# Patient Record
Sex: Female | Born: 1970 | Race: White | Hispanic: No | Marital: Married | Smoking: Never smoker
Health system: Southern US, Community
[De-identification: ages and names within clinical notes are randomized; demographics above are authoritative.]

## PROBLEM LIST (undated history)

## (undated) DIAGNOSIS — G629 Polyneuropathy, unspecified: Secondary | ICD-10-CM

## (undated) DIAGNOSIS — E669 Obesity, unspecified: Secondary | ICD-10-CM

## (undated) DIAGNOSIS — E785 Hyperlipidemia, unspecified: Secondary | ICD-10-CM

## (undated) DIAGNOSIS — G709 Myoneural disorder, unspecified: Secondary | ICD-10-CM

## (undated) DIAGNOSIS — B019 Varicella without complication: Secondary | ICD-10-CM

## (undated) DIAGNOSIS — Z0000 Encounter for general adult medical examination without abnormal findings: Secondary | ICD-10-CM

## (undated) DIAGNOSIS — J329 Chronic sinusitis, unspecified: Secondary | ICD-10-CM

## (undated) DIAGNOSIS — E782 Mixed hyperlipidemia: Secondary | ICD-10-CM

## (undated) DIAGNOSIS — E11319 Type 2 diabetes mellitus with unspecified diabetic retinopathy without macular edema: Secondary | ICD-10-CM

## (undated) DIAGNOSIS — J019 Acute sinusitis, unspecified: Secondary | ICD-10-CM

## (undated) DIAGNOSIS — T7840XA Allergy, unspecified, initial encounter: Secondary | ICD-10-CM

## (undated) DIAGNOSIS — N946 Dysmenorrhea, unspecified: Secondary | ICD-10-CM

## (undated) DIAGNOSIS — J45909 Unspecified asthma, uncomplicated: Secondary | ICD-10-CM

## (undated) DIAGNOSIS — I10 Essential (primary) hypertension: Secondary | ICD-10-CM

## (undated) HISTORY — DX: Myoneural disorder, unspecified: G70.9

## (undated) HISTORY — DX: Dysmenorrhea, unspecified: N94.6

## (undated) HISTORY — DX: Encounter for general adult medical examination without abnormal findings: Z00.00

## (undated) HISTORY — DX: Mixed hyperlipidemia: E78.2

## (undated) HISTORY — PX: EYE SURGERY: SHX253

## (undated) HISTORY — DX: Hyperlipidemia, unspecified: E78.5

## (undated) HISTORY — DX: Unspecified asthma, uncomplicated: J45.909

## (undated) HISTORY — DX: Obesity, unspecified: E66.9

## (undated) HISTORY — DX: Chronic sinusitis, unspecified: J32.9

## (undated) HISTORY — DX: Essential (primary) hypertension: I10

## (undated) HISTORY — DX: Acute sinusitis, unspecified: J01.90

## (undated) HISTORY — DX: Varicella without complication: B01.9

## (undated) HISTORY — DX: Allergy, unspecified, initial encounter: T78.40XA

## (undated) HISTORY — DX: Polyneuropathy, unspecified: G62.9

## (undated) HISTORY — DX: Type 2 diabetes mellitus with unspecified diabetic retinopathy without macular edema: E11.319

---

## 2011-03-04 ENCOUNTER — Emergency Department (HOSPITAL_BASED_OUTPATIENT_CLINIC_OR_DEPARTMENT_OTHER)
Admission: EM | Admit: 2011-03-04 | Discharge: 2011-03-05 | Disposition: A | Discharge status: Home / Self Care | Payer: BC Managed Care – PPO | Attending: Emergency Medicine | Admitting: Emergency Medicine

## 2011-03-04 DIAGNOSIS — R21 Rash and other nonspecific skin eruption: Secondary | ICD-10-CM | POA: Insufficient documentation

## 2011-03-04 LAB — DIFFERENTIAL
Eosinophils Absolute: 0.2 10*3/uL (ref 0.0–0.7)
Eosinophils Relative: 2 % (ref 0–5)
Lymphs Abs: 3.1 10*3/uL (ref 0.7–4.0)
Monocytes Absolute: 0.6 10*3/uL (ref 0.1–1.0)
Monocytes Relative: 7 % (ref 3–12)

## 2011-03-04 LAB — URINALYSIS, ROUTINE W REFLEX MICROSCOPIC
Glucose, UA: >1000 mg/dL — AB
Ketones, ur: NEGATIVE mg/dL
Leukocytes, UA: NEGATIVE
Protein, ur: NEGATIVE mg/dL
pH: 5.5 (ref 5.0–8.0)

## 2011-03-04 LAB — BASIC METABOLIC PANEL
BUN: 14 mg/dL (ref 6–23)
CO2: 28 mEq/L (ref 19–32)
Chloride: 101 mEq/L (ref 96–112)
Creatinine, Ser: 0.5 mg/dL (ref 0.4–1.2)
Glucose, Bld: 295 mg/dL — ABNORMAL HIGH (ref 70–99)
Potassium: 4.3 mEq/L (ref 3.5–5.1)

## 2011-03-04 LAB — CBC
HCT: 45.1 % (ref 36.0–46.0)
MCH: 30.8 pg (ref 26.0–34.0)
MCHC: 35.3 g/dL (ref 30.0–36.0)
MCV: 87.4 fL (ref 78.0–100.0)
Platelets: 253 10*3/uL (ref 150–400)
RDW: 11.9 % (ref 11.5–15.5)
WBC: 9.9 10*3/uL (ref 4.0–10.5)

## 2011-03-04 LAB — URINE MICROSCOPIC-ADD ON

## 2012-08-12 ENCOUNTER — Encounter (INDEPENDENT_AMBULATORY_CARE_PROVIDER_SITE_OTHER): Payer: BC Managed Care – PPO | Admitting: Ophthalmology

## 2012-08-12 DIAGNOSIS — E11319 Type 2 diabetes mellitus with unspecified diabetic retinopathy without macular edema: Secondary | ICD-10-CM

## 2012-08-12 DIAGNOSIS — H3581 Retinal edema: Secondary | ICD-10-CM

## 2012-08-12 DIAGNOSIS — H2510 Age-related nuclear cataract, unspecified eye: Secondary | ICD-10-CM

## 2012-08-12 DIAGNOSIS — E1139 Type 2 diabetes mellitus with other diabetic ophthalmic complication: Secondary | ICD-10-CM

## 2012-08-12 DIAGNOSIS — E1169 Type 2 diabetes mellitus with other specified complication: Secondary | ICD-10-CM | POA: Insufficient documentation

## 2012-08-12 DIAGNOSIS — E669 Obesity, unspecified: Secondary | ICD-10-CM | POA: Insufficient documentation

## 2012-08-12 DIAGNOSIS — H43819 Vitreous degeneration, unspecified eye: Secondary | ICD-10-CM

## 2012-08-12 HISTORY — PX: REFRACTIVE SURGERY: SHX103

## 2012-08-20 ENCOUNTER — Ambulatory Visit (INDEPENDENT_AMBULATORY_CARE_PROVIDER_SITE_OTHER): Payer: BC Managed Care – PPO | Admitting: Family Medicine

## 2012-08-20 ENCOUNTER — Encounter: Payer: Self-pay | Admitting: Family Medicine

## 2012-08-20 VITALS — BP 182/107 | HR 87 | Temp 97.8°F | Ht 67.5 in | Wt 261.0 lb

## 2012-08-20 DIAGNOSIS — N946 Dysmenorrhea, unspecified: Secondary | ICD-10-CM

## 2012-08-20 DIAGNOSIS — E1139 Type 2 diabetes mellitus with other diabetic ophthalmic complication: Secondary | ICD-10-CM

## 2012-08-20 DIAGNOSIS — H579 Unspecified disorder of eye and adnexa: Secondary | ICD-10-CM

## 2012-08-20 DIAGNOSIS — Z0000 Encounter for general adult medical examination without abnormal findings: Secondary | ICD-10-CM

## 2012-08-20 DIAGNOSIS — Z23 Encounter for immunization: Secondary | ICD-10-CM

## 2012-08-20 DIAGNOSIS — E119 Type 2 diabetes mellitus without complications: Secondary | ICD-10-CM

## 2012-08-20 DIAGNOSIS — E669 Obesity, unspecified: Secondary | ICD-10-CM | POA: Insufficient documentation

## 2012-08-20 DIAGNOSIS — I10 Essential (primary) hypertension: Secondary | ICD-10-CM | POA: Insufficient documentation

## 2012-08-20 DIAGNOSIS — T7840XA Allergy, unspecified, initial encounter: Secondary | ICD-10-CM | POA: Insufficient documentation

## 2012-08-20 DIAGNOSIS — E11319 Type 2 diabetes mellitus with unspecified diabetic retinopathy without macular edema: Secondary | ICD-10-CM

## 2012-08-20 HISTORY — DX: Dysmenorrhea, unspecified: N94.6

## 2012-08-20 HISTORY — DX: Essential (primary) hypertension: I10

## 2012-08-20 HISTORY — DX: Encounter for general adult medical examination without abnormal findings: Z00.00

## 2012-08-20 LAB — CBC
HCT: 44.9 % (ref 36.0–46.0)
Hemoglobin: 15 g/dL (ref 12.0–15.0)
RBC: 4.94 Mil/uL (ref 3.87–5.11)
RDW: 12.2 % (ref 11.5–14.6)
WBC: 7.5 10*3/uL (ref 4.5–10.5)

## 2012-08-20 LAB — HEMOGLOBIN A1C: Hgb A1c MFr Bld: 9.9 % — ABNORMAL HIGH (ref 4.6–6.5)

## 2012-08-20 LAB — HEPATIC FUNCTION PANEL
ALT: 26 U/L (ref 0–35)
AST: 27 U/L (ref 0–37)
Alkaline Phosphatase: 77 U/L (ref 39–117)
Total Bilirubin: 0.9 mg/dL (ref 0.3–1.2)

## 2012-08-20 LAB — RENAL FUNCTION PANEL
BUN: 7 mg/dL (ref 6–23)
CO2: 30 mEq/L (ref 19–32)
Calcium: 9.3 mg/dL (ref 8.4–10.5)
Chloride: 101 mEq/L (ref 96–112)
Creatinine, Ser: 0.6 mg/dL (ref 0.4–1.2)
GFR: 118.76 mL/min (ref >60.00)
Sodium: 138 mEq/L (ref 135–145)

## 2012-08-20 LAB — LIPID PANEL
HDL: 27.7 mg/dL — ABNORMAL LOW (ref >39.00)
LDL Cholesterol: 109 mg/dL — ABNORMAL HIGH (ref 0–99)
Total CHOL/HDL Ratio: 6
VLDL: 27.4 mg/dL (ref 0.0–40.0)

## 2012-08-20 LAB — MICROALBUMIN / CREATININE URINE RATIO: Microalb, Ur: 13 mg/dL — ABNORMAL HIGH (ref 0.0–1.9)

## 2012-08-20 MED ORDER — ONETOUCH LANCETS MISC: Status: DC

## 2012-08-20 MED ORDER — TETANUS-DIPHTH-ACELL PERTUSSIS 5-2.5-18.5 LF-MCG/0.5 IM SUSP: 0.5000 mL | Freq: Once | INTRAMUSCULAR | Status: DC

## 2012-08-20 MED ORDER — FLUTICASONE PROPIONATE 50 MCG/ACT NA SUSP: 2.0000 | Freq: Every day | NASAL | Status: DC

## 2012-08-20 MED ORDER — GLUCOSE BLOOD VI STRP: 1.0000 | ORAL_STRIP | Freq: Two times a day (BID) | Status: DC | PRN

## 2012-08-20 MED ORDER — ONETOUCH BASIC SYSTEM W/DEVICE KIT: PACK | Status: DC

## 2012-08-20 MED ORDER — CETIRIZINE HCL 10 MG PO TABS: 10.0000 mg | ORAL_TABLET | Freq: Every day | ORAL | Status: DC | PRN

## 2012-08-20 MED ORDER — METFORMIN HCL 500 MG PO TABS: 500.0000 mg | ORAL_TABLET | Freq: Two times a day (BID) | ORAL | Status: DC

## 2012-08-20 MED ORDER — LISINOPRIL 10 MG PO TABS: 10.0000 mg | ORAL_TABLET | Freq: Every day | ORAL | Status: DC

## 2012-08-20 MED ORDER — LANCETS 28G MISC: Status: DC

## 2012-08-20 NOTE — Assessment & Plan Note (Signed)
Start Lisinopril daily and reassess at next visit

## 2012-08-20 NOTE — Assessment & Plan Note (Signed)
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 She will continue her care there at [facility]

## 2012-08-20 NOTE — Addendum Note (Signed)
Addended by: Court Joy on: 08/20/2012 02:06 PM   Modules accepted: Orders

## 2012-08-20 NOTE — Assessment & Plan Note (Signed)
Patient has never been on meds or checked her sugars, she is given a meter and log today as well as Transport planner on diet and lifestyle she will monitor sugars, bis prior to breakfast and after dinner and prn for any concerning s/s. Check [id number] today and start Metformin 500 mg bid, minimize simple carbs.

## 2012-08-20 NOTE — Assessment & Plan Note (Signed)
Ibuprofen 400 mg prn menses

## 2012-08-20 NOTE — Assessment & Plan Note (Signed)
Started on Flonase and Zyrtec daily and encouraged nasal saline prn.

## 2012-08-20 NOTE — Assessment & Plan Note (Signed)
Given Tdap today, fasting labs ordered, MGM ordered. Pap scheduled for next visit. Has not had a pap since 41yo and no MGM

## 2012-08-20 NOTE — Assessment & Plan Note (Signed)
Consider DASH diet, increase exercise and avoid simple carbs.

## 2012-08-20 NOTE — Patient Instructions (Addendum)
Diabetes and Exercise Regular exercise is important and can help:   Control blood glucose (sugar).   Decrease blood pressure.    Control blood lipids (cholesterol, triglycerides).   Improve overall health.  BENEFITS FROM EXERCISE  Improved fitness.   Improved flexibility.   Improved endurance.   Increased bone density.   Weight control.   Increased muscle strength.   Decreased body fat.   Improvement of the body's use of insulin, a hormone.   Increased insulin sensitivity.   Reduction of insulin needs.   Reduced stress and tension.   Helps you feel better.  People with diabetes who add exercise to their lifestyle gain additional benefits, including:  Weight loss.   Reduced appetite.   Improvement of the body's use of blood glucose.   Decreased risk factors for heart disease:   Lowering of cholesterol and triglycerides.   Raising the level of good cholesterol (high-density lipoproteins, HDL).   Lowering blood sugar.   Decreased blood pressure.  TYPE 1 DIABETES AND EXERCISE  Exercise will usually lower your blood glucose.   If blood glucose is greater than 240 mg/dl, check urine ketones. If ketones are present, do not exercise.   Location of the insulin injection sites may need to be adjusted with exercise. Avoid injecting insulin into areas of the body that will be exercised. For example, avoid injecting insulin into:   The arms when playing tennis.   The legs when jogging. For more information, discuss this with your caregiver.   Keep a record of:   Food intake.   Type and amount of exercise.   Expected peak times of insulin action.   Blood glucose levels.  Do this before, during, and after exercise. Review your records with your caregiver. This will help you to develop guidelines for adjusting food intake and insulin amounts.  TYPE 2 DIABETES AND EXERCISE  Regular physical activity can help control blood glucose.   Exercise is important  because it may:   Increase the body's sensitivity to insulin.   Improve blood glucose control.   Exercise reduces the risk of heart disease. It decreases serum cholesterol and triglycerides. It also lowers blood pressure.   Those who take insulin or oral hypoglycemic agents should watch for signs of hypoglycemia. These signs include dizziness, shaking, sweating, chills, and confusion.   Body water is lost during exercise. It must be replaced. This will help to avoid loss of body fluids (dehydration) or heat stroke.  Be sure to talk to your caregiver before starting an exercise program to make sure it is safe for you. Remember, any activity is better than none.  Document Released: 03/06/2004 Document Revised: 12/04/2011 Document Reviewed: 06/21/2009 [facility] Patient Information 2012 [place], [place].  Preventive Care for Adults, Female A healthy lifestyle and preventive care can promote health and wellness. Preventive health guidelines for women include the following key practices.  A routine yearly physical is a good way to check with your caregiver about your health and preventive screening. It is a chance to share any concerns and updates on your health, and to receive a thorough exam.   Visit your dentist for a routine exam and preventive care every 6 months. Brush your teeth twice a day and floss once a day. Good oral hygiene prevents tooth decay and gum disease.   The frequency of eye exams is based on your age, health, family medical history, use of contact lenses, and other factors. Follow your caregiver's recommendations for frequency of eye exams.  Eat a healthy diet. Foods like vegetables, fruits, whole grains, low-fat dairy products, and lean protein foods contain the nutrients you need without too many calories. Decrease your intake of foods high in solid fats, added sugars, and salt. Eat the right amount of calories for you.Get information about a proper diet from your  caregiver, if necessary.   Regular physical exercise is one of the most important things you can do for your health. Most adults should get at least 150 minutes of moderate-intensity exercise (any activity that increases your heart rate and causes you to sweat) each week. In addition, most adults need muscle-strengthening exercises on 2 or more days a week.   Maintain a healthy weight. The body mass index (BMI) is a screening tool to identify possible weight problems. It provides an estimate of body fat based on height and weight. Your caregiver can help determine your BMI, and can help you achieve or maintain a healthy weight.For adults 20 years and older:   A BMI below 18.5 is considered underweight.   A BMI of 18.5 to 24.9 is normal.   A BMI of 25 to 29.9 is considered overweight.   A BMI of 30 and above is considered obese.   Maintain normal blood lipids and cholesterol levels by exercising and minimizing your intake of saturated fat. Eat a balanced diet with plenty of fruit and vegetables. Blood tests for lipids and cholesterol should begin at age 93 and be repeated every 5 years. If your lipid or cholesterol levels are high, you are over 50, or you are at high risk for heart disease, you may need your cholesterol levels checked more frequently.Ongoing high lipid and cholesterol levels should be treated with medicines if diet and exercise are not effective.   If you smoke, find out from your caregiver how to quit. If you do not use tobacco, do not start.   If you are pregnant, do not drink alcohol. If you are breastfeeding, be very cautious about drinking alcohol. If you are not pregnant and choose to drink alcohol, do not exceed 1 drink per day. One drink is considered to be 12 ounces (355 mL) of beer, 5 ounces (148 mL) of wine, or 1.5 ounces (44 mL) of liquor.   Avoid use of street drugs. Do not share needles with anyone. Ask for help if you need support or instructions about stopping  the use of drugs.   High blood pressure causes heart disease and increases the risk of stroke. Your blood pressure should be checked at least every 1 to 2 years. Ongoing high blood pressure should be treated with medicines if weight loss and exercise are not effective.   If you are 69 to 42 years old, ask your caregiver if you should take aspirin to prevent strokes.   Diabetes screening involves taking a blood sample to check your fasting blood sugar level. This should be done once every 3 years, after age 63, if you are within normal weight and without risk factors for diabetes. Testing should be considered at a younger age or be carried out more frequently if you are overweight and have at least 1 risk factor for diabetes.   Breast cancer screening is essential preventive care for women. You should practice "breast self-awareness." This means understanding the normal appearance and feel of your breasts and may include breast self-examination. Any changes detected, no matter how small, should be reported to a caregiver. Women in their 3s and 30s should have a  clinical breast exam (CBE) by a caregiver as part of a regular health exam every 1 to 3 years. After age 47, women should have a CBE every year. Starting at age 57, women should consider having a mammography (breast X-ray test) every year. Women who have a family history of breast cancer should talk to their caregiver about genetic screening. Women at a high risk of breast cancer should talk to their caregivers about having magnetic resonance imaging (MRI) and a mammography every year.   The Pap test is a screening test for cervical cancer. A Pap test can show cell changes on the cervix that might become cervical cancer if left untreated. A Pap test is a procedure in which cells are obtained and examined from the lower end of the uterus (cervix).   Women should have a Pap test starting at age 72.   Between ages 78 and 35, Pap tests should be  repeated every 2 years.   Beginning at age 85, you should have a Pap test every 3 years as long as the past 3 Pap tests have been normal.   Some women have medical problems that increase the chance of getting cervical cancer. Talk to your caregiver about these problems. It is especially important to talk to your caregiver if a new problem develops soon after your last Pap test. In these cases, your caregiver may recommend more frequent screening and Pap tests.   The above recommendations are the same for women who have or have not gotten the vaccine for human papillomavirus (HPV).   If you had a hysterectomy for a problem that was not cancer or a condition that could lead to cancer, then you no longer need Pap tests. Even if you no longer need a Pap test, a regular exam is a good idea to make sure no other problems are starting.   If you are between ages 48 and 30, and you have had normal Pap tests going back 10 years, you no longer need Pap tests. Even if you no longer need a Pap test, a regular exam is a good idea to make sure no other problems are starting.   If you have had past treatment for cervical cancer or a condition that could lead to cancer, you need Pap tests and screening for cancer for at least 20 years after your treatment.   If Pap tests have been discontinued, risk factors (such as a new sexual partner) need to be reassessed to determine if screening should be resumed.   The HPV test is an additional test that may be used for cervical cancer screening. The HPV test looks for the virus that can cause the cell changes on the cervix. The cells collected during the Pap test can be tested for HPV. The HPV test could be used to screen women aged 58 years and older, and should be used in women of any age who have unclear Pap test results. After the age of 1, women should have HPV testing at the same frequency as a Pap test.   Colorectal cancer can be detected and often prevented. Most  routine colorectal cancer screening begins at the age of 68 and continues through age 16. However, your caregiver may recommend screening at an earlier age if you have risk factors for colon cancer. On a yearly basis, your caregiver may provide home test kits to check for hidden blood in the stool. Use of a small camera at the end of a tube,  to directly examine the colon (sigmoidoscopy or colonoscopy), can detect the earliest forms of colorectal cancer. Talk to your caregiver about this at age 27, when routine screening begins. Direct examination of the colon should be repeated every 5 to 10 years through age 45, unless early forms of pre-cancerous polyps or small growths are found.   Hepatitis C blood testing is recommended for all people born from [birth year] through 1965 and any individual with known risks for hepatitis C.   Practice safe sex. Use condoms and avoid high-risk sexual practices to reduce the spread of sexually transmitted infections (STIs). STIs include gonorrhea, chlamydia, syphilis, trichomonas, herpes, HPV, and human immunodeficiency virus (HIV). Herpes, HIV, and HPV are viral illnesses that have no cure. They can result in disability, cancer, and death. Sexually active women aged 78 and younger should be checked for chlamydia. Older women with new or multiple partners should also be tested for chlamydia. Testing for other STIs is recommended if you are sexually active and at increased risk.   Osteoporosis is a disease in which the bones lose minerals and strength with aging. This can result in serious bone fractures. The risk of osteoporosis can be identified using a bone density scan. Women ages 2 and over and women at risk for fractures or osteoporosis should discuss screening with their caregivers. Ask your caregiver whether you should take a calcium supplement or vitamin D to reduce the rate of osteoporosis.   Menopause can be associated with physical symptoms and risks. Hormone  replacement therapy is available to decrease symptoms and risks. You should talk to your caregiver about whether hormone replacement therapy is right for you.   Use sunscreen with sun protection factor (SPF) of 30 or more. Apply sunscreen liberally and repeatedly throughout the day. You should seek shade when your shadow is shorter than you. Protect yourself by wearing long sleeves, pants, a wide-brimmed hat, and sunglasses year round, whenever you are outdoors.   Once a month, do a whole body skin exam, using a mirror to look at the skin on your back. Notify your caregiver of new moles, moles that have irregular borders, moles that are larger than a pencil eraser, or moles that have changed in shape or color.   Stay current with required immunizations.   Influenza. You need a dose every fall (or winter). The composition of the flu vaccine changes each year, so being vaccinated once is not enough.   Pneumococcal polysaccharide. You need 1 to 2 doses if you smoke cigarettes or if you have certain chronic medical conditions. You need 1 dose at age 36 (or older) if you have never been vaccinated.   Tetanus, diphtheria, pertussis (Tdap, Td). Get 1 dose of Tdap vaccine if you are younger than age 44, are over 53 and have contact with an infant, are a Research scientist (physical sciences), are pregnant, or simply want to be protected from whooping cough. After that, you need a Td booster dose every 10 years. Consult your caregiver if you have not had at least 3 tetanus and diphtheria-containing shots sometime in your life or have a deep or dirty wound.   HPV. You need this vaccine if you are a woman age 23 or younger. The vaccine is given in 3 doses over 6 months.   Measles, mumps, rubella (MMR). You need at least 1 dose of MMR if you were born in 1957 or later. You may also need a second dose.   Meningococcal. If you are age 36 to  21 and a Orthoptist living in a residence hall, or have one of several  medical conditions, you need to get vaccinated against meningococcal disease. You may also need additional booster doses.   Zoster (shingles). If you are age 29 or older, you should get this vaccine.   Varicella (chickenpox). If you have never had chickenpox or you were vaccinated but received only 1 dose, talk to your caregiver to find out if you need this vaccine.   Hepatitis A. You need this vaccine if you have a specific risk factor for hepatitis A virus infection or you simply wish to be protected from this disease. The vaccine is usually given as 2 doses, 6 to 18 months apart.   Hepatitis B. You need this vaccine if you have a specific risk factor for hepatitis B virus infection or you simply wish to be protected from this disease. The vaccine is given in 3 doses, usually over 6 months.  Preventive Services / Frequency Ages 73 to 52  Blood pressure check.** / Every 1 to 2 years.   Lipid and cholesterol check.** / Every 5 years beginning at age 37.   Clinical breast exam.** / Every 3 years for women in their 63s and 30s.   Pap test.** / Every 2 years from ages 71 through 78. Every 3 years starting at age 86 through age 57 or 18 with a history of 3 consecutive normal Pap tests.   HPV screening.** / Every 3 years from ages 1 through ages 49 to 80 with a history of 3 consecutive normal Pap tests.   Hepatitis C blood test.** / For any individual with known risks for hepatitis C.   Skin self-exam. / Monthly.   Influenza immunization.** / Every year.   Pneumococcal polysaccharide immunization.** / 1 to 2 doses if you smoke cigarettes or if you have certain chronic medical conditions.   Tetanus, diphtheria, pertussis (Tdap, Td) immunization. / A one-time dose of Tdap vaccine. After that, you need a Td booster dose every 10 years.   HPV immunization. / 3 doses over 6 months, if you are 40 and younger.   Measles, mumps, rubella (MMR) immunization. / You need at least 1 dose of MMR if  you were born in 1957 or later. You may also need a second dose.   Meningococcal immunization. / 1 dose if you are age 65 to 40 and a first-year college student living in a residence hall, or have one of several medical conditions, you need to get vaccinated against meningococcal disease. You may also need additional booster doses.   Varicella immunization.** / Consult your caregiver.   Hepatitis A immunization.** / Consult your caregiver. 2 doses, 6 to 18 months apart.   Hepatitis B immunization.** / Consult your caregiver. 3 doses usually over 6 months.  Ages 64 to 39  Blood pressure check.** / Every 1 to 2 years.   Lipid and cholesterol check.** / Every 5 years beginning at age 3.   Clinical breast exam.** / Every year after age 77.   Mammogram.** / Every year beginning at age 11 and continuing for as long as you are in good health. Consult with your caregiver.   Pap test.** / Every 3 years starting at age 8 through age 78 or 88 with a history of 3 consecutive normal Pap tests.   HPV screening.** / Every 3 years from ages 26 through ages 86 to 20 with a history of 3 consecutive normal Pap tests.  Fecal occult blood test (FOBT) of stool. / Every year beginning at age 21 and continuing until age 67. You may not need to do this test if you get a colonoscopy every 10 years.   Flexible sigmoidoscopy or colonoscopy.** / Every 5 years for a flexible sigmoidoscopy or every 10 years for a colonoscopy beginning at age 46 and continuing until age 48.   Hepatitis C blood test.** / For all people born from [birth year] through 1966 and any individual with known risks for hepatitis C.   Skin self-exam. / Monthly.   Influenza immunization.** / Every year.   Pneumococcal polysaccharide immunization.** / 1 to 2 doses if you smoke cigarettes or if you have certain chronic medical conditions.   Tetanus, diphtheria, pertussis (Tdap, Td) immunization.** / A one-time dose of Tdap vaccine. After that, you  need a Td booster dose every 10 years.   Measles, mumps, rubella (MMR) immunization. / You need at least 1 dose of MMR if you were born in 1957 or later. You may also need a second dose.   Varicella immunization.** / Consult your caregiver.   Meningococcal immunization.** / Consult your caregiver.   Hepatitis A immunization.** / Consult your caregiver. 2 doses, 6 to 18 months apart.   Hepatitis B immunization.** / Consult your caregiver. 3 doses, usually over 6 months.  Ages 55 and over  Blood pressure check.** / Every 1 to 2 years.   Lipid and cholesterol check.** / Every 5 years beginning at age 95.   Clinical breast exam.** / Every year after age 61.   Mammogram.** / Every year beginning at age 75 and continuing for as long as you are in good health. Consult with your caregiver.   Pap test.** / Every 3 years starting at age 71 through age 29 or 15 with a 3 consecutive normal Pap tests. Testing can be stopped between 65 and 70 with 3 consecutive normal Pap tests and no abnormal Pap or HPV tests in the past 10 years.   HPV screening.** / Every 3 years from ages 18 through ages 34 or 76 with a history of 3 consecutive normal Pap tests. Testing can be stopped between 65 and 70 with 3 consecutive normal Pap tests and no abnormal Pap or HPV tests in the past 10 years.   Fecal occult blood test (FOBT) of stool. / Every year beginning at age 55 and continuing until age 74. You may not need to do this test if you get a colonoscopy every 10 years.   Flexible sigmoidoscopy or colonoscopy.** / Every 5 years for a flexible sigmoidoscopy or every 10 years for a colonoscopy beginning at age 67 and continuing until age 83.   Hepatitis C blood test.** / For all people born from [birth year] through 1966 and any individual with known risks for hepatitis C.   Osteoporosis screening.** / A one-time screening for women ages 28 and over and women at risk for fractures or osteoporosis.   Skin self-exam. /  Monthly.   Influenza immunization.** / Every year.   Pneumococcal polysaccharide immunization.** / 1 dose at age 80 (or older) if you have never been vaccinated.   Tetanus, diphtheria, pertussis (Tdap, Td) immunization. / A one-time dose of Tdap vaccine if you are over 65 and have contact with an infant, are a Research scientist (physical sciences), or simply want to be protected from whooping cough. After that, you need a Td booster dose every 10 years.   Varicella immunization.** / Consult your caregiver.  Meningococcal immunization.** / Consult your caregiver.   Hepatitis A immunization.** / Consult your caregiver. 2 doses, 6 to 18 months apart.   Hepatitis B immunization.** / Check with your caregiver. 3 doses, usually over 6 months.  ** Family history and personal history of risk and conditions may change your caregiver's recommendations. Document Released: 02/10/2002 Document Revised: 12/04/2011 Document Reviewed: 05/12/2011 [facility] Patient Information 2012 [place], [place].

## 2012-08-20 NOTE — Progress Notes (Signed)
Patient ID: April Warren, female   DOB: 1971-05-11, 41 y.o.   MRN: [id number] April Warren [id number] 1971-04-07 08/20/2012      Progress Note New Patient  Subjective  Chief Complaint  Chief Complaint  Patient presents with  . Establish Care    new patient- recently diagnosed w/diabetes    HPI  Patient is a 41 year old Caucasian female who is in today to establish care. She works at [employer] in [place] and they were doing her practice exam on her when they discovered she had diabetic retinopathy. She is now working with Dr. Alan Mulder to have her eyes appeared and is here today to establish care began to monitor her sugar. She has not seen a doctor in many years and has never had a mammogram. She's never had a Pap smear since age 25. She just finished her menstrual cycle 8 days but had not closed with your since her last one before this most recent episode. She does have body habitus suggestive of the PCOS. She denies any recent illness, fevers, chills, chest pain or palpitation, shortness of breath, GI or GU complaints at this time. Has been told her blood pressures been up at times. Review of her chart reveals her sugar was 295 in the ER in March of 2012 but she does report she was on prednisone at that time.  Past Medical History  Diagnosis Date  . Diabetes mellitus 08-12-12    type 2- dr Ashley Royalty diagnosed  . Chicken pox 41 yrs old  . Diabetic retinopathy   . Obesity   . HTN (hypertension) 08/20/2012  . Allergy   . Preventative health care 08/20/2012    Past Surgical History  Procedure Date  . Refractive surgery 08-12-12    right, left on 09/16/12    Family History  Problem Relation Age of Onset  . Cancer Mother 50    breast  . Diabetes Mother     type 2  . Hypertension Mother   . Hypertension Father   . Hyperlipidemia Father   . COPD Father   . Hypertension Maternal Grandmother   . Hyperlipidemia Maternal Grandmother   . COPD Maternal  Grandmother   . Asthma Maternal Grandmother   . Hypertension Maternal Grandfather   . Heart attack Maternal Grandfather   . Gout Brother   . Cancer Brother     lung  . Heart disease Paternal Grandfather     History   Social History  . Marital Status: Married    Spouse Name: N/A    Number of Children: N/A  . Years of Education: N/A   Occupational History  . Not on file.   Social History Main Topics  . Smoking status: Never Smoker   . Smokeless tobacco: Never Used  . Alcohol Use: Yes     socially  . Drug Use: No  . Sexually Active: Yes -- Female partner(s)   Other Topics Concern  . Not on file   Social History Narrative  . No narrative on file    Current Outpatient Prescriptions on File Prior to Visit  Medication Sig Dispense Refill  . cetirizine (ZYRTEC ALLERGY) 10 MG tablet Take 1 tablet (10 mg total) by mouth daily as needed for allergies.      . fluticasone (FLONASE) 50 MCG/ACT nasal spray Place 2 sprays into the nose daily.  16 g  6  . lisinopril (PRINIVIL,ZESTRIL) 10 MG tablet Take 1 tablet (10 mg total) by mouth daily.  90  tablet  3  . metFORMIN (GLUCOPHAGE) 500 MG tablet Take 1 tablet (500 mg total) by mouth 2 (two) times daily with a meal.  180 tablet  3   No current facility-administered medications on file prior to visit.    No Known Allergies  Review of Systems  Review of Systems  Constitutional: Negative for fever, chills and malaise/fatigue.  HENT: Negative for hearing loss, nosebleeds and congestion.   Eyes: Negative for discharge.  Respiratory: Negative for cough, sputum production, shortness of breath and wheezing.   Cardiovascular: Negative for chest pain, palpitations and leg swelling.  Gastrointestinal: Negative for heartburn, nausea, vomiting, abdominal pain, diarrhea, constipation and blood in stool.  Genitourinary: Negative for dysuria, urgency, frequency and hematuria.  Musculoskeletal: Negative for myalgias, back pain and falls.  Skin:  Negative for rash.  Neurological: Negative for dizziness, tremors, sensory change, focal weakness, loss of consciousness, weakness and headaches.  Endo/Heme/Allergies: Negative for polydipsia. Does not bruise/bleed easily.  Psychiatric/Behavioral: Negative for depression and suicidal ideas. The patient is nervous/anxious. The patient does not have insomnia.        Anxious about diagnosis of DM    Objective  BP 182/107  Pulse 87  Temp 97.8 F (36.6 C) (Temporal)  Ht 5' 7.5" (1.715 m)  Wt 261 lb (118.389 kg)  BMI 40.28 kg/m2  SpO2 98%  LMP 08/11/2012  Physical Exam  Physical Exam  Constitutional: She is oriented to person, place, and time and well-developed, well-nourished, and in no distress. No distress.  HENT:  Head: Normocephalic and atraumatic.  Right Ear: External ear normal.  Left Ear: External ear normal.  Nose: Nose normal.  Mouth/Throat: Oropharynx is clear and moist. No oropharyngeal exudate.  Eyes: Conjunctivae are normal. Pupils are equal, round, and reactive to light. Right eye exhibits no discharge. Left eye exhibits no discharge. No scleral icterus.  Neck: Normal range of motion. Neck supple. No thyromegaly present.  Cardiovascular: Normal rate, regular rhythm, normal heart sounds and intact distal pulses.   No murmur heard. Pulmonary/Chest: Effort normal and breath sounds normal. No respiratory distress. She has no wheezes. She has no rales.  Abdominal: Soft. Bowel sounds are normal. She exhibits no distension and no mass. There is no tenderness.  Musculoskeletal: Normal range of motion. She exhibits no edema and no tenderness.  Lymphadenopathy:    She has no cervical adenopathy.  Neurological: She is alert and oriented to person, place, and time. She has normal reflexes. No cranial nerve deficit. Coordination normal.  Skin: Skin is warm and dry. No rash noted. She is not diaphoretic.  Psychiatric: Mood, memory and affect normal.       Assessment &  Plan  Allergic state Started on Flonase and Zyrtec daily and encouraged nasal saline prn.  Diabetes mellitus Patient has never been on meds or checked her sugars, she is given a meter and log today as well as Transport planner on diet and lifestyle she will monitor sugars, bis prior to breakfast and after dinner and prn for any concerning s/s. Check hgba1c today and start Metformin 500 mg bid, minimize simple carbs.   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 She will continue her care there at [facility]  HTN (hypertension) Start Lisinopril daily and reassess at next visit  Obesity Consider DASH diet, increase exercise and avoid simple carbs.  Preventative health care Given Tdap  today, fasting labs ordered, MGM ordered. Pap scheduled for next visit. Has not had a pap since 41yo and no MGM  Dysmenorrhea Ibuprofen 400 mg prn menses

## 2012-09-13 ENCOUNTER — Encounter (INDEPENDENT_AMBULATORY_CARE_PROVIDER_SITE_OTHER): Payer: BC Managed Care – PPO | Admitting: Ophthalmology

## 2012-09-13 DIAGNOSIS — H3581 Retinal edema: Secondary | ICD-10-CM

## 2012-09-13 DIAGNOSIS — E1165 Type 2 diabetes mellitus with hyperglycemia: Secondary | ICD-10-CM

## 2012-09-22 ENCOUNTER — Ambulatory Visit: Payer: BC Managed Care – PPO | Admitting: Family Medicine

## 2013-01-13 ENCOUNTER — Ambulatory Visit (INDEPENDENT_AMBULATORY_CARE_PROVIDER_SITE_OTHER): Admitting: Ophthalmology

## 2013-01-13 DIAGNOSIS — H43819 Vitreous degeneration, unspecified eye: Secondary | ICD-10-CM

## 2013-01-13 DIAGNOSIS — H2510 Age-related nuclear cataract, unspecified eye: Secondary | ICD-10-CM

## 2013-01-13 DIAGNOSIS — E1139 Type 2 diabetes mellitus with other diabetic ophthalmic complication: Secondary | ICD-10-CM

## 2013-01-13 DIAGNOSIS — E11319 Type 2 diabetes mellitus with unspecified diabetic retinopathy without macular edema: Secondary | ICD-10-CM

## 2013-02-12 ENCOUNTER — Other Ambulatory Visit: Payer: Self-pay

## 2013-07-07 ENCOUNTER — Ambulatory Visit (INDEPENDENT_AMBULATORY_CARE_PROVIDER_SITE_OTHER): Payer: Self-pay | Admitting: Ophthalmology

## 2013-07-13 ENCOUNTER — Ambulatory Visit (INDEPENDENT_AMBULATORY_CARE_PROVIDER_SITE_OTHER): Admitting: Ophthalmology

## 2013-08-05 ENCOUNTER — Ambulatory Visit (INDEPENDENT_AMBULATORY_CARE_PROVIDER_SITE_OTHER): Payer: TRICARE For Life (TFL) | Admitting: Ophthalmology

## 2013-08-05 DIAGNOSIS — I10 Essential (primary) hypertension: Secondary | ICD-10-CM

## 2013-08-05 DIAGNOSIS — H2510 Age-related nuclear cataract, unspecified eye: Secondary | ICD-10-CM

## 2013-08-05 DIAGNOSIS — E1165 Type 2 diabetes mellitus with hyperglycemia: Secondary | ICD-10-CM

## 2013-08-05 DIAGNOSIS — E1139 Type 2 diabetes mellitus with other diabetic ophthalmic complication: Secondary | ICD-10-CM

## 2013-08-05 DIAGNOSIS — H43819 Vitreous degeneration, unspecified eye: Secondary | ICD-10-CM

## 2013-08-05 DIAGNOSIS — E11319 Type 2 diabetes mellitus with unspecified diabetic retinopathy without macular edema: Secondary | ICD-10-CM

## 2013-08-05 DIAGNOSIS — H35039 Hypertensive retinopathy, unspecified eye: Secondary | ICD-10-CM

## 2013-08-09 ENCOUNTER — Telehealth: Payer: Self-pay | Admitting: Family Medicine

## 2013-08-09 DIAGNOSIS — I10 Essential (primary) hypertension: Secondary | ICD-10-CM

## 2013-08-09 DIAGNOSIS — E119 Type 2 diabetes mellitus without complications: Secondary | ICD-10-CM

## 2013-08-09 MED ORDER — LISINOPRIL 10 MG PO TABS: 10.0000 mg | ORAL_TABLET | Freq: Every day | ORAL | Status: DC

## 2013-08-09 MED ORDER — METFORMIN HCL 500 MG PO TABS: 500.0000 mg | ORAL_TABLET | Freq: Two times a day (BID) | ORAL | Status: DC

## 2013-08-09 NOTE — Telephone Encounter (Signed)
Refill- lisinopril 10mg  tablet. Take one tablet by mouth daily. Qty 90 last fill 7.15.14  Refill- metformin hcl 500mg  tablet. Take one tablet by mouth two times daily with a meal. Qty 180 last fill 7.15.14

## 2013-08-25 ENCOUNTER — Encounter: Payer: Self-pay | Admitting: Family Medicine

## 2013-08-25 ENCOUNTER — Telehealth: Payer: Self-pay | Admitting: Family Medicine

## 2013-08-25 ENCOUNTER — Ambulatory Visit (INDEPENDENT_AMBULATORY_CARE_PROVIDER_SITE_OTHER): Admitting: Family Medicine

## 2013-08-25 VITALS — BP 162/94 | HR 96 | Temp 98.0°F | Ht 67.5 in | Wt 276.0 lb

## 2013-08-25 DIAGNOSIS — N760 Acute vaginitis: Secondary | ICD-10-CM

## 2013-08-25 DIAGNOSIS — I10 Essential (primary) hypertension: Secondary | ICD-10-CM

## 2013-08-25 DIAGNOSIS — E119 Type 2 diabetes mellitus without complications: Secondary | ICD-10-CM

## 2013-08-25 DIAGNOSIS — Z0000 Encounter for general adult medical examination without abnormal findings: Secondary | ICD-10-CM

## 2013-08-25 DIAGNOSIS — E669 Obesity, unspecified: Secondary | ICD-10-CM

## 2013-08-25 DIAGNOSIS — H6691 Otitis media, unspecified, right ear: Secondary | ICD-10-CM

## 2013-08-25 DIAGNOSIS — H6690 Otitis media, unspecified, unspecified ear: Secondary | ICD-10-CM

## 2013-08-25 DIAGNOSIS — E785 Hyperlipidemia, unspecified: Secondary | ICD-10-CM

## 2013-08-25 MED ORDER — LISINOPRIL 10 MG PO TABS: 10.0000 mg | ORAL_TABLET | Freq: Two times a day (BID) | ORAL | Status: DC

## 2013-08-25 MED ORDER — FLUCONAZOLE 150 MG PO TABS: 150.0000 mg | ORAL_TABLET | ORAL | Status: DC

## 2013-08-25 MED ORDER — AMOXICILLIN-POT CLAVULANATE 875-125 MG PO TABS: 1.0000 | ORAL_TABLET | Freq: Two times a day (BID) | ORAL | Status: DC

## 2013-08-25 NOTE — Patient Instructions (Addendum)
  Start a probiotic such as Digestive Advantage Plain Mucinex 600 mg twice a day for 14 days   DASH Diet The DASH diet stands for "Dietary Approaches to Stop Hypertension." It is a healthy eating plan that has been shown to reduce high blood pressure (hypertension) in as little as 14 days, while also possibly providing other significant health benefits. These other health benefits include reducing the risk of breast cancer after menopause and reducing the risk of type 2 diabetes, heart disease, colon cancer, and stroke. Health benefits also include weight loss and slowing kidney failure in patients with chronic kidney disease.  DIET GUIDELINES  Limit salt (sodium). Your diet should contain less than 1500 mg of sodium daily.  Limit refined or processed carbohydrates. Your diet should include mostly whole grains. Desserts and added sugars should be used sparingly.  Include small amounts of heart-healthy fats. These types of fats include nuts, oils, and tub margarine. Limit saturated and trans fats. These fats have been shown to be harmful in the body. CHOOSING FOODS  The following food groups are based on a 2000 calorie diet. See your Registered Dietitian for individual calorie needs. Grains and Grain Products (6 to 8 servings daily)  Eat More Often: Whole-wheat bread, brown rice, whole-grain or wheat pasta, quinoa, popcorn without added fat or salt (air popped).  Eat Less Often: White bread, white pasta, white rice, cornbread. Vegetables (4 to 5 servings daily)  Eat More Often: Fresh, frozen, and canned vegetables. Vegetables may be raw, steamed, roasted, or grilled with a minimal amount of fat.  Eat Less Often/Avoid: Creamed or fried vegetables. Vegetables in a cheese sauce. Fruit (4 to 5 servings daily)  Eat More Often: All fresh, canned (in natural juice), or frozen fruits. Dried fruits without added sugar. One hundred percent fruit juice ( cup [237 mL] daily).  Eat Less Often: Dried  fruits with added sugar. Canned fruit in light or heavy syrup. [employer], Fish, and Poultry (2 servings or less daily. One serving is 3 to 4 oz [85-114 g]).  Eat More Often: Ninety percent or leaner ground beef, tenderloin, sirloin. Round cuts of beef, chicken breast, [place] breast. All fish. Grill, bake, or broil your meat. Nothing should be fried.  Eat Less Often/Avoid: Fatty cuts of meat, [place], or chicken leg, thigh, or wing. Fried cuts of meat or fish. Dairy (2 to 3 servings)  Eat More Often: Low-fat or fat-free milk, low-fat plain or light yogurt, reduced-fat or part-skim cheese.  Eat Less Often/Avoid: Milk (whole, 2%).Whole milk yogurt. Full-fat cheeses. Nuts, Seeds, and Legumes (4 to 5 servings per week)  Eat More Often: All without added salt.  Eat Less Often/Avoid: Salted nuts and seeds, canned beans with added salt. Fats and Sweets (limited)  Eat More Often: Vegetable oils, tub margarines without trans fats, sugar-free gelatin. Mayonnaise and salad dressings.  Eat Less Often/Avoid: Coconut oils, palm oils, butter, stick margarine, cream, half and half, cookies, candy, pie. FOR MORE INFORMATION The Dash Diet Eating Plan: www.dashdiet.org Document Released: 12/04/2011 Document Revised: 03/08/2012 Document Reviewed: 12/04/2011 [facility] Patient Information 2014 [place], [place].

## 2013-08-25 NOTE — Telephone Encounter (Signed)
LAB ORDER WEEK OF 11-07-2013 Check out comments: Next visit annual gyn with labs prior lipid, renal, cbc, tsh, hepatic, hgba1c

## 2013-08-26 ENCOUNTER — Telehealth: Payer: Self-pay | Admitting: *Deleted

## 2013-08-26 LAB — LIPID PANEL
HDL: 31 mg/dL — ABNORMAL LOW (ref >39)
LDL Cholesterol: 96 mg/dL (ref 0–99)
Total CHOL/HDL Ratio: 5.5 Ratio
Triglycerides: 227 mg/dL — ABNORMAL HIGH (ref <150)
VLDL: 45 mg/dL — ABNORMAL HIGH (ref 0–40)

## 2013-08-26 LAB — HEPATIC FUNCTION PANEL
Albumin: 4 g/dL (ref 3.5–5.2)
Alkaline Phosphatase: 74 U/L (ref 39–117)
Total Bilirubin: 0.7 mg/dL (ref 0.3–1.2)
Total Protein: 7.4 g/dL (ref 6.0–8.3)

## 2013-08-26 LAB — RENAL FUNCTION PANEL
Chloride: 99 mEq/L (ref 96–112)
Creat: 0.56 mg/dL (ref 0.50–1.10)
Phosphorus: 2.6 mg/dL (ref 2.3–4.6)
Potassium: 4.2 mEq/L (ref 3.5–5.3)
Sodium: 134 mEq/L — ABNORMAL LOW (ref 135–145)

## 2013-08-26 LAB — TSH: TSH: 0.758 u[IU]/mL (ref 0.350–4.500)

## 2013-08-26 LAB — CBC
Hemoglobin: 14.7 g/dL (ref 12.0–15.0)
MCHC: 34.1 g/dL (ref 30.0–36.0)
WBC: 8.8 10*3/uL (ref 4.0–10.5)

## 2013-08-26 MED ORDER — ATORVASTATIN CALCIUM 10 MG PO TABS: 10.0000 mg | ORAL_TABLET | Freq: Every day | ORAL | Status: DC

## 2013-08-26 NOTE — Telephone Encounter (Signed)
Message copied by Marlene Lard on Fri Aug 26, 2013  3:49 PM ------      Message from: Danise Edge A      Created: Fri Aug 26, 2013 12:41 PM       So sugar much better would still like it below 7 but will not change Metformin for now, just watch the carbs and increase activity more important is the cholesterol is up some, we should start Atorvastatin 10 mg daily, disp #30 with 3 rf if she agrees ------

## 2013-08-28 ENCOUNTER — Encounter: Payer: Self-pay | Admitting: Family Medicine

## 2013-08-28 DIAGNOSIS — E782 Mixed hyperlipidemia: Secondary | ICD-10-CM

## 2013-08-28 DIAGNOSIS — E785 Hyperlipidemia, unspecified: Secondary | ICD-10-CM

## 2013-08-28 DIAGNOSIS — E669 Obesity, unspecified: Secondary | ICD-10-CM

## 2013-08-28 HISTORY — DX: Mixed hyperlipidemia: E78.2

## 2013-08-28 HISTORY — DX: Obesity, unspecified: E66.9

## 2013-08-28 HISTORY — DX: Hyperlipidemia, unspecified: E78.5

## 2013-08-28 NOTE — Assessment & Plan Note (Signed)
Avoid trans fats, start Atorvastatin, start Krill oil caps.

## 2013-08-28 NOTE — Assessment & Plan Note (Signed)
hgba1c greatly improved from lastt year and not taking Metformin. Will restart Metformin and avoid simple carbs.

## 2013-08-28 NOTE — Assessment & Plan Note (Signed)
Encouraged DASH diet, increase exercise 

## 2013-08-28 NOTE — Assessment & Plan Note (Signed)
Patient not in for over a year without bp meds. Will have her restart and return for recheck, encouraged DASH diet

## 2013-08-28 NOTE — Progress Notes (Signed)
Patient ID: April Warren, female   DOB: 1971-02-08, 42 y.o.   MRN: [id number] April Warren [id number] 1971/05/09 08/28/2013      Progress Note-Follow Up  Subjective  Chief Complaint  Chief Complaint  Patient presents with  . Follow-up    med refill    HPI  Patient is a 42 year old Caucasian female who is not in over a year. I percent last year she's had a great deal of stress. Her elderly father his inhale her husbands she has lost her job. She has not been taking her medications and notes her blood sugars been running above 150 but she has been trying to eat better. She denies recent illness except for some mild congestion. She's had a sore throat but no fevers or chills. No chest pain no palpitations, shortness of breath, reflux, GI or GU concerns otherwise noted. Overall she thinks she has been handling her stress well  Past Medical History  Diagnosis Date  . Diabetes mellitus 08-12-12    type 2- dr Ashley Royalty diagnosed  . Chicken pox 42 yrs old  . Diabetic retinopathy   . Obesity   . HTN (hypertension) 08/20/2012  . Allergy   . Preventative health care 08/20/2012  . Dysmenorrhea 08/20/2012  . Other and unspecified hyperlipidemia 08/28/2013    Past Surgical History  Procedure Laterality Date  . Refractive surgery  08-12-12    right, left on 09/16/12    Family History  Problem Relation Age of Onset  . Cancer Mother 25    breast  . Diabetes Mother     type 2  . Hypertension Mother   . Hypertension Father   . Hyperlipidemia Father   . COPD Father   . Hypertension Maternal Grandmother   . Hyperlipidemia Maternal Grandmother   . COPD Maternal Grandmother   . Asthma Maternal Grandmother   . Hypertension Maternal Grandfather   . Heart attack Maternal Grandfather   . Gout Brother   . Cancer Brother     lung  . Heart disease Paternal Grandfather     History   Social History  . Marital Status: Married    Spouse Name: N/A    Number of Children: N/A  . Years of Education:  N/A   Occupational History  . Not on file.   Social History Main Topics  . Smoking status: Never Smoker   . Smokeless tobacco: Never Used  . Alcohol Use: Yes     Comment: socially  . Drug Use: No  . Sexual Activity: Yes    Partners: Male   Other Topics Concern  . Not on file   Social History Narrative  . No narrative on file    Current Outpatient Prescriptions on File Prior to Visit  Medication Sig Dispense Refill  . Blood Glucose Monitoring Suppl (ONE TOUCH BASIC SYSTEM) W/DEVICE KIT Test bid prn  1 each  0  . glucose blood (ONE TOUCH TEST STRIPS) test strip 1 each by Other route 2 (two) times daily as needed for other.  100 each  12  . ibuprofen (ADVIL,MOTRIN) 200 MG tablet Take 400 mg by mouth every 8 (eight) hours as needed.      . metFORMIN (GLUCOPHAGE) 500 MG tablet Take 1 tablet (500 mg total) by mouth 2 (two) times daily with a meal. Will give a 90 day supply at visit  60 tablet  0  . ONE TOUCH LANCETS MISC Check bid and as prn  200 each  12   No current facility-administered  medications on file prior to visit.    No Known Allergies  Review of Systems  Review of Systems  Constitutional: Positive for malaise/fatigue. Negative for fever.  HENT: Negative for congestion.   Eyes: Negative for discharge.  Respiratory: Negative for shortness of breath.   Cardiovascular: Negative for chest pain, palpitations and leg swelling.  Gastrointestinal: Negative for nausea, abdominal pain and diarrhea.  Genitourinary: Negative for dysuria.  Musculoskeletal: Negative for falls.  Skin: Negative for rash.  Neurological: Negative for loss of consciousness and headaches.  Endo/Heme/Allergies: Negative for polydipsia.  Psychiatric/Behavioral: Positive for depression. Negative for suicidal ideas. The patient is nervous/anxious. The patient does not have insomnia.     Objective  BP 162/94  Pulse 96  Temp(Src) 98 F (36.7 C) (Oral)  Ht 5' 7.5" (1.715 m)  Wt 276 lb 0.5 oz  (125.206 kg)  BMI 42.57 kg/m2  SpO2 96%  LMP 08/18/2013  Physical Exam  Physical Exam  Constitutional: She is oriented to person, place, and time and well-developed, well-nourished, and in no distress. No distress.  HENT:  Head: Normocephalic and atraumatic.  Eyes: Conjunctivae are normal.  Neck: Neck supple. No thyromegaly present.  Cardiovascular: Normal rate, regular rhythm and normal heart sounds.   No murmur heard. Pulmonary/Chest: Effort normal and breath sounds normal. She has no wheezes.  Abdominal: She exhibits no distension and no mass.  Musculoskeletal: She exhibits no edema.  Lymphadenopathy:    She has no cervical adenopathy.  Neurological: She is alert and oriented to person, place, and time.  Skin: Skin is warm and dry. No rash noted. She is not diaphoretic.  Psychiatric: Memory, affect and judgment normal.    Lab Results  Component Value Date   TSH 0.758 08/25/2013   Lab Results  Component Value Date   WBC 8.8 08/25/2013   HGB 14.7 08/25/2013   HCT 43.1 08/25/2013   MCV 87.4 08/25/2013   PLT 333 08/25/2013   Lab Results  Component Value Date   CREATININE 0.56 08/25/2013   BUN 11 08/25/2013   NA 134* 08/25/2013   K 4.2 08/25/2013   CL 99 08/25/2013   CO2 28 08/25/2013   Lab Results  Component Value Date   ALT 15 08/25/2013   AST 16 08/25/2013   ALKPHOS 74 08/25/2013   BILITOT 0.7 08/25/2013   Lab Results  Component Value Date   CHOL 172 08/25/2013   Lab Results  Component Value Date   HDL 31* 08/25/2013   Lab Results  Component Value Date   LDLCALC 96 08/25/2013   Lab Results  Component Value Date   TRIG 227* 08/25/2013   Lab Results  Component Value Date   CHOLHDL 5.5 08/25/2013     Assessment & Plan  HTN (hypertension) Patient not in for over a year without bp meds. Will have her restart and return for recheck, encouraged DASH diet  Diabetes mellitus hgba1c greatly improved from lastt year and not taking Metformin. Will restart Metformin  and avoid simple carbs.  Other and unspecified hyperlipidemia Avoid trans fats, start Atorvastatin, start Krill oil caps.  Obesity Encouraged DASH diet, increase exercise.

## 2013-09-06 ENCOUNTER — Other Ambulatory Visit: Payer: Self-pay | Admitting: Family Medicine

## 2013-11-03 ENCOUNTER — Other Ambulatory Visit: Payer: Self-pay

## 2013-11-10 LAB — CBC
MCH: 29.6 pg (ref 26.0–34.0)
MCHC: 34 g/dL (ref 30.0–36.0)
MCV: 87 fL (ref 78.0–100.0)
Platelets: 326 10*3/uL (ref 150–400)
RBC: 4.83 MIL/uL (ref 3.87–5.11)

## 2013-11-10 LAB — LIPID PANEL
Cholesterol: 136 mg/dL (ref 0–200)
HDL: 26 mg/dL — ABNORMAL LOW (ref >39)
Triglycerides: 172 mg/dL — ABNORMAL HIGH (ref <150)
VLDL: 34 mg/dL (ref 0–40)

## 2013-11-10 LAB — RENAL FUNCTION PANEL
BUN: 15 mg/dL (ref 6–23)
CO2: 29 mEq/L (ref 19–32)
Chloride: 99 mEq/L (ref 96–112)
Creat: 0.58 mg/dL (ref 0.50–1.10)
Phosphorus: 3 mg/dL (ref 2.3–4.6)

## 2013-11-10 LAB — TSH: TSH: 0.901 u[IU]/mL (ref 0.350–4.500)

## 2013-11-10 LAB — HEPATIC FUNCTION PANEL
ALT: 12 U/L (ref 0–35)
Albumin: 4.1 g/dL (ref 3.5–5.2)
Indirect Bilirubin: 0.6 mg/dL (ref 0.0–0.9)
Total Protein: 7.3 g/dL (ref 6.0–8.3)

## 2013-11-10 LAB — HEMOGLOBIN A1C: Mean Plasma Glucose: 180 mg/dL — ABNORMAL HIGH (ref <117)

## 2013-11-14 ENCOUNTER — Other Ambulatory Visit: Payer: Self-pay | Admitting: Family Medicine

## 2013-11-14 ENCOUNTER — Other Ambulatory Visit (HOSPITAL_COMMUNITY)
Admission: RE | Admit: 2013-11-14 | Discharge: 2013-11-14 | Disposition: A | Discharge status: Home / Self Care | Source: Ambulatory Visit | Attending: Family Medicine | Admitting: Family Medicine

## 2013-11-14 ENCOUNTER — Ambulatory Visit (INDEPENDENT_AMBULATORY_CARE_PROVIDER_SITE_OTHER): Admitting: Family Medicine

## 2013-11-14 ENCOUNTER — Encounter: Payer: Self-pay | Admitting: Family Medicine

## 2013-11-14 VITALS — BP 152/96 | HR 85 | Temp 98.9°F | Ht 67.5 in | Wt 278.0 lb

## 2013-11-14 DIAGNOSIS — F329 Major depressive disorder, single episode, unspecified: Secondary | ICD-10-CM

## 2013-11-14 DIAGNOSIS — T7840XD Allergy, unspecified, subsequent encounter: Secondary | ICD-10-CM

## 2013-11-14 DIAGNOSIS — E119 Type 2 diabetes mellitus without complications: Secondary | ICD-10-CM

## 2013-11-14 DIAGNOSIS — I10 Essential (primary) hypertension: Secondary | ICD-10-CM

## 2013-11-14 DIAGNOSIS — Z5189 Encounter for other specified aftercare: Secondary | ICD-10-CM

## 2013-11-14 DIAGNOSIS — Z124 Encounter for screening for malignant neoplasm of cervix: Secondary | ICD-10-CM

## 2013-11-14 DIAGNOSIS — F419 Anxiety disorder, unspecified: Secondary | ICD-10-CM

## 2013-11-14 DIAGNOSIS — Z01419 Encounter for gynecological examination (general) (routine) without abnormal findings: Secondary | ICD-10-CM | POA: Insufficient documentation

## 2013-11-14 DIAGNOSIS — Z0000 Encounter for general adult medical examination without abnormal findings: Secondary | ICD-10-CM

## 2013-11-14 DIAGNOSIS — F341 Dysthymic disorder: Secondary | ICD-10-CM

## 2013-11-14 DIAGNOSIS — J329 Chronic sinusitis, unspecified: Secondary | ICD-10-CM

## 2013-11-14 HISTORY — DX: Chronic sinusitis, unspecified: J32.9

## 2013-11-14 MED ORDER — LORAZEPAM 0.5 MG PO TABS: 0.5000 mg | ORAL_TABLET | Freq: Two times a day (BID) | ORAL | Status: DC | PRN

## 2013-11-14 MED ORDER — LISINOPRIL 20 MG PO TABS: 20.0000 mg | ORAL_TABLET | Freq: Every day | ORAL | Status: DC

## 2013-11-14 MED ORDER — METFORMIN HCL 500 MG PO TABS: 500.0000 mg | ORAL_TABLET | Freq: Three times a day (TID) | ORAL | Status: DC

## 2013-11-14 MED ORDER — CEFDINIR 300 MG PO CAPS: 300.0000 mg | ORAL_CAPSULE | Freq: Two times a day (BID) | ORAL | Status: AC

## 2013-11-14 MED ORDER — FLUCONAZOLE 150 MG PO TABS: 150.0000 mg | ORAL_TABLET | ORAL | Status: DC

## 2013-11-14 MED ORDER — LISINOPRIL 20 MG PO TABS: 20.0000 mg | ORAL_TABLET | Freq: Two times a day (BID) | ORAL | Status: DC

## 2013-11-14 NOTE — Assessment & Plan Note (Signed)
hgba1c up to 7.9 encouraged DASH diet, avoid simple carbs, increase Metformin to 500 mg po tid and increase exercise

## 2013-11-14 NOTE — Assessment & Plan Note (Addendum)
Menarche at 78, always irregular Last pap at 20, normal Sexually active.  G0P0 No h/o MGM Pap today

## 2013-11-14 NOTE — Assessment & Plan Note (Signed)
Encouraged Zyrtec, Mucinex, probiotics, increased rest and hydration if no improvement given an rx for cefdinir, call if no improvement

## 2013-11-14 NOTE — Progress Notes (Signed)
Pre visit review using our clinic review tool, if applicable. No additional management support is needed unless otherwise documented below in the visit note. 

## 2013-11-14 NOTE — Progress Notes (Signed)
Patient ID: Karalyne Nusser, female   DOB: 1971-10-23, 42 y.o.   MRN: [id number] Rya Rausch [id number] [birth year] 11/14/2013      Progress Note-Follow Up  Subjective  Chief Complaint  Chief Complaint  Patient presents with  . Annual Exam    physical  . Gynecologic Exam    pap    HPI  Patient is a 42 year old Caucasian the with in today for annual exam. Unfortunately she's not been feeling well this week. She has significant congestion and left ear pain. She's had green rhinorrhea as well some nosebleeds. She's been taking Mucinex allergy with minimal improvement. No obvious fevers chills. No chest pain, palpitations or shortness of breath. No GI or GU complaints at this time. Taking medications as prescribed.  Past Medical History  Diagnosis Date  . Diabetes mellitus 08-12-12    type 2- dr Ashley Royalty diagnosed  . Chicken pox 42 yrs old  . Diabetic retinopathy   . Obesity   . HTN (hypertension) 08/20/2012  . Allergy   . Preventative health care 08/20/2012  . Dysmenorrhea 08/20/2012  . Other and unspecified hyperlipidemia 08/28/2013  . Obesity, unspecified 08/28/2013    Past Surgical History  Procedure Laterality Date  . Refractive surgery  08-12-12    right, left on 09/16/12    Family History  Problem Relation Age of Onset  . Cancer Mother 75    breast  . Diabetes Mother     type 2  . Hypertension Mother   . Hypertension Father   . Hyperlipidemia Father   . COPD Father   . Hypertension Maternal Grandmother   . Hyperlipidemia Maternal Grandmother   . COPD Maternal Grandmother   . Asthma Maternal Grandmother   . Hypertension Maternal Grandfather   . Heart attack Maternal Grandfather   . Gout Brother   . Cancer Brother     lung  . Heart disease Paternal Grandfather     History   Social History  . Marital Status: Married    Spouse Name: N/A    Number of Children: N/A  . Years of Education: N/A   Occupational History  . Not on file.   Social History Main Topics   . Smoking status: Never Smoker   . Smokeless tobacco: Never Used  . Alcohol Use: Yes     Comment: socially  . Drug Use: No  . Sexual Activity: Yes    Partners: Male   Other Topics Concern  . Not on file   Social History Narrative  . No narrative on file    Current Outpatient Prescriptions on File Prior to Visit  Medication Sig Dispense Refill  . atorvastatin (LIPITOR) 10 MG tablet Take 1 tablet (10 mg total) by mouth daily.  30 tablet  3  . Blood Glucose Monitoring Suppl (ONE TOUCH BASIC SYSTEM) W/DEVICE KIT Test bid prn  1 each  0  . glucose blood (ONE TOUCH TEST STRIPS) test strip 1 each by Other route 2 (two) times daily as needed for other.  100 each  12  . ibuprofen (ADVIL,MOTRIN) 200 MG tablet Take 400 mg by mouth every 8 (eight) hours as needed.      . ONE TOUCH LANCETS MISC Check bid and as prn  200 each  12   No current facility-administered medications on file prior to visit.    No Known Allergies  Review of Systems  Review of Systems  Constitutional: Negative for fever and malaise/fatigue.  HENT: Positive for congestion and ear pain.  Eyes: Negative for discharge.  Respiratory: Positive for sputum production. Negative for shortness of breath.   Cardiovascular: Negative for chest pain, palpitations and leg swelling.  Gastrointestinal: Negative for nausea, abdominal pain and diarrhea.  Genitourinary: Negative for dysuria.  Musculoskeletal: Negative for falls.  Skin: Negative for rash.  Neurological: Positive for headaches. Negative for loss of consciousness.  Endo/Heme/Allergies: Negative for polydipsia.  Psychiatric/Behavioral: Negative for depression and suicidal ideas. The patient is not nervous/anxious and does not have insomnia.     Objective  BP 174/100  Pulse 85  Temp(Src) 98.9 F (37.2 C) (Oral)  Ht 5' 7.5" (1.715 m)  Wt 278 lb (126.1 kg)  BMI 42.87 kg/m2  SpO2 98%  Physical Exam  Physical Exam  Constitutional: She is oriented to person,  place, and time and well-developed, well-nourished, and in no distress. No distress.  HENT:  Head: Normocephalic and atraumatic.  Right Ear: External ear normal.  Left Ear: External ear normal.  Nose: Nose normal.  Mouth/Throat: Oropharynx is clear and moist. No oropharyngeal exudate.  Eyes: Conjunctivae are normal. Pupils are equal, round, and reactive to light. Right eye exhibits no discharge. Left eye exhibits no discharge. No scleral icterus.  Neck: Normal range of motion. Neck supple. No thyromegaly present.  Cardiovascular: Normal rate, regular rhythm, normal heart sounds and intact distal pulses.   No murmur heard. Pulmonary/Chest: Effort normal and breath sounds normal. No respiratory distress. She has no wheezes. She has no rales.  Abdominal: Soft. Bowel sounds are normal. She exhibits no distension and no mass. There is no tenderness.  Genitourinary: Vagina normal, uterus normal, cervix normal, right adnexa normal and left adnexa normal. No vaginal discharge found.  Breast exam unremarkable. No lesions, skin changes  Musculoskeletal: Normal range of motion. She exhibits no edema and no tenderness.  Lymphadenopathy:    She has no cervical adenopathy.  Neurological: She is alert and oriented to person, place, and time. She has normal reflexes. No cranial nerve deficit. Coordination normal.  Skin: Skin is warm and dry. No rash noted. She is not diaphoretic.  Psychiatric: Mood, memory and affect normal.    Lab Results  Component Value Date   TSH 0.901 11/10/2013   Lab Results  Component Value Date   WBC 7.4 11/10/2013   HGB 14.3 11/10/2013   HCT 42.0 11/10/2013   MCV 87.0 11/10/2013   PLT 326 11/10/2013   Lab Results  Component Value Date   CREATININE 0.58 11/10/2013   BUN 15 11/10/2013   NA 136 11/10/2013   K 4.4 11/10/2013   CL 99 11/10/2013   CO2 29 11/10/2013   Lab Results  Component Value Date   ALT 12 11/10/2013   AST 14 11/10/2013   ALKPHOS 85 11/10/2013    BILITOT 0.8 11/10/2013   Lab Results  Component Value Date   CHOL 136 11/10/2013   Lab Results  Component Value Date   HDL 26* 11/10/2013   Lab Results  Component Value Date   LDLCALC 76 11/10/2013   Lab Results  Component Value Date   TRIG 172* 11/10/2013   Lab Results  Component Value Date   CHOLHDL 5.2 11/10/2013     Assessment & Plan  Cervical cancer screening Menarche at 15, always irregular Last pap at 20, normal Sexually active.  G0P0 No h/o MGM Pap today  Diabetes mellitus hgba1c up to 7.9 encouraged DASH diet, avoid simple carbs, increase Metformin to 500 mg po tid and increase exercise  Allergic state Add zyrtec  daily  Sinusitis Encouraged Zyrtec, Mucinex, probiotics, increased rest and hydration if no improvement given an rx for cefdinir, call if no improvement  HTN (hypertension) Patient acutely ill but will increase Lisinopril to bid, encouraged DASH diet. Check renal and bp in 1 months  Preventative health care Encouraged DASH diet and regular exercise, 7-8 hours of sleep. Avoid products with Sudafed and Dextromethorphan. Declines flu shot today

## 2013-11-14 NOTE — Patient Instructions (Addendum)
Push clear fluids and increase rest Add Mucinex 600 mg po twice daily x 10 days Add Zyrtec/Cetirizine 10 mg daily Add a probiotic such as Digestive Advantage, Align or a generic daily   Sinusitis Sinusitis is redness, soreness, and swelling (inflammation) of the paranasal sinuses. Paranasal sinuses are air pockets within the bones of your face (beneath the eyes, the middle of the forehead, or above the eyes). In healthy paranasal sinuses, mucus is able to drain out, and air is able to circulate through them by way of your nose. However, when your paranasal sinuses are inflamed, mucus and air can become trapped. This can allow bacteria and other germs to grow and cause infection. Sinusitis can develop quickly and last only a short time (acute) or continue over a long period (chronic). Sinusitis that lasts for more than 12 weeks is considered chronic.  CAUSES  Causes of sinusitis include:  Allergies.  Structural abnormalities, such as displacement of the cartilage that separates your nostrils (deviated septum), which can decrease the air flow through your nose and sinuses and affect sinus drainage.  Functional abnormalities, such as when the small hairs (cilia) that line your sinuses and help remove mucus do not work properly or are not present. SYMPTOMS  Symptoms of acute and chronic sinusitis are the same. The primary symptoms are pain and pressure around the affected sinuses. Other symptoms include:  Upper toothache.  Earache.  Headache.  Bad breath.  Decreased sense of smell and taste.  A cough, which worsens when you are lying flat.  Fatigue.  Fever.  Thick drainage from your nose, which often is green and may contain pus (purulent).  Swelling and warmth over the affected sinuses. DIAGNOSIS  Your caregiver will perform a physical exam. During the exam, your caregiver may:  Look in your nose for signs of abnormal growths in your nostrils (nasal polyps).  Tap over the  affected sinus to check for signs of infection.  View the inside of your sinuses (endoscopy) with a special imaging device with a light attached (endoscope), which is inserted into your sinuses. If your caregiver suspects that you have chronic sinusitis, one or more of the following tests may be recommended:  Allergy tests.  Nasal culture A sample of mucus is taken from your nose and sent to a lab and screened for bacteria.  Nasal cytology A sample of mucus is taken from your nose and examined by your caregiver to determine if your sinusitis is related to an allergy. TREATMENT  Most cases of acute sinusitis are related to a viral infection and will resolve on their own within 10 days. Sometimes medicines are prescribed to help relieve symptoms (pain medicine, decongestants, nasal steroid sprays, or saline sprays).  However, for sinusitis related to a bacterial infection, your caregiver will prescribe antibiotic medicines. These are medicines that will help kill the bacteria causing the infection.  Rarely, sinusitis is caused by a fungal infection. In theses cases, your caregiver will prescribe antifungal medicine. For some cases of chronic sinusitis, surgery is needed. Generally, these are cases in which sinusitis recurs more than 3 times per year, despite other treatments. HOME CARE INSTRUCTIONS   Drink plenty of water. Water helps thin the mucus so your sinuses can drain more easily.  Use a humidifier.  Inhale steam 3 to 4 times a day (for example, sit in the bathroom with the shower running).  Apply a warm, moist washcloth to your face 3 to 4 times a day, or as directed  by your caregiver.  Use saline nasal sprays to help moisten and clean your sinuses.  Take over-the-counter or prescription medicines for pain, discomfort, or fever only as directed by your caregiver. SEEK IMMEDIATE MEDICAL CARE IF:  You have increasing pain or severe headaches.  You have nausea, vomiting, or  drowsiness.  You have swelling around your face.  You have vision problems.  You have a stiff neck.  You have difficulty breathing. MAKE SURE YOU:   Understand these instructions.  Will watch your condition.  Will get help right away if you are not doing well or get worse. Document Released: 12/15/2005 Document Revised: 03/08/2012 Document Reviewed: 12/30/2011 [facility] Patient Information 2014 [place], [place].

## 2013-11-14 NOTE — Assessment & Plan Note (Signed)
Add zyrtec daily .   

## 2013-11-17 ENCOUNTER — Encounter: Payer: Self-pay | Admitting: Family Medicine

## 2013-11-18 ENCOUNTER — Other Ambulatory Visit: Payer: Self-pay | Admitting: *Deleted

## 2013-11-18 DIAGNOSIS — I10 Essential (primary) hypertension: Secondary | ICD-10-CM

## 2013-11-18 MED ORDER — LISINOPRIL 20 MG PO TABS: 20.0000 mg | ORAL_TABLET | Freq: Two times a day (BID) | ORAL | Status: DC

## 2013-11-18 NOTE — Progress Notes (Signed)
Pharmacy reports receiving conflicting escribes on Lisinopril with last Rx showing Sig: take [1] tab once daily #90x3 sent on 11.17.14; informed correct directions according to EMR is: take [1] tab BID with a dispense #60x5 px on 11.17.14 by provider; resent Rx at pharmacy request/SLS

## 2013-11-19 NOTE — Assessment & Plan Note (Signed)
Patient acutely ill but will increase Lisinopril to bid, encouraged DASH diet. Check renal and bp in 1 months

## 2013-11-19 NOTE — Assessment & Plan Note (Signed)
Encouraged DASH diet and regular exercise, 7-8 hours of sleep. Avoid products with Sudafed and Dextromethorphan. Declines flu shot today

## 2013-12-27 ENCOUNTER — Other Ambulatory Visit: Payer: Self-pay | Admitting: Family Medicine

## 2014-01-17 ENCOUNTER — Telehealth: Payer: Self-pay | Admitting: *Deleted

## 2014-01-17 DIAGNOSIS — I10 Essential (primary) hypertension: Secondary | ICD-10-CM

## 2014-01-17 NOTE — Telephone Encounter (Signed)
Pt presented to the lab.  Order entered per 11/14/13 office note as below:   HTN (hypertension) - Bradd CanaryStacey A Blyth, MD at 11/19/2013 1:40 PM     Status: Written Related Problem: HTN (hypertension)    Patient acutely ill but will increase Lisinopril to bid, encouraged DASH diet. Check renal and bp in 1 months

## 2014-01-18 LAB — RENAL FUNCTION PANEL
Albumin: 3.7 g/dL (ref 3.5–5.2)
BUN: 9 mg/dL (ref 6–23)
CHLORIDE: 101 meq/L (ref 96–112)
CO2: 29 mEq/L (ref 19–32)
Calcium: 9 mg/dL (ref 8.4–10.5)
Creat: 0.54 mg/dL (ref 0.50–1.10)
Glucose, Bld: 180 mg/dL — ABNORMAL HIGH (ref 70–99)
POTASSIUM: 4.6 meq/L (ref 3.5–5.3)
Phosphorus: 3.3 mg/dL (ref 2.3–4.6)
SODIUM: 139 meq/L (ref 135–145)

## 2014-01-19 ENCOUNTER — Encounter: Payer: Self-pay | Admitting: Family Medicine

## 2014-01-19 ENCOUNTER — Ambulatory Visit (INDEPENDENT_AMBULATORY_CARE_PROVIDER_SITE_OTHER): Admitting: Family Medicine

## 2014-01-19 ENCOUNTER — Telehealth: Payer: Self-pay | Admitting: Family Medicine

## 2014-01-19 VITALS — BP 158/92 | HR 85 | Temp 98.8°F | Ht 67.5 in | Wt 283.1 lb

## 2014-01-19 DIAGNOSIS — E785 Hyperlipidemia, unspecified: Secondary | ICD-10-CM

## 2014-01-19 DIAGNOSIS — E119 Type 2 diabetes mellitus without complications: Secondary | ICD-10-CM

## 2014-01-19 DIAGNOSIS — Z0000 Encounter for general adult medical examination without abnormal findings: Secondary | ICD-10-CM

## 2014-01-19 DIAGNOSIS — I10 Essential (primary) hypertension: Secondary | ICD-10-CM

## 2014-01-19 MED ORDER — HYDROCHLOROTHIAZIDE 25 MG PO TABS: 25.0000 mg | ORAL_TABLET | Freq: Every day | ORAL | Status: DC

## 2014-01-19 MED ORDER — METFORMIN HCL 500 MG PO TABS: 1000.0000 mg | ORAL_TABLET | Freq: Two times a day (BID) | ORAL | Status: DC

## 2014-01-19 NOTE — Patient Instructions (Signed)
DASH Diet  The DASH diet stands for "Dietary Approaches to Stop Hypertension." It is a healthy eating plan that has been shown to reduce high blood pressure (hypertension) in as little as 14 days, while also possibly providing other significant health benefits. These other health benefits include reducing the risk of breast cancer after menopause and reducing the risk of type 2 diabetes, heart disease, colon cancer, and stroke. Health benefits also include weight loss and slowing kidney failure in patients with chronic kidney disease.   DIET GUIDELINES  · Limit salt (sodium). Your diet should contain less than 1500 mg of sodium daily.  · Limit refined or processed carbohydrates. Your diet should include mostly whole grains. Desserts and added sugars should be used sparingly.  · Include small amounts of heart-healthy fats. These types of fats include nuts, oils, and tub margarine. Limit saturated and trans fats. These fats have been shown to be harmful in the body.  CHOOSING FOODS   The following food groups are based on a 2000 calorie diet. See your Registered Dietitian for individual calorie needs.  Grains and Grain Products (6 to 8 servings daily)  · Eat More Often: Whole-wheat bread, brown rice, whole-grain or wheat pasta, quinoa, popcorn without added fat or salt (air popped).  · Eat Less Often: White bread, white pasta, white rice, cornbread.  Vegetables (4 to 5 servings daily)  · Eat More Often: Fresh, frozen, and canned vegetables. Vegetables may be raw, steamed, roasted, or grilled with a minimal amount of fat.  · Eat Less Often/Avoid: Creamed or fried vegetables. Vegetables in a cheese sauce.  Fruit (4 to 5 servings daily)  · Eat More Often: All fresh, canned (in natural juice), or frozen fruits. Dried fruits without added sugar. One hundred percent fruit juice (½ cup [237 mL] daily).  · Eat Less Often: Dried fruits with added sugar. Canned fruit in light or heavy syrup.  Lean Meats, Fish, and Poultry (2  servings or less daily. One serving is 3 to 4 oz [85-114 g]).  · Eat More Often: Ninety percent or leaner ground beef, tenderloin, sirloin. Round cuts of beef, chicken breast, turkey breast. All fish. Grill, bake, or broil your meat. Nothing should be fried.  · Eat Less Often/Avoid: Fatty cuts of meat, turkey, or chicken leg, thigh, or wing. Fried cuts of meat or fish.  Dairy (2 to 3 servings)  · Eat More Often: Low-fat or fat-free milk, low-fat plain or light yogurt, reduced-fat or part-skim cheese.  · Eat Less Often/Avoid: Milk (whole, 2%). Whole milk yogurt. Full-fat cheeses.  Nuts, Seeds, and Legumes (4 to 5 servings per week)  · Eat More Often: All without added salt.  · Eat Less Often/Avoid: Salted nuts and seeds, canned beans with added salt.  Fats and Sweets (limited)  · Eat More Often: Vegetable oils, tub margarines without trans fats, sugar-free gelatin. Mayonnaise and salad dressings.  · Eat Less Often/Avoid: Coconut oils, palm oils, butter, stick margarine, cream, half and half, cookies, candy, pie.  FOR MORE INFORMATION  The Dash Diet Eating Plan: www.dashdiet.org  Document Released: 12/04/2011 Document Revised: 03/08/2012 Document Reviewed: 12/04/2011  [employer]® Patient Information ©2014 [employer].

## 2014-01-19 NOTE — Progress Notes (Signed)
Pre visit review using our clinic review tool, if applicable. No additional management support is needed unless otherwise documented below in the visit note. 

## 2014-01-19 NOTE — Telephone Encounter (Signed)
Lab order placed.

## 2014-01-19 NOTE — Telephone Encounter (Signed)
Labs prior lipid, renal, cbc, tsh, hepatic, hgba1c   [facility]

## 2014-01-20 ENCOUNTER — Telehealth: Payer: Self-pay | Admitting: Family Medicine

## 2014-01-20 ENCOUNTER — Telehealth: Payer: Self-pay

## 2014-01-20 NOTE — Telephone Encounter (Signed)
Relevant patient education assigned to patient using Emmi. ° °

## 2014-01-23 NOTE — Assessment & Plan Note (Signed)
Tolerating Atorvastatin 

## 2014-01-23 NOTE — Progress Notes (Signed)
Patient ID: April Warren, female   DOB: 03/01/1971, 43 y.o.   MRN: [id number] Dorrie Cocuzza [id number] [birth year] 01/23/2014      Progress Note-Follow Up   Chief Complaint  Chief Complaint  Patient presents with  . Follow-up    2 month    HPI  Patient is a 43 yo female in today for followup. Generally doing well. Does not blood sugars have been in roughly the 150 range recently. Denies polyuria or polydipsia. Has had occasional headaches but no photophobia or phonophobia. Reports blood pressures have been 140s to 150s when she checks elsewhere. Diastolics generally in 24M. Denies chest pain or palpitation, shortness of breath, GI or GU complaints   Past Medical History  Diagnosis Date  . Diabetes mellitus 08-12-12    type 2- dr Zigmund Daniel diagnosed  . Chicken pox 43 yrs old  . Diabetic retinopathy   . Obesity   . HTN (hypertension) 08/20/2012  . Allergy   . Preventative health care 08/20/2012  . Dysmenorrhea 08/20/2012  . Other and unspecified hyperlipidemia 08/28/2013  . Obesity, unspecified 08/28/2013  . Sinusitis 11/14/2013    Past Surgical History  Procedure Laterality Date  . Refractive surgery  08-12-12    right, left on 09/16/12    Family History  Problem Relation Age of Onset  . Cancer Mother 66    breast  . Diabetes Mother     type 2  . Hypertension Mother   . Hypertension Father   . Hyperlipidemia Father   . COPD Father   . Hypertension Maternal Grandmother   . Hyperlipidemia Maternal Grandmother   . COPD Maternal Grandmother   . Asthma Maternal Grandmother   . Hypertension Maternal Grandfather   . Heart attack Maternal Grandfather   . Gout Brother   . Cancer Brother     lung  . Heart disease Paternal Grandfather     History   Social History  . Marital Status: Married    Spouse Name: N/A    Number of Children: N/A  . Years of Education: N/A   Occupational History  . Not on file.   Social History Main Topics  . Smoking status: Never Smoker   .  Smokeless tobacco: Never Used  . Alcohol Use: Yes     Comment: socially  . Drug Use: No  . Sexual Activity: Yes    Partners: Male   Other Topics Concern  . Not on file   Social History Narrative  . No narrative on file    Current Outpatient Prescriptions on File Prior to Visit  Medication Sig Dispense Refill  . atorvastatin (LIPITOR) 10 MG tablet TAKE 1 TABLET (10 MG TOTAL) BY MOUTH DAILY.  30 tablet  3  . Blood Glucose Monitoring Suppl (ONE TOUCH BASIC SYSTEM) W/DEVICE KIT Test bid prn  1 each  0  . glucose blood (ONE TOUCH TEST STRIPS) test strip 1 each by Other route 2 (two) times daily as needed for other.  100 each  12  . ibuprofen (ADVIL,MOTRIN) 200 MG tablet Take 400 mg by mouth every 8 (eight) hours as needed.      Marland Kitchen lisinopril (PRINIVIL,ZESTRIL) 20 MG tablet Take 1 tablet (20 mg total) by mouth 2 (two) times daily.  60 tablet  5  . LORazepam (ATIVAN) 0.5 MG tablet Take 1 tablet (0.5 mg total) by mouth 2 (two) times daily as needed for anxiety or sleep.  40 tablet  2  . ONE TOUCH LANCETS MISC Check bid and as  prn  200 each  12   No current facility-administered medications on file prior to visit.    No Known Allergies  Review of Systems  Review of Systems  Constitutional: Negative for fever and malaise/fatigue.  HENT: Negative for congestion.   Eyes: Negative for pain and discharge.  Respiratory: Negative for shortness of breath.   Cardiovascular: Negative for chest pain, palpitations and leg swelling.  Gastrointestinal: Negative for nausea, abdominal pain and diarrhea.  Genitourinary: Negative for dysuria.  Musculoskeletal: Negative for falls.  Skin: Negative for rash.  Neurological: Positive for headaches. Negative for loss of consciousness.  Endo/Heme/Allergies: Negative for polydipsia.  Psychiatric/Behavioral: Negative for depression and suicidal ideas. The patient is not nervous/anxious and does not have insomnia.     Objective  BP 158/92  Pulse 85   Temp(Src) 98.8 F (37.1 C) (Oral)  Ht 5' 7.5" (1.715 m)  Wt 283 lb 1.9 oz (128.422 kg)  BMI 43.66 kg/m2  SpO2 97%  LMP 01/02/2014  Physical Exam  Physical Exam  Constitutional: She is oriented to person, place, and time and well-developed, well-nourished, and in no distress. No distress.  HENT:  Head: Normocephalic and atraumatic.  Eyes: Conjunctivae are normal.  Neck: Neck supple. No thyromegaly present.  Cardiovascular: Normal rate, regular rhythm and normal heart sounds.   No murmur heard. Pulmonary/Chest: Effort normal and breath sounds normal. She has no wheezes.  Abdominal: She exhibits no distension and no mass.  Musculoskeletal: She exhibits no edema.  Lymphadenopathy:    She has no cervical adenopathy.  Neurological: She is alert and oriented to person, place, and time.  Skin: Skin is warm and dry. No rash noted. She is not diaphoretic.  Psychiatric: Memory, affect and judgment normal.    Lab Results  Component Value Date   TSH 0.901 11/10/2013   Lab Results  Component Value Date   WBC 7.4 11/10/2013   HGB 14.3 11/10/2013   HCT 42.0 11/10/2013   MCV 87.0 11/10/2013   PLT 326 11/10/2013   Lab Results  Component Value Date   CREATININE 0.54 01/17/2014   BUN 9 01/17/2014   NA 139 01/17/2014   K 4.6 01/17/2014   CL 101 01/17/2014   CO2 29 01/17/2014   Lab Results  Component Value Date   ALT 12 11/10/2013   AST 14 11/10/2013   ALKPHOS 85 11/10/2013   BILITOT 0.8 11/10/2013   Lab Results  Component Value Date   CHOL 136 11/10/2013   Lab Results  Component Value Date   HDL 26* 11/10/2013   Lab Results  Component Value Date   LDLCALC 76 11/10/2013   Lab Results  Component Value Date   TRIG 172* 11/10/2013   Lab Results  Component Value Date   CHOLHDL 5.2 11/10/2013     Assessment & Plan  HTN (hypertension) Add HCTZ 25 mg daily, minimize sodium and caffeine  Diabetes mellitus Declines flu shot. Minimize simple carbs continue current meds,  recheck hgba1c next month  Other and unspecified hyperlipidemia Tolerating Atorvastatin

## 2014-01-23 NOTE — Assessment & Plan Note (Signed)
Add HCTZ 25 mg daily, minimize sodium and caffeine

## 2014-01-23 NOTE — Assessment & Plan Note (Addendum)
Declines flu shot. Minimize simple carbs continue current meds, recheck hgba1c next month

## 2014-02-08 ENCOUNTER — Ambulatory Visit (INDEPENDENT_AMBULATORY_CARE_PROVIDER_SITE_OTHER): Payer: TRICARE For Life (TFL) | Admitting: Ophthalmology

## 2014-02-08 DIAGNOSIS — I10 Essential (primary) hypertension: Secondary | ICD-10-CM

## 2014-02-08 DIAGNOSIS — E11319 Type 2 diabetes mellitus with unspecified diabetic retinopathy without macular edema: Secondary | ICD-10-CM

## 2014-02-08 DIAGNOSIS — H2510 Age-related nuclear cataract, unspecified eye: Secondary | ICD-10-CM

## 2014-02-08 DIAGNOSIS — H43819 Vitreous degeneration, unspecified eye: Secondary | ICD-10-CM

## 2014-02-08 DIAGNOSIS — E1165 Type 2 diabetes mellitus with hyperglycemia: Secondary | ICD-10-CM

## 2014-02-08 DIAGNOSIS — H35039 Hypertensive retinopathy, unspecified eye: Secondary | ICD-10-CM

## 2014-02-08 DIAGNOSIS — E1139 Type 2 diabetes mellitus with other diabetic ophthalmic complication: Secondary | ICD-10-CM

## 2014-02-17 LAB — RENAL FUNCTION PANEL
ALBUMIN: 3.9 g/dL (ref 3.5–5.2)
BUN: 17 mg/dL (ref 6–23)
CALCIUM: 8.7 mg/dL (ref 8.4–10.5)
CO2: 28 meq/L (ref 19–32)
Chloride: 99 mEq/L (ref 96–112)
Creat: 0.61 mg/dL (ref 0.50–1.10)
GLUCOSE: 185 mg/dL — AB (ref 70–99)
Phosphorus: 3.6 mg/dL (ref 2.3–4.6)
Potassium: 4.7 mEq/L (ref 3.5–5.3)
SODIUM: 136 meq/L (ref 135–145)

## 2014-02-17 LAB — CBC
HCT: 38.7 % (ref 36.0–46.0)
HEMOGLOBIN: 13.3 g/dL (ref 12.0–15.0)
MCH: 29.5 pg (ref 26.0–34.0)
MCHC: 34.4 g/dL (ref 30.0–36.0)
MCV: 85.8 fL (ref 78.0–100.0)
Platelets: 302 10*3/uL (ref 150–400)
RBC: 4.51 MIL/uL (ref 3.87–5.11)
RDW: 14 % (ref 11.5–15.5)
WBC: 8.5 10*3/uL (ref 4.0–10.5)

## 2014-02-17 LAB — HEPATIC FUNCTION PANEL
ALK PHOS: 65 U/L (ref 39–117)
ALT: 15 U/L (ref 0–35)
AST: 13 U/L (ref 0–37)
Albumin: 3.9 g/dL (ref 3.5–5.2)
Bilirubin, Direct: 0.1 mg/dL (ref 0.0–0.3)
Indirect Bilirubin: 0.6 mg/dL (ref 0.2–1.2)
TOTAL PROTEIN: 6.5 g/dL (ref 6.0–8.3)
Total Bilirubin: 0.7 mg/dL (ref 0.2–1.2)

## 2014-02-17 LAB — LIPID PANEL
CHOL/HDL RATIO: 4.7 ratio
CHOLESTEROL: 113 mg/dL (ref 0–200)
HDL: 24 mg/dL — AB (ref >39)
LDL Cholesterol: 29 mg/dL (ref 0–99)
Triglycerides: 300 mg/dL — ABNORMAL HIGH (ref <150)
VLDL: 60 mg/dL — ABNORMAL HIGH (ref 0–40)

## 2014-02-17 LAB — TSH: TSH: 1.264 u[IU]/mL (ref 0.350–4.500)

## 2014-02-17 LAB — HEMOGLOBIN A1C
Hgb A1c MFr Bld: 8.2 % — ABNORMAL HIGH (ref <5.7)
Mean Plasma Glucose: 189 mg/dL — ABNORMAL HIGH (ref <117)

## 2014-02-22 ENCOUNTER — Telehealth: Payer: Self-pay | Admitting: Family Medicine

## 2014-02-22 ENCOUNTER — Ambulatory Visit (INDEPENDENT_AMBULATORY_CARE_PROVIDER_SITE_OTHER): Admitting: Family Medicine

## 2014-02-22 ENCOUNTER — Encounter: Payer: Self-pay | Admitting: Family Medicine

## 2014-02-22 VITALS — BP 130/88 | HR 82 | Temp 98.1°F | Ht 67.5 in | Wt 286.0 lb

## 2014-02-22 DIAGNOSIS — E119 Type 2 diabetes mellitus without complications: Secondary | ICD-10-CM

## 2014-02-22 DIAGNOSIS — I10 Essential (primary) hypertension: Secondary | ICD-10-CM

## 2014-02-22 DIAGNOSIS — E669 Obesity, unspecified: Secondary | ICD-10-CM

## 2014-02-22 DIAGNOSIS — E785 Hyperlipidemia, unspecified: Secondary | ICD-10-CM

## 2014-02-22 DIAGNOSIS — Z0000 Encounter for general adult medical examination without abnormal findings: Secondary | ICD-10-CM

## 2014-02-22 MED ORDER — METFORMIN HCL ER (MOD) 500 MG PO TB24: ORAL_TABLET | ORAL | Status: DC

## 2014-02-22 NOTE — Telephone Encounter (Signed)
Lab order placed.

## 2014-02-22 NOTE — Telephone Encounter (Signed)
Lab order week of 05-18-2014 Lipid renal, cbc, tsh, hepatic and hgba1c

## 2014-02-22 NOTE — Patient Instructions (Signed)

## 2014-02-22 NOTE — Progress Notes (Signed)
Pre visit review using our clinic review tool, if applicable. No additional management support is needed unless otherwise documented below in the visit note. 

## 2014-02-23 ENCOUNTER — Ambulatory Visit: Admitting: Family Medicine

## 2014-02-26 ENCOUNTER — Encounter: Payer: Self-pay | Admitting: Family Medicine

## 2014-02-26 NOTE — Assessment & Plan Note (Signed)
Well controlled no changes 

## 2014-02-26 NOTE — Progress Notes (Signed)
Patient ID: April Warren, female   DOB: [birth year], 43 y.o.   MRN: [id number] April Warren [id number] 1971-03-10 02/26/2014      Progress Note-Follow Up  Subjective  Chief Complaint  Chief Complaint  Patient presents with  . Follow-up    5 week    HPI  Patient is a 43 year old female who is in today for followup. She generally been doing well. She has noted a scratchy throat with some mild ear and eye irritation just over the last few days. She denies fevers or chills. She denies malaise or myalgias. She is taking medications as prescribed. Denies polyuria and polydipsia. No chest pain, palpitations or shortness of breath. No GI or GU concerns  Past Medical History  Diagnosis Date  . Diabetes mellitus 08-12-12    type 2- dr Zigmund Daniel diagnosed  . Chicken pox 43 yrs old  . Diabetic retinopathy   . Obesity   . HTN (hypertension) 08/20/2012  . Allergy   . Preventative health care 08/20/2012  . Dysmenorrhea 08/20/2012  . Other and unspecified hyperlipidemia 08/28/2013  . Obesity, unspecified 08/28/2013  . Sinusitis 11/14/2013    Past Surgical History  Procedure Laterality Date  . Refractive surgery  08-12-12    right, left on 09/16/12    Family History  Problem Relation Age of Onset  . Cancer Mother 56    breast  . Diabetes Mother     type 2  . Hypertension Mother   . Hypertension Father   . Hyperlipidemia Father   . COPD Father   . Hypertension Maternal Grandmother   . Hyperlipidemia Maternal Grandmother   . COPD Maternal Grandmother   . Asthma Maternal Grandmother   . Hypertension Maternal Grandfather   . Heart attack Maternal Grandfather   . Gout Brother   . Cancer Brother     lung  . Heart disease Paternal Grandfather     History   Social History  . Marital Status: Married    Spouse Name: N/A    Number of Children: N/A  . Years of Education: N/A   Occupational History  . Not on file.   Social History Main Topics  . Smoking status: Never Smoker   .  Smokeless tobacco: Never Used  . Alcohol Use: Yes     Comment: socially  . Drug Use: No  . Sexual Activity: Yes    Partners: Male   Other Topics Concern  . Not on file   Social History Narrative  . No narrative on file    Current Outpatient Prescriptions on File Prior to Visit  Medication Sig Dispense Refill  . atorvastatin (LIPITOR) 10 MG tablet TAKE 1 TABLET (10 MG TOTAL) BY MOUTH DAILY.  30 tablet  3  . Blood Glucose Monitoring Suppl (ONE TOUCH BASIC SYSTEM) W/DEVICE KIT Test bid prn  1 each  0  . glucose blood (ONE TOUCH TEST STRIPS) test strip 1 each by Other route 2 (two) times daily as needed for other.  100 each  12  . hydrochlorothiazide (HYDRODIURIL) 25 MG tablet Take 1 tablet (25 mg total) by mouth daily.  30 tablet  1  . ibuprofen (ADVIL,MOTRIN) 200 MG tablet Take 400 mg by mouth every 8 (eight) hours as needed.      Marland Kitchen lisinopril (PRINIVIL,ZESTRIL) 20 MG tablet Take 1 tablet (20 mg total) by mouth 2 (two) times daily.  60 tablet  5  . LORazepam (ATIVAN) 0.5 MG tablet Take 1 tablet (0.5 mg total) by mouth 2 (two)  times daily as needed for anxiety or sleep.  40 tablet  2  . ONE TOUCH LANCETS MISC Check bid and as prn  200 each  12   No current facility-administered medications on file prior to visit.    No Known Allergies  Review of Systems  Review of Systems  Constitutional: Negative for fever and malaise/fatigue.  HENT: Positive for congestion.   Eyes: Negative for discharge.  Respiratory: Negative for shortness of breath.   Cardiovascular: Negative for chest pain, palpitations and leg swelling.  Gastrointestinal: Negative for nausea, abdominal pain and diarrhea.  Genitourinary: Negative for dysuria.  Musculoskeletal: Negative for falls.  Skin: Negative for rash.  Neurological: Negative for loss of consciousness and headaches.  Endo/Heme/Allergies: Negative for polydipsia.  Psychiatric/Behavioral: Negative for depression and suicidal ideas. The patient is not  nervous/anxious and does not have insomnia.     Objective  BP 130/88  Pulse 82  Temp(Src) 98.1 F (36.7 C) (Oral)  Ht 5' 7.5" (1.715 m)  Wt 286 lb 0.6 oz (129.747 kg)  BMI 44.11 kg/m2  SpO2 97%  LMP 02/02/2014  Physical Exam  Physical Exam  Constitutional: She is oriented to person, place, and time and well-developed, well-nourished, and in no distress. No distress.  HENT:  Head: Normocephalic and atraumatic.  Eyes: Conjunctivae are normal.  Neck: Neck supple. No thyromegaly present.  Cardiovascular: Normal rate, regular rhythm and normal heart sounds.   No murmur heard. Pulmonary/Chest: Effort normal and breath sounds normal. She has no wheezes.  Abdominal: She exhibits no distension and no mass.  Musculoskeletal: She exhibits no edema.  Lymphadenopathy:    She has no cervical adenopathy.  Neurological: She is alert and oriented to person, place, and time.  Skin: Skin is warm and dry. No rash noted. She is not diaphoretic.  Psychiatric: Memory, affect and judgment normal.    Lab Results  Component Value Date   TSH 1.264 02/17/2014   Lab Results  Component Value Date   WBC 8.5 02/17/2014   HGB 13.3 02/17/2014   HCT 38.7 02/17/2014   MCV 85.8 02/17/2014   PLT 302 02/17/2014   Lab Results  Component Value Date   CREATININE 0.61 02/17/2014   BUN 17 02/17/2014   NA 136 02/17/2014   K 4.7 02/17/2014   CL 99 02/17/2014   CO2 28 02/17/2014   Lab Results  Component Value Date   ALT 15 02/17/2014   AST 13 02/17/2014   ALKPHOS 65 02/17/2014   BILITOT 0.7 02/17/2014   Lab Results  Component Value Date   CHOL 113 02/17/2014   Lab Results  Component Value Date   HDL 24* 02/17/2014   Lab Results  Component Value Date   LDLCALC 29 02/17/2014   Lab Results  Component Value Date   TRIG 300* 02/17/2014   Lab Results  Component Value Date   CHOLHDL 4.7 02/17/2014     Assessment & Plan  HTN (hypertension) Well controlled no changes.   Diabetes mellitus Minimize  simple carbs, continue Metformin, may need to add further meds if hgba1c does not improve.  Obesity Encouraged DASH diet and regular exercise  Other and unspecified hyperlipidemia Tolerating Atorvastain avoid trans fats.

## 2014-02-26 NOTE — Assessment & Plan Note (Signed)
Minimize simple carbs, continue Metformin, may need to add further meds if hgba1c does not improve.

## 2014-02-26 NOTE — Assessment & Plan Note (Signed)
Encouraged DASH diet and regular exercise 

## 2014-02-26 NOTE — Assessment & Plan Note (Signed)
Tolerating Atorvastain avoid trans fats.

## 2014-03-10 ENCOUNTER — Telehealth: Payer: Self-pay | Admitting: Family Medicine

## 2014-03-10 DIAGNOSIS — I10 Essential (primary) hypertension: Secondary | ICD-10-CM

## 2014-03-10 MED ORDER — HYDROCHLOROTHIAZIDE 25 MG PO TABS: 25.0000 mg | ORAL_TABLET | Freq: Every day | ORAL | Status: DC

## 2014-03-10 NOTE — Telephone Encounter (Signed)
Refill- hydrocholothiazide

## 2014-05-05 ENCOUNTER — Other Ambulatory Visit: Payer: Self-pay | Admitting: Family Medicine

## 2014-05-25 ENCOUNTER — Ambulatory Visit: Admitting: Family Medicine

## 2014-06-03 ENCOUNTER — Other Ambulatory Visit: Payer: Self-pay | Admitting: Family Medicine

## 2014-06-06 ENCOUNTER — Other Ambulatory Visit: Payer: Self-pay | Admitting: Family Medicine

## 2014-06-06 NOTE — Telephone Encounter (Signed)
Please call and schedule a follow up. Pt cancelled 3 month at the end of may

## 2014-06-06 NOTE — Telephone Encounter (Signed)
Patient already has appointment scheduled for 07/07/14

## 2014-07-02 ENCOUNTER — Other Ambulatory Visit: Payer: Self-pay | Admitting: Family Medicine

## 2014-07-05 LAB — TSH: TSH: 0.954 u[IU]/mL (ref 0.350–4.500)

## 2014-07-05 LAB — CBC
HCT: 39.3 % (ref 36.0–46.0)
HEMOGLOBIN: 13.5 g/dL (ref 12.0–15.0)
MCH: 30.3 pg (ref 26.0–34.0)
MCHC: 34.4 g/dL (ref 30.0–36.0)
MCV: 88.3 fL (ref 78.0–100.0)
Platelets: 309 10*3/uL (ref 150–400)
RBC: 4.45 MIL/uL (ref 3.87–5.11)
RDW: 12.8 % (ref 11.5–15.5)
WBC: 7.2 10*3/uL (ref 4.0–10.5)

## 2014-07-05 LAB — HEMOGLOBIN A1C
Hgb A1c MFr Bld: 9.6 % — ABNORMAL HIGH (ref <5.7)
Mean Plasma Glucose: 229 mg/dL — ABNORMAL HIGH (ref <117)

## 2014-07-06 LAB — HEPATIC FUNCTION PANEL
ALBUMIN: 3.6 g/dL (ref 3.5–5.2)
ALT: 22 U/L (ref 0–35)
AST: 25 U/L (ref 0–37)
Alkaline Phosphatase: 84 U/L (ref 39–117)
BILIRUBIN TOTAL: 0.5 mg/dL (ref 0.2–1.2)
Bilirubin, Direct: 0.1 mg/dL (ref 0.0–0.3)
Indirect Bilirubin: 0.4 mg/dL (ref 0.2–1.2)
Total Protein: 6.8 g/dL (ref 6.0–8.3)

## 2014-07-06 LAB — RENAL FUNCTION PANEL
Albumin: 3.6 g/dL (ref 3.5–5.2)
BUN: 20 mg/dL (ref 6–23)
CALCIUM: 9.4 mg/dL (ref 8.4–10.5)
CO2: 26 mEq/L (ref 19–32)
CREATININE: 0.8 mg/dL (ref 0.50–1.10)
Chloride: 97 mEq/L (ref 96–112)
Glucose, Bld: 304 mg/dL — ABNORMAL HIGH (ref 70–99)
PHOSPHORUS: 3.1 mg/dL (ref 2.3–4.6)
POTASSIUM: 5.1 meq/L (ref 3.5–5.3)
Sodium: 134 mEq/L — ABNORMAL LOW (ref 135–145)

## 2014-07-06 LAB — LIPID PANEL
Cholesterol: 105 mg/dL (ref 0–200)
HDL: 21 mg/dL — ABNORMAL LOW (ref >39)
Total CHOL/HDL Ratio: 5 Ratio
Triglycerides: 446 mg/dL — ABNORMAL HIGH (ref <150)

## 2014-07-07 ENCOUNTER — Encounter: Payer: Self-pay | Admitting: Family Medicine

## 2014-07-07 ENCOUNTER — Ambulatory Visit (INDEPENDENT_AMBULATORY_CARE_PROVIDER_SITE_OTHER): Admitting: Family Medicine

## 2014-07-07 VITALS — BP 140/100 | HR 90 | Temp 97.0°F | Ht 67.5 in | Wt 286.0 lb

## 2014-07-07 DIAGNOSIS — E669 Obesity, unspecified: Secondary | ICD-10-CM

## 2014-07-07 DIAGNOSIS — J0100 Acute maxillary sinusitis, unspecified: Secondary | ICD-10-CM

## 2014-07-07 DIAGNOSIS — I10 Essential (primary) hypertension: Secondary | ICD-10-CM

## 2014-07-07 DIAGNOSIS — E1159 Type 2 diabetes mellitus with other circulatory complications: Secondary | ICD-10-CM

## 2014-07-07 DIAGNOSIS — E785 Hyperlipidemia, unspecified: Secondary | ICD-10-CM

## 2014-07-07 MED ORDER — METFORMIN HCL ER (MOD) 1000 MG PO TB24: 1000.0000 mg | ORAL_TABLET | Freq: Every day | ORAL | Status: DC

## 2014-07-07 MED ORDER — CEFDINIR 300 MG PO CAPS: 300.0000 mg | ORAL_CAPSULE | Freq: Two times a day (BID) | ORAL | Status: AC

## 2014-07-07 MED ORDER — GLIPIZIDE 5 MG PO TABS: 5.0000 mg | ORAL_TABLET | Freq: Two times a day (BID) | ORAL | Status: DC

## 2014-07-07 MED ORDER — FLUCONAZOLE 150 MG PO TABS: 150.0000 mg | ORAL_TABLET | ORAL | Status: DC

## 2014-07-07 NOTE — Assessment & Plan Note (Signed)
Poorly controlled but improved on recheck. With acute illness will not change meds, encouraged DASH diet, minimize caffeine and obtain adequate sleep. Report concerning symptoms and follow up as directed and as needed.

## 2014-07-07 NOTE — Progress Notes (Signed)
Patient ID: Estreya Clay, female   DOB: 1971-08-27, 43 y.o.   MRN: [id number] Antoniette Peake [id number] [birth year] 07/07/2014      Progress Note-Follow Up  Subjective  Chief Complaint  Chief Complaint  Patient presents with  . Follow-up    HPI  Patient is a 43 year old female in today for routine medical care. In today for followup. Reports her blood pressures were well-controlled until she recently became ill she has been using small amount of Aleve for back pain with good results. She's had a cough for 3 weeks. Productive of green phlegm. Has had some low-grade fevers and chills. Experiencing malaise and myalgias. She is noted. Denies polyuria or polydipsia. Denies CP/palp/SOB/HA/GI or GU c/o. Taking meds as prescribed  Past Medical History  Diagnosis Date  . Diabetes mellitus 08-12-12    type 2- dr Zigmund Daniel diagnosed  . Chicken pox 43 yrs old  . Diabetic retinopathy   . Obesity   . HTN (hypertension) 08/20/2012  . Allergy   . Preventative health care 08/20/2012  . Dysmenorrhea 08/20/2012  . Other and unspecified hyperlipidemia 08/28/2013  . Obesity, unspecified 08/28/2013  . Sinusitis 11/14/2013    Past Surgical History  Procedure Laterality Date  . Refractive surgery  08-12-12    right, left on 09/16/12    Family History  Problem Relation Age of Onset  . Cancer Mother 9    breast  . Diabetes Mother     type 2  . Hypertension Mother   . Hypertension Father   . Hyperlipidemia Father   . COPD Father   . Hypertension Maternal Grandmother   . Hyperlipidemia Maternal Grandmother   . COPD Maternal Grandmother   . Asthma Maternal Grandmother   . Hypertension Maternal Grandfather   . Heart attack Maternal Grandfather   . Gout Brother   . Cancer Brother     lung  . Heart disease Paternal Grandfather     History   Social History  . Marital Status: Married    Spouse Name: N/A    Number of Children: N/A  . Years of Education: N/A   Occupational History  . Not on file.    Social History Main Topics  . Smoking status: Never Smoker   . Smokeless tobacco: Never Used  . Alcohol Use: Yes     Comment: socially  . Drug Use: No  . Sexual Activity: Yes    Partners: Male   Other Topics Concern  . Not on file   Social History Narrative  . No narrative on file    Current Outpatient Prescriptions on File Prior to Visit  Medication Sig Dispense Refill  . atorvastatin (LIPITOR) 10 MG tablet TAKE 1 TABLET (10 MG TOTAL) BY MOUTH DAILY.  30 tablet  3  . Blood Glucose Monitoring Suppl (ONE TOUCH BASIC SYSTEM) W/DEVICE KIT Test bid prn  1 each  0  . glucose blood (ONE TOUCH TEST STRIPS) test strip 1 each by Other route 2 (two) times daily as needed for other.  100 each  12  . hydrochlorothiazide (HYDRODIURIL) 25 MG tablet TAKE 1 TABLET (25 MG TOTAL) BY MOUTH DAILY.  30 tablet  2  . ibuprofen (ADVIL,MOTRIN) 200 MG tablet Take 400 mg by mouth every 8 (eight) hours as needed.      Marland Kitchen lisinopril (PRINIVIL,ZESTRIL) 20 MG tablet TAKE 1 TABLET (20 MG TOTAL) BY MOUTH 2 (TWO) TIMES DAILY.  60 tablet  0  . LORazepam (ATIVAN) 0.5 MG tablet Take 1 tablet (  0.5 mg total) by mouth 2 (two) times daily as needed for anxiety or sleep.  40 tablet  2  . metFORMIN (GLUMETZA) 500 MG (MOD) 24 hr tablet 2 tabs po bid and 1 tab po q noon  150 tablet  3  . ONE TOUCH LANCETS MISC Check bid and as prn  200 each  12   No current facility-administered medications on file prior to visit.    No Known Allergies  Review of Systems  Review of Systems  Constitutional: Positive for malaise/fatigue. Negative for fever.  HENT: Positive for congestion.   Eyes: Negative for discharge.  Respiratory: Positive for cough, sputum production and shortness of breath.   Cardiovascular: Negative for chest pain, palpitations and leg swelling.  Gastrointestinal: Negative for nausea, abdominal pain and diarrhea.  Genitourinary: Negative for dysuria.  Musculoskeletal: Negative for falls.  Skin: Negative for  rash.  Neurological: Negative for loss of consciousness and headaches.  Endo/Heme/Allergies: Negative for polydipsia.  Psychiatric/Behavioral: Negative for depression and suicidal ideas. The patient is not nervous/anxious and does not have insomnia.     Objective  BP 140/100  Pulse 90  Temp(Src) 97 F (36.1 C) (Oral)  Ht 5' 7.5" (1.715 m)  Wt 286 lb 0.6 oz (129.747 kg)  BMI 44.11 kg/m2  SpO2 97%  LMP 06/14/2014  Physical Exam  Physical Exam  Constitutional: She is oriented to person, place, and time and well-developed, well-nourished, and in no distress. No distress.  HENT:  Head: Normocephalic and atraumatic.  Eyes: Conjunctivae are normal.  Neck: Neck supple. No thyromegaly present.  Cardiovascular: Normal rate, regular rhythm and normal heart sounds.   No murmur heard. Pulmonary/Chest: Effort normal and breath sounds normal. She has no wheezes.  Abdominal: She exhibits no distension and no mass.  Musculoskeletal: She exhibits no edema.  Lymphadenopathy:    She has no cervical adenopathy.  Neurological: She is alert and oriented to person, place, and time.  Skin: Skin is warm and dry. No rash noted. She is not diaphoretic.  Psychiatric: Memory, affect and judgment normal.    Lab Results  Component Value Date   TSH 0.954 07/05/2014   Lab Results  Component Value Date   WBC 7.2 07/05/2014   HGB 13.5 07/05/2014   HCT 39.3 07/05/2014   MCV 88.3 07/05/2014   PLT 309 07/05/2014   Lab Results  Component Value Date   CREATININE 0.80 07/05/2014   BUN 20 07/05/2014   NA 134* 07/05/2014   K 5.1 07/05/2014   CL 97 07/05/2014   CO2 26 07/05/2014   Lab Results  Component Value Date   ALT 22 07/05/2014   AST 25 07/05/2014   ALKPHOS 84 07/05/2014   BILITOT 0.5 07/05/2014   Lab Results  Component Value Date   CHOL 105 07/05/2014   Lab Results  Component Value Date   HDL 21* 07/05/2014   Lab Results  Component Value Date   LDLCALC NOT CALC 07/05/2014   Lab Results  Component Value Date    TRIG 446* 07/05/2014   Lab Results  Component Value Date   CHOLHDL 5.0 07/05/2014     Assessment & Plan  HTN (hypertension) Poorly controlled but improved on recheck. With acute illness will not change meds, encouraged DASH diet, minimize caffeine and obtain adequate sleep. Report concerning symptoms and follow up as directed and as needed.   Diabetes mellitus hgba1c elevated, minimize simple carbs. Increase exercise as tolerated. Continue current meds. Add Glipizide  5 mg bid, continue  Glumetza at 1000 mg po bid, if no improvement in constipation  Obesity Encouraged DASH diet, decrease po intake and increase exercise as tolerated. Needs 7-8 hours of sleep nightly. Avoid trans fats, eat small, frequent meals every 4-5 hours with lean proteins, complex carbs and healthy fats. Minimize simple carbs, GMO foods.  Other and unspecified hyperlipidemia Tolerating statin, encouraged heart healthy diet, avoid trans fats, minimize simple carbs and saturated fats. Increase exercise as tolerated  Sinusitis Encouraged increased rest and hydration, add probiotics, zinc such as Coldeze or Xicam. Treat fevers as needed. Given rx on Cefdinir

## 2014-07-07 NOTE — Assessment & Plan Note (Signed)
Tolerating statin, encouraged heart healthy diet, avoid trans fats, minimize simple carbs and saturated fats. Increase exercise as tolerated 

## 2014-07-07 NOTE — Assessment & Plan Note (Signed)
Encouraged DASH diet, decrease po intake and increase exercise as tolerated. Needs 7-8 hours of sleep nightly. Avoid trans fats, eat small, frequent meals every 4-5 hours with lean proteins, complex carbs and healthy fats. Minimize simple carbs, GMO foods. 

## 2014-07-07 NOTE — Assessment & Plan Note (Signed)
Encouraged increased rest and hydration, add probiotics, zinc such as Coldeze or Xicam. Treat fevers as needed. Given rx on Cefdinir 

## 2014-07-07 NOTE — Assessment & Plan Note (Signed)
hgba1c elevated, minimize simple carbs. Increase exercise as tolerated. Continue current meds. Add Glipizide  5 mg bid, continue Glumetza at 1000 mg po bid, if no improvement in constipation

## 2014-07-07 NOTE — Progress Notes (Signed)
Pre visit review using our clinic review tool, if applicable. No additional management support is needed unless otherwise documented below in the visit note. 

## 2014-07-07 NOTE — Patient Instructions (Addendum)
Encouraged increased rest and hydration, add probiotics, zinc such as Coldeze or Xicam. Treat fevers as needed Digestive Advantage or Phillip's Colon Health daily 64 oz clear fluids  Basic Carbohydrate Counting for Diabetes Mellitus Carbohydrate counting is a method for keeping track of the amount of carbohydrates you eat. Eating carbohydrates naturally increases the level of sugar (glucose) in your blood, so it is important for you to know the amount that is okay for you to have in every meal. Carbohydrate counting helps keep the level of glucose in your blood within normal limits. The amount of carbohydrates allowed is different for every person. A dietitian can help you calculate the amount that is right for you. Once you know the amount of carbohydrates you can have, you can count the carbohydrates in the foods you want to eat. Carbohydrates are found in the following foods:  Grains, such as breads and cereals.  Dried beans and soy products.  Starchy vegetables, such as potatoes, peas, and corn.  Fruit and fruit juices.  Milk and yogurt.  Sweets and snack foods, such as cake, cookies, candy, chips, soft drinks, and fruit drinks. CARBOHYDRATE COUNTING There are two ways to count the carbohydrates in your food. You can use either of the methods or a combination of both. Reading the "Nutrition Facts" on Packaged Food The "Nutrition Facts" is an area that is included on the labels of almost all packaged food and beverages in the [place]. It includes the serving size of that food or beverage and information about the nutrients in each serving of the food, including the grams (g) of carbohydrate per serving.  Decide the number of servings of this food or beverage that you will be able to eat or drink. Multiply that number of servings by the number of grams of carbohydrate that is listed on the label for that serving. The total will be the amount of carbohydrates you will be having when you  eat or drink this food or beverage. Learning Standard Serving Sizes of Food When you eat food that is not packaged or does not include "Nutrition Facts" on the label, you need to measure the servings in order to count the amount of carbohydrates.A serving of most carbohydrate-rich foods contains about 15 g of carbohydrates. The following list includes serving sizes of carbohydrate-rich foods that provide 15 g ofcarbohydrate per serving:   1 slice of bread (1 oz) or 1 six-inch tortilla.    of a hamburger bun or English muffin.  4-6 crackers.   cup unsweetened dry cereal.    cup hot cereal.   cup rice or pasta.    cup mashed potatoes or  of a large baked potato.  1 cup fresh fruit or one small piece of fruit.    cup canned or frozen fruit or fruit juice.  1 cup milk.   cup plain fat-free yogurt or yogurt sweetened with artificial sweeteners.   cup cooked dried beans or starchy vegetable, such as peas, corn, or potatoes.  Decide the number of standard-size servings that you will eat. Multiply that number of servings by 15 (the grams of carbohydrates in that serving). For example, if you eat 2 cups of strawberries, you will have eaten 2 servings and 30 g of carbohydrates (2 servings x 15 g = 30 g). For foods such as soups and casseroles, in which more than one food is mixed in, you will need to count the carbohydrates in each food that is included. EXAMPLE OF CARBOHYDRATE  COUNTING Sample Dinner  3 oz chicken breast.   cup of brown rice.   cup of corn.  1 cup milk.   1 cup strawberries with sugar-free whipped topping.  Carbohydrate Calculation Step 1: Identify the foods that contain carbohydrates:   Rice.   Corn.   Milk.   Strawberries. Step 2:Calculate the number of servings eaten of each:   2 servings of rice.   1 serving of corn.   1 serving of milk.   1 serving of strawberries. Step 3: Multiply each of those number of servings by  15 g:   2 servings of rice x 15 g = 30 g.   1 serving of corn x 15 g = 15 g.   1 serving of milk x 15 g = 15 g.   1 serving of strawberries x 15 g = 15 g. Step 4: Add together all of the amounts to find the total grams of carbohydrates eaten: 30 g + 15 g + 15 g + 15 g = 75 g. Document Released: 12/15/2005 Document Revised: 12/20/2013 Document Reviewed: 11/11/2013 [facility] Patient Information 2015 [place], [place]. This information is not intended to replace advice given to you by your health care provider. Make sure you discuss any questions you have with your health care provider.

## 2014-08-05 ENCOUNTER — Other Ambulatory Visit: Payer: Self-pay | Admitting: Family Medicine

## 2014-08-07 NOTE — Telephone Encounter (Signed)
RX SENT TO PHARMACY/LDM 

## 2014-08-16 ENCOUNTER — Telehealth: Payer: Self-pay

## 2014-08-16 NOTE — Telephone Encounter (Signed)
Diabetic bundle  Pt has an appt on 10-13-14 and we can recheck A1C and BP then

## 2014-08-30 ENCOUNTER — Other Ambulatory Visit: Payer: Self-pay | Admitting: Family Medicine

## 2014-08-31 ENCOUNTER — Other Ambulatory Visit: Payer: Self-pay | Admitting: Family Medicine

## 2014-09-29 ENCOUNTER — Other Ambulatory Visit: Payer: Self-pay | Admitting: Family Medicine

## 2014-10-11 ENCOUNTER — Telehealth: Payer: Self-pay | Admitting: Family Medicine

## 2014-10-11 DIAGNOSIS — E785 Hyperlipidemia, unspecified: Secondary | ICD-10-CM

## 2014-10-11 DIAGNOSIS — Z0000 Encounter for general adult medical examination without abnormal findings: Secondary | ICD-10-CM

## 2014-10-11 DIAGNOSIS — I10 Essential (primary) hypertension: Secondary | ICD-10-CM

## 2014-10-11 NOTE — Telephone Encounter (Signed)
Caller name: Fayrene FearingJames  Call back number:  [phone]   Reason for call: pt was instructed to eturn in about 3 months (around 10/07/2014) for lipid, renal, cbc, tsh, hepatic, hgba1c prior; can we get orders placed.  Thanks.

## 2014-10-13 ENCOUNTER — Ambulatory Visit: Admitting: Family Medicine

## 2014-10-18 ENCOUNTER — Other Ambulatory Visit: Payer: Self-pay | Admitting: Family Medicine

## 2014-10-26 ENCOUNTER — Other Ambulatory Visit: Payer: Self-pay | Admitting: Family Medicine

## 2014-11-08 ENCOUNTER — Ambulatory Visit (INDEPENDENT_AMBULATORY_CARE_PROVIDER_SITE_OTHER): Payer: BC Managed Care – PPO | Admitting: Ophthalmology

## 2014-11-08 DIAGNOSIS — H35033 Hypertensive retinopathy, bilateral: Secondary | ICD-10-CM | POA: Diagnosis not present

## 2014-11-08 DIAGNOSIS — H2513 Age-related nuclear cataract, bilateral: Secondary | ICD-10-CM | POA: Diagnosis not present

## 2014-11-08 DIAGNOSIS — E11329 Type 2 diabetes mellitus with mild nonproliferative diabetic retinopathy without macular edema: Secondary | ICD-10-CM | POA: Diagnosis not present

## 2014-11-08 DIAGNOSIS — H43813 Vitreous degeneration, bilateral: Secondary | ICD-10-CM

## 2014-11-08 DIAGNOSIS — I10 Essential (primary) hypertension: Secondary | ICD-10-CM | POA: Diagnosis not present

## 2014-11-08 DIAGNOSIS — E11321 Type 2 diabetes mellitus with mild nonproliferative diabetic retinopathy with macular edema: Secondary | ICD-10-CM | POA: Diagnosis not present

## 2014-11-08 DIAGNOSIS — E11319 Type 2 diabetes mellitus with unspecified diabetic retinopathy without macular edema: Secondary | ICD-10-CM | POA: Diagnosis not present

## 2014-11-10 ENCOUNTER — Ambulatory Visit: Admitting: Family Medicine

## 2014-11-17 ENCOUNTER — Ambulatory Visit (INDEPENDENT_AMBULATORY_CARE_PROVIDER_SITE_OTHER): Payer: BC Managed Care – PPO | Admitting: Family Medicine

## 2014-11-17 ENCOUNTER — Ambulatory Visit: Admitting: Family Medicine

## 2014-11-17 ENCOUNTER — Encounter: Payer: Self-pay | Admitting: Family Medicine

## 2014-11-17 VITALS — BP 124/78 | HR 83 | Temp 98.2°F | Ht 67.5 in | Wt 294.0 lb

## 2014-11-17 DIAGNOSIS — E1169 Type 2 diabetes mellitus with other specified complication: Secondary | ICD-10-CM

## 2014-11-17 DIAGNOSIS — E785 Hyperlipidemia, unspecified: Secondary | ICD-10-CM

## 2014-11-17 DIAGNOSIS — E669 Obesity, unspecified: Secondary | ICD-10-CM

## 2014-11-17 DIAGNOSIS — I10 Essential (primary) hypertension: Secondary | ICD-10-CM

## 2014-11-17 DIAGNOSIS — E119 Type 2 diabetes mellitus without complications: Secondary | ICD-10-CM

## 2014-11-17 LAB — RENAL FUNCTION PANEL
Albumin: 4 g/dL (ref 3.5–5.2)
BUN: 15 mg/dL (ref 6–23)
CALCIUM: 9.1 mg/dL (ref 8.4–10.5)
CO2: 27 mEq/L (ref 19–32)
CREATININE: 0.7 mg/dL (ref 0.50–1.10)
Chloride: 101 mEq/L (ref 96–112)
GLUCOSE: 85 mg/dL (ref 70–99)
PHOSPHORUS: 3.4 mg/dL (ref 2.3–4.6)
Potassium: 5 mEq/L (ref 3.5–5.3)
Sodium: 138 mEq/L (ref 135–145)

## 2014-11-17 LAB — LIPID PANEL
Cholesterol: 113 mg/dL (ref 0–200)
HDL: 24 mg/dL — AB (ref >39)
LDL Cholesterol: 46 mg/dL (ref 0–99)
Total CHOL/HDL Ratio: 4.7 Ratio
Triglycerides: 217 mg/dL — ABNORMAL HIGH (ref <150)
VLDL: 43 mg/dL — ABNORMAL HIGH (ref 0–40)

## 2014-11-17 LAB — HEPATIC FUNCTION PANEL
ALBUMIN: 4 g/dL (ref 3.5–5.2)
ALT: 13 U/L (ref 0–35)
AST: 16 U/L (ref 0–37)
Alkaline Phosphatase: 71 U/L (ref 39–117)
BILIRUBIN INDIRECT: 0.4 mg/dL (ref 0.2–1.2)
Bilirubin, Direct: 0.1 mg/dL (ref 0.0–0.3)
Total Bilirubin: 0.5 mg/dL (ref 0.2–1.2)
Total Protein: 6.8 g/dL (ref 6.0–8.3)

## 2014-11-17 MED ORDER — METFORMIN HCL 1000 MG PO TABS: 1000.0000 mg | ORAL_TABLET | Freq: Two times a day (BID) | ORAL | Status: DC

## 2014-11-17 NOTE — Patient Instructions (Signed)
Metformin 2500 mg/24 hours  Basic Carbohydrate Counting for Diabetes Mellitus Carbohydrate counting is a method for keeping track of the amount of carbohydrates you eat. Eating carbohydrates naturally increases the level of sugar (glucose) in your blood, so it is important for you to know the amount that is okay for you to have in every meal. Carbohydrate counting helps keep the level of glucose in your blood within normal limits. The amount of carbohydrates allowed is different for every person. A dietitian can help you calculate the amount that is right for you. Once you know the amount of carbohydrates you can have, you can count the carbohydrates in the foods you want to eat. Carbohydrates are found in the following foods:  Grains, such as breads and cereals.  Dried beans and soy products.  Starchy vegetables, such as potatoes, peas, and corn.  Fruit and fruit juices.  Milk and yogurt.  Sweets and snack foods, such as cake, cookies, candy, chips, soft drinks, and fruit drinks. CARBOHYDRATE COUNTING There are two ways to count the carbohydrates in your food. You can use either of the methods or a combination of both. Reading the "Nutrition Facts" on Packaged Food The "Nutrition Facts" is an area that is included on the labels of almost all packaged food and beverages in the [place]. It includes the serving size of that food or beverage and information about the nutrients in each serving of the food, including the grams (g) of carbohydrate per serving.  Decide the number of servings of this food or beverage that you will be able to eat or drink. Multiply that number of servings by the number of grams of carbohydrate that is listed on the label for that serving. The total will be the amount of carbohydrates you will be having when you eat or drink this food or beverage. Learning Standard Serving Sizes of Food When you eat food that is not packaged or does not include "Nutrition Facts"  on the label, you need to measure the servings in order to count the amount of carbohydrates.A serving of most carbohydrate-rich foods contains about 15 g of carbohydrates. The following list includes serving sizes of carbohydrate-rich foods that provide 15 g ofcarbohydrate per serving:   1 slice of bread (1 oz) or 1 six-inch tortilla.    of a hamburger bun or English muffin.  4-6 crackers.   cup unsweetened dry cereal.    cup hot cereal.   cup rice or pasta.    cup mashed potatoes or  of a large baked potato.  1 cup fresh fruit or one small piece of fruit.    cup canned or frozen fruit or fruit juice.  1 cup milk.   cup plain fat-free yogurt or yogurt sweetened with artificial sweeteners.   cup cooked dried beans or starchy vegetable, such as peas, corn, or potatoes.  Decide the number of standard-size servings that you will eat. Multiply that number of servings by 15 (the grams of carbohydrates in that serving). For example, if you eat 2 cups of strawberries, you will have eaten 2 servings and 30 g of carbohydrates (2 servings x 15 g = 30 g). For foods such as soups and casseroles, in which more than one food is mixed in, you will need to count the carbohydrates in each food that is included. EXAMPLE OF CARBOHYDRATE COUNTING Sample Dinner  3 oz chicken breast.   cup of brown rice.   cup of corn.  1 cup milk.  1 cup strawberries with sugar-free whipped topping.  Carbohydrate Calculation Step 1: Identify the foods that contain carbohydrates:   Rice.   Corn.   Milk.   Strawberries. Step 2:Calculate the number of servings eaten of each:   2 servings of rice.   1 serving of corn.   1 serving of milk.   1 serving of strawberries. Step 3: Multiply each of those number of servings by 15 g:   2 servings of rice x 15 g = 30 g.   1 serving of corn x 15 g = 15 g.   1 serving of milk x 15 g = 15 g.   1 serving of strawberries x 15  g = 15 g. Step 4: Add together all of the amounts to find the total grams of carbohydrates eaten: 30 g + 15 g + 15 g + 15 g = 75 g. Document Released: 12/15/2005 Document Revised: 05/01/2014 Document Reviewed: 11/11/2013 [facility] Patient Information 2015 [place], [place]. This information is not intended to replace advice given to you by your health care provider. Make sure you discuss any questions you have with your health care provider.

## 2014-11-17 NOTE — Progress Notes (Signed)
Pre visit review using our clinic review tool, if applicable. No additional management support is needed unless otherwise documented below in the visit note. 

## 2014-11-18 LAB — CBC WITH DIFFERENTIAL/PLATELET
BASOS ABS: 0 10*3/uL (ref 0.0–0.1)
Basophils Relative: 0 % (ref 0–1)
Eosinophils Absolute: 0.2 10*3/uL (ref 0.0–0.7)
Eosinophils Relative: 2 % (ref 0–5)
HCT: 38.3 % (ref 36.0–46.0)
Hemoglobin: 12.9 g/dL (ref 12.0–15.0)
Lymphocytes Relative: 26 % (ref 12–46)
Lymphs Abs: 2.8 10*3/uL (ref 0.7–4.0)
MCH: 29.5 pg (ref 26.0–34.0)
MCHC: 33.7 g/dL (ref 30.0–36.0)
MCV: 87.4 fL (ref 78.0–100.0)
MONO ABS: 0.6 10*3/uL (ref 0.1–1.0)
MPV: 9.5 fL (ref 9.4–12.4)
Monocytes Relative: 6 % (ref 3–12)
NEUTROS ABS: 7 10*3/uL (ref 1.7–7.7)
NEUTROS PCT: 66 % (ref 43–77)
PLATELETS: 349 10*3/uL (ref 150–400)
RBC: 4.38 MIL/uL (ref 3.87–5.11)
RDW: 13.1 % (ref 11.5–15.5)
WBC: 10.6 10*3/uL — AB (ref 4.0–10.5)

## 2014-11-18 LAB — TSH: TSH: 0.968 u[IU]/mL (ref 0.350–4.500)

## 2014-11-18 LAB — HEMOGLOBIN A1C
Hgb A1c MFr Bld: 7.3 % — ABNORMAL HIGH (ref <5.7)
Mean Plasma Glucose: 163 mg/dL — ABNORMAL HIGH (ref <117)

## 2014-11-19 ENCOUNTER — Encounter: Payer: Self-pay | Admitting: Family Medicine

## 2014-11-19 NOTE — Assessment & Plan Note (Signed)
Encouraged DASH diet, decrease po intake and increase exercise as tolerated. Needs 7-8 hours of sleep nightly. Avoid trans fats, eat small, frequent meals every 4-5 hours with lean proteins, complex carbs and healthy fats. Minimize simple carbs 

## 2014-11-19 NOTE — Assessment & Plan Note (Signed)
Well controlled, no changes to meds. Encouraged heart healthy diet such as the DASH diet and exercise as tolerated.  °

## 2014-11-19 NOTE — Assessment & Plan Note (Signed)
hgba1c acceptable, minimize simple carbs. Increase exercise as tolerated. Continue current meds. Needs max dose of Metformin short acting worked the best, 1000 bid and 500 mg q noon

## 2014-11-19 NOTE — Assessment & Plan Note (Signed)
Tolerating statin, encouraged heart healthy diet, avoid trans fats, minimize simple carbs and saturated fats. Increase exercise as tolerated 

## 2014-11-19 NOTE — Progress Notes (Signed)
April Warren  [id number] 03/28/1971 11/19/2014      Progress Note-Follow Up  Subjective  Chief Complaint  Chief Complaint  Patient presents with  . Follow-up    3 month    HPI  Patient is a 43 y.o. female in today for routine medical care. She has been taking between 2000 and 3000 mg of metformin without difficulty, she reports a low sugar of 137 and a high of 187 in past couple of weeks. No recent illness or acute concern. Bowels are moving with a stool softener, fiber and a probiotic. Denies CP/palp/SOB/HA/congestion/fevers/GI or GU c/o. Taking meds as prescribed Past Medical History  Diagnosis Date  . Diabetes mellitus 08-12-12    type 2- dr Zigmund Daniel diagnosed  . Chicken pox 43 yrs old  . Diabetic retinopathy   . Obesity   . HTN (hypertension) 08/20/2012  . Allergy   . Preventative health care 08/20/2012  . Dysmenorrhea 08/20/2012  . Other and unspecified hyperlipidemia 08/28/2013  . Obesity, unspecified 08/28/2013  . Sinusitis 11/14/2013    Past Surgical History  Procedure Laterality Date  . Refractive surgery  08-12-12    right, left on 09/16/12    Family History  Problem Relation Age of Onset  . Cancer Mother 84    breast  . Diabetes Mother     type 2  . Hypertension Mother   . Hypertension Father   . Hyperlipidemia Father   . COPD Father   . Hypertension Maternal Grandmother   . Hyperlipidemia Maternal Grandmother   . COPD Maternal Grandmother   . Asthma Maternal Grandmother   . Hypertension Maternal Grandfather   . Heart attack Maternal Grandfather   . Gout Brother   . Cancer Brother     lung  . Heart disease Paternal Grandfather     History   Social History  . Marital Status: Married    Spouse Name: N/A    Number of Children: N/A  . Years of Education: N/A   Occupational History  . Not on file.   Social History Main Topics  . Smoking status: Never Smoker   . Smokeless tobacco: Never Used  . Alcohol Use: Yes     Comment: socially  . Drug  Use: No  . Sexual Activity:    Partners: Male   Other Topics Concern  . Not on file   Social History Narrative    Current Outpatient Prescriptions on File Prior to Visit  Medication Sig Dispense Refill  . atorvastatin (LIPITOR) 10 MG tablet TAKE 1 TABLET (10 MG TOTAL) BY MOUTH DAILY. 30 tablet 3  . Blood Glucose Monitoring Suppl (ONE TOUCH BASIC SYSTEM) W/DEVICE KIT Test bid prn 1 each 0  . glipiZIDE (GLUCOTROL) 5 MG tablet TAKE 1 TABLET (5 MG TOTAL) BY MOUTH 2 (TWO) TIMES DAILY BEFORE A MEAL. 60 tablet 2  . hydrochlorothiazide (HYDRODIURIL) 25 MG tablet TAKE 1 TABLET (25 MG TOTAL) BY MOUTH DAILY. 30 tablet 2  . ibuprofen (ADVIL,MOTRIN) 200 MG tablet Take 400 mg by mouth every 8 (eight) hours as needed.    Marland Kitchen lisinopril (PRINIVIL,ZESTRIL) 20 MG tablet TAKE 1 TABLET (20 MG TOTAL) BY MOUTH 2 (TWO) TIMES DAILY. 60 tablet 2  . LORazepam (ATIVAN) 0.5 MG tablet Take 1 tablet (0.5 mg total) by mouth 2 (two) times daily as needed for anxiety or sleep. 40 tablet 2  . ONE TOUCH LANCETS MISC Check bid and as prn 200 each 12  . ONE TOUCH ULTRA TEST test strip TEST  TWICE A DAY 100 each 1   No current facility-administered medications on file prior to visit.    No Known Allergies  Review of Systems  Review of Systems  Constitutional: Negative for fever and malaise/fatigue.  HENT: Negative for congestion.   Eyes: Negative for discharge.  Respiratory: Negative for shortness of breath.   Cardiovascular: Negative for chest pain, palpitations and leg swelling.  Gastrointestinal: Negative for nausea, abdominal pain and diarrhea.  Genitourinary: Negative for dysuria.  Musculoskeletal: Negative for falls.  Skin: Negative for rash.  Neurological: Negative for loss of consciousness and headaches.  Endo/Heme/Allergies: Negative for polydipsia.  Psychiatric/Behavioral: Negative for depression and suicidal ideas. The patient is not nervous/anxious and does not have insomnia.     Objective  BP  124/78 mmHg  Pulse 83  Temp(Src) 98.2 F (36.8 C) (Oral)  Ht 5' 7.5" (1.715 m)  Wt 294 lb (133.358 kg)  BMI 45.34 kg/m2  SpO2 100%  LMP 10/28/2014  Physical Exam  Physical Exam  Constitutional: She is oriented to person, place, and time and well-developed, well-nourished, and in no distress. No distress.  HENT:  Head: Normocephalic and atraumatic.  Eyes: Conjunctivae are normal.  Neck: Neck supple. No thyromegaly present.  Cardiovascular: Normal rate, regular rhythm and normal heart sounds.   No murmur heard. Pulmonary/Chest: Effort normal and breath sounds normal. She has no wheezes.  Abdominal: She exhibits no distension and no mass.  Musculoskeletal: She exhibits no edema.  Lymphadenopathy:    She has no cervical adenopathy.  Neurological: She is alert and oriented to person, place, and time.  Skin: Skin is warm and dry. No rash noted. She is not diaphoretic.  Psychiatric: Memory, affect and judgment normal.    Lab Results  Component Value Date   TSH 0.968 11/17/2014   Lab Results  Component Value Date   WBC 10.6* 11/17/2014   HGB 12.9 11/17/2014   HCT 38.3 11/17/2014   MCV 87.4 11/17/2014   PLT 349 11/17/2014   Lab Results  Component Value Date   CREATININE 0.70 11/17/2014   BUN 15 11/17/2014   NA 138 11/17/2014   K 5.0 11/17/2014   CL 101 11/17/2014   CO2 27 11/17/2014   Lab Results  Component Value Date   ALT 13 11/17/2014   AST 16 11/17/2014   ALKPHOS 71 11/17/2014   BILITOT 0.5 11/17/2014   Lab Results  Component Value Date   CHOL 113 11/17/2014   Lab Results  Component Value Date   HDL 24* 11/17/2014   Lab Results  Component Value Date   LDLCALC 46 11/17/2014   Lab Results  Component Value Date   TRIG 217* 11/17/2014   Lab Results  Component Value Date   CHOLHDL 4.7 11/17/2014     Assessment & Plan  HTN (hypertension) Well controlled, no changes to meds. Encouraged heart healthy diet such as the DASH diet and exercise as  tolerated.   Diabetes mellitus type 2 in obese hgba1c acceptable, minimize simple carbs. Increase exercise as tolerated. Continue current meds. Needs max dose of Metformin short acting worked the best, 1000 bid and 500 mg q noon  Hyperlipemia Tolerating statin, encouraged heart healthy diet, avoid trans fats, minimize simple carbs and saturated fats. Increase exercise as tolerated  Obesity Encouraged DASH diet, decrease po intake and increase exercise as tolerated. Needs 7-8 hours of sleep nightly. Avoid trans fats, eat small, frequent meals every 4-5 hours with lean proteins, complex carbs and healthy fats. Minimize simple carbs

## 2014-11-20 MED ORDER — ATORVASTATIN CALCIUM 20 MG PO TABS: 20.0000 mg | ORAL_TABLET | Freq: Every day | ORAL | Status: DC

## 2014-11-20 NOTE — Addendum Note (Signed)
Addended by: Court JoyFREEMAN, Advait Buice L on: 11/20/2014 07:39 AM   Modules accepted: Orders, Medications

## 2014-11-22 ENCOUNTER — Other Ambulatory Visit: Payer: Self-pay | Admitting: Family Medicine

## 2014-12-07 ENCOUNTER — Encounter: Payer: Self-pay | Admitting: Family Medicine

## 2014-12-08 ENCOUNTER — Other Ambulatory Visit: Payer: Self-pay | Admitting: *Deleted

## 2014-12-08 DIAGNOSIS — E785 Hyperlipidemia, unspecified: Secondary | ICD-10-CM

## 2014-12-08 DIAGNOSIS — E669 Obesity, unspecified: Principal | ICD-10-CM

## 2014-12-08 DIAGNOSIS — E1169 Type 2 diabetes mellitus with other specified complication: Secondary | ICD-10-CM

## 2014-12-08 DIAGNOSIS — I10 Essential (primary) hypertension: Secondary | ICD-10-CM

## 2014-12-08 MED ORDER — METFORMIN HCL 1000 MG PO TABS: 1000.0000 mg | ORAL_TABLET | Freq: Two times a day (BID) | ORAL | Status: DC

## 2014-12-11 ENCOUNTER — Other Ambulatory Visit: Payer: Self-pay | Admitting: *Deleted

## 2014-12-11 MED ORDER — METFORMIN HCL 500 MG PO TABS: 500.0000 mg | ORAL_TABLET | Freq: Every day | ORAL | Status: DC

## 2014-12-17 ENCOUNTER — Encounter: Payer: Self-pay | Admitting: Family Medicine

## 2014-12-19 ENCOUNTER — Telehealth: Payer: BC Managed Care – PPO | Admitting: Nurse Practitioner

## 2014-12-19 DIAGNOSIS — J0191 Acute recurrent sinusitis, unspecified: Secondary | ICD-10-CM

## 2014-12-19 MED ORDER — AMOXICILLIN-POT CLAVULANATE 875-125 MG PO TABS: 1.0000 | ORAL_TABLET | Freq: Two times a day (BID) | ORAL | Status: DC

## 2014-12-19 NOTE — Progress Notes (Signed)

## 2014-12-21 ENCOUNTER — Telehealth: Payer: Self-pay | Admitting: Family Medicine

## 2014-12-21 MED ORDER — FLUCONAZOLE 150 MG PO TABS: ORAL_TABLET | ORAL | Status: DC

## 2014-12-21 NOTE — Telephone Encounter (Signed)
LMOM @ 10:35am @ [phone]) asking the pt to RTC regarding her MyChart message.//AB/CMA

## 2014-12-21 NOTE — Telephone Encounter (Signed)
Rx sent to the pharmacy by e-script.  Pt aware.//AB/CMA 

## 2014-12-21 NOTE — Telephone Encounter (Signed)
Diflucan 150 mg tab po weekly x 2 weeks. Disp #2

## 2014-12-21 NOTE — Telephone Encounter (Signed)
Caller name: loleta Relation to pt: self Call back number: [phone] [facility] [place]  Reason for call:   Patient requesting diflucan to be called in since she will be starting augmentin.

## 2014-12-21 NOTE — Telephone Encounter (Signed)
Pt was given ATB (Augmentin) through E-visit on (12/19/14).  She's requesting Diflucan.//AB/CMA

## 2015-01-16 ENCOUNTER — Other Ambulatory Visit: Payer: Self-pay | Admitting: Family Medicine

## 2015-03-16 ENCOUNTER — Ambulatory Visit (INDEPENDENT_AMBULATORY_CARE_PROVIDER_SITE_OTHER): Payer: BLUE CROSS/BLUE SHIELD | Admitting: Ophthalmology

## 2015-03-16 DIAGNOSIS — H35033 Hypertensive retinopathy, bilateral: Secondary | ICD-10-CM

## 2015-03-16 DIAGNOSIS — E11329 Type 2 diabetes mellitus with mild nonproliferative diabetic retinopathy without macular edema: Secondary | ICD-10-CM

## 2015-03-16 DIAGNOSIS — H43813 Vitreous degeneration, bilateral: Secondary | ICD-10-CM

## 2015-03-16 DIAGNOSIS — I10 Essential (primary) hypertension: Secondary | ICD-10-CM | POA: Diagnosis not present

## 2015-03-16 DIAGNOSIS — E11319 Type 2 diabetes mellitus with unspecified diabetic retinopathy without macular edema: Secondary | ICD-10-CM | POA: Diagnosis not present

## 2015-04-17 ENCOUNTER — Other Ambulatory Visit: Payer: Self-pay | Admitting: Family Medicine

## 2015-04-27 ENCOUNTER — Telehealth: Payer: Self-pay | Admitting: Family Medicine

## 2015-04-27 NOTE — Telephone Encounter (Signed)
Pre visit letter for annual exam

## 2015-05-17 ENCOUNTER — Telehealth: Payer: Self-pay | Admitting: *Deleted

## 2015-05-17 NOTE — Telephone Encounter (Signed)
Unable to reach patient at time of Pre-Visit Call.  Left message for patient to return call when available.    

## 2015-05-18 ENCOUNTER — Ambulatory Visit (INDEPENDENT_AMBULATORY_CARE_PROVIDER_SITE_OTHER): Payer: BLUE CROSS/BLUE SHIELD | Admitting: Family Medicine

## 2015-05-18 ENCOUNTER — Encounter: Payer: Self-pay | Admitting: Family Medicine

## 2015-05-18 VITALS — BP 132/82 | HR 100 | Temp 99.1°F | Ht 68.0 in | Wt 296.5 lb

## 2015-05-18 DIAGNOSIS — Z0000 Encounter for general adult medical examination without abnormal findings: Secondary | ICD-10-CM

## 2015-05-18 DIAGNOSIS — E119 Type 2 diabetes mellitus without complications: Secondary | ICD-10-CM | POA: Diagnosis not present

## 2015-05-18 DIAGNOSIS — E1169 Type 2 diabetes mellitus with other specified complication: Secondary | ICD-10-CM

## 2015-05-18 DIAGNOSIS — E785 Hyperlipidemia, unspecified: Secondary | ICD-10-CM

## 2015-05-18 DIAGNOSIS — E669 Obesity, unspecified: Secondary | ICD-10-CM

## 2015-05-18 DIAGNOSIS — I10 Essential (primary) hypertension: Secondary | ICD-10-CM

## 2015-05-18 MED ORDER — METFORMIN HCL 500 MG PO TABS: 500.0000 mg | ORAL_TABLET | Freq: Every day | ORAL | Status: DC

## 2015-05-18 MED ORDER — GLUCOSE BLOOD VI STRP: ORAL_STRIP | Status: DC

## 2015-05-18 MED ORDER — ATORVASTATIN CALCIUM 20 MG PO TABS: 20.0000 mg | ORAL_TABLET | Freq: Every day | ORAL | Status: DC

## 2015-05-18 MED ORDER — METFORMIN HCL 1000 MG PO TABS: 1000.0000 mg | ORAL_TABLET | Freq: Two times a day (BID) | ORAL | Status: DC

## 2015-05-18 MED ORDER — HYDROCHLOROTHIAZIDE 25 MG PO TABS: ORAL_TABLET | ORAL | Status: DC

## 2015-05-18 MED ORDER — GLIPIZIDE 5 MG PO TABS: ORAL_TABLET | ORAL | Status: DC

## 2015-05-18 MED ORDER — ONETOUCH LANCETS MISC: Status: DC

## 2015-05-18 MED ORDER — LISINOPRIL 20 MG PO TABS: ORAL_TABLET | ORAL | Status: DC

## 2015-05-18 NOTE — Progress Notes (Signed)
Pre visit review using our clinic review tool, if applicable. No additional management support is needed unless otherwise documented below in the visit note. 

## 2015-05-18 NOTE — Progress Notes (Signed)
April Warren  [id number] 1971/01/01 05/18/2015      Progress Note-Follow Up  Subjective  Chief Complaint  Chief Complaint  Patient presents with  . Annual Exam    HPI  Patient is a 44 y.o. female in today for routine medical care.  She is in today for annual exam. Overall feeling well but she does note feeling lightheaded when her blood sugar gets closer to 100. Typically her blood sugars have been ranging from 125-145. She denies polyuria or polydipsia. The lowest blood sugar in the last 2 weeks is been 104 and the highest 156. She has been struggling with some clear rhinorrhea and facial pressure and no fevers or chills. No ear pain or sore throat. No other acute illness. Denies CP/palp/SOB/HA/congestion/fevers/GI or GU c/o. Taking meds as prescribed  Past Medical History  Diagnosis Date  . Diabetes mellitus 08-12-12    type 2- dr Zigmund Daniel diagnosed  . Chicken pox 44 yrs old  . Diabetic retinopathy   . Obesity   . HTN (hypertension) 08/20/2012  . Allergy   . Preventative health care 08/20/2012  . Dysmenorrhea 08/20/2012  . Other and unspecified hyperlipidemia 08/28/2013  . Obesity, unspecified 08/28/2013  . Sinusitis 11/14/2013    Past Surgical History  Procedure Laterality Date  . Refractive surgery  08-12-12    right, left on 09/16/12    Family History  Problem Relation Age of Onset  . Cancer Mother 61    breast  . Diabetes Mother     type 2  . Hypertension Mother   . Hypertension Father   . Hyperlipidemia Father   . COPD Father   . Hypertension Maternal Grandmother   . Hyperlipidemia Maternal Grandmother   . COPD Maternal Grandmother   . Asthma Maternal Grandmother   . Hypertension Maternal Grandfather   . Heart attack Maternal Grandfather   . Gout Brother   . Cancer Brother     lung  . Heart disease Paternal Grandfather     History   Social History  . Marital Status: Married    Spouse Name: N/A  . Number of Children: N/A  . Years of Education: N/A    Occupational History  . Not on file.   Social History Main Topics  . Smoking status: Never Smoker   . Smokeless tobacco: Never Used  . Alcohol Use: Yes     Comment: socially  . Drug Use: No  . Sexual Activity:    Partners: Male   Other Topics Concern  . Not on file   Social History Narrative    Current Outpatient Prescriptions on File Prior to Visit  Medication Sig Dispense Refill  . Blood Glucose Monitoring Suppl (ONE TOUCH BASIC SYSTEM) W/DEVICE KIT Test bid prn 1 each 0  . ibuprofen (ADVIL,MOTRIN) 200 MG tablet Take 400 mg by mouth every 8 (eight) hours as needed.    Marland Kitchen LORazepam (ATIVAN) 0.5 MG tablet Take 1 tablet (0.5 mg total) by mouth 2 (two) times daily as needed for anxiety or sleep. (Patient not taking: Reported on 05/18/2015) 40 tablet 2   No current facility-administered medications on file prior to visit.    No Known Allergies  Review of Systems  Review of Systems  Constitutional: Negative for fever, chills and malaise/fatigue.  HENT: Negative for congestion, hearing loss and nosebleeds.   Eyes: Negative for discharge.  Respiratory: Negative for cough, sputum production, shortness of breath and wheezing.   Cardiovascular: Negative for chest pain, palpitations and leg swelling.  Gastrointestinal: Negative for heartburn, nausea, vomiting, abdominal pain, diarrhea, constipation and blood in stool.  Genitourinary: Negative for dysuria, urgency, frequency and hematuria.  Musculoskeletal: Negative for myalgias, back pain and falls.  Skin: Negative for rash.  Neurological: Positive for dizziness. Negative for tremors, sensory change, focal weakness, loss of consciousness, weakness and headaches.  Endo/Heme/Allergies: Negative for polydipsia. Does not bruise/bleed easily.  Psychiatric/Behavioral: Negative for depression and suicidal ideas. The patient is not nervous/anxious and does not have insomnia.     Objective  BP 132/82 mmHg  Pulse 100  Temp(Src) 99.1  F (37.3 C) (Oral)  Ht 5' 8"  (1.727 m)  Wt 296 lb 8 oz (134.492 kg)  BMI 45.09 kg/m2  SpO2 97%  Physical Exam  Physical Exam  Constitutional: She is oriented to person, place, and time and well-developed, well-nourished, and in no distress. No distress.  HENT:  Head: Normocephalic and atraumatic.  Right Ear: External ear normal.  Left Ear: External ear normal.  Nose: Nose normal.  Mouth/Throat: Oropharynx is clear and moist. No oropharyngeal exudate.  Eyes: Conjunctivae are normal. Pupils are equal, round, and reactive to light. Right eye exhibits no discharge. Left eye exhibits no discharge. No scleral icterus.  Neck: Normal range of motion. Neck supple. No thyromegaly present.  Cardiovascular: Normal rate, regular rhythm, normal heart sounds and intact distal pulses.   No murmur heard. Pulmonary/Chest: Effort normal and breath sounds normal. No respiratory distress. She has no wheezes. She has no rales.  Abdominal: Soft. Bowel sounds are normal. She exhibits no distension and no mass. There is no tenderness.  Musculoskeletal: Normal range of motion. She exhibits no edema or tenderness.  Lymphadenopathy:    She has no cervical adenopathy.  Neurological: She is alert and oriented to person, place, and time. She has normal reflexes. No cranial nerve deficit. Coordination normal.  Skin: Skin is warm and dry. No rash noted. She is not diaphoretic.  Psychiatric: Mood, memory and affect normal.    Lab Results  Component Value Date   TSH 0.968 11/17/2014   Lab Results  Component Value Date   WBC 10.6* 11/17/2014   HGB 12.9 11/17/2014   HCT 38.3 11/17/2014   MCV 87.4 11/17/2014   PLT 349 11/17/2014   Lab Results  Component Value Date   CREATININE 0.70 11/17/2014   BUN 15 11/17/2014   NA 138 11/17/2014   K 5.0 11/17/2014   CL 101 11/17/2014   CO2 27 11/17/2014   Lab Results  Component Value Date   ALT 13 11/17/2014   AST 16 11/17/2014   ALKPHOS 71 11/17/2014   BILITOT  0.5 11/17/2014   Lab Results  Component Value Date   CHOL 113 11/17/2014   Lab Results  Component Value Date   HDL 24* 11/17/2014   Lab Results  Component Value Date   LDLCALC 46 11/17/2014   Lab Results  Component Value Date   TRIG 217* 11/17/2014   Lab Results  Component Value Date   CHOLHDL 4.7 11/17/2014     Assessment & Plan  HTN (hypertension) Well controlled, no changes to meds. Encouraged heart healthy diet such as the DASH diet and exercise as tolerated.    Diabetes mellitus type 2 in obese hgba1c acceptable, minimize simple carbs. Increase exercise as tolerated. Continue current meds    Obesity Encouraged DASH diet, decrease po intake and increase exercise as tolerated. Needs 7-8 hours of sleep nightly. Avoid trans fats, eat small, frequent meals every 4-5 hours with lean proteins,  complex carbs and healthy fats. Minimize simple carbs   Hyperlipemia Tolerating statin, encouraged heart healthy diet, avoid trans fats, minimize simple carbs and saturated fats. Increase exercise as tolerated   Preventative health care Patient encouraged to maintain heart healthy diet, regular exercise, adequate sleep. Consider daily probiotics. Take medications as prescribed. Labs ordered and reviewed.    the

## 2015-05-18 NOTE — Patient Instructions (Signed)
Consider Singulair and Nasacort if sinuses are worse 64 oz of clear fluids daily  Preventive Care for Adults A healthy lifestyle and preventive care can promote health and wellness. Preventive health guidelines for women include the following key practices.  A routine yearly physical is a good way to check with your health care provider about your health and preventive screening. It is a chance to share any concerns and updates on your health and to receive a thorough exam.  Visit your dentist for a routine exam and preventive care every 6 months. Brush your teeth twice a day and floss once a day. Good oral hygiene prevents tooth decay and gum disease.  The frequency of eye exams is based on your age, health, family medical history, use of contact lenses, and other factors. Follow your health care provider's recommendations for frequency of eye exams.  Eat a healthy diet. Foods like vegetables, fruits, whole grains, low-fat dairy products, and lean protein foods contain the nutrients you need without too many calories. Decrease your intake of foods high in solid fats, added sugars, and salt. Eat the right amount of calories for you.Get information about a proper diet from your health care provider, if necessary.  Regular physical exercise is one of the most important things you can do for your health. Most adults should get at least 150 minutes of moderate-intensity exercise (any activity that increases your heart rate and causes you to sweat) each week. In addition, most adults need muscle-strengthening exercises on 2 or more days a week.  Maintain a healthy weight. The body mass index (BMI) is a screening tool to identify possible weight problems. It provides an estimate of body fat based on height and weight. Your health care provider can find your BMI and can help you achieve or maintain a healthy weight.For adults 20 years and older:  A BMI below 18.5 is considered underweight.  A BMI of  18.5 to 24.9 is normal.  A BMI of 25 to 29.9 is considered overweight.  A BMI of 30 and above is considered obese.  Maintain normal blood lipids and cholesterol levels by exercising and minimizing your intake of saturated fat. Eat a balanced diet with plenty of fruit and vegetables. Blood tests for lipids and cholesterol should begin at age 20 and be repeated every 5 years. If your lipid or cholesterol levels are high, you are over 50, or you are at high risk for heart disease, you may need your cholesterol levels checked more frequently.Ongoing high lipid and cholesterol levels should be treated with medicines if diet and exercise are not working.  If you smoke, find out from your health care provider how to quit. If you do not use tobacco, do not start.  Lung cancer screening is recommended for adults aged 75-80 years who are at high risk for developing lung cancer because of a history of smoking. A yearly low-dose CT scan of the lungs is recommended for people who have at least a 30-pack-year history of smoking and are a current smoker or have quit within the past 15 years. A pack year of smoking is smoking an average of 1 pack of cigarettes a day for 1 year (for example: 1 pack a day for 30 years or 2 packs a day for 15 years). Yearly screening should continue until the smoker has stopped smoking for at least 15 years. Yearly screening should be stopped for people who develop a health problem that would prevent them from having lung  cancer treatment.  If you are pregnant, do not drink alcohol. If you are breastfeeding, be very cautious about drinking alcohol. If you are not pregnant and choose to drink alcohol, do not have more than 1 drink per day. One drink is considered to be 12 ounces (355 mL) of beer, 5 ounces (148 mL) of wine, or 1.5 ounces (44 mL) of liquor.  Avoid use of street drugs. Do not share needles with anyone. Ask for help if you need support or instructions about stopping the use  of drugs.  High blood pressure causes heart disease and increases the risk of stroke. Your blood pressure should be checked at least every 1 to 2 years. Ongoing high blood pressure should be treated with medicines if weight loss and exercise do not work.  If you are 67-23 years old, ask your health care provider if you should take aspirin to prevent strokes.  Diabetes screening involves taking a blood sample to check your fasting blood sugar level. This should be done once every 3 years, after age 43, if you are within normal weight and without risk factors for diabetes. Testing should be considered at a younger age or be carried out more frequently if you are overweight and have at least 1 risk factor for diabetes.  Breast cancer screening is essential preventive care for women. You should practice "breast self-awareness." This means understanding the normal appearance and feel of your breasts and may include breast self-examination. Any changes detected, no matter how small, should be reported to a health care provider. Women in their 52s and 30s should have a clinical breast exam (CBE) by a health care provider as part of a regular health exam every 1 to 3 years. After age 66, women should have a CBE every year. Starting at age 42, women should consider having a mammogram (breast X-ray test) every year. Women who have a family history of breast cancer should talk to their health care provider about genetic screening. Women at a high risk of breast cancer should talk to their health care providers about having an MRI and a mammogram every year.  Breast cancer gene (BRCA)-related cancer risk assessment is recommended for women who have family members with BRCA-related cancers. BRCA-related cancers include breast, ovarian, tubal, and peritoneal cancers. Having family members with these cancers may be associated with an increased risk for harmful changes (mutations) in the breast cancer genes BRCA1 and  BRCA2. Results of the assessment will determine the need for genetic counseling and BRCA1 and BRCA2 testing.  Routine pelvic exams to screen for cancer are no longer recommended for nonpregnant women who are considered low risk for cancer of the pelvic organs (ovaries, uterus, and vagina) and who do not have symptoms. Ask your health care provider if a screening pelvic exam is right for you.  If you have had past treatment for cervical cancer or a condition that could lead to cancer, you need Pap tests and screening for cancer for at least 20 years after your treatment. If Pap tests have been discontinued, your risk factors (such as having a new sexual partner) need to be reassessed to determine if screening should be resumed. Some women have medical problems that increase the chance of getting cervical cancer. In these cases, your health care provider may recommend more frequent screening and Pap tests.  The HPV test is an additional test that may be used for cervical cancer screening. The HPV test looks for the virus that can cause  the cell changes on the cervix. The cells collected during the Pap test can be tested for HPV. The HPV test could be used to screen women aged 36 years and older, and should be used in women of any age who have unclear Pap test results. After the age of 20, women should have HPV testing at the same frequency as a Pap test.  Colorectal cancer can be detected and often prevented. Most routine colorectal cancer screening begins at the age of 80 years and continues through age 30 years. However, your health care provider may recommend screening at an earlier age if you have risk factors for colon cancer. On a yearly basis, your health care provider may provide home test kits to check for hidden blood in the stool. Use of a small camera at the end of a tube, to directly examine the colon (sigmoidoscopy or colonoscopy), can detect the earliest forms of colorectal cancer. Talk to your  health care provider about this at age 84, when routine screening begins. Direct exam of the colon should be repeated every 5-10 years through age 51 years, unless early forms of pre-cancerous polyps or small growths are found.  People who are at an increased risk for hepatitis B should be screened for this virus. You are considered at high risk for hepatitis B if:  You were born in a country where hepatitis B occurs often. Talk with your health care provider about which countries are considered high risk.  Your parents were born in a high-risk country and you have not received a shot to protect against hepatitis B (hepatitis B vaccine).  You have HIV or AIDS.  You use needles to inject street drugs.  You live with, or have sex with, someone who has hepatitis B.  You get hemodialysis treatment.  You take certain medicines for conditions like cancer, organ transplantation, and autoimmune conditions.  Hepatitis C blood testing is recommended for all people born from [birth year] through 1965 and any individual with known risks for hepatitis C.  Practice safe sex. Use condoms and avoid high-risk sexual practices to reduce the spread of sexually transmitted infections (STIs). STIs include gonorrhea, chlamydia, syphilis, trichomonas, herpes, HPV, and human immunodeficiency virus (HIV). Herpes, HIV, and HPV are viral illnesses that have no cure. They can result in disability, cancer, and death.  You should be screened for sexually transmitted illnesses (STIs) including gonorrhea and chlamydia if:  You are sexually active and are younger than 24 years.  You are older than 24 years and your health care provider tells you that you are at risk for this type of infection.  Your sexual activity has changed since you were last screened and you are at an increased risk for chlamydia or gonorrhea. Ask your health care provider if you are at risk.  If you are at risk of being infected with HIV, it is  recommended that you take a prescription medicine daily to prevent HIV infection. This is called preexposure prophylaxis (PrEP). You are considered at risk if:  You are a heterosexual woman, are sexually active, and are at increased risk for HIV infection.  You take drugs by injection.  You are sexually active with a partner who has HIV.  Talk with your health care provider about whether you are at high risk of being infected with HIV. If you choose to begin PrEP, you should first be tested for HIV. You should then be tested every 3 months for as long as you are  taking PrEP.  Osteoporosis is a disease in which the bones lose minerals and strength with aging. This can result in serious bone fractures or breaks. The risk of osteoporosis can be identified using a bone density scan. Women ages 52 years and over and women at risk for fractures or osteoporosis should discuss screening with their health care providers. Ask your health care provider whether you should take a calcium supplement or vitamin D to reduce the rate of osteoporosis.  Menopause can be associated with physical symptoms and risks. Hormone replacement therapy is available to decrease symptoms and risks. You should talk to your health care provider about whether hormone replacement therapy is right for you.  Use sunscreen. Apply sunscreen liberally and repeatedly throughout the day. You should seek shade when your shadow is shorter than you. Protect yourself by wearing long sleeves, pants, a wide-brimmed hat, and sunglasses year round, whenever you are outdoors.  Once a month, do a whole body skin exam, using a mirror to look at the skin on your back. Tell your health care provider of new moles, moles that have irregular borders, moles that are larger than a pencil eraser, or moles that have changed in shape or color.  Stay current with required vaccines (immunizations).  Influenza vaccine. All adults should be immunized every  year.  Tetanus, diphtheria, and acellular pertussis (Td, Tdap) vaccine. Pregnant women should receive 1 dose of Tdap vaccine during each pregnancy. The dose should be obtained regardless of the length of time since the last dose. Immunization is preferred during the 27th-36th week of gestation. An adult who has not previously received Tdap or who does not know her vaccine status should receive 1 dose of Tdap. This initial dose should be followed by tetanus and diphtheria toxoids (Td) booster doses every 10 years. Adults with an unknown or incomplete history of completing a 3-dose immunization series with Td-containing vaccines should begin or complete a primary immunization series including a Tdap dose. Adults should receive a Td booster every 10 years.  Varicella vaccine. An adult without evidence of immunity to varicella should receive 2 doses or a second dose if she has previously received 1 dose. Pregnant females who do not have evidence of immunity should receive the first dose after pregnancy. This first dose should be obtained before leaving the health care facility. The second dose should be obtained 4-8 weeks after the first dose.  Human papillomavirus (HPV) vaccine. Females aged 13-26 years who have not received the vaccine previously should obtain the 3-dose series. The vaccine is not recommended for use in pregnant females. However, pregnancy testing is not needed before receiving a dose. If a female is found to be pregnant after receiving a dose, no treatment is needed. In that case, the remaining doses should be delayed until after the pregnancy. Immunization is recommended for any person with an immunocompromised condition through the age of 88 years if she did not get any or all doses earlier. During the 3-dose series, the second dose should be obtained 4-8 weeks after the first dose. The third dose should be obtained 24 weeks after the first dose and 16 weeks after the second dose.  Zoster  vaccine. One dose is recommended for adults aged 15 years or older unless certain conditions are present.  Measles, mumps, and rubella (MMR) vaccine. Adults born before [birth year] generally are considered immune to measles and mumps. Adults born in [birth year] or later should have 1 or more doses of MMR vaccine unless  there is a contraindication to the vaccine or there is laboratory evidence of immunity to each of the three diseases. A routine second dose of MMR vaccine should be obtained at least 28 days after the first dose for students attending postsecondary schools, health care workers, or international travelers. People who received inactivated measles vaccine or an unknown type of measles vaccine during 1963-1967 should receive 2 doses of MMR vaccine. People who received inactivated mumps vaccine or an unknown type of mumps vaccine before 1979 and are at high risk for mumps infection should consider immunization with 2 doses of MMR vaccine. For females of childbearing age, rubella immunity should be determined. If there is no evidence of immunity, females who are not pregnant should be vaccinated. If there is no evidence of immunity, females who are pregnant should delay immunization until after pregnancy. Unvaccinated health care workers born before [birth year] who lack laboratory evidence of measles, mumps, or rubella immunity or laboratory confirmation of disease should consider measles and mumps immunization with 2 doses of MMR vaccine or rubella immunization with 1 dose of MMR vaccine.  Pneumococcal 13-valent conjugate (PCV13) vaccine. When indicated, a person who is uncertain of her immunization history and has no record of immunization should receive the PCV13 vaccine. An adult aged 70 years or older who has certain medical conditions and has not been previously immunized should receive 1 dose of PCV13 vaccine. This PCV13 should be followed with a dose of pneumococcal polysaccharide (PPSV23) vaccine. The PPSV23  vaccine dose should be obtained at least 8 weeks after the dose of PCV13 vaccine. An adult aged 62 years or older who has certain medical conditions and previously received 1 or more doses of PPSV23 vaccine should receive 1 dose of PCV13. The PCV13 vaccine dose should be obtained 1 or more years after the last PPSV23 vaccine dose.  Pneumococcal polysaccharide (PPSV23) vaccine. When PCV13 is also indicated, PCV13 should be obtained first. All adults aged 32 years and older should be immunized. An adult younger than age 1 years who has certain medical conditions should be immunized. Any person who resides in a nursing home or long-term care facility should be immunized. An adult smoker should be immunized. People with an immunocompromised condition and certain other conditions should receive both PCV13 and PPSV23 vaccines. People with human immunodeficiency virus (HIV) infection should be immunized as soon as possible after diagnosis. Immunization during chemotherapy or radiation therapy should be avoided. Routine use of PPSV23 vaccine is not recommended for American Indians, [place] Natives, or people younger than 65 years unless there are medical conditions that require PPSV23 vaccine. When indicated, people who have unknown immunization and have no record of immunization should receive PPSV23 vaccine. One-time revaccination 5 years after the first dose of PPSV23 is recommended for people aged 19-64 years who have chronic kidney failure, nephrotic syndrome, asplenia, or immunocompromised conditions. People who received 1-2 doses of PPSV23 before age 42 years should receive another dose of PPSV23 vaccine at age 36 years or later if at least 5 years have passed since the previous dose. Doses of PPSV23 are not needed for people immunized with PPSV23 at or after age 36 years.  Meningococcal vaccine. Adults with asplenia or persistent complement component deficiencies should receive 2 doses of quadrivalent  meningococcal conjugate (MenACWY-D) vaccine. The doses should be obtained at least 2 months apart. Microbiologists working with certain meningococcal bacteria, [employer] recruits, people at risk during an outbreak, and people who travel to or live in countries with  a high rate of meningitis should be immunized. A first-year college student up through age 37 years who is living in a residence hall should receive a dose if she did not receive a dose on or after her 16th birthday. Adults who have certain high-risk conditions should receive one or more doses of vaccine.  Hepatitis A vaccine. Adults who wish to be protected from this disease, have certain high-risk conditions, work with hepatitis A-infected animals, work in hepatitis A research labs, or travel to or work in countries with a high rate of hepatitis A should be immunized. Adults who were previously unvaccinated and who anticipate close contact with an international adoptee during the first 60 days after arrival in the [place] from a country with a high rate of hepatitis A should be immunized.  Hepatitis B vaccine. Adults who wish to be protected from this disease, have certain high-risk conditions, may be exposed to blood or other infectious body fluids, are household contacts or sex partners of hepatitis B positive people, are clients or workers in certain care facilities, or travel to or work in countries with a high rate of hepatitis B should be immunized.  Haemophilus influenzae type b (Hib) vaccine. A previously unvaccinated person with asplenia or sickle cell disease or having a scheduled splenectomy should receive 1 dose of Hib vaccine. Regardless of previous immunization, a recipient of a hematopoietic stem cell transplant should receive a 3-dose series 6-12 months after her successful transplant. Hib vaccine is not recommended for adults with HIV infection. Preventive Services / Frequency Ages 79 to 78 years  Blood pressure check.**  / Every 1 to 2 years.  Lipid and cholesterol check.** / Every 5 years beginning at age 41.  Clinical breast exam.** / Every 3 years for women in their 61s and 63s.  BRCA-related cancer risk assessment.** / For women who have family members with a BRCA-related cancer (breast, ovarian, tubal, or peritoneal cancers).  Pap test.** / Every 2 years from ages 49 through 35. Every 3 years starting at age 5 through age 52 or 83 with a history of 3 consecutive normal Pap tests.  HPV screening.** / Every 3 years from ages 10 through ages 12 to 42 with a history of 3 consecutive normal Pap tests.  Hepatitis C blood test.** / For any individual with known risks for hepatitis C.  Skin self-exam. / Monthly.  Influenza vaccine. / Every year.  Tetanus, diphtheria, and acellular pertussis (Tdap, Td) vaccine.** / Consult your health care provider. Pregnant women should receive 1 dose of Tdap vaccine during each pregnancy. 1 dose of Td every 10 years.  Varicella vaccine.** / Consult your health care provider. Pregnant females who do not have evidence of immunity should receive the first dose after pregnancy.  HPV vaccine. / 3 doses over 6 months, if 67 and younger. The vaccine is not recommended for use in pregnant females. However, pregnancy testing is not needed before receiving a dose.  Measles, mumps, rubella (MMR) vaccine.** / You need at least 1 dose of MMR if you were born in 1957 or later. You may also need a 2nd dose. For females of childbearing age, rubella immunity should be determined. If there is no evidence of immunity, females who are not pregnant should be vaccinated. If there is no evidence of immunity, females who are pregnant should delay immunization until after pregnancy.  Pneumococcal 13-valent conjugate (PCV13) vaccine.** / Consult your health care provider.  Pneumococcal polysaccharide (PPSV23) vaccine.** / 1 to  2 doses if you smoke cigarettes or if you have certain  conditions.  Meningococcal vaccine.** / 1 dose if you are age 57 to 72 years and a Market researcher living in a residence hall, or have one of several medical conditions, you need to get vaccinated against meningococcal disease. You may also need additional booster doses.  Hepatitis A vaccine.** / Consult your health care provider.  Hepatitis B vaccine.** / Consult your health care provider.  Haemophilus influenzae type b (Hib) vaccine.** / Consult your health care provider. Ages 25 to 80 years  Blood pressure check.** / Every 1 to 2 years.  Lipid and cholesterol check.** / Every 5 years beginning at age 91 years.  Lung cancer screening. / Every year if you are aged 24-80 years and have a 30-pack-year history of smoking and currently smoke or have quit within the past 15 years. Yearly screening is stopped once you have quit smoking for at least 15 years or develop a health problem that would prevent you from having lung cancer treatment.  Clinical breast exam.** / Every year after age 44 years.  BRCA-related cancer risk assessment.** / For women who have family members with a BRCA-related cancer (breast, ovarian, tubal, or peritoneal cancers).  Mammogram.** / Every year beginning at age 64 years and continuing for as long as you are in good health. Consult with your health care provider.  Pap test.** / Every 3 years starting at age 71 years through age 81 or 55 years with a history of 3 consecutive normal Pap tests.  HPV screening.** / Every 3 years from ages 49 years through ages 83 to 52 years with a history of 3 consecutive normal Pap tests.  Fecal occult blood test (FOBT) of stool. / Every year beginning at age 82 years and continuing until age 88 years. You may not need to do this test if you get a colonoscopy every 10 years.  Flexible sigmoidoscopy or colonoscopy.** / Every 5 years for a flexible sigmoidoscopy or every 10 years for a colonoscopy beginning at age 85 years  and continuing until age 63 years.  Hepatitis C blood test.** / For all people born from [birth year] through 1966 and any individual with known risks for hepatitis C.  Skin self-exam. / Monthly.  Influenza vaccine. / Every year.  Tetanus, diphtheria, and acellular pertussis (Tdap/Td) vaccine.** / Consult your health care provider. Pregnant women should receive 1 dose of Tdap vaccine during each pregnancy. 1 dose of Td every 10 years.  Varicella vaccine.** / Consult your health care provider. Pregnant females who do not have evidence of immunity should receive the first dose after pregnancy.  Zoster vaccine.** / 1 dose for adults aged 59 years or older.  Measles, mumps, rubella (MMR) vaccine.** / You need at least 1 dose of MMR if you were born in 1957 or later. You may also need a 2nd dose. For females of childbearing age, rubella immunity should be determined. If there is no evidence of immunity, females who are not pregnant should be vaccinated. If there is no evidence of immunity, females who are pregnant should delay immunization until after pregnancy.  Pneumococcal 13-valent conjugate (PCV13) vaccine.** / Consult your health care provider.  Pneumococcal polysaccharide (PPSV23) vaccine.** / 1 to 2 doses if you smoke cigarettes or if you have certain conditions.  Meningococcal vaccine.** / Consult your health care provider.  Hepatitis A vaccine.** / Consult your health care provider.  Hepatitis B vaccine.** / Consult your health care  provider.  Haemophilus influenzae type b (Hib) vaccine.** / Consult your health care provider. Ages 80 years and over  Blood pressure check.** / Every 1 to 2 years.  Lipid and cholesterol check.** / Every 5 years beginning at age 26 years.  Lung cancer screening. / Every year if you are aged 68-80 years and have a 30-pack-year history of smoking and currently smoke or have quit within the past 15 years. Yearly screening is stopped once you have quit smoking  for at least 15 years or develop a health problem that would prevent you from having lung cancer treatment.  Clinical breast exam.** / Every year after age 42 years.  BRCA-related cancer risk assessment.** / For women who have family members with a BRCA-related cancer (breast, ovarian, tubal, or peritoneal cancers).  Mammogram.** / Every year beginning at age 69 years and continuing for as long as you are in good health. Consult with your health care provider.  Pap test.** / Every 3 years starting at age 21 years through age 70 or 33 years with 3 consecutive normal Pap tests. Testing can be stopped between 65 and 70 years with 3 consecutive normal Pap tests and no abnormal Pap or HPV tests in the past 10 years.  HPV screening.** / Every 3 years from ages 46 years through ages 30 or 38 years with a history of 3 consecutive normal Pap tests. Testing can be stopped between 65 and 70 years with 3 consecutive normal Pap tests and no abnormal Pap or HPV tests in the past 10 years.  Fecal occult blood test (FOBT) of stool. / Every year beginning at age 29 years and continuing until age 85 years. You may not need to do this test if you get a colonoscopy every 10 years.  Flexible sigmoidoscopy or colonoscopy.** / Every 5 years for a flexible sigmoidoscopy or every 10 years for a colonoscopy beginning at age 26 years and continuing until age 21 years.  Hepatitis C blood test.** / For all people born from [birth year] through 1966 and any individual with known risks for hepatitis C.  Osteoporosis screening.** / A one-time screening for women ages 34 years and over and women at risk for fractures or osteoporosis.  Skin self-exam. / Monthly.  Influenza vaccine. / Every year.  Tetanus, diphtheria, and acellular pertussis (Tdap/Td) vaccine.** / 1 dose of Td every 10 years.  Varicella vaccine.** / Consult your health care provider.  Zoster vaccine.** / 1 dose for adults aged 45 years or older.  Pneumococcal  13-valent conjugate (PCV13) vaccine.** / Consult your health care provider.  Pneumococcal polysaccharide (PPSV23) vaccine.** / 1 dose for all adults aged 68 years and older.  Meningococcal vaccine.** / Consult your health care provider.  Hepatitis A vaccine.** / Consult your health care provider.  Hepatitis B vaccine.** / Consult your health care provider.  Haemophilus influenzae type b (Hib) vaccine.** / Consult your health care provider. ** Family history and personal history of risk and conditions may change your health care provider's recommendations. Document Released: 02/10/2002 Document Revised: 05/01/2014 Document Reviewed: 05/12/2011 [facility] Patient Information 2015 [place], [place]. This information is not intended to replace advice given to you by your health care provider. Make sure you discuss any questions you have with your health care provider.

## 2015-05-19 ENCOUNTER — Other Ambulatory Visit: Payer: Self-pay | Admitting: Family Medicine

## 2015-05-19 LAB — CBC
HCT: 37.7 % (ref 36.0–46.0)
Hemoglobin: 12.6 g/dL (ref 12.0–15.0)
MCH: 29.8 pg (ref 26.0–34.0)
MCHC: 33.4 g/dL (ref 30.0–36.0)
MCV: 89.1 fL (ref 78.0–100.0)
MPV: 9.7 fL (ref 8.6–12.4)
Platelets: 340 10*3/uL (ref 150–400)
RBC: 4.23 MIL/uL (ref 3.87–5.11)
RDW: 13.3 % (ref 11.5–15.5)
WBC: 8.7 10*3/uL (ref 4.0–10.5)

## 2015-05-19 LAB — COMPREHENSIVE METABOLIC PANEL
ALT: 16 U/L (ref 0–35)
AST: 16 U/L (ref 0–37)
Albumin: 3.9 g/dL (ref 3.5–5.2)
Alkaline Phosphatase: 78 U/L (ref 39–117)
BUN: 18 mg/dL (ref 6–23)
CO2: 26 meq/L (ref 19–32)
CREATININE: 0.81 mg/dL (ref 0.50–1.10)
Calcium: 8.7 mg/dL (ref 8.4–10.5)
Chloride: 100 mEq/L (ref 96–112)
Glucose, Bld: 111 mg/dL — ABNORMAL HIGH (ref 70–99)
Potassium: 4.2 mEq/L (ref 3.5–5.3)
Sodium: 137 mEq/L (ref 135–145)
TOTAL PROTEIN: 6.7 g/dL (ref 6.0–8.3)
Total Bilirubin: 0.7 mg/dL (ref 0.2–1.2)

## 2015-05-19 LAB — LIPID PANEL
Cholesterol: 112 mg/dL (ref 0–200)
HDL: 21 mg/dL — AB (ref >=46)
LDL Cholesterol: 46 mg/dL (ref 0–99)
Total CHOL/HDL Ratio: 5.3 Ratio
Triglycerides: 225 mg/dL — ABNORMAL HIGH (ref <150)
VLDL: 45 mg/dL — AB (ref 0–40)

## 2015-05-19 LAB — TSH: TSH: 0.807 u[IU]/mL (ref 0.350–4.500)

## 2015-05-19 LAB — HEMOGLOBIN A1C
Hgb A1c MFr Bld: 7.4 % — ABNORMAL HIGH (ref <5.7)
MEAN PLASMA GLUCOSE: 166 mg/dL — AB (ref <117)

## 2015-05-21 ENCOUNTER — Other Ambulatory Visit: Payer: Self-pay | Admitting: Family Medicine

## 2015-05-21 ENCOUNTER — Telehealth: Payer: Self-pay | Admitting: Family Medicine

## 2015-05-21 MED ORDER — METFORMIN HCL 500 MG PO TABS: 500.0000 mg | ORAL_TABLET | Freq: Two times a day (BID) | ORAL | Status: DC

## 2015-05-21 NOTE — Telephone Encounter (Signed)
Relation to pt: SELF  Call back number: [phone]   Reason for call:  Pt returning your call regarding lab results

## 2015-05-27 NOTE — Assessment & Plan Note (Signed)
Well controlled, no changes to meds. Encouraged heart healthy diet such as the DASH diet and exercise as tolerated.  °

## 2015-05-27 NOTE — Assessment & Plan Note (Signed)
Patient encouraged to maintain heart healthy diet, regular exercise, adequate sleep. Consider daily probiotics. Take medications as prescribed. Labs ordered and reviewed 

## 2015-05-27 NOTE — Assessment & Plan Note (Signed)
hgba1c acceptable, minimize simple carbs. Increase exercise as tolerated. Continue current meds 

## 2015-05-27 NOTE — Assessment & Plan Note (Signed)
Encouraged DASH diet, decrease po intake and increase exercise as tolerated. Needs 7-8 hours of sleep nightly. Avoid trans fats, eat small, frequent meals every 4-5 hours with lean proteins, complex carbs and healthy fats. Minimize simple carbs 

## 2015-05-27 NOTE — Assessment & Plan Note (Signed)
Tolerating statin, encouraged heart healthy diet, avoid trans fats, minimize simple carbs and saturated fats. Increase exercise as tolerated 

## 2015-06-05 ENCOUNTER — Other Ambulatory Visit: Payer: Self-pay | Admitting: Family Medicine

## 2015-07-02 ENCOUNTER — Other Ambulatory Visit: Payer: Self-pay | Admitting: Family Medicine

## 2015-08-22 ENCOUNTER — Other Ambulatory Visit: Payer: Self-pay | Admitting: Family Medicine

## 2015-08-23 ENCOUNTER — Other Ambulatory Visit: Payer: Self-pay | Admitting: Family Medicine

## 2015-09-10 ENCOUNTER — Ambulatory Visit: Payer: BLUE CROSS/BLUE SHIELD | Admitting: Family Medicine

## 2015-11-19 ENCOUNTER — Encounter: Payer: Self-pay | Admitting: Family Medicine

## 2015-11-19 ENCOUNTER — Ambulatory Visit: Payer: BLUE CROSS/BLUE SHIELD | Admitting: Family Medicine

## 2015-11-21 ENCOUNTER — Other Ambulatory Visit: Payer: Self-pay | Admitting: Family Medicine

## 2015-11-21 NOTE — Telephone Encounter (Signed)
OK to refill Lorazepam with same number and sig, no extra refills

## 2015-11-26 NOTE — Telephone Encounter (Signed)
[facility]

## 2015-11-26 NOTE — Telephone Encounter (Signed)
Printed and on counter for signature. 

## 2015-12-24 ENCOUNTER — Other Ambulatory Visit: Payer: Self-pay | Admitting: Family Medicine

## 2015-12-25 NOTE — Telephone Encounter (Signed)
Requesting: Lorazepam Contract  None UDS    None Last OV   05/18/2015 Last Refill   #40 with 0 refills on 11/26/2015  Please Advise

## 2015-12-25 NOTE — Telephone Encounter (Signed)
[facility] [facility] [place]

## 2016-01-09 ENCOUNTER — Other Ambulatory Visit: Payer: Self-pay | Admitting: Family Medicine

## 2016-01-29 ENCOUNTER — Ambulatory Visit (INDEPENDENT_AMBULATORY_CARE_PROVIDER_SITE_OTHER): Payer: BLUE CROSS/BLUE SHIELD | Admitting: Family Medicine

## 2016-01-29 ENCOUNTER — Ambulatory Visit (HOSPITAL_BASED_OUTPATIENT_CLINIC_OR_DEPARTMENT_OTHER)
Admission: RE | Admit: 2016-01-29 | Discharge: 2016-01-29 | Disposition: A | Discharge status: Home / Self Care | Payer: BLUE CROSS/BLUE SHIELD | Source: Ambulatory Visit | Attending: Family Medicine | Admitting: Family Medicine

## 2016-01-29 ENCOUNTER — Encounter: Payer: Self-pay | Admitting: Family Medicine

## 2016-01-29 VITALS — BP 158/92 | HR 96 | Temp 98.6°F | Ht 68.0 in | Wt 296.1 lb

## 2016-01-29 DIAGNOSIS — E669 Obesity, unspecified: Secondary | ICD-10-CM

## 2016-01-29 DIAGNOSIS — E1169 Type 2 diabetes mellitus with other specified complication: Secondary | ICD-10-CM

## 2016-01-29 DIAGNOSIS — Z1239 Encounter for other screening for malignant neoplasm of breast: Secondary | ICD-10-CM

## 2016-01-29 DIAGNOSIS — E119 Type 2 diabetes mellitus without complications: Secondary | ICD-10-CM

## 2016-01-29 DIAGNOSIS — L03032 Cellulitis of left toe: Secondary | ICD-10-CM | POA: Diagnosis not present

## 2016-01-29 DIAGNOSIS — Z1231 Encounter for screening mammogram for malignant neoplasm of breast: Secondary | ICD-10-CM | POA: Diagnosis present

## 2016-01-29 DIAGNOSIS — E785 Hyperlipidemia, unspecified: Secondary | ICD-10-CM | POA: Diagnosis not present

## 2016-01-29 DIAGNOSIS — I10 Essential (primary) hypertension: Secondary | ICD-10-CM

## 2016-01-29 LAB — LIPID PANEL
CHOL/HDL RATIO: 5
CHOLESTEROL: 108 mg/dL (ref 0–200)
HDL: 23.6 mg/dL — ABNORMAL LOW (ref >39.00)
NONHDL: 84.03
TRIGLYCERIDES: 227 mg/dL — AB (ref 0.0–149.0)
VLDL: 45.4 mg/dL — AB (ref 0.0–40.0)

## 2016-01-29 LAB — COMPREHENSIVE METABOLIC PANEL
ALT: 18 U/L (ref 0–35)
AST: 20 U/L (ref 0–37)
Albumin: 4.1 g/dL (ref 3.5–5.2)
Alkaline Phosphatase: 74 U/L (ref 39–117)
BILIRUBIN TOTAL: 0.7 mg/dL (ref 0.2–1.2)
BUN: 13 mg/dL (ref 6–23)
CALCIUM: 9.8 mg/dL (ref 8.4–10.5)
CHLORIDE: 100 meq/L (ref 96–112)
CO2: 27 meq/L (ref 19–32)
Creatinine, Ser: 0.83 mg/dL (ref 0.40–1.20)
GFR: 78.83 mL/min (ref >60.00)
GLUCOSE: 168 mg/dL — AB (ref 70–99)
Potassium: 4.2 mEq/L (ref 3.5–5.1)
Sodium: 137 mEq/L (ref 135–145)
Total Protein: 7.6 g/dL (ref 6.0–8.3)

## 2016-01-29 LAB — CBC
HEMATOCRIT: 41.2 % (ref 36.0–46.0)
HEMOGLOBIN: 13.6 g/dL (ref 12.0–15.0)
MCHC: 33 g/dL (ref 30.0–36.0)
MCV: 90.3 fl (ref 78.0–100.0)
PLATELETS: 397 10*3/uL (ref 150.0–400.0)
RBC: 4.56 Mil/uL (ref 3.87–5.11)
RDW: 12.6 % (ref 11.5–15.5)
WBC: 9.1 10*3/uL (ref 4.0–10.5)

## 2016-01-29 LAB — MICROALBUMIN / CREATININE URINE RATIO
Creatinine,U: 213.9 mg/dL
Microalb Creat Ratio: 9.2 mg/g (ref 0.0–30.0)
Microalb, Ur: 19.6 mg/dL — ABNORMAL HIGH (ref 0.0–1.9)

## 2016-01-29 LAB — HEMOGLOBIN A1C: Hgb A1c MFr Bld: 7.3 % — ABNORMAL HIGH (ref 4.6–6.5)

## 2016-01-29 LAB — LDL CHOLESTEROL, DIRECT: Direct LDL: 54 mg/dL

## 2016-01-29 LAB — TSH: TSH: 1.68 u[IU]/mL (ref 0.35–4.50)

## 2016-01-29 MED ORDER — ATORVASTATIN CALCIUM 20 MG PO TABS: 20.0000 mg | ORAL_TABLET | Freq: Every day | ORAL | Status: DC

## 2016-01-29 MED ORDER — METFORMIN HCL 500 MG PO TABS: 500.0000 mg | ORAL_TABLET | Freq: Every day | ORAL | Status: DC

## 2016-01-29 MED ORDER — FLUCONAZOLE 150 MG PO TABS: ORAL_TABLET | ORAL | Status: DC

## 2016-01-29 MED ORDER — CEPHALEXIN 500 MG PO CAPS: 500.0000 mg | ORAL_CAPSULE | Freq: Three times a day (TID) | ORAL | Status: DC

## 2016-01-29 MED ORDER — LISINOPRIL 20 MG PO TABS: ORAL_TABLET | ORAL | Status: DC

## 2016-01-29 MED ORDER — GABAPENTIN 100 MG PO CAPS: ORAL_CAPSULE | ORAL | Status: DC

## 2016-01-29 MED ORDER — GLIPIZIDE 5 MG PO TABS: ORAL_TABLET | ORAL | Status: DC

## 2016-01-29 MED ORDER — HYDROCHLOROTHIAZIDE 25 MG PO TABS: ORAL_TABLET | ORAL | Status: DC

## 2016-01-29 MED ORDER — METFORMIN HCL 1000 MG PO TABS: ORAL_TABLET | ORAL | Status: DC

## 2016-01-29 NOTE — Progress Notes (Signed)
Pre visit review using our clinic review tool, if applicable. No additional management support is needed unless otherwise documented below in the visit note. 

## 2016-01-29 NOTE — Patient Instructions (Signed)

## 2016-02-03 ENCOUNTER — Encounter: Payer: Self-pay | Admitting: Family Medicine

## 2016-02-03 DIAGNOSIS — L03032 Cellulitis of left toe: Secondary | ICD-10-CM | POA: Insufficient documentation

## 2016-02-03 DIAGNOSIS — Z1239 Encounter for other screening for malignant neoplasm of breast: Secondary | ICD-10-CM | POA: Insufficient documentation

## 2016-02-03 NOTE — Assessment & Plan Note (Signed)
Tolerating statin, encouraged heart healthy diet, avoid trans fats, minimize simple carbs and saturated fats. Increase exercise as tolerated 

## 2016-02-03 NOTE — Assessment & Plan Note (Signed)
Mgm ordered.

## 2016-02-03 NOTE — Assessment & Plan Note (Signed)
hgba1c acceptable, minimize simple carbs. Increase exercise as tolerated. Continue current meds 

## 2016-02-03 NOTE — Assessment & Plan Note (Signed)
Encouraged DASH diet, decrease po intake and increase exercise as tolerated. Needs 7-8 hours of sleep nightly. Avoid trans fats, eat small, frequent meals every 4-5 hours with lean proteins, complex carbs and healthy fats. Minimize simple carbs 

## 2016-02-03 NOTE — Assessment & Plan Note (Addendum)
Poorly controlled, add thiazide diuretics. Encouraged heart healthy diet such as the DASH diet and exercise as tolerated.

## 2016-02-03 NOTE — Progress Notes (Signed)
Patient ID: April [place], female   DOB: [birth year], 45 y.o.   MRN: [id number]   Subjective:    Patient ID: April [place], female    DOB: [birth year], 45 y.o.   MRN: [id number]  Chief Complaint  Patient presents with  . Follow-up    HPI Patient is in today for follow up. No recent illness, she is accompanied by her husband. She is complaining of a blister on her left great toe. No warmth or redness. No symptoms of systemic illness. No fevers, chills, malaise. Denies CP/palp/SOB/HA/congestion/fevers/GI or GU c/o. Taking meds as prescribed  Past Medical History  Diagnosis Date  . Diabetes mellitus 08-12-12    type 2- dr Zigmund Daniel diagnosed  . Chicken pox 45 yrs old  . Diabetic retinopathy ([place])   . Obesity   . HTN (hypertension) 08/20/2012  . Allergy   . Preventative health care 08/20/2012  . Dysmenorrhea 08/20/2012  . Other and unspecified hyperlipidemia 08/28/2013  . Obesity, unspecified 08/28/2013  . Sinusitis 11/14/2013    Past Surgical History  Procedure Laterality Date  . Refractive surgery  08-12-12    right, left on 09/16/12    Family History  Problem Relation Age of Onset  . Cancer Mother 68    breast  . Diabetes Mother     type 2  . Hypertension Mother   . Hypertension Father   . Hyperlipidemia Father   . COPD Father   . Hypertension Maternal Grandmother   . Hyperlipidemia Maternal Grandmother   . COPD Maternal Grandmother   . Asthma Maternal Grandmother   . Hypertension Maternal Grandfather   . Heart attack Maternal Grandfather   . Gout Brother   . Cancer Brother     lung  . Heart disease Paternal Grandfather     Social History   Social History  . Marital Status: Married    Spouse Name: N/A  . Number of Children: N/A  . Years of Education: N/A   Occupational History  . Not on file.   Social History Main Topics  . Smoking status: Never Smoker   . Smokeless tobacco: Never Used  . Alcohol Use: Yes     Comment: socially  . Drug Use: No  . Sexual  Activity:    Partners: Male   Other Topics Concern  . Not on file   Social History Narrative    Outpatient Prescriptions Prior to Visit  Medication Sig Dispense Refill  . atorvastatin (LIPITOR) 20 MG tablet Take 1 tablet (20 mg total) by mouth daily. 30 tablet 6  . Blood Glucose Monitoring Suppl (ONE TOUCH BASIC SYSTEM) W/DEVICE KIT Test bid prn 1 each 0  . glucose blood (ONE TOUCH ULTRA TEST) test strip TEST TWICE A DAY DX E11.9 100 each 1  . ibuprofen (ADVIL,MOTRIN) 200 MG tablet Take 400 mg by mouth every 8 (eight) hours as needed.    Marland Kitchen LORazepam (ATIVAN) 0.5 MG tablet TAKE 1 TABLET TWICE A DAY AS NEEDED FOR ANXIETY OR SLEEP 40 tablet 0  . ONE TOUCH LANCETS MISC Check two daily DX E11.9 200 each 12  . atorvastatin (LIPITOR) 20 MG tablet TAKE 1 TABLET BY MOUTH EVERY DAY 30 tablet 5  . glipiZIDE (GLUCOTROL) 5 MG tablet TAKE 1 TABLET (5 MG TOTAL) BY MOUTH 2 (TWO) TIMES DAILY BEFORE A MEAL. 60 tablet 6  . hydrochlorothiazide (HYDRODIURIL) 25 MG tablet TAKE 1 TABLET (25 MG TOTAL) BY MOUTH DAILY. 30 tablet 6  . hydrochlorothiazide (HYDRODIURIL) 25 MG tablet TAKE 1 TABLET (  25 MG TOTAL) BY MOUTH DAILY. 30 tablet 2  . lisinopril (PRINIVIL,ZESTRIL) 20 MG tablet TAKE 1 TABLET (20 MG TOTAL) BY MOUTH 2 (TWO) TIMES DAILY. 60 tablet 2  . lisinopril (PRINIVIL,ZESTRIL) 20 MG tablet TAKE 1 TABLET (20 MG TOTAL) BY MOUTH 2 (TWO) TIMES DAILY. 60 tablet 6  . metFORMIN (GLUCOPHAGE) 1000 MG tablet TAKE 1 TABLET (1,000 MG TOTAL) BY MOUTH 2 (TWO) TIMES DAILY WITH A MEAL. 180 tablet 0  . metFORMIN (GLUCOPHAGE) 500 MG tablet Take 1 tablet (500 mg total) by mouth 2 (two) times daily with a meal. 180 tablet 3  . metFORMIN (GLUCOPHAGE) 500 MG tablet TAKE 1 TABLET (500 MG TOTAL) BY MOUTH DAILY AT 12 NOON. 90 tablet 2   No facility-administered medications prior to visit.    No Known Allergies  Review of Systems  Constitutional: Negative for fever and malaise/fatigue.  HENT: Negative for congestion.   Eyes:  Negative for discharge.  Respiratory: Negative for shortness of breath.   Cardiovascular: Negative for chest pain, palpitations and leg swelling.  Gastrointestinal: Negative for nausea and abdominal pain.  Genitourinary: Negative for dysuria.  Musculoskeletal: Negative for falls.  Skin: Negative for rash.  Neurological: Negative for loss of consciousness and headaches.  Endo/Heme/Allergies: Negative for environmental allergies.  Psychiatric/Behavioral: Negative for depression. The patient is not nervous/anxious.        Objective:    Physical Exam  Constitutional: She is oriented to person, place, and time. She appears well-developed and well-nourished. No distress.  HENT:  Head: Normocephalic and atraumatic.  Nose: Nose normal.  Eyes: Right eye exhibits no discharge. Left eye exhibits no discharge.  Neck: Normal range of motion. Neck supple.  Cardiovascular: Normal rate and regular rhythm.   No murmur heard. Pulmonary/Chest: Effort normal and breath sounds normal.  Abdominal: Soft. Bowel sounds are normal. There is no tenderness.  Musculoskeletal: She exhibits no edema.  Neurological: She is alert and oriented to person, place, and time.  Skin: Skin is warm and dry.  Left great toe 1x3 cm blister along lateral aspect, filled with clear fluid, no surrounding fluctuance  Psychiatric: She has a normal mood and affect.  Nursing note and vitals reviewed.   BP 158/92 mmHg  Pulse 96  Temp(Src) 98.6 F (37 C) (Oral)  Ht 5' 8"  (1.727 m)  Wt 296 lb 2 oz (134.321 kg)  BMI 45.04 kg/m2  SpO2 96% Wt Readings from Last 3 Encounters:  01/29/16 296 lb 2 oz (134.321 kg)  05/18/15 296 lb 8 oz (134.492 kg)  11/17/14 294 lb (133.358 kg)     Lab Results  Component Value Date   WBC 9.1 01/29/2016   HGB 13.6 01/29/2016   HCT 41.2 01/29/2016   PLT 397.0 01/29/2016   GLUCOSE 168* 01/29/2016   CHOL 108 01/29/2016   TRIG 227.0* 01/29/2016   HDL 23.60* 01/29/2016   LDLDIRECT 54.0  01/29/2016   LDLCALC 46 05/18/2015   ALT 18 01/29/2016   AST 20 01/29/2016   NA 137 01/29/2016   K 4.2 01/29/2016   CL 100 01/29/2016   CREATININE 0.83 01/29/2016   BUN 13 01/29/2016   CO2 27 01/29/2016   TSH 1.68 01/29/2016   INR 1.03 03/04/2011   HGBA1C 7.3* 01/29/2016   MICROALBUR 19.6* 01/29/2016    Lab Results  Component Value Date   TSH 1.68 01/29/2016   Lab Results  Component Value Date   WBC 9.1 01/29/2016   HGB 13.6 01/29/2016   HCT 41.2 01/29/2016  MCV 90.3 01/29/2016   PLT 397.0 01/29/2016   Lab Results  Component Value Date   NA 137 01/29/2016   K 4.2 01/29/2016   CO2 27 01/29/2016   GLUCOSE 168* 01/29/2016   BUN 13 01/29/2016   CREATININE 0.83 01/29/2016   BILITOT 0.7 01/29/2016   ALKPHOS 74 01/29/2016   AST 20 01/29/2016   ALT 18 01/29/2016   PROT 7.6 01/29/2016   ALBUMIN 4.1 01/29/2016   CALCIUM 9.8 01/29/2016   GFR 78.83 01/29/2016   Lab Results  Component Value Date   CHOL 108 01/29/2016   Lab Results  Component Value Date   HDL 23.60* 01/29/2016   Lab Results  Component Value Date   LDLCALC 46 05/18/2015   Lab Results  Component Value Date   TRIG 227.0* 01/29/2016   Lab Results  Component Value Date   CHOLHDL 5 01/29/2016   Lab Results  Component Value Date   HGBA1C 7.3* 01/29/2016       Assessment & Plan:   Problem List Items Addressed This Visit    Breast cancer screening    Mgm ordered      Relevant Orders   MM Digital Screening (Completed)   Cellulitis of toe of left foot    Blister left great toe, no sign of infection she is encouraged to keep blister intact and protected. If sympomts of infection occur has a prescription of Keflex to use.       Diabetes mellitus type 2 in obese ([place]) - Primary    hgba1c acceptable, minimize simple carbs. Increase exercise as tolerated. Continue current meds      Relevant Medications   metFORMIN (GLUCOPHAGE) 500 MG tablet   metFORMIN (GLUCOPHAGE) 1000 MG tablet    lisinopril (PRINIVIL,ZESTRIL) 20 MG tablet   glipiZIDE (GLUCOTROL) 5 MG tablet   atorvastatin (LIPITOR) 20 MG tablet   Other Relevant Orders   Hemoglobin A1c (Completed)   Microalbumin / creatinine urine ratio (Completed)   HTN (hypertension)    Poorly controlled, add thiazide diuretics. Encouraged heart healthy diet such as the DASH diet and exercise as tolerated.       Relevant Medications   lisinopril (PRINIVIL,ZESTRIL) 20 MG tablet   hydrochlorothiazide (HYDRODIURIL) 25 MG tablet   atorvastatin (LIPITOR) 20 MG tablet   Other Relevant Orders   Comprehensive metabolic panel (Completed)   TSH (Completed)   CBC (Completed)   Hyperlipemia    Tolerating statin, encouraged heart healthy diet, avoid trans fats, minimize simple carbs and saturated fats. Increase exercise as tolerated      Relevant Medications   lisinopril (PRINIVIL,ZESTRIL) 20 MG tablet   hydrochlorothiazide (HYDRODIURIL) 25 MG tablet   atorvastatin (LIPITOR) 20 MG tablet   Other Relevant Orders   Lipid panel (Completed)   Obesity    Encouraged DASH diet, decrease po intake and increase exercise as tolerated. Needs 7-8 hours of sleep nightly. Avoid trans fats, eat small, frequent meals every 4-5 hours with lean proteins, complex carbs and healthy fats. Minimize simple carbs      Relevant Medications   metFORMIN (GLUCOPHAGE) 500 MG tablet   metFORMIN (GLUCOPHAGE) 1000 MG tablet   glipiZIDE (GLUCOTROL) 5 MG tablet      I have changed Ms. Bullinger's metFORMIN and atorvastatin. I am also having her start on cephALEXin, fluconazole, and gabapentin. Additionally, I am having her maintain her ibuprofen, ONE TOUCH BASIC SYSTEM, atorvastatin, ONE TOUCH LANCETS, glucose blood, LORazepam, metFORMIN, lisinopril, hydrochlorothiazide, and glipiZIDE.  Meds ordered this encounter  Medications  .  metFORMIN (GLUCOPHAGE) 500 MG tablet    Sig: Take 1 tablet (500 mg total) by mouth daily.    Dispense:  90 tablet    Refill:  1    D/C  PREVIOUS SCRIPTS FOR THIS MEDICATION  . metFORMIN (GLUCOPHAGE) 1000 MG tablet    Sig: TAKE 1 TABLET (1,000 MG TOTAL) BY MOUTH 2 (TWO) TIMES DAILY WITH A MEAL.    Dispense:  180 tablet    Refill:  1  . lisinopril (PRINIVIL,ZESTRIL) 20 MG tablet    Sig: TAKE 1 TABLET (20 MG TOTAL) BY MOUTH 2 (TWO) TIMES DAILY.    Dispense:  180 tablet    Refill:  1  . hydrochlorothiazide (HYDRODIURIL) 25 MG tablet    Sig: TAKE 1 TABLET (25 MG TOTAL) BY MOUTH DAILY.    Dispense:  90 tablet    Refill:  1  . glipiZIDE (GLUCOTROL) 5 MG tablet    Sig: TAKE 1 TABLET (5 MG TOTAL) BY MOUTH 2 (TWO) TIMES DAILY BEFORE A MEAL.    Dispense:  180 tablet    Refill:  1  . atorvastatin (LIPITOR) 20 MG tablet    Sig: Take 1 tablet (20 mg total) by mouth daily.    Dispense:  90 tablet    Refill:  1  . cephALEXin (KEFLEX) 500 MG capsule    Sig: Take 1 capsule (500 mg total) by mouth 3 (three) times daily.    Dispense:  30 capsule    Refill:  0  . fluconazole (DIFLUCAN) 150 MG tablet    Sig: 1 tab po weekly x 2 days    Dispense:  2 tablet    Refill:  1  . gabapentin (NEURONTIN) 100 MG capsule    Sig: 1-3 caps po qhs as directed    Dispense:  90 capsule    Refill:  1     Meshell Abdulaziz, MD

## 2016-02-03 NOTE — Assessment & Plan Note (Signed)
Blister left great toe, no sign of infection she is encouraged to keep blister intact and protected. If sympomts of infection occur has a prescription of Keflex to use.

## 2016-02-26 ENCOUNTER — Telehealth: Payer: Self-pay | Admitting: *Deleted

## 2016-02-26 NOTE — Telephone Encounter (Signed)
Received fax from [facility] pharmacy requesting refill on Cephalexin 500 [1] TID, originally px on 01/29/16 at OV with Dr. Rogelia Rohrer and pt was instructed to:  "If sympomts of infection occur has a prescription of Keflex to use."       On AVS instructions. LMOM with contact name and number for return call RE: to schedule appointment, informed patient that we do not routinely grant refills on ABXs, unless provider had placed a note that we would and that it has been a month since original Rx was granted to use , if needed; she needs appointment for reassessment and evaluation/SLS 02/28

## 2016-02-26 NOTE — Telephone Encounter (Signed)
Called patient to find out what is going on, as to why she need the keflex, husband got on the phone states they were just seen in office and she has a  Blister on her toe and the blister has popped and now she feels some numbness and pain and she has diabeitic neuropathy, patient asked should they be seen or go to urgent care or hospital, spoke to [place], [place] and he agreed that she did need to go to the hospital to be seen to further asess the issue.

## 2016-02-26 NOTE — Telephone Encounter (Signed)
Please check with patient and see why she wants a refill on the Keflex, did it help temporarily and now that it is gone is it getting worse again? If she had no initial response then she should come in if she had a partial response am willing to retreat.

## 2016-03-18 ENCOUNTER — Ambulatory Visit (INDEPENDENT_AMBULATORY_CARE_PROVIDER_SITE_OTHER): Payer: BLUE CROSS/BLUE SHIELD | Admitting: Ophthalmology

## 2016-03-18 DIAGNOSIS — H43813 Vitreous degeneration, bilateral: Secondary | ICD-10-CM | POA: Diagnosis not present

## 2016-03-18 DIAGNOSIS — E113393 Type 2 diabetes mellitus with moderate nonproliferative diabetic retinopathy without macular edema, bilateral: Secondary | ICD-10-CM

## 2016-03-18 DIAGNOSIS — H2513 Age-related nuclear cataract, bilateral: Secondary | ICD-10-CM

## 2016-03-18 DIAGNOSIS — I10 Essential (primary) hypertension: Secondary | ICD-10-CM

## 2016-03-18 DIAGNOSIS — E11319 Type 2 diabetes mellitus with unspecified diabetic retinopathy without macular edema: Secondary | ICD-10-CM

## 2016-03-18 DIAGNOSIS — H35033 Hypertensive retinopathy, bilateral: Secondary | ICD-10-CM

## 2016-04-12 ENCOUNTER — Other Ambulatory Visit: Payer: Self-pay | Admitting: Family Medicine

## 2016-04-22 ENCOUNTER — Ambulatory Visit: Payer: BLUE CROSS/BLUE SHIELD | Admitting: Family Medicine

## 2016-05-02 ENCOUNTER — Encounter: Payer: Self-pay | Admitting: Family Medicine

## 2016-05-02 ENCOUNTER — Ambulatory Visit (INDEPENDENT_AMBULATORY_CARE_PROVIDER_SITE_OTHER): Payer: BLUE CROSS/BLUE SHIELD | Admitting: Family Medicine

## 2016-05-02 VITALS — BP 158/90 | HR 92 | Temp 99.4°F | Ht 68.0 in | Wt 303.0 lb

## 2016-05-02 DIAGNOSIS — T7840XA Allergy, unspecified, initial encounter: Secondary | ICD-10-CM

## 2016-05-02 DIAGNOSIS — E669 Obesity, unspecified: Secondary | ICD-10-CM | POA: Diagnosis not present

## 2016-05-02 DIAGNOSIS — E119 Type 2 diabetes mellitus without complications: Secondary | ICD-10-CM

## 2016-05-02 DIAGNOSIS — J019 Acute sinusitis, unspecified: Secondary | ICD-10-CM

## 2016-05-02 DIAGNOSIS — Z114 Encounter for screening for human immunodeficiency virus [HIV]: Secondary | ICD-10-CM

## 2016-05-02 DIAGNOSIS — Z0000 Encounter for general adult medical examination without abnormal findings: Secondary | ICD-10-CM

## 2016-05-02 DIAGNOSIS — I10 Essential (primary) hypertension: Secondary | ICD-10-CM

## 2016-05-02 DIAGNOSIS — E785 Hyperlipidemia, unspecified: Secondary | ICD-10-CM

## 2016-05-02 DIAGNOSIS — J0101 Acute recurrent maxillary sinusitis: Secondary | ICD-10-CM

## 2016-05-02 DIAGNOSIS — E1169 Type 2 diabetes mellitus with other specified complication: Secondary | ICD-10-CM

## 2016-05-02 HISTORY — DX: Acute sinusitis, unspecified: J01.90

## 2016-05-02 LAB — COMPREHENSIVE METABOLIC PANEL
ALK PHOS: 90 U/L (ref 39–117)
ALT: 14 U/L (ref 0–35)
AST: 14 U/L (ref 0–37)
Albumin: 3.9 g/dL (ref 3.5–5.2)
BILIRUBIN TOTAL: 0.4 mg/dL (ref 0.2–1.2)
BUN: 16 mg/dL (ref 6–23)
CALCIUM: 9.3 mg/dL (ref 8.4–10.5)
CO2: 29 mEq/L (ref 19–32)
CREATININE: 0.82 mg/dL (ref 0.40–1.20)
Chloride: 101 mEq/L (ref 96–112)
GFR: 79.85 mL/min (ref >60.00)
GLUCOSE: 168 mg/dL — AB (ref 70–99)
Potassium: 4.8 mEq/L (ref 3.5–5.1)
Sodium: 137 mEq/L (ref 135–145)
TOTAL PROTEIN: 7.1 g/dL (ref 6.0–8.3)

## 2016-05-02 LAB — CBC
HCT: 38.1 % (ref 36.0–46.0)
HEMOGLOBIN: 12.8 g/dL (ref 12.0–15.0)
MCHC: 33.7 g/dL (ref 30.0–36.0)
MCV: 89.5 fl (ref 78.0–100.0)
PLATELETS: 347 10*3/uL (ref 150.0–400.0)
RBC: 4.26 Mil/uL (ref 3.87–5.11)
RDW: 13.3 % (ref 11.5–15.5)
WBC: 8.4 10*3/uL (ref 4.0–10.5)

## 2016-05-02 LAB — HEMOGLOBIN A1C: HEMOGLOBIN A1C: 7.1 % — AB (ref 4.6–6.5)

## 2016-05-02 LAB — LIPID PANEL
CHOL/HDL RATIO: 4
Cholesterol: 101 mg/dL (ref 0–200)
HDL: 23.3 mg/dL — ABNORMAL LOW (ref >39.00)
LDL Cholesterol: 44 mg/dL (ref 0–99)
NonHDL: 77.56
TRIGLYCERIDES: 169 mg/dL — AB (ref 0.0–149.0)
VLDL: 33.8 mg/dL (ref 0.0–40.0)

## 2016-05-02 LAB — TSH: TSH: 2.15 u[IU]/mL (ref 0.35–4.50)

## 2016-05-02 MED ORDER — AMOXICILLIN-POT CLAVULANATE 875-125 MG PO TABS: 1.0000 | ORAL_TABLET | Freq: Two times a day (BID) | ORAL | Status: DC

## 2016-05-02 MED ORDER — METOPROLOL TARTRATE 25 MG PO TABS: 25.0000 mg | ORAL_TABLET | Freq: Two times a day (BID) | ORAL | Status: DC

## 2016-05-02 MED ORDER — GLIPIZIDE 5 MG PO TABS: ORAL_TABLET | ORAL | Status: DC

## 2016-05-02 MED ORDER — HYDROCHLOROTHIAZIDE 25 MG PO TABS: ORAL_TABLET | ORAL | Status: DC

## 2016-05-02 MED ORDER — GABAPENTIN 100 MG PO CAPS: ORAL_CAPSULE | ORAL | Status: DC

## 2016-05-02 MED ORDER — LORAZEPAM 0.5 MG PO TABS: ORAL_TABLET | ORAL | Status: DC

## 2016-05-02 MED ORDER — MONTELUKAST SODIUM 10 MG PO TABS: 10.0000 mg | ORAL_TABLET | Freq: Every day | ORAL | Status: DC

## 2016-05-02 MED ORDER — GLUCOSE BLOOD VI STRP: ORAL_STRIP | Status: DC

## 2016-05-02 MED ORDER — ONETOUCH LANCETS MISC: Status: DC

## 2016-05-02 MED ORDER — FLUCONAZOLE 150 MG PO TABS: ORAL_TABLET | ORAL | Status: DC

## 2016-05-02 MED ORDER — LISINOPRIL 20 MG PO TABS: ORAL_TABLET | ORAL | Status: DC

## 2016-05-02 MED ORDER — ATORVASTATIN CALCIUM 20 MG PO TABS: 20.0000 mg | ORAL_TABLET | Freq: Every day | ORAL | Status: DC

## 2016-05-02 MED ORDER — METFORMIN HCL 500 MG PO TABS: 500.0000 mg | ORAL_TABLET | Freq: Every day | ORAL | Status: DC

## 2016-05-02 MED ORDER — METFORMIN HCL 1000 MG PO TABS: ORAL_TABLET | ORAL | Status: DC

## 2016-05-02 NOTE — Progress Notes (Signed)
Pre visit review using our clinic review tool, if applicable. No additional management support is needed unless otherwise documented below in the visit note. 

## 2016-05-02 NOTE — Patient Instructions (Addendum)
Check with insurance to see if they will cover any of these Contrave, Belviq, and Qsymia       Mucinex 2 times daily for a week and otc Nasacort.           Sinusitis Sinusitis is redness, soreness, and inflammation of the paranasal sinuses. Paranasal sinuses are air pockets within the bones of your face. They are located beneath your eyes, in the middle of your forehead, and above your eyes. In healthy paranasal sinuses, mucus is able to drain out, and air is able to circulate through them by way of your nose. However, when your paranasal sinuses are inflamed, mucus and air can become trapped. This can allow bacteria and other germs to grow and cause infection. Sinusitis can develop quickly and last only a short time (acute) or continue over a long period (chronic). Sinusitis that lasts for more than 12 weeks is considered chronic. CAUSES Causes of sinusitis include:  Allergies.  Structural abnormalities, such as displacement of the cartilage that separates your nostrils (deviated septum), which can decrease the air flow through your nose and sinuses and affect sinus drainage.  Functional abnormalities, such as when the small hairs (cilia) that line your sinuses and help remove mucus do not work properly or are not present. SIGNS AND SYMPTOMS Symptoms of acute and chronic sinusitis are the same. The primary symptoms are pain and pressure around the affected sinuses. Other symptoms include:  Upper toothache.  Earache.  Headache.  Bad breath.  Decreased sense of smell and taste.  A cough, which worsens when you are lying flat.  Fatigue.  Fever.  Thick drainage from your nose, which often is green and may contain pus (purulent).  Swelling and warmth over the affected sinuses. DIAGNOSIS Your health care provider will perform a physical exam. During your exam, your health care provider may perform any of the following to help determine if you have acute sinusitis or chronic  sinusitis:  Look in your nose for signs of abnormal growths in your nostrils (nasal polyps).  Tap over the affected sinus to check for signs of infection.  View the inside of your sinuses using an imaging device that has a light attached (endoscope). If your health care provider suspects that you have chronic sinusitis, one or more of the following tests may be recommended:  Allergy tests.  Nasal culture. A sample of mucus is taken from your nose, sent to a lab, and screened for bacteria.  Nasal cytology. A sample of mucus is taken from your nose and examined by your health care provider to determine if your sinusitis is related to an allergy. TREATMENT Most cases of acute sinusitis are related to a viral infection and will resolve on their own within 10 days. Sometimes, medicines are prescribed to help relieve symptoms of both acute and chronic sinusitis. These may include pain medicines, decongestants, nasal steroid sprays, or saline sprays. However, for sinusitis related to a bacterial infection, your health care provider will prescribe antibiotic medicines. These are medicines that will help kill the bacteria causing the infection. Rarely, sinusitis is caused by a fungal infection. In these cases, your health care provider will prescribe antifungal medicine. For some cases of chronic sinusitis, surgery is needed. Generally, these are cases in which sinusitis recurs more than 3 times per year, despite other treatments. HOME CARE INSTRUCTIONS  Drink plenty of water. Water helps thin the mucus so your sinuses can drain more easily.  Use a humidifier.  Inhale steam 3-4  times a day (for example, sit in the bathroom with the shower running).  Apply a warm, moist washcloth to your face 3-4 times a day, or as directed by your health care provider.  Use saline nasal sprays to help moisten and clean your sinuses.  Take medicines only as directed by your health care provider.  If you were  prescribed either an antibiotic or antifungal medicine, finish it all even if you start to feel better. SEEK IMMEDIATE MEDICAL CARE IF:  You have increasing pain or severe headaches.  You have nausea, vomiting, or drowsiness.  You have swelling around your face.  You have vision problems.  You have a stiff neck.  You have difficulty breathing.   This information is not intended to replace advice given to you by your health care provider. Make sure you discuss any questions you have with your health care provider.   Document Released: 12/15/2005 Document Revised: 01/05/2015 Document Reviewed: 12/30/2011 Elsevier Interactive Patient Education [employer]

## 2016-05-02 NOTE — Assessment & Plan Note (Signed)
Encouraged heart healthy diet, increase exercise, avoid trans fats, consider a krill oil cap daily. Tolerating Atorvastatin continue the same

## 2016-05-02 NOTE — Assessment & Plan Note (Signed)
hgba1c acceptable, minimize simple carbs. Increase exercise as tolerated. Continue current meds 

## 2016-05-02 NOTE — Assessment & Plan Note (Signed)
Encouraged DASH diet, decrease po intake and increase exercise as tolerated. Needs 7-8 hours of sleep nightly. Avoid trans fats, eat small, frequent meals every 4-5 hours with lean proteins, complex carbs and healthy fats. Minimize simple carbs 

## 2016-05-02 NOTE — Assessment & Plan Note (Signed)
Augmentin, Mucinex and Diflucan

## 2016-05-02 NOTE — Progress Notes (Signed)
Subjective:    Patient ID: April Warren, female    DOB: 06-14-1971, 45 y.o.   MRN: [id number]  Chief Complaint  Patient presents with  . Follow-up    HPI Patient is in today for follow up and reports having some congestion, green sputum production, sinus pressure and headaches  which she has been treating with OTC afrin and Sudafed.  Patient also reports she plans to taking in foster children and has brought in paperwork to get this process started today.  Denies CP/palp/SOB/fevers/GI or GU c/o. Taking meds as prescribed.   Past Medical History  Diagnosis Date  . Diabetes mellitus 08-12-12    type 2- dr Zigmund Daniel diagnosed  . Chicken pox 45 yrs old  . Diabetic retinopathy ([place])   . Obesity   . HTN (hypertension) 08/20/2012  . Allergy   . Preventative health care 08/20/2012  . Dysmenorrhea 08/20/2012  . Other and unspecified hyperlipidemia 08/28/2013  . Obesity, unspecified 08/28/2013  . Sinusitis 11/14/2013  . Sinusitis, acute 05/02/2016    Past Surgical History  Procedure Laterality Date  . Refractive surgery  08-12-12    right, left on 09/16/12    Family History  Problem Relation Age of Onset  . Cancer Mother 110    breast  . Diabetes Mother     type 2  . Hypertension Mother   . Hypertension Father   . Hyperlipidemia Father   . COPD Father   . Hypertension Maternal Grandmother   . Hyperlipidemia Maternal Grandmother   . COPD Maternal Grandmother   . Asthma Maternal Grandmother   . Hypertension Maternal Grandfather   . Heart attack Maternal Grandfather   . Gout Brother   . Cancer Brother     lung  . Heart disease Paternal Grandfather     Social History   Social History  . Marital Status: Married    Spouse Name: N/A  . Number of Children: N/A  . Years of Education: N/A   Occupational History  . Not on file.   Social History Main Topics  . Smoking status: Never Smoker   . Smokeless tobacco: Never Used  . Alcohol Use: Yes     Comment: socially  . Drug Use:  No  . Sexual Activity:    Partners: Male   Other Topics Concern  . Not on file   Social History Narrative    Outpatient Prescriptions Prior to Visit  Medication Sig Dispense Refill  . Blood Glucose Monitoring Suppl (ONE TOUCH BASIC SYSTEM) W/DEVICE KIT Test bid prn 1 each 0  . ibuprofen (ADVIL,MOTRIN) 200 MG tablet Take 400 mg by mouth every 8 (eight) hours as needed.    Marland Kitchen atorvastatin (LIPITOR) 20 MG tablet Take 1 tablet (20 mg total) by mouth daily. 90 tablet 1  . cephALEXin (KEFLEX) 500 MG capsule Take 1 capsule (500 mg total) by mouth 3 (three) times daily. 30 capsule 0  . fluconazole (DIFLUCAN) 150 MG tablet 1 tab po weekly x 2 days 2 tablet 1  . gabapentin (NEURONTIN) 100 MG capsule TAKE 1 TO 3 CAPSULES BY MOUTH EVERY NIGHT AT BEDTIME AS DIRECTED 90 capsule 0  . glipiZIDE (GLUCOTROL) 5 MG tablet TAKE 1 TABLET (5 MG TOTAL) BY MOUTH 2 (TWO) TIMES DAILY BEFORE A MEAL. 180 tablet 1  . glucose blood (ONE TOUCH ULTRA TEST) test strip TEST TWICE A DAY DX E11.9 100 each 1  . hydrochlorothiazide (HYDRODIURIL) 25 MG tablet TAKE 1 TABLET (25 MG TOTAL) BY MOUTH DAILY. [place]  tablet 1  . lisinopril (PRINIVIL,ZESTRIL) 20 MG tablet TAKE 1 TABLET (20 MG TOTAL) BY MOUTH 2 (TWO) TIMES DAILY. 180 tablet 1  . LORazepam (ATIVAN) 0.5 MG tablet TAKE 1 TABLET TWICE A DAY AS NEEDED FOR ANXIETY OR SLEEP 40 tablet 0  . metFORMIN (GLUCOPHAGE) 1000 MG tablet TAKE 1 TABLET (1,000 MG TOTAL) BY MOUTH 2 (TWO) TIMES DAILY WITH A MEAL. 180 tablet 1  . metFORMIN (GLUCOPHAGE) 500 MG tablet Take 1 tablet (500 mg total) by mouth daily. 90 tablet 1  . ONE TOUCH LANCETS MISC Check two daily DX E11.9 200 each 12  . atorvastatin (LIPITOR) 20 MG tablet Take 1 tablet (20 mg total) by mouth daily. 30 tablet 6   No facility-administered medications prior to visit.    No Known Allergies  Review of Systems  Constitutional: Negative for fever and malaise/fatigue.  HENT: Positive for congestion.   Eyes: Negative for blurred  vision.  Respiratory: Positive for sputum production. Negative for shortness of breath.   Cardiovascular: Negative for chest pain, palpitations and leg swelling.  Gastrointestinal: Negative for nausea, abdominal pain and blood in stool.  Genitourinary: Negative for dysuria and frequency.  Musculoskeletal: Negative for falls.  Skin: Negative for rash.  Neurological: Positive for headaches. Negative for dizziness and loss of consciousness.  Endo/Heme/Allergies: Negative for environmental allergies.  Psychiatric/Behavioral: Negative for depression. The patient is not nervous/anxious.        Objective:    Physical Exam  Constitutional: She is oriented to person, place, and time. She appears well-developed and well-nourished. No distress.  HENT:  Head: Normocephalic and atraumatic.  Eyes: Conjunctivae are normal.  Neck: Neck supple. No thyromegaly present.  Cardiovascular: Normal rate, regular rhythm and normal heart sounds.   No murmur heard. Pulmonary/Chest: Effort normal and breath sounds normal. No respiratory distress.  Abdominal: Soft. Bowel sounds are normal. She exhibits no distension and no mass. There is no tenderness.  Musculoskeletal: She exhibits no edema.  Lymphadenopathy:    She has no cervical adenopathy.  Neurological: She is alert and oriented to person, place, and time.  Skin: Skin is warm and dry.  Psychiatric: She has a normal mood and affect. Her behavior is normal.    BP 158/90 mmHg  Pulse 92  Temp(Src) 99.4 F (37.4 C) (Oral)  Ht 5' 8"  (1.727 m)  Wt 303 lb (137.44 kg)  BMI 46.08 kg/m2  SpO2 96% Wt Readings from Last 3 Encounters:  05/02/16 303 lb (137.44 kg)  01/29/16 296 lb 2 oz (134.321 kg)  05/18/15 296 lb 8 oz (134.492 kg)     Lab Results  Component Value Date   WBC 8.4 05/02/2016   HGB 12.8 05/02/2016   HCT 38.1 05/02/2016   PLT 347.0 05/02/2016   GLUCOSE 168* 05/02/2016   CHOL 101 05/02/2016   TRIG 169.0* 05/02/2016   HDL 23.30*  05/02/2016   LDLDIRECT 54.0 01/29/2016   LDLCALC 44 05/02/2016   ALT 14 05/02/2016   AST 14 05/02/2016   NA 137 05/02/2016   K 4.8 05/02/2016   CL 101 05/02/2016   CREATININE 0.82 05/02/2016   BUN 16 05/02/2016   CO2 29 05/02/2016   TSH 2.15 05/02/2016   INR 1.03 03/04/2011   HGBA1C 7.1* 05/02/2016   MICROALBUR 19.6* 01/29/2016    Lab Results  Component Value Date   TSH 2.15 05/02/2016   Lab Results  Component Value Date   WBC 8.4 05/02/2016   HGB 12.8 05/02/2016   HCT 38.1 05/02/2016  MCV 89.5 05/02/2016   PLT 347.0 05/02/2016   Lab Results  Component Value Date   NA 137 05/02/2016   K 4.8 05/02/2016   CO2 29 05/02/2016   GLUCOSE 168* 05/02/2016   BUN 16 05/02/2016   CREATININE 0.82 05/02/2016   BILITOT 0.4 05/02/2016   ALKPHOS 90 05/02/2016   AST 14 05/02/2016   ALT 14 05/02/2016   PROT 7.1 05/02/2016   ALBUMIN 3.9 05/02/2016   CALCIUM 9.3 05/02/2016   GFR 79.85 05/02/2016   Lab Results  Component Value Date   CHOL 101 05/02/2016   Lab Results  Component Value Date   HDL 23.30* 05/02/2016   Lab Results  Component Value Date   LDLCALC 44 05/02/2016   Lab Results  Component Value Date   TRIG 169.0* 05/02/2016   Lab Results  Component Value Date   CHOLHDL 4 05/02/2016   Lab Results  Component Value Date   HGBA1C 7.1* 05/02/2016       Assessment & Plan:   Problem List Items Addressed This Visit    Sinusitis, acute    Augmentin, Mucinex and Diflucan      Relevant Medications   amoxicillin-clavulanate (AUGMENTIN) 875-125 MG tablet   fluconazole (DIFLUCAN) 150 MG tablet   Preventative health care   Relevant Medications   gabapentin (NEURONTIN) 100 MG capsule   glipiZIDE (GLUCOTROL) 5 MG tablet   glucose blood (ONE TOUCH ULTRA TEST) test strip   hydrochlorothiazide (HYDRODIURIL) 25 MG tablet   lisinopril (PRINIVIL,ZESTRIL) 20 MG tablet   LORazepam (ATIVAN) 0.5 MG tablet   metFORMIN (GLUCOPHAGE) 1000 MG tablet   metFORMIN  (GLUCOPHAGE) 500 MG tablet   ONE TOUCH LANCETS MISC   atorvastatin (LIPITOR) 20 MG tablet   Other Relevant Orders   Hemoglobin A1c (Completed)   CBC (Completed)   Comprehensive metabolic panel (Completed)   TSH (Completed)   Lipid panel (Completed)   Obesity    Encouraged DASH diet, decrease po intake and increase exercise as tolerated. Needs 7-8 hours of sleep nightly. Avoid trans fats, eat small, frequent meals every 4-5 hours with lean proteins, complex carbs and healthy fats. Minimize simple carbs      Relevant Medications   gabapentin (NEURONTIN) 100 MG capsule   glipiZIDE (GLUCOTROL) 5 MG tablet   glucose blood (ONE TOUCH ULTRA TEST) test strip   hydrochlorothiazide (HYDRODIURIL) 25 MG tablet   lisinopril (PRINIVIL,ZESTRIL) 20 MG tablet   LORazepam (ATIVAN) 0.5 MG tablet   metFORMIN (GLUCOPHAGE) 1000 MG tablet   metFORMIN (GLUCOPHAGE) 500 MG tablet   ONE TOUCH LANCETS MISC   atorvastatin (LIPITOR) 20 MG tablet   Other Relevant Orders   Hemoglobin A1c (Completed)   CBC (Completed)   Comprehensive metabolic panel (Completed)   TSH (Completed)   Lipid panel (Completed)   Hyperlipemia    Encouraged heart healthy diet, increase exercise, avoid trans fats, consider a krill oil cap daily. Tolerating Atorvastatin continue the same      Relevant Medications   gabapentin (NEURONTIN) 100 MG capsule   glipiZIDE (GLUCOTROL) 5 MG tablet   glucose blood (ONE TOUCH ULTRA TEST) test strip   hydrochlorothiazide (HYDRODIURIL) 25 MG tablet   lisinopril (PRINIVIL,ZESTRIL) 20 MG tablet   LORazepam (ATIVAN) 0.5 MG tablet   metFORMIN (GLUCOPHAGE) 1000 MG tablet   metFORMIN (GLUCOPHAGE) 500 MG tablet   ONE TOUCH LANCETS MISC   atorvastatin (LIPITOR) 20 MG tablet   metoprolol tartrate (LOPRESSOR) 25 MG tablet   Other Relevant Orders   Hemoglobin A1c (Completed)  CBC (Completed)   Comprehensive metabolic panel (Completed)   TSH (Completed)   Lipid panel (Completed)   HTN  (hypertension) - Primary    Elevated will add Metoprolol 25 mg po bid.. Encouraged heart healthy diet such as the DASH diet and exercise as tolerated.       Relevant Medications   gabapentin (NEURONTIN) 100 MG capsule   glipiZIDE (GLUCOTROL) 5 MG tablet   glucose blood (ONE TOUCH ULTRA TEST) test strip   hydrochlorothiazide (HYDRODIURIL) 25 MG tablet   lisinopril (PRINIVIL,ZESTRIL) 20 MG tablet   LORazepam (ATIVAN) 0.5 MG tablet   metFORMIN (GLUCOPHAGE) 1000 MG tablet   metFORMIN (GLUCOPHAGE) 500 MG tablet   ONE TOUCH LANCETS MISC   atorvastatin (LIPITOR) 20 MG tablet   metoprolol tartrate (LOPRESSOR) 25 MG tablet   Other Relevant Orders   Hemoglobin A1c (Completed)   CBC (Completed)   Comprehensive metabolic panel (Completed)   TSH (Completed)   Lipid panel (Completed)   Diabetes mellitus type 2 in obese (HCC)    hgba1c acceptable, minimize simple carbs. Increase exercise as tolerated. Continue current meds      Relevant Medications   gabapentin (NEURONTIN) 100 MG capsule   glipiZIDE (GLUCOTROL) 5 MG tablet   glucose blood (ONE TOUCH ULTRA TEST) test strip   hydrochlorothiazide (HYDRODIURIL) 25 MG tablet   lisinopril (PRINIVIL,ZESTRIL) 20 MG tablet   LORazepam (ATIVAN) 0.5 MG tablet   metFORMIN (GLUCOPHAGE) 1000 MG tablet   metFORMIN (GLUCOPHAGE) 500 MG tablet   ONE TOUCH LANCETS MISC   atorvastatin (LIPITOR) 20 MG tablet   Other Relevant Orders   Hemoglobin A1c (Completed)   CBC (Completed)   Comprehensive metabolic panel (Completed)   TSH (Completed)   Lipid panel (Completed)   Allergic state    Zyrtec, Nasacort and add Singulair       Other Visit Diagnoses    Screening for HIV (human immunodeficiency virus)        Relevant Orders    HIV antibody (with reflex)       I have discontinued Ms. Amiri's cephALEXin. I am also having her start on metoprolol tartrate, montelukast, and amoxicillin-clavulanate. Additionally, I am having her maintain her ibuprofen, ONE  TOUCH BASIC SYSTEM, gabapentin, glipiZIDE, glucose blood, hydrochlorothiazide, lisinopril, LORazepam, metFORMIN, metFORMIN, ONE TOUCH LANCETS, atorvastatin, and fluconazole.  Meds ordered this encounter  Medications  . gabapentin (NEURONTIN) 100 MG capsule    Sig: TAKE 1 TO 3 CAPSULES BY MOUTH EVERY NIGHT AT BEDTIME AS DIRECTED    Dispense:  90 capsule    Refill:  1  . glipiZIDE (GLUCOTROL) 5 MG tablet    Sig: TAKE 1 TABLET (5 MG TOTAL) BY MOUTH 2 (TWO) TIMES DAILY BEFORE A MEAL.    Dispense:  180 tablet    Refill:  1  . glucose blood (ONE TOUCH ULTRA TEST) test strip    Sig: TEST TWICE A DAY DX E11.9    Dispense:  100 each    Refill:  5    D/C PREVIOUS SCRIPTS FOR THIS MEDICATION  . hydrochlorothiazide (HYDRODIURIL) 25 MG tablet    Sig: TAKE 1 TABLET (25 MG TOTAL) BY MOUTH DAILY.    Dispense:  90 tablet    Refill:  1  . lisinopril (PRINIVIL,ZESTRIL) 20 MG tablet    Sig: TAKE 1 TABLET (20 MG TOTAL) BY MOUTH 2 (TWO) TIMES DAILY.    Dispense:  180 tablet    Refill:  1  . LORazepam (ATIVAN) 0.5 MG tablet    Sig:  TAKE 1 TABLET TWICE A DAY AS NEEDED FOR ANXIETY OR SLEEP    Dispense:  40 tablet    Refill:  0    This request is for a new prescription for a controlled substance as required by Federal/State law.  . metFORMIN (GLUCOPHAGE) 1000 MG tablet    Sig: TAKE 1 TABLET (1,000 MG TOTAL) BY MOUTH 2 (TWO) TIMES DAILY WITH A MEAL.    Dispense:  180 tablet    Refill:  1  . metFORMIN (GLUCOPHAGE) 500 MG tablet    Sig: Take 1 tablet (500 mg total) by mouth daily.    Dispense:  90 tablet    Refill:  1  . ONE TOUCH LANCETS MISC    Sig: Check two daily DX E11.9    Dispense:  200 each    Refill:  12  . atorvastatin (LIPITOR) 20 MG tablet    Sig: Take 1 tablet (20 mg total) by mouth daily.    Dispense:  90 tablet    Refill:  1  . metoprolol tartrate (LOPRESSOR) 25 MG tablet    Sig: Take 1 tablet (25 mg total) by mouth 2 (two) times daily.    Dispense:  180 tablet    Refill:  1  .  montelukast (SINGULAIR) 10 MG tablet    Sig: Take 1 tablet (10 mg total) by mouth at bedtime.    Dispense:  30 tablet    Refill:  3  . amoxicillin-clavulanate (AUGMENTIN) 875-125 MG tablet    Sig: Take 1 tablet by mouth 2 (two) times daily.    Dispense:  20 tablet    Refill:  0  . fluconazole (DIFLUCAN) 150 MG tablet    Sig: 1 tab po weekly x 2 days    Dispense:  2 tablet    Refill:  1     BLYTH, STACEY, MD

## 2016-05-02 NOTE — Assessment & Plan Note (Signed)
Zyrtec, Nasacort and add Singulair

## 2016-05-02 NOTE — Assessment & Plan Note (Addendum)
Elevated will add Metoprolol 25 mg po bid.. Encouraged heart healthy diet such as the DASH diet and exercise as tolerated.

## 2016-05-03 LAB — HIV ANTIBODY (ROUTINE TESTING W REFLEX): HIV 1&2 Ab, 4th Generation: NONREACTIVE

## 2016-06-05 ENCOUNTER — Other Ambulatory Visit: Payer: Self-pay | Admitting: Family Medicine

## 2016-06-06 ENCOUNTER — Ambulatory Visit (INDEPENDENT_AMBULATORY_CARE_PROVIDER_SITE_OTHER): Payer: BLUE CROSS/BLUE SHIELD | Admitting: Family Medicine

## 2016-06-06 ENCOUNTER — Encounter: Payer: Self-pay | Admitting: Family Medicine

## 2016-06-06 VITALS — BP 170/90 | HR 83 | Temp 100.0°F | Ht 69.0 in | Wt 309.2 lb

## 2016-06-06 DIAGNOSIS — J209 Acute bronchitis, unspecified: Secondary | ICD-10-CM

## 2016-06-06 DIAGNOSIS — I10 Essential (primary) hypertension: Secondary | ICD-10-CM | POA: Diagnosis not present

## 2016-06-06 DIAGNOSIS — E785 Hyperlipidemia, unspecified: Secondary | ICD-10-CM | POA: Diagnosis not present

## 2016-06-06 DIAGNOSIS — E669 Obesity, unspecified: Secondary | ICD-10-CM

## 2016-06-06 DIAGNOSIS — E1169 Type 2 diabetes mellitus with other specified complication: Secondary | ICD-10-CM

## 2016-06-06 DIAGNOSIS — E119 Type 2 diabetes mellitus without complications: Secondary | ICD-10-CM | POA: Diagnosis not present

## 2016-06-06 MED ORDER — METOPROLOL TARTRATE 50 MG PO TABS: 50.0000 mg | ORAL_TABLET | Freq: Two times a day (BID) | ORAL | Status: DC

## 2016-06-06 MED ORDER — HYDROCODONE-HOMATROPINE 5-1.5 MG/5ML PO SYRP: 5.0000 mL | ORAL_SOLUTION | Freq: Three times a day (TID) | ORAL | Status: DC | PRN

## 2016-06-06 MED ORDER — FLUCONAZOLE 150 MG PO TABS: ORAL_TABLET | ORAL | Status: DC

## 2016-06-06 MED ORDER — ALBUTEROL SULFATE HFA 108 (90 BASE) MCG/ACT IN AERS: 2.0000 | INHALATION_SPRAY | Freq: Four times a day (QID) | RESPIRATORY_TRACT | Status: DC | PRN

## 2016-06-06 MED ORDER — PENICILLIN V POTASSIUM 500 MG PO TABS: 500.0000 mg | ORAL_TABLET | Freq: Four times a day (QID) | ORAL | Status: DC

## 2016-06-06 NOTE — Patient Instructions (Signed)

## 2016-06-16 ENCOUNTER — Other Ambulatory Visit: Payer: Self-pay | Admitting: Family Medicine

## 2016-06-18 DIAGNOSIS — J209 Acute bronchitis, unspecified: Secondary | ICD-10-CM | POA: Insufficient documentation

## 2016-06-18 NOTE — Assessment & Plan Note (Signed)
Encouraged DASH diet, decrease po intake and increase exercise as tolerated. Needs 7-8 hours of sleep nightly. Avoid trans fats, eat small, frequent meals every 4-5 hours with lean proteins, complex carbs and healthy fats. Minimize simple carbs 

## 2016-06-18 NOTE — Assessment & Plan Note (Signed)
hgba1c acceptable, minimize simple carbs. Increase exercise as tolerated. Continue current meds 

## 2016-06-18 NOTE — Assessment & Plan Note (Signed)
Encouraged increased rest and hydration, add probiotics, zinc such as Coldeze or Xicam. Treat fevers as needed. Cough syrup and antibiotics

## 2016-06-18 NOTE — Assessment & Plan Note (Signed)
Tolerating statin, encouraged heart healthy diet, avoid trans fats, minimize simple carbs and saturated fats. Increase exercise as tolerated 

## 2016-06-18 NOTE — Progress Notes (Signed)
Patient ID: April Warren, female   DOB: [birth year], 45 y.o.   MRN: [id number]   Subjective:    Patient ID: April Warren, female    DOB: 07-11-1971, 45 y.o.   MRN: [id number]  Chief Complaint  Patient presents with  . Hypertension  . Nasal Congestion  . Cough    HPI Patient is in today for follow up has been struggling with elevated blood pressure and congestion. More head than chest congestion productive of green phlegm, she notes headache, fatigue and cough that is productive. Head hurts and sore throat with fever is also noted. Denies CP/palp/SOB/GI or GU c/o. Taking meds as prescribed  Past Medical History  Diagnosis Date  . Diabetes mellitus 08-12-12    type 2- dr Zigmund Daniel diagnosed  . Chicken pox 45 yrs old  . Diabetic retinopathy ([place])   . Obesity   . HTN (hypertension) 08/20/2012  . Allergy   . Preventative health care 08/20/2012  . Dysmenorrhea 08/20/2012  . Other and unspecified hyperlipidemia 08/28/2013  . Obesity, unspecified 08/28/2013  . Sinusitis 11/14/2013  . Sinusitis, acute 05/02/2016    Past Surgical History  Procedure Laterality Date  . Refractive surgery  08-12-12    right, left on 09/16/12    Family History  Problem Relation Age of Onset  . Cancer Mother 31    breast  . Diabetes Mother     type 2  . Hypertension Mother   . Hypertension Father   . Hyperlipidemia Father   . COPD Father   . Hypertension Maternal Grandmother   . Hyperlipidemia Maternal Grandmother   . COPD Maternal Grandmother   . Asthma Maternal Grandmother   . Hypertension Maternal Grandfather   . Heart attack Maternal Grandfather   . Gout Brother   . Cancer Brother     lung  . Heart disease Paternal Grandfather     Social History   Social History  . Marital Status: Married    Spouse Name: N/A  . Number of Children: N/A  . Years of Education: N/A   Occupational History  . Not on file.   Social History Main Topics  . Smoking status: Never Smoker   . Smokeless tobacco: Never  Used  . Alcohol Use: Yes     Comment: socially  . Drug Use: No  . Sexual Activity:    Partners: Male   Other Topics Concern  . Not on file   Social History Narrative    Outpatient Prescriptions Prior to Visit  Medication Sig Dispense Refill  . atorvastatin (LIPITOR) 20 MG tablet Take 1 tablet (20 mg total) by mouth daily. 90 tablet 1  . Blood Glucose Monitoring Suppl (ONE TOUCH BASIC SYSTEM) W/DEVICE KIT Test bid prn 1 each 0  . glipiZIDE (GLUCOTROL) 5 MG tablet TAKE 1 TABLET (5 MG TOTAL) BY MOUTH 2 (TWO) TIMES DAILY BEFORE A MEAL. 180 tablet 1  . glucose blood (ONE TOUCH ULTRA TEST) test strip TEST TWICE A DAY DX E11.9 100 each 5  . hydrochlorothiazide (HYDRODIURIL) 25 MG tablet TAKE 1 TABLET (25 MG TOTAL) BY MOUTH DAILY. 90 tablet 1  . ibuprofen (ADVIL,MOTRIN) 200 MG tablet Take 400 mg by mouth every 8 (eight) hours as needed.    Marland Kitchen lisinopril (PRINIVIL,ZESTRIL) 20 MG tablet TAKE 1 TABLET (20 MG TOTAL) BY MOUTH 2 (TWO) TIMES DAILY. 180 tablet 1  . LORazepam (ATIVAN) 0.5 MG tablet TAKE 1 TABLET TWICE A DAY AS NEEDED FOR ANXIETY OR SLEEP 40 tablet 0  . metFORMIN (  GLUCOPHAGE) 1000 MG tablet TAKE 1 TABLET (1,000 MG TOTAL) BY MOUTH 2 (TWO) TIMES DAILY WITH A MEAL. 180 tablet 1  . metFORMIN (GLUCOPHAGE) 500 MG tablet TAKE (1) TABLET BY MOUTH ONCE DAILY. 30 tablet 5  . montelukast (SINGULAIR) 10 MG tablet Take 1 tablet (10 mg total) by mouth at bedtime. 30 tablet 3  . ONE TOUCH LANCETS MISC Check two daily DX E11.9 200 each 12  . fluconazole (DIFLUCAN) 150 MG tablet 1 tab po weekly x 2 days 2 tablet 1  . gabapentin (NEURONTIN) 100 MG capsule TAKE 1 TO 3 CAPSULES BY MOUTH EVERY NIGHT AT BEDTIME AS DIRECTED 90 capsule 1  . metoprolol tartrate (LOPRESSOR) 25 MG tablet Take 1 tablet (25 mg total) by mouth 2 (two) times daily. 180 tablet 1  . amoxicillin-clavulanate (AUGMENTIN) 875-125 MG tablet Take 1 tablet by mouth 2 (two) times daily. 20 tablet 0   No facility-administered medications prior  to visit.    No Known Allergies  Review of Systems  Constitutional: Negative for fever and malaise/fatigue.  HENT: Negative for congestion.   Eyes: Negative for blurred vision.  Respiratory: Negative for shortness of breath.   Cardiovascular: Negative for chest pain, palpitations and leg swelling.  Gastrointestinal: Negative for nausea, abdominal pain and blood in stool.  Genitourinary: Negative for dysuria and frequency.  Musculoskeletal: Negative for falls.  Skin: Negative for rash.  Neurological: Negative for dizziness, loss of consciousness and headaches.  Endo/Heme/Allergies: Negative for environmental allergies.  Psychiatric/Behavioral: Negative for depression. The patient is not nervous/anxious.        Objective:    Physical Exam  Constitutional: She is oriented to person, place, and time. She appears well-developed and well-nourished. No distress.  HENT:  Head: Normocephalic and atraumatic.  Nose: Nose normal.  Eyes: Right eye exhibits no discharge. Left eye exhibits no discharge.  Neck: Normal range of motion. Neck supple.  Cardiovascular: Normal rate and regular rhythm.   No murmur heard. Pulmonary/Chest: Effort normal and breath sounds normal.  Abdominal: Soft. Bowel sounds are normal. There is no tenderness.  Musculoskeletal: She exhibits no edema.  Neurological: She is alert and oriented to person, place, and time.  Skin: Skin is warm and dry.  Psychiatric: She has a normal mood and affect.  Nursing note and vitals reviewed.   BP 170/90 mmHg  Pulse 83  Temp(Src) 100 F (37.8 C) (Oral)  Ht 5' 9"  (1.753 m)  Wt 309 lb 4 oz (140.275 kg)  BMI 45.65 kg/m2  SpO2 97% Wt Readings from Last 3 Encounters:  06/06/16 309 lb 4 oz (140.275 kg)  05/02/16 303 lb (137.44 kg)  01/29/16 296 lb 2 oz (134.321 kg)     Lab Results  Component Value Date   WBC 8.4 05/02/2016   HGB 12.8 05/02/2016   HCT 38.1 05/02/2016   PLT 347.0 05/02/2016   GLUCOSE 168* 05/02/2016     CHOL 101 05/02/2016   TRIG 169.0* 05/02/2016   HDL 23.30* 05/02/2016   LDLDIRECT 54.0 01/29/2016   LDLCALC 44 05/02/2016   ALT 14 05/02/2016   AST 14 05/02/2016   NA 137 05/02/2016   K 4.8 05/02/2016   CL 101 05/02/2016   CREATININE 0.82 05/02/2016   BUN 16 05/02/2016   CO2 29 05/02/2016   TSH 2.15 05/02/2016   INR 1.03 03/04/2011   HGBA1C 7.1* 05/02/2016   MICROALBUR 19.6* 01/29/2016    Lab Results  Component Value Date   TSH 2.15 05/02/2016   Lab Results  Component Value Date   WBC 8.4 05/02/2016   HGB 12.8 05/02/2016   HCT 38.1 05/02/2016   MCV 89.5 05/02/2016   PLT 347.0 05/02/2016   Lab Results  Component Value Date   NA 137 05/02/2016   K 4.8 05/02/2016   CO2 29 05/02/2016   GLUCOSE 168* 05/02/2016   BUN 16 05/02/2016   CREATININE 0.82 05/02/2016   BILITOT 0.4 05/02/2016   ALKPHOS 90 05/02/2016   AST 14 05/02/2016   ALT 14 05/02/2016   PROT 7.1 05/02/2016   ALBUMIN 3.9 05/02/2016   CALCIUM 9.3 05/02/2016   GFR 79.85 05/02/2016   Lab Results  Component Value Date   CHOL 101 05/02/2016   Lab Results  Component Value Date   HDL 23.30* 05/02/2016   Lab Results  Component Value Date   LDLCALC 44 05/02/2016   Lab Results  Component Value Date   TRIG 169.0* 05/02/2016   Lab Results  Component Value Date   CHOLHDL 4 05/02/2016   Lab Results  Component Value Date   HGBA1C 7.1* 05/02/2016       Assessment & Plan:   Problem List Items Addressed This Visit    Obesity    Encouraged DASH diet, decrease po intake and increase exercise as tolerated. Needs 7-8 hours of sleep nightly. Avoid trans fats, eat small, frequent meals every 4-5 hours with lean proteins, complex carbs and healthy fats. Minimize simple carbs      Hyperlipemia    Tolerating statin, encouraged heart healthy diet, avoid trans fats, minimize simple carbs and saturated fats. Increase exercise as tolerated      Relevant Medications   metoprolol (LOPRESSOR) 50 MG tablet    HTN (hypertension) - Primary    Poorly controlled will alter medications, encouraged DASH diet, minimize caffeine and obtain adequate sleep. Report concerning symptoms and follow up as directed and as needed      Relevant Medications   albuterol (PROVENTIL HFA;VENTOLIN HFA) 108 (90 Base) MCG/ACT inhaler   HYDROcodone-homatropine (HYCODAN) 5-1.5 MG/5ML syrup   penicillin v potassium (VEETID) 500 MG tablet   metoprolol (LOPRESSOR) 50 MG tablet   Diabetes mellitus type 2 in obese ([facility])    hgba1c acceptable, minimize simple carbs. Increase exercise as tolerated. Continue current meds      Acute bronchitis    Encouraged increased rest and hydration, add probiotics, zinc such as Coldeze or Xicam. Treat fevers as needed. Cough syrup and antibiotics      Relevant Medications   albuterol (PROVENTIL HFA;VENTOLIN HFA) 108 (90 Base) MCG/ACT inhaler   HYDROcodone-homatropine (HYCODAN) 5-1.5 MG/5ML syrup   penicillin v potassium (VEETID) 500 MG tablet   metoprolol (LOPRESSOR) 50 MG tablet      I have discontinued Ms. Drees's metoprolol tartrate and amoxicillin-clavulanate. I am also having her start on albuterol, HYDROcodone-homatropine, penicillin v potassium, and metoprolol. Additionally, I am having her maintain her ibuprofen, ONE TOUCH BASIC SYSTEM, glipiZIDE, glucose blood, hydrochlorothiazide, lisinopril, LORazepam, metFORMIN, ONE TOUCH LANCETS, atorvastatin, montelukast, metFORMIN, and fluconazole.  Meds ordered this encounter  Medications  . albuterol (PROVENTIL HFA;VENTOLIN HFA) 108 (90 Base) MCG/ACT inhaler    Sig: Inhale 2 puffs into the lungs every 6 (six) hours as needed for wheezing or shortness of breath.    Dispense:  1 Inhaler    Refill:  0  . HYDROcodone-homatropine (HYCODAN) 5-1.5 MG/5ML syrup    Sig: Take 5 mLs by mouth every 8 (eight) hours as needed for cough.    Dispense:  120 mL  Refill:  0  . penicillin v potassium (VEETID) 500 MG tablet    Sig: Take 1 tablet (500 mg  total) by mouth 4 (four) times daily.    Dispense:  40 tablet    Refill:  0  . metoprolol (LOPRESSOR) 50 MG tablet    Sig: Take 1 tablet (50 mg total) by mouth 2 (two) times daily.    Dispense:  60 tablet    Refill:  3  . fluconazole (DIFLUCAN) 150 MG tablet    Sig: 1 tab po weekly x 2 days    Dispense:  2 tablet    Refill:  1     Reeta Kuk, MD

## 2016-06-18 NOTE — Assessment & Plan Note (Signed)
Poorly controlled will alter medications, encouraged DASH diet, minimize caffeine and obtain adequate sleep. Report concerning symptoms and follow up as directed and as needed 

## 2016-08-07 ENCOUNTER — Ambulatory Visit (INDEPENDENT_AMBULATORY_CARE_PROVIDER_SITE_OTHER): Payer: BLUE CROSS/BLUE SHIELD | Admitting: Family Medicine

## 2016-08-07 ENCOUNTER — Encounter: Payer: Self-pay | Admitting: Family Medicine

## 2016-08-07 VITALS — BP 140/84 | HR 64 | Temp 99.1°F | Resp 16 | Ht 69.0 in | Wt 303.2 lb

## 2016-08-07 DIAGNOSIS — I10 Essential (primary) hypertension: Secondary | ICD-10-CM | POA: Diagnosis not present

## 2016-08-07 DIAGNOSIS — E785 Hyperlipidemia, unspecified: Secondary | ICD-10-CM | POA: Diagnosis not present

## 2016-08-07 DIAGNOSIS — E119 Type 2 diabetes mellitus without complications: Secondary | ICD-10-CM | POA: Diagnosis not present

## 2016-08-07 DIAGNOSIS — E669 Obesity, unspecified: Secondary | ICD-10-CM | POA: Diagnosis not present

## 2016-08-07 DIAGNOSIS — E1169 Type 2 diabetes mellitus with other specified complication: Secondary | ICD-10-CM

## 2016-08-07 LAB — LIPID PANEL
CHOL/HDL RATIO: 4
CHOLESTEROL: 96 mg/dL (ref 0–200)
HDL: 22.3 mg/dL — AB (ref >39.00)
NonHDL: 73.23
TRIGLYCERIDES: 241 mg/dL — AB (ref 0.0–149.0)
VLDL: 48.2 mg/dL — AB (ref 0.0–40.0)

## 2016-08-07 LAB — TSH: TSH: 1.36 u[IU]/mL (ref 0.35–4.50)

## 2016-08-07 LAB — CBC
HEMATOCRIT: 38.1 % (ref 36.0–46.0)
Hemoglobin: 12.8 g/dL (ref 12.0–15.0)
MCHC: 33.6 g/dL (ref 30.0–36.0)
MCV: 88 fl (ref 78.0–100.0)
PLATELETS: 309 10*3/uL (ref 150.0–400.0)
RBC: 4.33 Mil/uL (ref 3.87–5.11)
RDW: 12.8 % (ref 11.5–15.5)
WBC: 9.4 10*3/uL (ref 4.0–10.5)

## 2016-08-07 LAB — COMPREHENSIVE METABOLIC PANEL
ALBUMIN: 3.7 g/dL (ref 3.5–5.2)
ALK PHOS: 88 U/L (ref 39–117)
ALT: 15 U/L (ref 0–35)
AST: 15 U/L (ref 0–37)
BUN: 18 mg/dL (ref 6–23)
CHLORIDE: 99 meq/L (ref 96–112)
CO2: 30 mEq/L (ref 19–32)
Calcium: 9.2 mg/dL (ref 8.4–10.5)
Creatinine, Ser: 0.91 mg/dL (ref 0.40–1.20)
GFR: 70.72 mL/min (ref >60.00)
Glucose, Bld: 154 mg/dL — ABNORMAL HIGH (ref 70–99)
POTASSIUM: 4.6 meq/L (ref 3.5–5.1)
SODIUM: 136 meq/L (ref 135–145)
Total Bilirubin: 0.6 mg/dL (ref 0.2–1.2)
Total Protein: 6.9 g/dL (ref 6.0–8.3)

## 2016-08-07 LAB — HEMOGLOBIN A1C: Hgb A1c MFr Bld: 7.4 % — ABNORMAL HIGH (ref 4.6–6.5)

## 2016-08-07 LAB — LDL CHOLESTEROL, DIRECT: Direct LDL: 44 mg/dL

## 2016-08-07 NOTE — Assessment & Plan Note (Signed)
Encouraged heart healthy diet, increase exercise, avoid trans fats, consider a krill oil cap daily 

## 2016-08-07 NOTE — Patient Instructions (Signed)

## 2016-08-07 NOTE — Assessment & Plan Note (Signed)
Well controlled, no changes to meds. Encouraged heart healthy diet such as the DASH diet and exercise as tolerated.  °

## 2016-08-07 NOTE — Assessment & Plan Note (Signed)
minimize simple carbs. Increase exercise as tolerated. Continue current meds  

## 2016-08-07 NOTE — Progress Notes (Signed)
Patient ID: April Warren, female   DOB: 26-Apr-1971, 45 y.o.   MRN: [id number]   Subjective:    Patient ID: April Warren, female    DOB: 02-28-1971, 45 y.o.   MRN: [id number]  Chief Complaint  Patient presents with  . Hypertension    follow up. fasting. patient have queston about metoprolol, report that she feel exhausted since taking this medication  . Diabetes    follow up.fasting.     HPI Patient is in today for follow up. Blood sugars have been between 110 and 175. No polyuria or polydipsia. Is trying to maintain a heart healthy diet. Denies CP/palp/SOB/HA/congestion/fevers/GI or GU c/o. Taking meds as prescribed  Past Medical History:  Diagnosis Date  . Allergy   . Chicken pox 45 yrs old  . Diabetes mellitus 08-12-12   type 2- dr Zigmund Daniel diagnosed  . Diabetic retinopathy ([place])   . Dysmenorrhea 08/20/2012  . HTN (hypertension) 08/20/2012  . Obesity   . Obesity, unspecified 08/28/2013  . Other and unspecified hyperlipidemia 08/28/2013  . Preventative health care 08/20/2012  . Sinusitis 11/14/2013  . Sinusitis, acute 05/02/2016    Past Surgical History:  Procedure Laterality Date  . REFRACTIVE SURGERY  08-12-12   right, left on 09/16/12    Family History  Problem Relation Age of Onset  . Cancer Mother 22    breast  . Diabetes Mother     type 2  . Hypertension Mother   . Hypertension Father   . Hyperlipidemia Father   . COPD Father   . Hypertension Maternal Grandmother   . Hyperlipidemia Maternal Grandmother   . COPD Maternal Grandmother   . Asthma Maternal Grandmother   . Hypertension Maternal Grandfather   . Heart attack Maternal Grandfather   . Gout Brother   . Cancer Brother     lung  . Heart disease Paternal Grandfather     Social History   Social History  . Marital status: Married    Spouse name: N/A  . Number of children: N/A  . Years of education: N/A   Occupational History  . Not on file.   Social History Main Topics  . Smoking status: Never Smoker   . Smokeless tobacco: Never Used  . Alcohol use Yes     Comment: socially  . Drug use: No  . Sexual activity: Yes    Partners: Male   Other Topics Concern  . Not on file   Social History Narrative  . No narrative on file    Outpatient Medications Prior to Visit  Medication Sig Dispense Refill  . albuterol (PROVENTIL HFA;VENTOLIN HFA) 108 (90 Base) MCG/ACT inhaler Inhale 2 puffs into the lungs every 6 (six) hours as needed for wheezing or shortness of breath. 1 Inhaler 0  . atorvastatin (LIPITOR) 20 MG tablet Take 1 tablet (20 mg total) by mouth daily. 90 tablet 1  . Blood Glucose Monitoring Suppl (ONE TOUCH BASIC SYSTEM) W/DEVICE KIT Test bid prn 1 each 0  . gabapentin (NEURONTIN) 100 MG capsule TAKE 1-3 CAPSULES BY MOUTH AT BEDTIME AS DIRECTED. 90 capsule 0  . glipiZIDE (GLUCOTROL) 5 MG tablet TAKE 1 TABLET (5 MG TOTAL) BY MOUTH 2 (TWO) TIMES DAILY BEFORE A MEAL. 180 tablet 1  . glucose blood (ONE TOUCH ULTRA TEST) test strip TEST TWICE A DAY DX E11.9 100 each 5  . hydrochlorothiazide (HYDRODIURIL) 25 MG tablet TAKE 1 TABLET (25 MG TOTAL) BY MOUTH DAILY. 90 tablet 1  . ibuprofen (ADVIL,MOTRIN) 200 MG tablet  Take 400 mg by mouth every 8 (eight) hours as needed.    Marland Kitchen lisinopril (PRINIVIL,ZESTRIL) 20 MG tablet TAKE 1 TABLET (20 MG TOTAL) BY MOUTH 2 (TWO) TIMES DAILY. 180 tablet 1  . LORazepam (ATIVAN) 0.5 MG tablet TAKE 1 TABLET TWICE A DAY AS NEEDED FOR ANXIETY OR SLEEP 40 tablet 0  . metFORMIN (GLUCOPHAGE) 1000 MG tablet TAKE 1 TABLET (1,000 MG TOTAL) BY MOUTH 2 (TWO) TIMES DAILY WITH A MEAL. 180 tablet 1  . metFORMIN (GLUCOPHAGE) 500 MG tablet TAKE (1) TABLET BY MOUTH ONCE DAILY. 30 tablet 5  . metoprolol (LOPRESSOR) 50 MG tablet Take 1 tablet (50 mg total) by mouth 2 (two) times daily. 60 tablet 3  . montelukast (SINGULAIR) 10 MG tablet Take 1 tablet (10 mg total) by mouth at bedtime. 30 tablet 3  . ONE TOUCH LANCETS MISC Check two daily DX E11.9 200 each 12  . fluconazole  (DIFLUCAN) 150 MG tablet 1 tab po weekly x 2 days (Patient not taking: Reported on 08/07/2016) 2 tablet 1  . HYDROcodone-homatropine (HYCODAN) 5-1.5 MG/5ML syrup Take 5 mLs by mouth every 8 (eight) hours as needed for cough. (Patient not taking: Reported on 08/07/2016) 120 mL 0  . penicillin v potassium (VEETID) 500 MG tablet Take 1 tablet (500 mg total) by mouth 4 (four) times daily. (Patient not taking: Reported on 08/07/2016) 40 tablet 0   No facility-administered medications prior to visit.     No Known Allergies  Review of Systems  Constitutional: Negative for fever and malaise/fatigue.  HENT: Negative for congestion.   Eyes: Negative for blurred vision.  Respiratory: Negative for shortness of breath.   Cardiovascular: Negative for chest pain, palpitations and leg swelling.  Gastrointestinal: Negative for abdominal pain, blood in stool and nausea.  Genitourinary: Negative for dysuria and frequency.  Musculoskeletal: Negative for falls.  Skin: Negative for rash.  Neurological: Negative for dizziness, loss of consciousness and headaches.  Endo/Heme/Allergies: Negative for environmental allergies.  Psychiatric/Behavioral: Negative for depression. The patient is not nervous/anxious.        Objective:    Physical Exam  Constitutional: She is oriented to person, place, and time. She appears well-developed and well-nourished. No distress.  HENT:  Head: Normocephalic and atraumatic.  Nose: Nose normal.  Eyes: Right eye exhibits no discharge. Left eye exhibits no discharge.  Neck: Normal range of motion. Neck supple.  Cardiovascular: Normal rate and regular rhythm.   No murmur heard. Pulmonary/Chest: Effort normal and breath sounds normal.  Abdominal: Soft. Bowel sounds are normal. There is no tenderness.  Musculoskeletal: She exhibits no edema.  Neurological: She is alert and oriented to person, place, and time.  Skin: Skin is warm and dry.  Psychiatric: She has a normal mood and  affect.  Nursing note and vitals reviewed.   BP 140/84 (BP Location: Right Arm, Patient Position: Sitting, Cuff Size: Large)   Pulse 64   Temp 99.1 F (37.3 C) (Oral)   Resp 16   Ht 5' 9"  (1.753 m)   Wt (!) 303 lb 3.2 oz (137.5 kg)   SpO2 97%   BMI 44.77 kg/m  Wt Readings from Last 3 Encounters:  08/07/16 (!) 303 lb 3.2 oz (137.5 kg)  06/06/16 (!) 309 lb 4 oz (140.3 kg)  05/02/16 (!) 303 lb (137.4 kg)     Lab Results  Component Value Date   WBC 8.4 05/02/2016   HGB 12.8 05/02/2016   HCT 38.1 05/02/2016   PLT 347.0 05/02/2016  GLUCOSE 168 (H) 05/02/2016   CHOL 101 05/02/2016   TRIG 169.0 (H) 05/02/2016   HDL 23.30 (L) 05/02/2016   LDLDIRECT 54.0 01/29/2016   LDLCALC 44 05/02/2016   ALT 14 05/02/2016   AST 14 05/02/2016   NA 137 05/02/2016   K 4.8 05/02/2016   CL 101 05/02/2016   CREATININE 0.82 05/02/2016   BUN 16 05/02/2016   CO2 29 05/02/2016   TSH 2.15 05/02/2016   INR 1.03 03/04/2011   HGBA1C 7.1 (H) 05/02/2016   MICROALBUR 19.6 (H) 01/29/2016    Lab Results  Component Value Date   TSH 2.15 05/02/2016   Lab Results  Component Value Date   WBC 8.4 05/02/2016   HGB 12.8 05/02/2016   HCT 38.1 05/02/2016   MCV 89.5 05/02/2016   PLT 347.0 05/02/2016   Lab Results  Component Value Date   NA 137 05/02/2016   K 4.8 05/02/2016   CO2 29 05/02/2016   GLUCOSE 168 (H) 05/02/2016   BUN 16 05/02/2016   CREATININE 0.82 05/02/2016   BILITOT 0.4 05/02/2016   ALKPHOS 90 05/02/2016   AST 14 05/02/2016   ALT 14 05/02/2016   PROT 7.1 05/02/2016   ALBUMIN 3.9 05/02/2016   CALCIUM 9.3 05/02/2016   GFR 79.85 05/02/2016   Lab Results  Component Value Date   CHOL 101 05/02/2016   Lab Results  Component Value Date   HDL 23.30 (L) 05/02/2016   Lab Results  Component Value Date   LDLCALC 44 05/02/2016   Lab Results  Component Value Date   TRIG 169.0 (H) 05/02/2016   Lab Results  Component Value Date   CHOLHDL 4 05/02/2016   Lab Results  Component  Value Date   HGBA1C 7.1 (H) 05/02/2016       Assessment & Plan:   Problem List Items Addressed This Visit    Diabetes mellitus type 2 in obese ([place]) - Primary     minimize simple carbs. Increase exercise as tolerated. Continue current meds      Relevant Orders   Hemoglobin A1c   Obesity    Encouraged DASH diet, decrease po intake and increase exercise as tolerated. Needs 7-8 hours of sleep nightly. Avoid trans fats, eat small, frequent meals every 4-5 hours with lean proteins, complex carbs and healthy fats. Minimize simple carbs      HTN (hypertension)    Well controlled, no changes to meds. Encouraged heart healthy diet such as the DASH diet and exercise as tolerated.       Relevant Orders   TSH   CBC   Comprehensive metabolic panel   Hyperlipemia    Encouraged heart healthy diet, increase exercise, avoid trans fats, consider a krill oil cap daily      Relevant Orders   Lipid panel    Other Visit Diagnoses   None.     I have discontinued Ms. Benninger's HYDROcodone-homatropine, penicillin v potassium, and fluconazole. I am also having her maintain her ibuprofen, ONE TOUCH BASIC SYSTEM, glipiZIDE, glucose blood, hydrochlorothiazide, lisinopril, LORazepam, metFORMIN, ONE TOUCH LANCETS, atorvastatin, montelukast, metFORMIN, albuterol, metoprolol, and gabapentin.  No orders of the defined types were placed in this encounter.    Penni Homans, MD

## 2016-08-07 NOTE — Assessment & Plan Note (Signed)
Encouraged DASH diet, decrease po intake and increase exercise as tolerated. Needs 7-8 hours of sleep nightly. Avoid trans fats, eat small, frequent meals every 4-5 hours with lean proteins, complex carbs and healthy fats. Minimize simple carbs 

## 2016-08-08 ENCOUNTER — Other Ambulatory Visit: Payer: Self-pay | Admitting: Family Medicine

## 2016-08-08 DIAGNOSIS — E785 Hyperlipidemia, unspecified: Secondary | ICD-10-CM

## 2016-08-08 DIAGNOSIS — E1169 Type 2 diabetes mellitus with other specified complication: Secondary | ICD-10-CM

## 2016-08-08 DIAGNOSIS — I10 Essential (primary) hypertension: Secondary | ICD-10-CM

## 2016-08-08 DIAGNOSIS — E669 Obesity, unspecified: Secondary | ICD-10-CM

## 2016-08-08 DIAGNOSIS — Z0000 Encounter for general adult medical examination without abnormal findings: Secondary | ICD-10-CM

## 2016-08-08 MED ORDER — GLIPIZIDE 5 MG PO TABS: ORAL_TABLET | ORAL | 3 refills | Status: DC

## 2016-08-11 ENCOUNTER — Encounter: Payer: Self-pay | Admitting: Family Medicine

## 2016-08-15 ENCOUNTER — Other Ambulatory Visit: Payer: Self-pay | Admitting: Family Medicine

## 2016-08-17 MED ORDER — GABAPENTIN 100 MG PO CAPS: ORAL_CAPSULE | ORAL | 1 refills | Status: DC

## 2016-08-28 ENCOUNTER — Telehealth: Payer: Self-pay | Admitting: Family Medicine

## 2016-08-28 ENCOUNTER — Other Ambulatory Visit: Payer: Self-pay | Admitting: Family Medicine

## 2016-08-28 DIAGNOSIS — E1169 Type 2 diabetes mellitus with other specified complication: Secondary | ICD-10-CM

## 2016-08-28 DIAGNOSIS — E669 Obesity, unspecified: Secondary | ICD-10-CM

## 2016-08-28 DIAGNOSIS — J209 Acute bronchitis, unspecified: Secondary | ICD-10-CM

## 2016-08-28 DIAGNOSIS — E119 Type 2 diabetes mellitus without complications: Secondary | ICD-10-CM

## 2016-08-28 DIAGNOSIS — Z0000 Encounter for general adult medical examination without abnormal findings: Secondary | ICD-10-CM

## 2016-08-28 DIAGNOSIS — I10 Essential (primary) hypertension: Secondary | ICD-10-CM

## 2016-08-28 DIAGNOSIS — E785 Hyperlipidemia, unspecified: Secondary | ICD-10-CM

## 2016-08-28 MED ORDER — LISINOPRIL 20 MG PO TABS: ORAL_TABLET | ORAL | 1 refills | Status: DC

## 2016-08-28 MED ORDER — METOPROLOL TARTRATE 50 MG PO TABS: 50.0000 mg | ORAL_TABLET | Freq: Two times a day (BID) | ORAL | 3 refills | Status: DC

## 2016-08-28 MED ORDER — HYDROCHLOROTHIAZIDE 25 MG PO TABS: ORAL_TABLET | ORAL | 1 refills | Status: DC

## 2016-08-28 NOTE — Telephone Encounter (Signed)
Caller name: JAMES Relation to ZO:XWRUEApt:SPOUSE Call back number:[phone] Pharmacy:[facility]  Reason for call: PT IS NEEDING RX metoprolol (LOPRESSOR) 50 MG tablet  AND  hydrochlorothiazide (HYDRODIURIL) 25 MG tablet  STATES THEY CHANGED PHARMACY AND THEY STATE SHE DOES NOT HAVE REFILLS, HOWEVER THE OLD PHARMACY STATES SHE HAS REFILLS.

## 2016-08-28 NOTE — Telephone Encounter (Signed)
Sent in as requested 

## 2016-09-09 ENCOUNTER — Other Ambulatory Visit: Payer: Self-pay | Admitting: Family Medicine

## 2016-09-09 DIAGNOSIS — E785 Hyperlipidemia, unspecified: Secondary | ICD-10-CM

## 2016-09-09 DIAGNOSIS — I10 Essential (primary) hypertension: Secondary | ICD-10-CM

## 2016-09-09 DIAGNOSIS — E669 Obesity, unspecified: Secondary | ICD-10-CM

## 2016-09-09 DIAGNOSIS — E1169 Type 2 diabetes mellitus with other specified complication: Secondary | ICD-10-CM

## 2016-09-09 DIAGNOSIS — Z0000 Encounter for general adult medical examination without abnormal findings: Secondary | ICD-10-CM

## 2016-09-09 MED ORDER — ATORVASTATIN CALCIUM 20 MG PO TABS: 20.0000 mg | ORAL_TABLET | Freq: Every day | ORAL | 1 refills | Status: DC

## 2016-09-18 ENCOUNTER — Other Ambulatory Visit: Payer: Self-pay | Admitting: Family Medicine

## 2016-11-03 ENCOUNTER — Other Ambulatory Visit: Payer: Self-pay | Admitting: Family Medicine

## 2016-11-03 DIAGNOSIS — E669 Obesity, unspecified: Secondary | ICD-10-CM

## 2016-11-03 DIAGNOSIS — Z0000 Encounter for general adult medical examination without abnormal findings: Secondary | ICD-10-CM

## 2016-11-03 DIAGNOSIS — E1169 Type 2 diabetes mellitus with other specified complication: Secondary | ICD-10-CM

## 2016-11-03 DIAGNOSIS — I10 Essential (primary) hypertension: Secondary | ICD-10-CM

## 2016-11-03 MED ORDER — GLIPIZIDE 5 MG PO TABS: ORAL_TABLET | ORAL | 0 refills | Status: DC

## 2016-11-10 ENCOUNTER — Ambulatory Visit (INDEPENDENT_AMBULATORY_CARE_PROVIDER_SITE_OTHER): Payer: BLUE CROSS/BLUE SHIELD | Admitting: Family Medicine

## 2016-11-10 ENCOUNTER — Encounter: Payer: Self-pay | Admitting: Family Medicine

## 2016-11-10 VITALS — BP 122/80 | HR 68 | Temp 98.4°F | Ht 69.0 in | Wt 305.4 lb

## 2016-11-10 DIAGNOSIS — E11319 Type 2 diabetes mellitus with unspecified diabetic retinopathy without macular edema: Secondary | ICD-10-CM

## 2016-11-10 DIAGNOSIS — E1169 Type 2 diabetes mellitus with other specified complication: Secondary | ICD-10-CM

## 2016-11-10 DIAGNOSIS — E669 Obesity, unspecified: Secondary | ICD-10-CM | POA: Diagnosis not present

## 2016-11-10 DIAGNOSIS — Z0000 Encounter for general adult medical examination without abnormal findings: Secondary | ICD-10-CM

## 2016-11-10 DIAGNOSIS — E782 Mixed hyperlipidemia: Secondary | ICD-10-CM | POA: Diagnosis not present

## 2016-11-10 DIAGNOSIS — I10 Essential (primary) hypertension: Secondary | ICD-10-CM | POA: Diagnosis not present

## 2016-11-10 LAB — LIPID PANEL
Cholesterol: 111 mg/dL (ref 0–200)
HDL: 26.2 mg/dL — ABNORMAL LOW (ref >39.00)
LDL Cholesterol: 47 mg/dL (ref 0–99)
NONHDL: 84.61
Total CHOL/HDL Ratio: 4
Triglycerides: 186 mg/dL — ABNORMAL HIGH (ref 0.0–149.0)
VLDL: 37.2 mg/dL (ref 0.0–40.0)

## 2016-11-10 LAB — CBC
HCT: 37.6 % (ref 36.0–46.0)
Hemoglobin: 12.5 g/dL (ref 12.0–15.0)
MCHC: 33.2 g/dL (ref 30.0–36.0)
MCV: 88.6 fl (ref 78.0–100.0)
Platelets: 321 10*3/uL (ref 150.0–400.0)
RBC: 4.24 Mil/uL (ref 3.87–5.11)
RDW: 12.7 % (ref 11.5–15.5)
WBC: 8.1 10*3/uL (ref 4.0–10.5)

## 2016-11-10 LAB — COMPREHENSIVE METABOLIC PANEL
ALK PHOS: 97 U/L (ref 39–117)
ALT: 13 U/L (ref 0–35)
AST: 12 U/L (ref 0–37)
Albumin: 3.6 g/dL (ref 3.5–5.2)
BILIRUBIN TOTAL: 0.4 mg/dL (ref 0.2–1.2)
BUN: 15 mg/dL (ref 6–23)
CO2: 28 mEq/L (ref 19–32)
Calcium: 9.1 mg/dL (ref 8.4–10.5)
Chloride: 101 mEq/L (ref 96–112)
Creatinine, Ser: 0.78 mg/dL (ref 0.40–1.20)
GFR: 84.4 mL/min (ref >60.00)
GLUCOSE: 187 mg/dL — AB (ref 70–99)
Potassium: 4.8 mEq/L (ref 3.5–5.1)
SODIUM: 135 meq/L (ref 135–145)
TOTAL PROTEIN: 6.8 g/dL (ref 6.0–8.3)

## 2016-11-10 LAB — HEMOGLOBIN A1C: HEMOGLOBIN A1C: 7.5 % — AB (ref 4.6–6.5)

## 2016-11-10 LAB — TSH: TSH: 1.45 u[IU]/mL (ref 0.35–4.50)

## 2016-11-10 MED ORDER — LORAZEPAM 0.5 MG PO TABS: ORAL_TABLET | ORAL | 0 refills | Status: DC

## 2016-11-10 MED ORDER — GABAPENTIN 100 MG PO CAPS: ORAL_CAPSULE | ORAL | 5 refills | Status: DC

## 2016-11-10 NOTE — Progress Notes (Signed)
Pre visit review using our clinic review tool, if applicable. No additional management support is needed unless otherwise documented below in the visit note. 

## 2016-11-10 NOTE — Assessment & Plan Note (Signed)
[facility]

## 2016-11-10 NOTE — Assessment & Plan Note (Signed)
hgba1c unacceptable, minimize simple carbs. Increase exercise as tolerated. Continue current meds. Repeat hgba1c today 

## 2016-11-10 NOTE — Assessment & Plan Note (Addendum)
Well controlled, no changes to meds. Encouraged heart healthy diet such as the DASH diet and exercise as tolerated. Tolerating Metoprolol now.

## 2016-11-10 NOTE — Progress Notes (Signed)
Patient ID: April Warren, female   DOB: [birth year], 45 y.o.   MRN: [id number]   Subjective:    Patient ID: April Warren, female    DOB: 1971-10-04, 45 y.o.   MRN: [id number]  Chief Complaint  Patient presents with  . Annual Exam    HPI Patient is in today for annual comprehensive physical exam and follow up on several medical conditions. She denies any recent hospitalizations or acute febrile illness. Does endorse fatigue and malaise. Denies polyuria or polydipsia. Her lowest sugar in the past 2 weeks was 104 and highest was 111. She is trying to minimize carbohydrates. Has noted some nasal congestion. Denies CP/palp/SOB/HA/congestion/fevers/GI or GU c/o. Taking meds as prescribed  Past Medical History:  Diagnosis Date  . Allergy   . Chicken pox 45 yrs old  . Diabetes mellitus 08-12-12   type 2- dr Zigmund Daniel diagnosed  . Diabetic retinopathy ([place])   . Dysmenorrhea 08/20/2012  . HTN (hypertension) 08/20/2012  . Hyperlipidemia, mixed 08/28/2013  . Obesity   . Obesity, unspecified 08/28/2013  . Other and unspecified hyperlipidemia 08/28/2013  . Preventative health care 08/20/2012  . Sinusitis 11/14/2013  . Sinusitis, acute 05/02/2016    Past Surgical History:  Procedure Laterality Date  . REFRACTIVE SURGERY  08-12-12   right, left on 09/16/12    Family History  Problem Relation Age of Onset  . Cancer Mother 37    breast  . Diabetes Mother     type 2  . Hypertension Mother   . Hypertension Father   . Hyperlipidemia Father   . COPD Father   . Hypertension Maternal Grandmother   . Hyperlipidemia Maternal Grandmother   . COPD Maternal Grandmother   . Asthma Maternal Grandmother   . Hypertension Maternal Grandfather   . Heart attack Maternal Grandfather   . Gout Brother   . Cancer Brother     lung  . Heart disease Paternal Grandfather     Social History   Social History  . Marital status: Married    Spouse name: N/A  . Number of children: N/A  . Years of education: N/A    Occupational History  . Not on file.   Social History Main Topics  . Smoking status: Never Smoker  . Smokeless tobacco: Never Used  . Alcohol use Yes     Comment: socially  . Drug use: No  . Sexual activity: Yes    Partners: Male   Other Topics Concern  . Not on file   Social History Narrative   Lives with husband.    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    Not exercising    Outpatient Medications Prior to Visit  Medication Sig Dispense Refill  . albuterol (PROVENTIL HFA;VENTOLIN HFA) 108 (90 Base) MCG/ACT inhaler Inhale 2 puffs into the lungs every 6 (six) hours as needed for wheezing or shortness of breath. 1 Inhaler 0  . atorvastatin (LIPITOR) 20 MG tablet Take 1 tablet (20 mg total) by mouth daily. 90 tablet 1  . Blood Glucose Monitoring Suppl (ONE TOUCH BASIC SYSTEM) W/DEVICE KIT Test bid prn 1 each 0  . glipiZIDE (GLUCOTROL) 5 MG tablet Take 2 tablets in the morning and take 1 tablet in the evening. 90 tablet 0  . glucose blood (ONE TOUCH ULTRA TEST) test strip TEST TWICE A DAY DX E11.9 100 each 5  . hydrochlorothiazide (HYDRODIURIL) 25 MG tablet  TAKE 1 TABLET (25 MG TOTAL) BY MOUTH DAILY. 90 tablet 1  . ibuprofen (ADVIL,MOTRIN) 200 MG tablet Take 400 mg by mouth every 8 (eight) hours as needed.    Marland Kitchen lisinopril (PRINIVIL,ZESTRIL) 20 MG tablet TAKE 1 TABLET (20 MG TOTAL) BY MOUTH 2 (TWO) TIMES DAILY. 180 tablet 1  . metFORMIN (GLUCOPHAGE) 1000 MG tablet TAKE 1 TABLET (1,000 MG TOTAL) BY MOUTH 2 (TWO) TIMES DAILY WITH A MEAL. 180 tablet 1  . metoprolol (LOPRESSOR) 50 MG tablet Take 1 tablet (50 mg total) by mouth 2 (two) times daily. 60 tablet 3  . montelukast (SINGULAIR) 10 MG tablet Take 1 tablet (10 mg total) by mouth at bedtime. 30 tablet 3  . ONE TOUCH LANCETS MISC Check two daily DX E11.9 200 each 12  . gabapentin (NEURONTIN) 100 MG capsule TAKE ONE TO THREE CAPSULES  BY MOUTH AT bedtime as instructed 90 capsule 5  . LORazepam (ATIVAN) 0.5 MG tablet TAKE 1 TABLET TWICE A DAY AS NEEDED FOR ANXIETY OR SLEEP 40 tablet 0  . metFORMIN (GLUCOPHAGE) 500 MG tablet TAKE (1) TABLET BY MOUTH ONCE DAILY. 30 tablet 5   No facility-administered medications prior to visit.     No Known Allergies  Review of Systems  Constitutional: Positive for malaise/fatigue. Negative for fever.  HENT: Negative for congestion.   Eyes: Negative for blurred vision.  Respiratory: Negative for shortness of breath.   Cardiovascular: Negative for chest pain, palpitations and leg swelling.  Gastrointestinal: Negative for abdominal pain, blood in stool and nausea.  Genitourinary: Negative for dysuria and frequency.  Musculoskeletal: Negative for falls.  Skin: Negative for rash.  Neurological: Negative for dizziness, loss of consciousness and headaches.  Endo/Heme/Allergies: Negative for environmental allergies.  Psychiatric/Behavioral: Negative for depression. The patient is not nervous/anxious.        Objective:    Physical Exam  Constitutional: She is oriented to person, place, and time. She appears well-developed and well-nourished. No distress.  HENT:  Head: Normocephalic and atraumatic.  Eyes: Conjunctivae are normal.  Neck: Neck supple. No thyromegaly present.  Cardiovascular: Normal rate, regular rhythm and normal heart sounds.   No murmur heard. Pulmonary/Chest: Effort normal and breath sounds normal. No respiratory distress.  Abdominal: Soft. Bowel sounds are normal. She exhibits no distension and no mass. There is no tenderness.  Musculoskeletal: She exhibits no edema.  Lymphadenopathy:    She has no cervical adenopathy.  Neurological: She is alert and oriented to person, place, and time.  Skin: Skin is warm and dry.  Psychiatric: She has a normal mood and affect. Her behavior is normal.    BP 122/80 (BP Location: Right Arm, Patient Position: Sitting, Cuff Size:  Large)   Pulse 68   Temp 98.4 F (36.9 C) (Oral)   Ht 5' 9"  (1.753 m)   Wt (!) 305 lb 6 oz (138.5 kg)   SpO2 97%   BMI 45.10 kg/m  Wt Readings from Last 3 Encounters:  11/10/16 (!) 305 lb 6 oz (138.5 kg)  08/07/16 (!) 303 lb 3.2 oz (137.5 kg)  06/06/16 (!) 309 lb 4 oz (140.3 kg)     Lab Results  Component Value Date   WBC 9.4 08/07/2016   HGB 12.8 08/07/2016   HCT 38.1 08/07/2016   PLT 309.0 08/07/2016   GLUCOSE 154 (H) 08/07/2016   CHOL 96 08/07/2016   TRIG 241.0 (H) 08/07/2016   HDL 22.30 (L) 08/07/2016   LDLDIRECT 44.0 08/07/2016   LDLCALC 44 05/02/2016  ALT 15 08/07/2016   AST 15 08/07/2016   NA 136 08/07/2016   K 4.6 08/07/2016   CL 99 08/07/2016   CREATININE 0.91 08/07/2016   BUN 18 08/07/2016   CO2 30 08/07/2016   TSH 1.36 08/07/2016   INR 1.03 03/04/2011   HGBA1C 7.4 (H) 08/07/2016   MICROALBUR 19.6 (H) 01/29/2016    Lab Results  Component Value Date   TSH 1.36 08/07/2016   Lab Results  Component Value Date   WBC 9.4 08/07/2016   HGB 12.8 08/07/2016   HCT 38.1 08/07/2016   MCV 88.0 08/07/2016   PLT 309.0 08/07/2016   Lab Results  Component Value Date   NA 136 08/07/2016   K 4.6 08/07/2016   CO2 30 08/07/2016   GLUCOSE 154 (H) 08/07/2016   BUN 18 08/07/2016   CREATININE 0.91 08/07/2016   BILITOT 0.6 08/07/2016   ALKPHOS 88 08/07/2016   AST 15 08/07/2016   ALT 15 08/07/2016   PROT 6.9 08/07/2016   ALBUMIN 3.7 08/07/2016   CALCIUM 9.2 08/07/2016   GFR 70.72 08/07/2016   Lab Results  Component Value Date   CHOL 96 08/07/2016   Lab Results  Component Value Date   HDL 22.30 (L) 08/07/2016   Lab Results  Component Value Date   LDLCALC 44 05/02/2016   Lab Results  Component Value Date   TRIG 241.0 (H) 08/07/2016   Lab Results  Component Value Date   CHOLHDL 4 08/07/2016   Lab Results  Component Value Date   HGBA1C 7.4 (H) 08/07/2016       Assessment & Plan:   Problem List Items Addressed This Visit    Diabetes  mellitus type 2 in obese ([place])    hgba1c unacceptable, minimize simple carbs. Increase exercise as tolerated. Continue current meds. Repeat hgba1c today      Relevant Medications   LORazepam (ATIVAN) 0.5 MG tablet   Other Relevant Orders   Hemoglobin A1c   Diabetic retinopathy ([place])    Follows with [facility]       HTN (hypertension)    Well controlled, no changes to meds. Encouraged heart healthy diet such as the DASH diet and exercise as tolerated. Tolerating Metoprolol now.       Relevant Medications   LORazepam (ATIVAN) 0.5 MG tablet   Other Relevant Orders   Comprehensive metabolic panel   TSH   CBC   Preventative health care    Patient encouraged to maintain heart healthy diet, regular exercise, adequate sleep. Consider daily probiotics. Take medications as prescribed. Given and reviewed copy of ACP documents from [employer] and encouraged to complete and return      Relevant Medications   LORazepam (ATIVAN) 0.5 MG tablet   Hyperlipidemia, mixed    Tolerating statin, encouraged heart healthy diet, avoid trans fats, minimize simple carbs and saturated fats. Increase exercise as tolerated      Relevant Orders   Lipid panel      I am having Ms. Roxy Manns maintain her ibuprofen, ONE TOUCH BASIC SYSTEM, glucose blood, metFORMIN, ONE TOUCH LANCETS, montelukast, albuterol, hydrochlorothiazide, metoprolol, lisinopril, atorvastatin, glipiZIDE, gabapentin, and LORazepam.  Meds ordered this encounter  Medications  . gabapentin (NEURONTIN) 100 MG capsule    Sig: TAKE ONE TO THREE CAPSULES BY MOUTH AT bedtime as instructed    Dispense:  90 capsule    Refill:  5    Rx recently filled, will put refills on hold.  THANKS!!  . LORazepam (  ATIVAN) 0.5 MG tablet    Sig: TAKE 1 TABLET TWICE A DAY AS NEEDED FOR ANXIETY OR SLEEP    Dispense:  40 tablet    Refill:  0    This request is for a new prescription for a controlled substance as required by  Federal/State law.     Penni Homans, MD

## 2016-11-10 NOTE — Assessment & Plan Note (Signed)
Patient encouraged to maintain heart healthy diet, regular exercise, adequate sleep. Consider daily probiotics. Take medications as prescribed. Given and reviewed copy of ACP documents from [place] Secretary of State and encouraged to complete and return 

## 2016-11-10 NOTE — Patient Instructions (Addendum)
Encouraged increased hydration and fiber in diet. Daily probiotics. If bowels not moving can use MOM 2 tbls po in 4 oz of warm prune juice by mouth every 2-3 days. If no results then repeat in 4 hours with  Dulcolax suppository pr, may repeat again in 4 more hours as needed. Seek care if symptoms worsen. Consider daily Miralax and/or Dulcolax if symptoms persist.   Preventive Care for Adults, Female A healthy lifestyle and preventive care can promote health and wellness. Preventive health guidelines for women include the following key practices.  A routine yearly physical is a good way to check with your health care provider about your health and preventive screening. It is a chance to share any concerns and updates on your health and to receive a thorough exam.  Visit your dentist for a routine exam and preventive care every 6 months. Brush your teeth twice a day and floss once a day. Good oral hygiene prevents tooth decay and gum disease.  The frequency of eye exams is based on your age, health, family medical history, use of contact lenses, and other factors. Follow your health care provider's recommendations for frequency of eye exams.  Eat a healthy diet. Foods like vegetables, fruits, whole grains, low-fat dairy products, and lean protein foods contain the nutrients you need without too many calories. Decrease your intake of foods high in solid fats, added sugars, and salt. Eat the right amount of calories for you.Get information about a proper diet from your health care provider, if necessary.  Regular physical exercise is one of the most important things you can do for your health. Most adults should get at least 150 minutes of moderate-intensity exercise (any activity that increases your heart rate and causes you to sweat) each week. In addition, most adults need muscle-strengthening exercises on 2 or more days a week.  Maintain a healthy weight. The body mass index (BMI) is a screening tool  to identify possible weight problems. It provides an estimate of body fat based on height and weight. Your health care provider can find your BMI and can help you achieve or maintain a healthy weight.For adults 20 years and older:  A BMI below 18.5 is considered underweight.  A BMI of 18.5 to 24.9 is normal.  A BMI of 25 to 29.9 is considered overweight.  A BMI of 30 and above is considered obese.  Maintain normal blood lipids and cholesterol levels by exercising and minimizing your intake of saturated fat. Eat a balanced diet with plenty of fruit and vegetables. Blood tests for lipids and cholesterol should begin at age 63 and be repeated every 5 years. If your lipid or cholesterol levels are high, you are over 50, or you are at high risk for heart disease, you may need your cholesterol levels checked more frequently.Ongoing high lipid and cholesterol levels should be treated with medicines if diet and exercise are not working.  If you smoke, find out from your health care provider how to quit. If you do not use tobacco, do not start.  Lung cancer screening is recommended for adults aged 61-80 years who are at high risk for developing lung cancer because of a history of smoking. A yearly low-dose CT scan of the lungs is recommended for people who have at least a 30-pack-year history of smoking and are a current smoker or have quit within the past 15 years. A pack year of smoking is smoking an average of 1 pack of cigarettes a day  for 1 year (for example: 1 pack a day for 30 years or 2 packs a day for 15 years). Yearly screening should continue until the smoker has stopped smoking for at least 15 years. Yearly screening should be stopped for people who develop a health problem that would prevent them from having lung cancer treatment.  If you are pregnant, do not drink alcohol. If you are breastfeeding, be very cautious about drinking alcohol. If you are not pregnant and choose to drink alcohol, do  not have more than 1 drink per day. One drink is considered to be 12 ounces (355 mL) of beer, 5 ounces (148 mL) of wine, or 1.5 ounces (44 mL) of liquor.  Avoid use of street drugs. Do not share needles with anyone. Ask for help if you need support or instructions about stopping the use of drugs.  High blood pressure causes heart disease and increases the risk of stroke. Your blood pressure should be checked at least every 1 to 2 years. Ongoing high blood pressure should be treated with medicines if weight loss and exercise do not work.  If you are 7-62 years old, ask your health care provider if you should take aspirin to prevent strokes.  Diabetes screening is done by taking a blood sample to check your blood glucose level after you have not eaten for a certain period of time (fasting). If you are not overweight and you do not have risk factors for diabetes, you should be screened once every 3 years starting at age 23. If you are overweight or obese and you are 23-18 years of age, you should be screened for diabetes every year as part of your cardiovascular risk assessment.  Breast cancer screening is essential preventive care for women. You should practice "breast self-awareness." This means understanding the normal appearance and feel of your breasts and may include breast self-examination. Any changes detected, no matter how small, should be reported to a health care provider. Women in their 37s and 30s should have a clinical breast exam (CBE) by a health care provider as part of a regular health exam every 1 to 3 years. After age 64, women should have a CBE every year. Starting at age 9, women should consider having a mammogram (breast X-ray test) every year. Women who have a family history of breast cancer should talk to their health care provider about genetic screening. Women at a high risk of breast cancer should talk to their health care providers about having an MRI and a mammogram every  year.  Breast cancer gene (BRCA)-related cancer risk assessment is recommended for women who have family members with BRCA-related cancers. BRCA-related cancers include breast, ovarian, tubal, and peritoneal cancers. Having family members with these cancers may be associated with an increased risk for harmful changes (mutations) in the breast cancer genes BRCA1 and BRCA2. Results of the assessment will determine the need for genetic counseling and BRCA1 and BRCA2 testing.  Your health care provider may recommend that you be screened regularly for cancer of the pelvic organs (ovaries, uterus, and vagina). This screening involves a pelvic examination, including checking for microscopic changes to the surface of your cervix (Pap test). You may be encouraged to have this screening done every 3 years, beginning at age 43.  For women ages 55-65, health care providers may recommend pelvic exams and Pap testing every 3 years, or they may recommend the Pap and pelvic exam, combined with testing for human papilloma virus (HPV), every 5  years. Some types of HPV increase your risk of cervical cancer. Testing for HPV may also be done on women of any age with unclear Pap test results.  Other health care providers may not recommend any screening for nonpregnant women who are considered low risk for pelvic cancer and who do not have symptoms. Ask your health care provider if a screening pelvic exam is right for you.  If you have had past treatment for cervical cancer or a condition that could lead to cancer, you need Pap tests and screening for cancer for at least 20 years after your treatment. If Pap tests have been discontinued, your risk factors (such as having a new sexual partner) need to be reassessed to determine if screening should resume. Some women have medical problems that increase the chance of getting cervical cancer. In these cases, your health care provider may recommend more frequent screening and Pap  tests.  Colorectal cancer can be detected and often prevented. Most routine colorectal cancer screening begins at the age of 20 years and continues through age 25 years. However, your health care provider may recommend screening at an earlier age if you have risk factors for colon cancer. On a yearly basis, your health care provider may provide home test kits to check for hidden blood in the stool. Use of a small camera at the end of a tube, to directly examine the colon (sigmoidoscopy or colonoscopy), can detect the earliest forms of colorectal cancer. Talk to your health care provider about this at age 46, when routine screening begins. Direct exam of the colon should be repeated every 5-10 years through age 21 years, unless early forms of precancerous polyps or small growths are found.  People who are at an increased risk for hepatitis B should be screened for this virus. You are considered at high risk for hepatitis B if:  You were born in a country where hepatitis B occurs often. Talk with your health care provider about which countries are considered high risk.  Your parents were born in a high-risk country and you have not received a shot to protect against hepatitis B (hepatitis B vaccine).  You have HIV or AIDS.  You use needles to inject street drugs.  You live with, or have sex with, someone who has hepatitis B.  You get hemodialysis treatment.  You take certain medicines for conditions like cancer, organ transplantation, and autoimmune conditions.  Hepatitis C blood testing is recommended for all people born from [birth year] through 1965 and any individual with known risks for hepatitis C.  Practice safe sex. Use condoms and avoid high-risk sexual practices to reduce the spread of sexually transmitted infections (STIs). STIs include gonorrhea, chlamydia, syphilis, trichomonas, herpes, HPV, and human immunodeficiency virus (HIV). Herpes, HIV, and HPV are viral illnesses that have no cure.  They can result in disability, cancer, and death.  You should be screened for sexually transmitted illnesses (STIs) including gonorrhea and chlamydia if:  You are sexually active and are younger than 24 years.  You are older than 24 years and your health care provider tells you that you are at risk for this type of infection.  Your sexual activity has changed since you were last screened and you are at an increased risk for chlamydia or gonorrhea. Ask your health care provider if you are at risk.  If you are at risk of being infected with HIV, it is recommended that you take a prescription medicine daily to prevent HIV infection.  This is called preexposure prophylaxis (PrEP). You are considered at risk if:  You are sexually active and do not regularly use condoms or know the HIV status of your partner(s).  You take drugs by injection.  You are sexually active with a partner who has HIV.  Talk with your health care provider about whether you are at high risk of being infected with HIV. If you choose to begin PrEP, you should first be tested for HIV. You should then be tested every 3 months for as long as you are taking PrEP.  Osteoporosis is a disease in which the bones lose minerals and strength with aging. This can result in serious bone fractures or breaks. The risk of osteoporosis can be identified using a bone density scan. Women ages 67 years and over and women at risk for fractures or osteoporosis should discuss screening with their health care providers. Ask your health care provider whether you should take a calcium supplement or vitamin D to reduce the rate of osteoporosis.  Menopause can be associated with physical symptoms and risks. Hormone replacement therapy is available to decrease symptoms and risks. You should talk to your health care provider about whether hormone replacement therapy is right for you.  Use sunscreen. Apply sunscreen liberally and repeatedly throughout the  day. You should seek shade when your shadow is shorter than you. Protect yourself by wearing long sleeves, pants, a wide-brimmed hat, and sunglasses year round, whenever you are outdoors.  Once a month, do a whole body skin exam, using a mirror to look at the skin on your back. Tell your health care provider of new moles, moles that have irregular borders, moles that are larger than a pencil eraser, or moles that have changed in shape or color.  Stay current with required vaccines (immunizations).  Influenza vaccine. All adults should be immunized every year.  Tetanus, diphtheria, and acellular pertussis (Td, Tdap) vaccine. Pregnant women should receive 1 dose of Tdap vaccine during each pregnancy. The dose should be obtained regardless of the length of time since the last dose. Immunization is preferred during the 27th-36th week of gestation. An adult who has not previously received Tdap or who does not know her vaccine status should receive 1 dose of Tdap. This initial dose should be followed by tetanus and diphtheria toxoids (Td) booster doses every 10 years. Adults with an unknown or incomplete history of completing a 3-dose immunization series with Td-containing vaccines should begin or complete a primary immunization series including a Tdap dose. Adults should receive a Td booster every 10 years.  Varicella vaccine. An adult without evidence of immunity to varicella should receive 2 doses or a second dose if she has previously received 1 dose. Pregnant females who do not have evidence of immunity should receive the first dose after pregnancy. This first dose should be obtained before leaving the health care facility. The second dose should be obtained 4-8 weeks after the first dose.  Human papillomavirus (HPV) vaccine. Females aged 13-26 years who have not received the vaccine previously should obtain the 3-dose series. The vaccine is not recommended for use in pregnant females. However, pregnancy  testing is not needed before receiving a dose. If a female is found to be pregnant after receiving a dose, no treatment is needed. In that case, the remaining doses should be delayed until after the pregnancy. Immunization is recommended for any person with an immunocompromised condition through the age of 64 years if she did not get  any or all doses earlier. During the 3-dose series, the second dose should be obtained 4-8 weeks after the first dose. The third dose should be obtained 24 weeks after the first dose and 16 weeks after the second dose.  Zoster vaccine. One dose is recommended for adults aged 60 years or older unless certain conditions are present.  Measles, mumps, and rubella (MMR) vaccine. Adults born before [birth year] generally are considered immune to measles and mumps. Adults born in [birth year] or later should have 1 or more doses of MMR vaccine unless there is a contraindication to the vaccine or there is laboratory evidence of immunity to each of the three diseases. A routine second dose of MMR vaccine should be obtained at least 28 days after the first dose for students attending postsecondary schools, health care workers, or international travelers. People who received inactivated measles vaccine or an unknown type of measles vaccine during 1963-1967 should receive 2 doses of MMR vaccine. People who received inactivated mumps vaccine or an unknown type of mumps vaccine before 1979 and are at high risk for mumps infection should consider immunization with 2 doses of MMR vaccine. For females of childbearing age, rubella immunity should be determined. If there is no evidence of immunity, females who are not pregnant should be vaccinated. If there is no evidence of immunity, females who are pregnant should delay immunization until after pregnancy. Unvaccinated health care workers born before [birth year] who lack laboratory evidence of measles, mumps, or rubella immunity or laboratory confirmation of disease should  consider measles and mumps immunization with 2 doses of MMR vaccine or rubella immunization with 1 dose of MMR vaccine.  Pneumococcal 13-valent conjugate (PCV13) vaccine. When indicated, a person who is uncertain of his immunization history and has no record of immunization should receive the PCV13 vaccine. All adults 58 years of age and older should receive this vaccine. An adult aged 25 years or older who has certain medical conditions and has not been previously immunized should receive 1 dose of PCV13 vaccine. This PCV13 should be followed with a dose of pneumococcal polysaccharide (PPSV23) vaccine. Adults who are at high risk for pneumococcal disease should obtain the PPSV23 vaccine at least 8 weeks after the dose of PCV13 vaccine. Adults older than 46 years of age who have normal immune system function should obtain the PPSV23 vaccine dose at least 1 year after the dose of PCV13 vaccine.  Pneumococcal polysaccharide (PPSV23) vaccine. When PCV13 is also indicated, PCV13 should be obtained first. All adults aged 44 years and older should be immunized. An adult younger than age 67 years who has certain medical conditions should be immunized. Any person who resides in a nursing home or long-term care facility should be immunized. An adult smoker should be immunized. People with an immunocompromised condition and certain other conditions should receive both PCV13 and PPSV23 vaccines. People with human immunodeficiency virus (HIV) infection should be immunized as soon as possible after diagnosis. Immunization during chemotherapy or radiation therapy should be avoided. Routine use of PPSV23 vaccine is not recommended for American Indians, [place]atives, or people younger than 65 years unless there are medical conditions that require PPSV23 vaccine. When indicated, people who have unknown immunization and have no record of immunization should receive PPSV23 vaccine. One-time revaccination 5 years after the first  dose of PPSV23 is recommended for people aged 19-64 years who have chronic kidney failure, nephrotic syndrome, asplenia, or immunocompromised conditions. People who received 1-2 doses of PPSV23 before age 50  years should receive another dose of PPSV23 vaccine at age 36 years or later if at least 5 years have passed since the previous dose. Doses of PPSV23 are not needed for people immunized with PPSV23 at or after age 45 years.  Meningococcal vaccine. Adults with asplenia or persistent complement component deficiencies should receive 2 doses of quadrivalent meningococcal conjugate (MenACWY-D) vaccine. The doses should be obtained at least 2 months apart. Microbiologists working with certain meningococcal bacteria, [employer], people at risk during an outbreak, and people who travel to or live in countries with a high rate of meningitis should be immunized. A first-year college student up through age 36 years who is living in a residence hall should receive a dose if she did not receive a dose on or after her 16th birthday. Adults who have certain high-risk conditions should receive one or more doses of vaccine.  Hepatitis A vaccine. Adults who wish to be protected from this disease, have certain high-risk conditions, work with hepatitis A-infected animals, work in hepatitis A research labs, or travel to or work in countries with a high rate of hepatitis A should be immunized. Adults who were previously unvaccinated and who anticipate close contact with an international adoptee during the first 60 days after arrival in the [place] from a country with a high rate of hepatitis A should be immunized.  Hepatitis B vaccine. Adults who wish to be protected from this disease, have certain high-risk conditions, may be exposed to blood or other infectious body fluids, are household contacts or sex partners of hepatitis B positive people, are clients or workers in certain care facilities, or travel to or  work in countries with a high rate of hepatitis B should be immunized.  Haemophilus influenzae type b (Hib) vaccine. A previously unvaccinated person with asplenia or sickle cell disease or having a scheduled splenectomy should receive 1 dose of Hib vaccine. Regardless of previous immunization, a recipient of a hematopoietic stem cell transplant should receive a 3-dose series 6-12 months after her successful transplant. Hib vaccine is not recommended for adults with HIV infection. Preventive Services / Frequency Ages 40 to 79 years  Blood pressure check.** / Every 3-5 years.  Lipid and cholesterol check.** / Every 5 years beginning at age 7.  Clinical breast exam.** / Every 3 years for women in their 32s and 20s.  BRCA-related cancer risk assessment.** / For women who have family members with a BRCA-related cancer (breast, ovarian, tubal, or peritoneal cancers).  Pap test.** / Every 2 years from ages 61 through 68. Every 3 years starting at age 46 through age 36 or 57 with a history of 3 consecutive normal Pap tests.  HPV screening.** / Every 3 years from ages 31 through ages 58 to 25 with a history of 3 consecutive normal Pap tests.  Hepatitis C blood test.** / For any individual with known risks for hepatitis C.  Skin self-exam. / Monthly.  Influenza vaccine. / Every year.  Tetanus, diphtheria, and acellular pertussis (Tdap, Td) vaccine.** / Consult your health care provider. Pregnant women should receive 1 dose of Tdap vaccine during each pregnancy. 1 dose of Td every 10 years.  Varicella vaccine.** / Consult your health care provider. Pregnant females who do not have evidence of immunity should receive the first dose after pregnancy.  HPV vaccine. / 3 doses over 6 months, if 39 and younger. The vaccine is not recommended for use in pregnant females. However, pregnancy testing is not needed before  receiving a dose.  Measles, mumps, rubella (MMR) vaccine.** / You need at least 1 dose  of MMR if you were born in 1957 or later. You may also need a 2nd dose. For females of childbearing age, rubella immunity should be determined. If there is no evidence of immunity, females who are not pregnant should be vaccinated. If there is no evidence of immunity, females who are pregnant should delay immunization until after pregnancy.  Pneumococcal 13-valent conjugate (PCV13) vaccine.** / Consult your health care provider.  Pneumococcal polysaccharide (PPSV23) vaccine.** / 1 to 2 doses if you smoke cigarettes or if you have certain conditions.  Meningococcal vaccine.** / 1 dose if you are age 99 to 46 years and a Market researcher living in a residence hall, or have one of several medical conditions, you need to get vaccinated against meningococcal disease. You may also need additional booster doses.  Hepatitis A vaccine.** / Consult your health care provider.  Hepatitis B vaccine.** / Consult your health care provider.  Haemophilus influenzae type b (Hib) vaccine.** / Consult your health care provider. Ages 81 to 56 years  Blood pressure check.** / Every year.  Lipid and cholesterol check.** / Every 5 years beginning at age 43 years.  Lung cancer screening. / Every year if you are aged 55-80 years and have a 30-pack-year history of smoking and currently smoke or have quit within the past 15 years. Yearly screening is stopped once you have quit smoking for at least 15 years or develop a health problem that would prevent you from having lung cancer treatment.  Clinical breast exam.** / Every year after age 79 years.  BRCA-related cancer risk assessment.** / For women who have family members with a BRCA-related cancer (breast, ovarian, tubal, or peritoneal cancers).  Mammogram.** / Every year beginning at age 89 years and continuing for as long as you are in good health. Consult with your health care provider.  Pap test.** / Every 3 years starting at age 55 years through age  79 or 71 years with a history of 3 consecutive normal Pap tests.  HPV screening.** / Every 3 years from ages 67 years through ages 64 to 9 years with a history of 3 consecutive normal Pap tests.  Fecal occult blood test (FOBT) of stool. / Every year beginning at age 58 years and continuing until age 67 years. You may not need to do this test if you get a colonoscopy every 10 years.  Flexible sigmoidoscopy or colonoscopy.** / Every 5 years for a flexible sigmoidoscopy or every 10 years for a colonoscopy beginning at age 3 years and continuing until age 19 years.  Hepatitis C blood test.** / For all people born from [birth year] through 1965 and any individual with known risks for hepatitis C.  Skin self-exam. / Monthly.  Influenza vaccine. / Every year.  Tetanus, diphtheria, and acellular pertussis (Tdap/Td) vaccine.** / Consult your health care provider. Pregnant women should receive 1 dose of Tdap vaccine during each pregnancy. 1 dose of Td every 10 years.  Varicella vaccine.** / Consult your health care provider. Pregnant females who do not have evidence of immunity should receive the first dose after pregnancy.  Zoster vaccine.** / 1 dose for adults aged 32 years or older.  Measles, mumps, rubella (MMR) vaccine.** / You need at least 1 dose of MMR if you were born in 1957 or later. You may also need a second dose. For females of childbearing age, rubella immunity should be determined.  If there is no evidence of immunity, females who are not pregnant should be vaccinated. If there is no evidence of immunity, females who are pregnant should delay immunization until after pregnancy.  Pneumococcal 13-valent conjugate (PCV13) vaccine.** / Consult your health care provider.  Pneumococcal polysaccharide (PPSV23) vaccine.** / 1 to 2 doses if you smoke cigarettes or if you have certain conditions.  Meningococcal vaccine.** / Consult your health care provider.  Hepatitis A vaccine.** / Consult your  health care provider.  Hepatitis B vaccine.** / Consult your health care provider.  Haemophilus influenzae type b (Hib) vaccine.** / Consult your health care provider. Ages 69 years and over  Blood pressure check.** / Every year.  Lipid and cholesterol check.** / Every 5 years beginning at age 70 years.  Lung cancer screening. / Every year if you are aged 84-80 years and have a 30-pack-year history of smoking and currently smoke or have quit within the past 15 years. Yearly screening is stopped once you have quit smoking for at least 15 years or develop a health problem that would prevent you from having lung cancer treatment.  Clinical breast exam.** / Every year after age 67 years.  BRCA-related cancer risk assessment.** / For women who have family members with a BRCA-related cancer (breast, ovarian, tubal, or peritoneal cancers).  Mammogram.** / Every year beginning at age 54 years and continuing for as long as you are in good health. Consult with your health care provider.  Pap test.** / Every 3 years starting at age 26 years through age 80 or 24 years with 3 consecutive normal Pap tests. Testing can be stopped between 65 and 70 years with 3 consecutive normal Pap tests and no abnormal Pap or HPV tests in the past 10 years.  HPV screening.** / Every 3 years from ages 51 years through ages 33 or 8 years with a history of 3 consecutive normal Pap tests. Testing can be stopped between 65 and 70 years with 3 consecutive normal Pap tests and no abnormal Pap or HPV tests in the past 10 years.  Fecal occult blood test (FOBT) of stool. / Every year beginning at age 21 years and continuing until age 1 years. You may not need to do this test if you get a colonoscopy every 10 years.  Flexible sigmoidoscopy or colonoscopy.** / Every 5 years for a flexible sigmoidoscopy or every 10 years for a colonoscopy beginning at age 44 years and continuing until age 39 years.  Hepatitis C blood test.** /  For all people born from [birth year] through 1966 and any individual with known risks for hepatitis C.  Osteoporosis screening.** / A one-time screening for women ages 6 years and over and women at risk for fractures or osteoporosis.  Skin self-exam. / Monthly.  Influenza vaccine. / Every year.  Tetanus, diphtheria, and acellular pertussis (Tdap/Td) vaccine.** / 1 dose of Td every 10 years.  Varicella vaccine.** / Consult your health care provider.  Zoster vaccine.** / 1 dose for adults aged 38 years or older.  Pneumococcal 13-valent conjugate (PCV13) vaccine.** / Consult your health care provider.  Pneumococcal polysaccharide (PPSV23) vaccine.** / 1 dose for all adults aged 20 years and older.  Meningococcal vaccine.** / Consult your health care provider.  Hepatitis A vaccine.** / Consult your health care provider.  Hepatitis B vaccine.** / Consult your health care provider.  Haemophilus influenzae type b (Hib) vaccine.** / Consult your health care provider. ** Family history and personal history of risk and conditions  may change your health care provider's recommendations.   This information is not intended to replace advice given to you by your health care provider. Make sure you discuss any questions you have with your health care provider.   Document Released: 02/10/2002 Document Revised: 01/05/2015 Document Reviewed: 05/12/2011 Elsevier Interactive Patient Education [employer].

## 2016-11-10 NOTE — Assessment & Plan Note (Signed)
Tolerating statin, encouraged heart healthy diet, avoid trans fats, minimize simple carbs and saturated fats. Increase exercise as tolerated 

## 2016-11-19 ENCOUNTER — Other Ambulatory Visit: Payer: Self-pay | Admitting: Family Medicine

## 2016-11-19 DIAGNOSIS — I10 Essential (primary) hypertension: Secondary | ICD-10-CM

## 2016-11-19 DIAGNOSIS — J209 Acute bronchitis, unspecified: Secondary | ICD-10-CM

## 2016-11-19 NOTE — Telephone Encounter (Signed)
Medication filled to pharmacy as requested.   

## 2016-12-01 ENCOUNTER — Encounter: Payer: Self-pay | Admitting: Family Medicine

## 2016-12-03 MED ORDER — DOXYCYCLINE HYCLATE 100 MG PO TABS: 100.0000 mg | ORAL_TABLET | Freq: Two times a day (BID) | ORAL | 0 refills | Status: DC

## 2016-12-04 ENCOUNTER — Other Ambulatory Visit: Payer: Self-pay | Admitting: Family Medicine

## 2016-12-04 MED ORDER — DOXYCYCLINE HYCLATE 100 MG PO TABS: 100.0000 mg | ORAL_TABLET | Freq: Two times a day (BID) | ORAL | 0 refills | Status: DC

## 2016-12-04 NOTE — Telephone Encounter (Signed)
Please advise rx for diflucan.

## 2016-12-30 ENCOUNTER — Other Ambulatory Visit: Payer: Self-pay | Admitting: Family Medicine

## 2016-12-30 DIAGNOSIS — Z0000 Encounter for general adult medical examination without abnormal findings: Secondary | ICD-10-CM

## 2016-12-30 DIAGNOSIS — I10 Essential (primary) hypertension: Secondary | ICD-10-CM

## 2016-12-30 DIAGNOSIS — E1169 Type 2 diabetes mellitus with other specified complication: Secondary | ICD-10-CM

## 2016-12-30 DIAGNOSIS — E669 Obesity, unspecified: Secondary | ICD-10-CM

## 2016-12-30 DIAGNOSIS — E785 Hyperlipidemia, unspecified: Secondary | ICD-10-CM

## 2017-01-12 ENCOUNTER — Other Ambulatory Visit: Payer: Self-pay | Admitting: Family Medicine

## 2017-01-12 DIAGNOSIS — E785 Hyperlipidemia, unspecified: Secondary | ICD-10-CM

## 2017-01-12 DIAGNOSIS — I10 Essential (primary) hypertension: Secondary | ICD-10-CM

## 2017-01-12 DIAGNOSIS — E1169 Type 2 diabetes mellitus with other specified complication: Secondary | ICD-10-CM

## 2017-01-12 DIAGNOSIS — Z0000 Encounter for general adult medical examination without abnormal findings: Secondary | ICD-10-CM

## 2017-01-12 DIAGNOSIS — E669 Obesity, unspecified: Secondary | ICD-10-CM

## 2017-01-20 ENCOUNTER — Encounter: Payer: Self-pay | Admitting: Family Medicine

## 2017-01-20 ENCOUNTER — Other Ambulatory Visit: Payer: Self-pay | Admitting: Family Medicine

## 2017-01-20 DIAGNOSIS — E669 Obesity, unspecified: Secondary | ICD-10-CM

## 2017-01-20 DIAGNOSIS — I10 Essential (primary) hypertension: Secondary | ICD-10-CM

## 2017-01-20 DIAGNOSIS — E1169 Type 2 diabetes mellitus with other specified complication: Secondary | ICD-10-CM

## 2017-01-20 DIAGNOSIS — Z0000 Encounter for general adult medical examination without abnormal findings: Secondary | ICD-10-CM

## 2017-01-20 NOTE — Telephone Encounter (Signed)
Last refill #40 with 0 refills on 11/10/2016 Last office visit 11/10/2016

## 2017-01-20 NOTE — Telephone Encounter (Signed)
Ok to fill both meds but need UDS and contract?

## 2017-01-20 NOTE — Telephone Encounter (Signed)
Printed prescription/PCP signed/Printed Community education officercontract. Called the patient left message on cell/home number to call back. The patient will need to come to the office to pickup lorazepam as she needs to do UDS/contract.

## 2017-01-20 NOTE — Telephone Encounter (Signed)
Patient informed to pickup lorazepam/do contract and UDS>

## 2017-01-22 ENCOUNTER — Encounter: Payer: Self-pay | Admitting: Family Medicine

## 2017-02-11 ENCOUNTER — Other Ambulatory Visit: Payer: Self-pay | Admitting: Family Medicine

## 2017-02-11 DIAGNOSIS — J209 Acute bronchitis, unspecified: Secondary | ICD-10-CM

## 2017-02-11 DIAGNOSIS — I10 Essential (primary) hypertension: Secondary | ICD-10-CM

## 2017-02-13 ENCOUNTER — Ambulatory Visit (INDEPENDENT_AMBULATORY_CARE_PROVIDER_SITE_OTHER): Payer: BLUE CROSS/BLUE SHIELD | Admitting: Family Medicine

## 2017-02-13 VITALS — BP 142/82 | HR 63 | Temp 98.1°F | Wt 306.8 lb

## 2017-02-13 DIAGNOSIS — G629 Polyneuropathy, unspecified: Secondary | ICD-10-CM

## 2017-02-13 DIAGNOSIS — J209 Acute bronchitis, unspecified: Secondary | ICD-10-CM

## 2017-02-13 DIAGNOSIS — E11319 Type 2 diabetes mellitus with unspecified diabetic retinopathy without macular edema: Secondary | ICD-10-CM | POA: Diagnosis not present

## 2017-02-13 DIAGNOSIS — E669 Obesity, unspecified: Secondary | ICD-10-CM | POA: Diagnosis not present

## 2017-02-13 DIAGNOSIS — E785 Hyperlipidemia, unspecified: Secondary | ICD-10-CM

## 2017-02-13 DIAGNOSIS — Z0000 Encounter for general adult medical examination without abnormal findings: Secondary | ICD-10-CM | POA: Diagnosis not present

## 2017-02-13 DIAGNOSIS — E782 Mixed hyperlipidemia: Secondary | ICD-10-CM

## 2017-02-13 DIAGNOSIS — I10 Essential (primary) hypertension: Secondary | ICD-10-CM | POA: Diagnosis not present

## 2017-02-13 DIAGNOSIS — E1169 Type 2 diabetes mellitus with other specified complication: Secondary | ICD-10-CM | POA: Diagnosis not present

## 2017-02-13 LAB — COMPREHENSIVE METABOLIC PANEL
ALBUMIN: 3.8 g/dL (ref 3.5–5.2)
ALK PHOS: 90 U/L (ref 39–117)
ALT: 19 U/L (ref 0–35)
AST: 20 U/L (ref 0–37)
BILIRUBIN TOTAL: 0.5 mg/dL (ref 0.2–1.2)
BUN: 19 mg/dL (ref 6–23)
CO2: 25 mEq/L (ref 19–32)
Calcium: 9.1 mg/dL (ref 8.4–10.5)
Chloride: 101 mEq/L (ref 96–112)
Creatinine, Ser: 0.97 mg/dL (ref 0.40–1.20)
GFR: 65.55 mL/min (ref >60.00)
GLUCOSE: 119 mg/dL — AB (ref 70–99)
Potassium: 4.2 mEq/L (ref 3.5–5.1)
SODIUM: 135 meq/L (ref 135–145)
TOTAL PROTEIN: 7 g/dL (ref 6.0–8.3)

## 2017-02-13 LAB — LIPID PANEL
Cholesterol: 94 mg/dL (ref 0–200)
HDL: 20.3 mg/dL — ABNORMAL LOW (ref >39.00)
NonHDL: 73.83
Total CHOL/HDL Ratio: 5
Triglycerides: 239 mg/dL — ABNORMAL HIGH (ref 0.0–149.0)
VLDL: 47.8 mg/dL — ABNORMAL HIGH (ref 0.0–40.0)

## 2017-02-13 LAB — CBC
HCT: 38.6 % (ref 36.0–46.0)
Hemoglobin: 12.9 g/dL (ref 12.0–15.0)
MCHC: 33.3 g/dL (ref 30.0–36.0)
MCV: 88.6 fl (ref 78.0–100.0)
Platelets: 307 10*3/uL (ref 150.0–400.0)
RBC: 4.36 Mil/uL (ref 3.87–5.11)
RDW: 13.4 % (ref 11.5–15.5)
WBC: 8.6 10*3/uL (ref 4.0–10.5)

## 2017-02-13 LAB — LDL CHOLESTEROL, DIRECT: LDL DIRECT: 41 mg/dL

## 2017-02-13 LAB — TSH: TSH: 2.86 u[IU]/mL (ref 0.35–4.50)

## 2017-02-13 LAB — HEMOGLOBIN A1C: HEMOGLOBIN A1C: 8.2 % — AB (ref 4.6–6.5)

## 2017-02-13 MED ORDER — GLIPIZIDE 5 MG PO TABS: ORAL_TABLET | ORAL | 1 refills | Status: DC

## 2017-02-13 MED ORDER — MONTELUKAST SODIUM 10 MG PO TABS: 10.0000 mg | ORAL_TABLET | Freq: Every day | ORAL | 1 refills | Status: DC

## 2017-02-13 MED ORDER — METFORMIN HCL 1000 MG PO TABS: ORAL_TABLET | ORAL | 0 refills | Status: DC

## 2017-02-13 MED ORDER — ONETOUCH LANCETS MISC: 12 refills | Status: DC

## 2017-02-13 MED ORDER — GABAPENTIN 100 MG PO CAPS: ORAL_CAPSULE | ORAL | 5 refills | Status: DC

## 2017-02-13 MED ORDER — ALBUTEROL SULFATE HFA 108 (90 BASE) MCG/ACT IN AERS: 2.0000 | INHALATION_SPRAY | Freq: Four times a day (QID) | RESPIRATORY_TRACT | 1 refills | Status: DC | PRN

## 2017-02-13 MED ORDER — ATORVASTATIN CALCIUM 20 MG PO TABS: 20.0000 mg | ORAL_TABLET | Freq: Every day | ORAL | 1 refills | Status: DC

## 2017-02-13 MED ORDER — METOPROLOL TARTRATE 50 MG PO TABS: 50.0000 mg | ORAL_TABLET | Freq: Two times a day (BID) | ORAL | 1 refills | Status: DC

## 2017-02-13 MED ORDER — LISINOPRIL 20 MG PO TABS: 20.0000 mg | ORAL_TABLET | Freq: Two times a day (BID) | ORAL | 1 refills | Status: DC

## 2017-02-13 MED ORDER — HYDROCHLOROTHIAZIDE 25 MG PO TABS: 25.0000 mg | ORAL_TABLET | Freq: Every day | ORAL | 1 refills | Status: DC

## 2017-02-13 MED ORDER — LORAZEPAM 0.5 MG PO TABS: ORAL_TABLET | ORAL | 2 refills | Status: DC

## 2017-02-13 NOTE — Progress Notes (Signed)
Pre visit review using our clinic review tool, if applicable. No additional management support is needed unless otherwise documented below in the visit note. 

## 2017-02-13 NOTE — Assessment & Plan Note (Signed)
hgba1c acceptable, minimize simple carbs. Increase exercise as tolerated. Continue current meds 

## 2017-02-13 NOTE — Patient Instructions (Signed)
Carbohydrate Counting for Diabetes Mellitus, Adult Carbohydrate counting is a method for keeping track of how many carbohydrates you eat. Eating carbohydrates naturally increases the amount of sugar (glucose) in the blood. Counting how many carbohydrates you eat helps keep your blood glucose within normal limits, which helps you manage your diabetes (diabetes mellitus). It is important to know how many carbohydrates you can safely have in each meal. This is different for every person. A diet and nutrition specialist (registered dietitian) can help you make a meal plan and calculate how many carbohydrates you should have at each meal and snack. Carbohydrates are found in the following foods:  Grains, such as breads and cereals.  Dried beans and soy products.  Starchy vegetables, such as potatoes, peas, and corn.  Fruit and fruit juices.  Milk and yogurt.  Sweets and snack foods, such as cake, cookies, candy, chips, and soft drinks. How do I count carbohydrates? There are two ways to count carbohydrates in food. You can use either of the methods or a combination of both. Reading "Nutrition Facts" on packaged food  The "Nutrition Facts" list is included on the labels of almost all packaged foods and beverages in the [place] It includes:  The serving size.  Information about nutrients in each serving, including the grams (g) of carbohydrate per serving. To use the "Nutrition Facts":  Decide how many servings you will have.  Multiply the number of servings by the number of carbohydrates per serving.  The resulting number is the total amount of carbohydrates that you will be having. Learning standard serving sizes of other foods  When you eat foods containing carbohydrates that are not packaged or do not include "Nutrition Facts" on the label, you need to measure the servings in order to count the amount of carbohydrates:  Measure the foods that you will eat with a food scale or measuring  cup, if needed.  Decide how many standard-size servings you will eat.  Multiply the number of servings by 15. Most carbohydrate-rich foods have about 15 g of carbohydrates per serving.  For example, if you eat 8 oz (170 g) of strawberries, you will have eaten 2 servings and 30 g of carbohydrates (2 servings x 15 g = 30 g).  For foods that have more than one food mixed, such as soups and casseroles, you must count the carbohydrates in each food that is included. The following list contains standard serving sizes of common carbohydrate-rich foods. Each of these servings has about 15 g of carbohydrates:   hamburger bun or  English muffin.   oz (15 mL) syrup.   oz (14 g) jelly.  1 slice of bread.  1 six-inch tortilla.  3 oz (85 g) cooked rice or pasta.  4 oz (113 g) cooked dried beans.  4 oz (113 g) starchy vegetable, such as peas, corn, or potatoes.  4 oz (113 g) hot cereal.  4 oz (113 g) mashed potatoes or  of a large baked potato.  4 oz (113 g) canned or frozen fruit.  4 oz (120 mL) fruit juice.  4-6 crackers.  6 chicken nuggets.  6 oz (170 g) unsweetened dry cereal.  6 oz (170 g) plain fat-free yogurt or yogurt sweetened with artificial sweeteners.  8 oz (240 mL) milk.  8 oz (170 g) fresh fruit or one small piece of fruit.  24 oz (680 g) popped popcorn. Example of carbohydrate counting Sample meal  3 oz (85 g) chicken breast.  6 oz (  170 g) brown rice.  4 oz (113 g) corn.  8 oz (240 mL) milk.  8 oz (170 g) strawberries with sugar-free whipped topping. Carbohydrate calculation 1. Identify the foods that contain carbohydrates:  Rice.  Corn.  Milk.  Strawberries. 2. Calculate how many servings you have of each food:  2 servings rice.  1 serving corn.  1 serving milk.  1 serving strawberries. 3. Multiply each number of servings by 15 g:  2 servings rice x 15 g = 30 g.  1 serving corn x 15 g = 15 g.  1 serving milk x 15 g = 15  g.  1 serving strawberries x 15 g = 15 g. 4. Add together all of the amounts to find the total grams of carbohydrates eaten:  30 g + 15 g + 15 g + 15 g = 75 g of carbohydrates total. This information is not intended to replace advice given to you by your health care provider. Make sure you discuss any questions you have with your health care provider. Document Released: 12/15/2005 Document Revised: 07/04/2016 Document Reviewed: 05/28/2016 Elsevier Interactive Patient Education  2017 Elsevier Inc.  

## 2017-02-13 NOTE — Assessment & Plan Note (Signed)
Well controlled, no changes to meds. Encouraged heart healthy diet such as the DASH diet and exercise as tolerated.  °

## 2017-02-13 NOTE — Assessment & Plan Note (Addendum)
Encouraged heart healthy diet, increase exercise, avoid trans fats, consider a krill oil cap daily. Atorvastatin daily

## 2017-02-13 NOTE — Progress Notes (Signed)
Patient ID: April Warren, female   DOB: [birth year], 46 y.o.   MRN: [id number]   Subjective:    Patient ID: April Warren, female    DOB: 05/01/1971, 46 y.o.   MRN: [id number]  Chief Complaint  Patient presents with  . Follow-up  . Hypertension  . Diabetes  I acted as a Education administrator for Dr. Charlett Blake. [name], RMA   Hypertension  This is a chronic problem. The problem is uncontrolled. Associated symptoms include malaise/fatigue. Pertinent negatives include no blurred vision, chest pain, headaches, palpitations or shortness of breath.  Diabetes  She presents for her follow-up diabetic visit. Pertinent negatives for hypoglycemia include no headaches. Pertinent negatives for diabetes include no blurred vision and no chest pain.    Patient is in today for a follow up on numerous medical concerns includine DM, HTN, hyperlipidemia and obesity. She struggles with labile BP and notes feeling flushed at times when it rises to the 150s. Her greatest complaint is pain in her left foot. She describes it as sharp and shooting at the ball of her foot. Gabapentin has not caused any concerning side effects but she has not had much relief. The Lorazepam has helped th pain somewhat. Denies CP/palp/SOB/HA/congestion/fevers/GI or GU c/o. Taking meds as prescribed  Past Medical History:  Diagnosis Date  . Allergy   . Chicken pox 46 yrs old  . Diabetes mellitus 08-12-12   type 2- dr Zigmund Daniel diagnosed  . Diabetic retinopathy ([place])   . Dysmenorrhea 08/20/2012  . HTN (hypertension) 08/20/2012  . Hyperlipidemia, mixed 08/28/2013  . Neuropathy ([place]) 02/16/2017  . Obesity   . Obesity, unspecified 08/28/2013  . Other and unspecified hyperlipidemia 08/28/2013  . Preventative health care 08/20/2012  . Sinusitis 11/14/2013  . Sinusitis, acute 05/02/2016    Past Surgical History:  Procedure Laterality Date  . REFRACTIVE SURGERY  08-12-12   right, left on 09/16/12    Family History  Problem Relation Age of Onset  . Cancer  Mother 54    breast  . Diabetes Mother     type 2  . Hypertension Mother   . Hypertension Father   . Hyperlipidemia Father   . COPD Father   . Hypertension Maternal Grandmother   . Hyperlipidemia Maternal Grandmother   . COPD Maternal Grandmother   . Asthma Maternal Grandmother   . Hypertension Maternal Grandfather   . Heart attack Maternal Grandfather   . Gout Brother   . Cancer Brother     lung  . Heart disease Paternal Grandfather     Social History   Social History  . Marital status: Married    Spouse name: N/A  . Number of children: N/A  . Years of education: N/A   Occupational History  . Not on file.   Social History Main Topics  . Smoking status: Never Smoker  . Smokeless tobacco: Never Used  . Alcohol use Yes     Comment: socially  . Drug use: No  . Sexual activity: Yes    Partners: Male   Other Topics Concern  . Not on file   Social History Narrative   Lives with husband.    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    Not exercising    Outpatient Medications Prior to Visit  Medication Sig Dispense Refill  . Blood Glucose Monitoring Suppl (ONE TOUCH BASIC SYSTEM) W/DEVICE KIT Test bid prn 1 each 0  .  glucose blood (ONE TOUCH ULTRA TEST) test strip TEST TWICE A DAY DX E11.9 100 each 5  . ibuprofen (ADVIL,MOTRIN) 200 MG tablet Take 400 mg by mouth every 8 (eight) hours as needed.    Marland Kitchen albuterol (PROVENTIL HFA;VENTOLIN HFA) 108 (90 Base) MCG/ACT inhaler Inhale 2 puffs into the lungs every 6 (six) hours as needed for wheezing or shortness of breath. 1 Inhaler 0  . atorvastatin (LIPITOR) 20 MG tablet Take 1 tablet (20 mg total) by mouth daily. 90 tablet 1  . glipiZIDE (GLUCOTROL) 5 MG tablet TAKE TWO TABLETS BY MOUTH EVERY MORNING AND ONE TABLET BY MOUTH EVERY EVENING 90 tablet 1  . hydrochlorothiazide (HYDRODIURIL) 25 MG tablet Take 1 Tablet by mouth once  daily 90 tablet 0  . lisinopril (PRINIVIL,ZESTRIL) 20 MG tablet Take 1 Tablet by mouth 2 times a day 180 tablet 0  . LORazepam (ATIVAN) 0.5 MG tablet Take 1 tablet by mouth 2 times a day as needed for anxiety or sleep 40 tablet 0  . metFORMIN (GLUCOPHAGE) 1000 MG tablet Take 1 Tablet by mouth 2 times a day with meals 180 tablet 0  . metoprolol (LOPRESSOR) 50 MG tablet Take 1 Tablet by mouth 2 times a day 60 tablet 1  . montelukast (SINGULAIR) 10 MG tablet Take 1 tablet (10 mg total) by mouth at bedtime. 30 tablet 3  . ONE TOUCH LANCETS MISC Check two daily DX E11.9 200 each 12  . doxycycline (VIBRA-TABS) 100 MG tablet Take 1 tablet (100 mg total) by mouth 2 (two) times daily. 20 tablet 0  . gabapentin (NEURONTIN) 100 MG capsule TAKE ONE TO THREE CAPSULES BY MOUTH AT bedtime as instructed 90 capsule 5   No facility-administered medications prior to visit.     No Known Allergies  Review of Systems  Constitutional: Positive for malaise/fatigue. Negative for fever.  HENT: Negative for congestion.   Eyes: Negative for blurred vision.  Respiratory: Negative for cough and shortness of breath.   Cardiovascular: Negative for chest pain, palpitations and leg swelling.  Gastrointestinal: Negative for vomiting.  Musculoskeletal: Positive for joint pain. Negative for back pain.  Skin: Negative for rash.  Neurological: Negative for loss of consciousness and headaches.       Objective:    Physical Exam  Constitutional: She is oriented to person, place, and time. She appears well-developed and well-nourished. No distress.  HENT:  Head: Normocephalic and atraumatic.  Eyes: Conjunctivae are normal.  Neck: Normal range of motion. No thyromegaly present.  Cardiovascular: Normal rate and regular rhythm.   Pulmonary/Chest: Effort normal and breath sounds normal. She has no wheezes.  Abdominal: Soft. Bowel sounds are normal. There is no tenderness.  Musculoskeletal: Normal range of motion. She  exhibits no edema or deformity.  Neurological: She is alert and oriented to person, place, and time.  Skin: Skin is warm and dry. She is not diaphoretic.  Psychiatric: She has a normal mood and affect.    BP (!) 142/82   Pulse 63   Temp 98.1 F (36.7 C) (Oral)   Wt (!) 306 lb 12.8 oz (139.2 kg)   SpO2 98%   BMI 45.31 kg/m  Wt Readings from Last 3 Encounters:  02/13/17 (!) 306 lb 12.8 oz (139.2 kg)  11/10/16 (!) 305 lb 6 oz (138.5 kg)  08/07/16 (!) 303 lb 3.2 oz (137.5 kg)     Lab Results  Component Value Date   WBC 8.6 02/13/2017   HGB 12.9 02/13/2017   HCT 38.6  02/13/2017   PLT 307.0 02/13/2017   GLUCOSE 119 (H) 02/13/2017   CHOL 94 02/13/2017   TRIG 239.0 (H) 02/13/2017   HDL 20.30 (L) 02/13/2017   LDLDIRECT 41.0 02/13/2017   LDLCALC 47 11/10/2016   ALT 19 02/13/2017   AST 20 02/13/2017   NA 135 02/13/2017   K 4.2 02/13/2017   CL 101 02/13/2017   CREATININE 0.97 02/13/2017   BUN 19 02/13/2017   CO2 25 02/13/2017   TSH 2.86 02/13/2017   INR 1.03 03/04/2011   HGBA1C 8.2 (H) 02/13/2017   MICROALBUR 19.6 (H) 01/29/2016    Lab Results  Component Value Date   TSH 2.86 02/13/2017   Lab Results  Component Value Date   WBC 8.6 02/13/2017   HGB 12.9 02/13/2017   HCT 38.6 02/13/2017   MCV 88.6 02/13/2017   PLT 307.0 02/13/2017   Lab Results  Component Value Date   NA 135 02/13/2017   K 4.2 02/13/2017   CO2 25 02/13/2017   GLUCOSE 119 (H) 02/13/2017   BUN 19 02/13/2017   CREATININE 0.97 02/13/2017   BILITOT 0.5 02/13/2017   ALKPHOS 90 02/13/2017   AST 20 02/13/2017   ALT 19 02/13/2017   PROT 7.0 02/13/2017   ALBUMIN 3.8 02/13/2017   CALCIUM 9.1 02/13/2017   GFR 65.55 02/13/2017   Lab Results  Component Value Date   CHOL 94 02/13/2017   Lab Results  Component Value Date   HDL 20.30 (L) 02/13/2017   Lab Results  Component Value Date   LDLCALC 47 11/10/2016   Lab Results  Component Value Date   TRIG 239.0 (H) 02/13/2017   Lab Results    Component Value Date   CHOLHDL 5 02/13/2017   Lab Results  Component Value Date   HGBA1C 8.2 (H) 02/13/2017       Assessment & Plan:   Problem List Items Addressed This Visit    Diabetes mellitus type 2 in obese ([place])    hgba1c acceptable, minimize simple carbs. Increase exercise as tolerated. Continue current meds      Relevant Medications   LORazepam (ATIVAN) 0.5 MG tablet   ONE TOUCH LANCETS MISC   metFORMIN (GLUCOPHAGE) 1000 MG tablet   lisinopril (PRINIVIL,ZESTRIL) 20 MG tablet   hydrochlorothiazide (HYDRODIURIL) 25 MG tablet   Other Relevant Orders   Hemoglobin A1c (Completed)   Diabetic retinopathy (HCC)   Relevant Medications   metFORMIN (GLUCOPHAGE) 1000 MG tablet   lisinopril (PRINIVIL,ZESTRIL) 20 MG tablet   Obesity    Encouraged DASH diet, decrease po intake and increase exercise as tolerated. Needs 7-8 hours of sleep nightly. Avoid trans fats, eat small, frequent meals every 4-5 hours with lean proteins, complex carbs and healthy fats. Minimize simple carbs, referred to bariatrics for consideration      Relevant Medications   metFORMIN (GLUCOPHAGE) 1000 MG tablet   lisinopril (PRINIVIL,ZESTRIL) 20 MG tablet   hydrochlorothiazide (HYDRODIURIL) 25 MG tablet   HTN (hypertension)    Well controlled, no changes to meds. Encouraged heart healthy diet such as the DASH diet and exercise as tolerated.       Relevant Medications   LORazepam (ATIVAN) 0.5 MG tablet   metoprolol (LOPRESSOR) 50 MG tablet   ONE TOUCH LANCETS MISC   metFORMIN (GLUCOPHAGE) 1000 MG tablet   lisinopril (PRINIVIL,ZESTRIL) 20 MG tablet   hydrochlorothiazide (HYDRODIURIL) 25 MG tablet   albuterol (PROVENTIL HFA;VENTOLIN HFA) 108 (90 Base) MCG/ACT inhaler   Other Relevant Orders   CBC (Completed)  Comprehensive metabolic panel (Completed)   TSH (Completed)   Preventative health care   Relevant Medications   LORazepam (ATIVAN) 0.5 MG tablet   ONE TOUCH LANCETS MISC   metFORMIN  (GLUCOPHAGE) 1000 MG tablet   lisinopril (PRINIVIL,ZESTRIL) 20 MG tablet   hydrochlorothiazide (HYDRODIURIL) 25 MG tablet   Hyperlipidemia, mixed - Primary    Encouraged heart healthy diet, increase exercise, avoid trans fats, consider a krill oil cap daily. Atorvastatin daily      Relevant Medications   metoprolol (LOPRESSOR) 50 MG tablet   lisinopril (PRINIVIL,ZESTRIL) 20 MG tablet   hydrochlorothiazide (HYDRODIURIL) 25 MG tablet   Other Relevant Orders   Lipid panel (Completed)   Neuropathy (HCC)    L > R foot. Will increase the Gabapentin as directed and add topical lidocaine. Reassess.       Other Visit Diagnoses    Acute bronchitis, unspecified organism       Relevant Medications   metoprolol (LOPRESSOR) 50 MG tablet   albuterol (PROVENTIL HFA;VENTOLIN HFA) 108 (90 Base) MCG/ACT inhaler   Hyperlipidemia, unspecified hyperlipidemia type       Relevant Medications   metoprolol (LOPRESSOR) 50 MG tablet   metFORMIN (GLUCOPHAGE) 1000 MG tablet   lisinopril (PRINIVIL,ZESTRIL) 20 MG tablet   hydrochlorothiazide (HYDRODIURIL) 25 MG tablet      I have discontinued Ms. Scholler's atorvastatin, doxycycline, glipiZIDE, and gabapentin. I have also changed her gabapentin, metoprolol, lisinopril, and hydrochlorothiazide. Additionally, I am having her maintain her ibuprofen, ONE TOUCH BASIC SYSTEM, glucose blood, LORazepam, montelukast, ONE TOUCH LANCETS, metFORMIN, and albuterol.  Meds ordered this encounter  Medications  . DISCONTD: gabapentin (NEURONTIN) 100 MG capsule    Sig: Take 100 mg by mouth daily.    Refill:  5  . LORazepam (ATIVAN) 0.5 MG tablet    Sig: Take 1 tablet by mouth 2 times a day as needed for anxiety or sleep    Dispense:  40 tablet    Refill:  2  . gabapentin (NEURONTIN) 100 MG capsule    Sig: Take 154m twice daily and [id number] bedtime    Dispense:  180 capsule    Refill:  5  . metoprolol (LOPRESSOR) 50 MG tablet    Sig: Take 1 tablet (50 mg total) by mouth 2  (two) times daily.    Dispense:  180 tablet    Refill:  1  . montelukast (SINGULAIR) 10 MG tablet    Sig: Take 1 tablet (10 mg total) by mouth at bedtime.    Dispense:  90 tablet    Refill:  1  . ONE TOUCH LANCETS MISC    Sig: Check two daily DX E11.9    Dispense:  200 each    Refill:  12  . metFORMIN (GLUCOPHAGE) 1000 MG tablet    Sig: Take 1 Tablet by mouth 2 times a day with meals    Dispense:  180 tablet    Refill:  0  . lisinopril (PRINIVIL,ZESTRIL) 20 MG tablet    Sig: Take 1 tablet (20 mg total) by mouth 2 (two) times daily.    Dispense:  180 tablet    Refill:  1  . hydrochlorothiazide (HYDRODIURIL) 25 MG tablet    Sig: Take 1 tablet (25 mg total) by mouth daily.    Dispense:  90 tablet    Refill:  1  . DISCONTD: glipiZIDE (GLUCOTROL) 5 MG tablet    Sig: TAKE TWO TABLETS BY MOUTH EVERY MORNING AND ONE TABLET BY MOUTH EVERY EVENING  Dispense:  270 tablet    Refill:  1  . DISCONTD: atorvastatin (LIPITOR) 20 MG tablet    Sig: Take 1 tablet (20 mg total) by mouth daily.    Dispense:  90 tablet    Refill:  1  . albuterol (PROVENTIL HFA;VENTOLIN HFA) 108 (90 Base) MCG/ACT inhaler    Sig: Inhale 2 puffs into the lungs every 6 (six) hours as needed for wheezing or shortness of breath.    Dispense:  1 Inhaler    Refill:  1    CMA served as scribe during this visit. History, Physical and Plan performed by medical provider. Documentation and orders reviewed and attested to.  Penni Homans, MD

## 2017-02-16 ENCOUNTER — Other Ambulatory Visit: Payer: Self-pay | Admitting: Family Medicine

## 2017-02-16 ENCOUNTER — Encounter: Payer: Self-pay | Admitting: Family Medicine

## 2017-02-16 DIAGNOSIS — I10 Essential (primary) hypertension: Secondary | ICD-10-CM

## 2017-02-16 DIAGNOSIS — E1169 Type 2 diabetes mellitus with other specified complication: Secondary | ICD-10-CM

## 2017-02-16 DIAGNOSIS — Z0000 Encounter for general adult medical examination without abnormal findings: Secondary | ICD-10-CM

## 2017-02-16 DIAGNOSIS — G629 Polyneuropathy, unspecified: Secondary | ICD-10-CM | POA: Insufficient documentation

## 2017-02-16 DIAGNOSIS — E669 Obesity, unspecified: Secondary | ICD-10-CM

## 2017-02-16 HISTORY — DX: Polyneuropathy, unspecified: G62.9

## 2017-02-16 MED ORDER — ATORVASTATIN CALCIUM 40 MG PO TABS: 40.0000 mg | ORAL_TABLET | Freq: Every day | ORAL | 5 refills | Status: DC

## 2017-02-16 MED ORDER — GLIPIZIDE 5 MG PO TABS: 5.0000 mg | ORAL_TABLET | Freq: Two times a day (BID) | ORAL | 3 refills | Status: DC

## 2017-02-16 NOTE — Assessment & Plan Note (Signed)
Encouraged DASH diet, decrease po intake and increase exercise as tolerated. Needs 7-8 hours of sleep nightly. Avoid trans fats, eat small, frequent meals every 4-5 hours with lean proteins, complex carbs and healthy fats. Minimize simple carbs, referred to bariatrics for consideration 

## 2017-02-16 NOTE — Assessment & Plan Note (Signed)
L > R foot. Will increase the Gabapentin as directed and add topical lidocaine. Reassess.

## 2017-02-24 ENCOUNTER — Encounter: Payer: Self-pay | Admitting: Family Medicine

## 2017-02-24 ENCOUNTER — Other Ambulatory Visit: Payer: Self-pay | Admitting: Family Medicine

## 2017-02-24 DIAGNOSIS — Z0000 Encounter for general adult medical examination without abnormal findings: Secondary | ICD-10-CM

## 2017-02-24 DIAGNOSIS — E1169 Type 2 diabetes mellitus with other specified complication: Secondary | ICD-10-CM

## 2017-02-24 DIAGNOSIS — I10 Essential (primary) hypertension: Secondary | ICD-10-CM

## 2017-02-24 DIAGNOSIS — E669 Obesity, unspecified: Secondary | ICD-10-CM

## 2017-02-24 MED ORDER — GLIPIZIDE 10 MG PO TABS: 10.0000 mg | ORAL_TABLET | Freq: Two times a day (BID) | ORAL | 3 refills | Status: DC

## 2017-02-24 MED ORDER — GLIPIZIDE 5 MG PO TABS: ORAL_TABLET | ORAL | 3 refills | Status: DC

## 2017-03-18 ENCOUNTER — Ambulatory Visit (INDEPENDENT_AMBULATORY_CARE_PROVIDER_SITE_OTHER): Payer: BLUE CROSS/BLUE SHIELD | Admitting: Ophthalmology

## 2017-04-13 ENCOUNTER — Ambulatory Visit (INDEPENDENT_AMBULATORY_CARE_PROVIDER_SITE_OTHER): Admitting: Ophthalmology

## 2017-04-13 DIAGNOSIS — I10 Essential (primary) hypertension: Secondary | ICD-10-CM | POA: Diagnosis not present

## 2017-04-13 DIAGNOSIS — E113393 Type 2 diabetes mellitus with moderate nonproliferative diabetic retinopathy without macular edema, bilateral: Secondary | ICD-10-CM

## 2017-04-13 DIAGNOSIS — H43813 Vitreous degeneration, bilateral: Secondary | ICD-10-CM | POA: Diagnosis not present

## 2017-04-13 DIAGNOSIS — E11319 Type 2 diabetes mellitus with unspecified diabetic retinopathy without macular edema: Secondary | ICD-10-CM

## 2017-04-13 DIAGNOSIS — H35033 Hypertensive retinopathy, bilateral: Secondary | ICD-10-CM

## 2017-04-13 LAB — HM DIABETES EYE EXAM

## 2017-04-14 ENCOUNTER — Encounter: Payer: Self-pay | Admitting: Family Medicine

## 2017-05-11 ENCOUNTER — Telehealth: Payer: Self-pay | Admitting: Family Medicine

## 2017-05-11 ENCOUNTER — Ambulatory Visit: Admitting: Family Medicine

## 2017-05-11 NOTE — Telephone Encounter (Signed)
Pt called in today at 7:36 to cancel her appt. Pt says that she has been up all night vomiting she is unable to make her appt today.   Should pt be charged?

## 2017-05-11 NOTE — Telephone Encounter (Signed)
No chargero

## 2017-05-15 ENCOUNTER — Encounter: Payer: Self-pay | Admitting: Family Medicine

## 2017-05-19 ENCOUNTER — Other Ambulatory Visit: Payer: Self-pay | Admitting: Family Medicine

## 2017-06-15 ENCOUNTER — Other Ambulatory Visit: Payer: Self-pay | Admitting: Family Medicine

## 2017-06-15 DIAGNOSIS — E785 Hyperlipidemia, unspecified: Secondary | ICD-10-CM

## 2017-06-15 DIAGNOSIS — Z0000 Encounter for general adult medical examination without abnormal findings: Secondary | ICD-10-CM

## 2017-06-15 DIAGNOSIS — E1169 Type 2 diabetes mellitus with other specified complication: Secondary | ICD-10-CM

## 2017-06-15 DIAGNOSIS — E669 Obesity, unspecified: Secondary | ICD-10-CM

## 2017-06-15 DIAGNOSIS — I10 Essential (primary) hypertension: Secondary | ICD-10-CM

## 2017-06-18 ENCOUNTER — Other Ambulatory Visit: Payer: Self-pay | Admitting: Family Medicine

## 2017-06-18 DIAGNOSIS — Z0000 Encounter for general adult medical examination without abnormal findings: Secondary | ICD-10-CM

## 2017-06-18 DIAGNOSIS — I10 Essential (primary) hypertension: Secondary | ICD-10-CM

## 2017-06-18 DIAGNOSIS — J209 Acute bronchitis, unspecified: Secondary | ICD-10-CM

## 2017-06-18 DIAGNOSIS — E785 Hyperlipidemia, unspecified: Secondary | ICD-10-CM

## 2017-06-18 DIAGNOSIS — E669 Obesity, unspecified: Secondary | ICD-10-CM

## 2017-06-18 DIAGNOSIS — E1169 Type 2 diabetes mellitus with other specified complication: Secondary | ICD-10-CM

## 2017-07-13 ENCOUNTER — Ambulatory Visit (INDEPENDENT_AMBULATORY_CARE_PROVIDER_SITE_OTHER): Admitting: Family Medicine

## 2017-07-13 VITALS — BP 152/76 | HR 72 | Temp 97.9°F | Resp 18 | Wt 308.0 lb

## 2017-07-13 DIAGNOSIS — Z0000 Encounter for general adult medical examination without abnormal findings: Secondary | ICD-10-CM

## 2017-07-13 DIAGNOSIS — J209 Acute bronchitis, unspecified: Secondary | ICD-10-CM | POA: Diagnosis not present

## 2017-07-13 DIAGNOSIS — R252 Cramp and spasm: Secondary | ICD-10-CM | POA: Diagnosis not present

## 2017-07-13 DIAGNOSIS — I10 Essential (primary) hypertension: Secondary | ICD-10-CM | POA: Diagnosis not present

## 2017-07-13 DIAGNOSIS — E1169 Type 2 diabetes mellitus with other specified complication: Secondary | ICD-10-CM | POA: Diagnosis not present

## 2017-07-13 DIAGNOSIS — E669 Obesity, unspecified: Secondary | ICD-10-CM

## 2017-07-13 DIAGNOSIS — G629 Polyneuropathy, unspecified: Secondary | ICD-10-CM | POA: Diagnosis not present

## 2017-07-13 DIAGNOSIS — E785 Hyperlipidemia, unspecified: Secondary | ICD-10-CM

## 2017-07-13 DIAGNOSIS — E782 Mixed hyperlipidemia: Secondary | ICD-10-CM | POA: Diagnosis not present

## 2017-07-13 LAB — CBC
HCT: 38.1 % (ref 36.0–46.0)
Hemoglobin: 12.9 g/dL (ref 12.0–15.0)
MCHC: 33.8 g/dL (ref 30.0–36.0)
MCV: 90.3 fl (ref 78.0–100.0)
Platelets: 309 10*3/uL (ref 150.0–400.0)
RBC: 4.22 Mil/uL (ref 3.87–5.11)
RDW: 13 % (ref 11.5–15.5)
WBC: 8.6 10*3/uL (ref 4.0–10.5)

## 2017-07-13 LAB — COMPREHENSIVE METABOLIC PANEL
ALBUMIN: 3.5 g/dL (ref 3.5–5.2)
ALT: 12 U/L (ref 0–35)
AST: 14 U/L (ref 0–37)
Alkaline Phosphatase: 106 U/L (ref 39–117)
BILIRUBIN TOTAL: 0.4 mg/dL (ref 0.2–1.2)
BUN: 19 mg/dL (ref 6–23)
CALCIUM: 8.3 mg/dL — AB (ref 8.4–10.5)
CO2: 27 meq/L (ref 19–32)
CREATININE: 0.8 mg/dL (ref 0.40–1.20)
Chloride: 100 mEq/L (ref 96–112)
GFR: 81.73 mL/min (ref >60.00)
Glucose, Bld: 205 mg/dL — ABNORMAL HIGH (ref 70–99)
Potassium: 4.5 mEq/L (ref 3.5–5.1)
Sodium: 136 mEq/L (ref 135–145)
TOTAL PROTEIN: 6.7 g/dL (ref 6.0–8.3)

## 2017-07-13 LAB — LIPID PANEL
CHOL/HDL RATIO: 5
CHOLESTEROL: 87 mg/dL (ref 0–200)
HDL: 18.6 mg/dL — ABNORMAL LOW (ref >39.00)
Triglycerides: 506 mg/dL — ABNORMAL HIGH (ref 0.0–149.0)

## 2017-07-13 LAB — TSH: TSH: 2.6 u[IU]/mL (ref 0.35–4.50)

## 2017-07-13 LAB — MAGNESIUM: Magnesium: 1.5 mg/dL (ref 1.5–2.5)

## 2017-07-13 LAB — HEMOGLOBIN A1C: HEMOGLOBIN A1C: 8.4 % — AB (ref 4.6–6.5)

## 2017-07-13 LAB — LDL CHOLESTEROL, DIRECT: Direct LDL: 34 mg/dL

## 2017-07-13 MED ORDER — LORAZEPAM 0.5 MG PO TABS: ORAL_TABLET | ORAL | 2 refills | Status: DC

## 2017-07-13 MED ORDER — TRIAMTERENE-HCTZ 37.5-25 MG PO TABS: 1.0000 | ORAL_TABLET | Freq: Every day | ORAL | 1 refills | Status: DC

## 2017-07-13 MED ORDER — METFORMIN HCL ER 750 MG PO TB24: 750.0000 mg | ORAL_TABLET | Freq: Three times a day (TID) | ORAL | 1 refills | Status: DC

## 2017-07-13 MED ORDER — LISINOPRIL 20 MG PO TABS: 20.0000 mg | ORAL_TABLET | Freq: Two times a day (BID) | ORAL | 1 refills | Status: DC

## 2017-07-13 MED ORDER — MONTELUKAST SODIUM 10 MG PO TABS: 10.0000 mg | ORAL_TABLET | Freq: Every day | ORAL | 1 refills | Status: DC

## 2017-07-13 MED ORDER — GABAPENTIN 300 MG PO CAPS: ORAL_CAPSULE | ORAL | 1 refills | Status: DC

## 2017-07-13 MED ORDER — METOPROLOL TARTRATE 50 MG PO TABS: 50.0000 mg | ORAL_TABLET | Freq: Two times a day (BID) | ORAL | 1 refills | Status: DC

## 2017-07-13 MED ORDER — ATORVASTATIN CALCIUM 40 MG PO TABS: 40.0000 mg | ORAL_TABLET | Freq: Every day | ORAL | 5 refills | Status: DC

## 2017-07-13 MED ORDER — ALBUTEROL SULFATE HFA 108 (90 BASE) MCG/ACT IN AERS: 2.0000 | INHALATION_SPRAY | Freq: Four times a day (QID) | RESPIRATORY_TRACT | 5 refills | Status: DC | PRN

## 2017-07-13 NOTE — Assessment & Plan Note (Signed)
Increase gabapentin to 300mg bid and 600mg qhs 

## 2017-07-13 NOTE — Assessment & Plan Note (Signed)
Poorly controlled will alter medications, encouraged DASH diet, minimize caffeine and obtain adequate sleep. Report concerning symptoms and follow up as directed and as needed 

## 2017-07-13 NOTE — Assessment & Plan Note (Signed)
Encouraged DASH diet, decrease po intake and increase exercise as tolerated. Needs 7-8 hours of sleep nightly. Avoid trans fats, eat small, frequent meals every 4-5 hours with lean proteins, complex carbs and healthy fats. Minimize simple carbs, bariatric referral 

## 2017-07-13 NOTE — Patient Instructions (Signed)

## 2017-07-13 NOTE — Assessment & Plan Note (Signed)
Hydrate, check electrolytes including magnesium, she does take a supplement

## 2017-07-13 NOTE — Assessment & Plan Note (Addendum)
hgba1c mildly elevated at last check, minimize simple carbs. Increase exercise as tolerated. Continue current meds. Check hgba1c today because she notes sugars running in the 200 and 300s lately. Change Metformin XR to 750 mg tabs 1 tid, continue Glipizide and consider Victoza

## 2017-07-13 NOTE — Assessment & Plan Note (Signed)
Tolerating statin, encouraged heart healthy diet, avoid trans fats, minimize simple carbs and saturated fats. Increase exercise as tolerated 

## 2017-07-13 NOTE — Progress Notes (Signed)
Subjective:  I acted as a Education administrator for Dr. Charlett Blake. [name], [place]  Patient ID: April Warren, female    DOB: 28-May-1971, 46 y.o.   MRN: [id number]  No chief complaint on file.   HPI  Patient is in today for a follow up. Patient has no acute concerns. She is accompanied by her husband and their 84-monthold foster son. They acknowledge that he is not sleeping through the night she has had increased stress with poor sleep since he arrived but are otherwise enjoying their new addition. She denies any recent febrile illness or hospitalization. She endorses that her blood sugars have been running high the last few months. At her last visit she was staying in the low 100s but she now reports her blood sugars are generally hanging between 250 and 350. She denies any significant changes to her diet although she endorses a few more carbs with the increased stress. She is not exercising. Denies CP/palp/SOB/HA/congestion/fevers/GI or GU c/o. Taking meds as prescribed  Patient Care Team: BMosie Lukes MD as PCP - General (Family Medicine)   Past Medical History:  Diagnosis Date  . Allergy   . Chicken pox 46yrs old  . Diabetes mellitus 08-12-12   type 2- dr mZigmund Danieldiagnosed  . [place]   . Dysmenorrhea 08/20/2012  . HTN (hypertension) 08/20/2012  . Hyperlipidemia, mixed 08/28/2013  . Neuropathy ([place] 02/16/2017  . Obesity   . Obesity, unspecified 08/28/2013  . Other and unspecified hyperlipidemia 08/28/2013  . Preventative health care 08/20/2012  . Sinusitis 11/14/2013  . Sinusitis, acute 05/02/2016    Past Surgical History:  Procedure Laterality Date  . REFRACTIVE SURGERY  08-12-12   right, left on 09/16/12    Family History  Problem Relation Age of Onset  . Cancer Mother 553      breast  . Diabetes Mother        type 2  . Hypertension Mother   . Hypertension Father   . Hyperlipidemia Father   . COPD Father   . Hypertension Maternal Grandmother   . Hyperlipidemia Maternal  Grandmother   . COPD Maternal Grandmother   . Asthma Maternal Grandmother   . Hypertension Maternal Grandfather   . Heart attack Maternal Grandfather   . Gout Brother   . Cancer Brother        lung  . Heart disease Paternal Grandfather     Social History   Social History  . Marital status: Married    Spouse name: N/A  . Number of children: N/A  . Years of education: N/A   Occupational History  . Not on file.   Social History Main Topics  . Smoking status: Never Smoker  . Smokeless tobacco: Never Used  . Alcohol use Yes     Comment: socially  . Drug use: No  . Sexual activity: Yes    Partners: Male   Other Topics Concern  . Not on file   Social History Narrative   Lives with husband.    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    Not exercising    Outpatient Medications Prior to Visit  Medication Sig Dispense Refill  . Blood Glucose Monitoring Suppl (ONE TOUCH BASIC SYSTEM) W/DEVICE KIT Test bid prn 1 each 0  . glipiZIDE (GLUCOTROL) 10 MG tablet Take 1 Tablet by mouth 2 times a day 60 tablet 6  .  glucose blood (ONE TOUCH ULTRA TEST) test strip TEST TWICE A DAY DX E11.9 100 each 5  . ibuprofen (ADVIL,MOTRIN) 200 MG tablet Take 400 mg by mouth every 8 (eight) hours as needed.    . ONE TOUCH LANCETS MISC Check two daily DX E11.9 200 each 12  . albuterol (PROVENTIL HFA;VENTOLIN HFA) 108 (90 Base) MCG/ACT inhaler Inhale 2 puffs into the lungs every 6 (six) hours as needed for wheezing or shortness of breath. 1 Inhaler 1  . atorvastatin (LIPITOR) 40 MG tablet Take 1 tablet (40 mg total) by mouth daily. 30 tablet 5  . gabapentin (NEURONTIN) 100 MG capsule Take 11m twice daily and 402mat bedtime 180 capsule 5  . hydrochlorothiazide (HYDRODIURIL) 25 MG tablet Take 1 Tablet by mouth once daily 90 tablet 1  . lisinopril (PRINIVIL,ZESTRIL) 20 MG tablet Take 1 Tablet by mouth 2  times a day 180 tablet 1  . LORazepam (ATIVAN) 0.5 MG tablet Take 1 tablet by mouth 2 times a day as needed for anxiety or sleep 40 tablet 2  . metFORMIN (GLUCOPHAGE) 1000 MG tablet Take 1 Tablet by mouth 2 times a day with meals 180 tablet 0  . metoprolol tartrate (LOPRESSOR) 50 MG tablet Take 1 Tablet by mouth 2 times a day 180 tablet 1  . montelukast (SINGULAIR) 10 MG tablet Take 1 tablet (10 mg total) by mouth at bedtime. 90 tablet 1   No facility-administered medications prior to visit.     No Known Allergies  Review of Systems  Constitutional: Positive for malaise/fatigue. Negative for fever.  HENT: Negative for congestion.   Eyes: Negative for blurred vision.  Respiratory: Negative for cough and shortness of breath.   Cardiovascular: Negative for chest pain, palpitations and leg swelling.  Gastrointestinal: Negative for vomiting.  Musculoskeletal: Positive for joint pain and myalgias. Negative for back pain.  Skin: Negative for rash.  Neurological: Negative for loss of consciousness and headaches.       Objective:    Physical Exam  Constitutional: She is oriented to person, place, and time. She appears well-developed and well-nourished. No distress.  HENT:  Head: Normocephalic and atraumatic.  Eyes: Conjunctivae are normal.  Neck: Normal range of motion. No thyromegaly present.  Cardiovascular: Normal rate and regular rhythm.   Pulmonary/Chest: Effort normal and breath sounds normal. She has no wheezes.  Abdominal: Soft. Bowel sounds are normal. There is no tenderness.  Musculoskeletal: Normal range of motion. She exhibits no edema or deformity.  Neurological: She is alert and oriented to person, place, and time.  Skin: Skin is warm and dry. She is not diaphoretic.  Psychiatric: She has a normal mood and affect.    BP (!) 152/76 (BP Location: Right Arm, Patient Position: Sitting, Cuff Size: Normal)   Pulse 72   Temp 97.9 F (36.6 C) (Oral)   Resp 18   Wt (!) 308  lb (139.7 kg)   SpO2 98%   BMI 45.48 kg/m  Wt Readings from Last 3 Encounters:  07/13/17 (!) 308 lb (139.7 kg)  02/13/17 (!) 306 lb 12.8 oz (139.2 kg)  11/10/16 (!) 305 lb 6 oz (138.5 kg)   BP Readings from Last 3 Encounters:  07/13/17 (!) 152/76  02/13/17 (!) 142/82  11/10/16 122/80     Immunization History  Administered Date(s) Administered  . Td 12/29/2006  . Tdap 08/20/2012    Health Maintenance  Topic Date Due  . PAP SMEAR  11/14/2016  . FOOT EXAM  05/02/2017  .  PNEUMOCOCCAL POLYSACCHARIDE VACCINE (1) 08/07/2017 (Originally 07/22/1972)  . INFLUENZA VACCINE  03/29/2019 (Originally 07/29/2017)  . HEMOGLOBIN A1C  08/13/2017  . OPHTHALMOLOGY EXAM  04/13/2018  . TETANUS/TDAP  08/20/2022  . HIV Screening  Completed    Lab Results  Component Value Date   WBC 8.6 02/13/2017   HGB 12.9 02/13/2017   HCT 38.6 02/13/2017   PLT 307.0 02/13/2017   GLUCOSE 119 (H) 02/13/2017   CHOL 94 02/13/2017   TRIG 239.0 (H) 02/13/2017   HDL 20.30 (L) 02/13/2017   LDLDIRECT 41.0 02/13/2017   LDLCALC 47 11/10/2016   ALT 19 02/13/2017   AST 20 02/13/2017   NA 135 02/13/2017   K 4.2 02/13/2017   CL 101 02/13/2017   CREATININE 0.97 02/13/2017   BUN 19 02/13/2017   CO2 25 02/13/2017   TSH 2.86 02/13/2017   INR 1.03 03/04/2011   HGBA1C 8.2 (H) 02/13/2017   MICROALBUR 19.6 (H) 01/29/2016    Lab Results  Component Value Date   TSH 2.86 02/13/2017   Lab Results  Component Value Date   WBC 8.6 02/13/2017   HGB 12.9 02/13/2017   HCT 38.6 02/13/2017   MCV 88.6 02/13/2017   PLT 307.0 02/13/2017   Lab Results  Component Value Date   NA 135 02/13/2017   K 4.2 02/13/2017   CO2 25 02/13/2017   GLUCOSE 119 (H) 02/13/2017   BUN 19 02/13/2017   CREATININE 0.97 02/13/2017   BILITOT 0.5 02/13/2017   ALKPHOS 90 02/13/2017   AST 20 02/13/2017   ALT 19 02/13/2017   PROT 7.0 02/13/2017   ALBUMIN 3.8 02/13/2017   CALCIUM 9.1 02/13/2017   GFR 65.55 02/13/2017   Lab Results    Component Value Date   CHOL 94 02/13/2017   Lab Results  Component Value Date   HDL 20.30 (L) 02/13/2017   Lab Results  Component Value Date   LDLCALC 47 11/10/2016   Lab Results  Component Value Date   TRIG 239.0 (H) 02/13/2017   Lab Results  Component Value Date   CHOLHDL 5 02/13/2017   Lab Results  Component Value Date   HGBA1C 8.2 (H) 02/13/2017         Assessment & Plan:   Problem List Items Addressed This Visit    Diabetes mellitus type 2 in obese ([place])    hgba1c mildly elevated at last check, minimize simple carbs. Increase exercise as tolerated. Continue current meds. Check hgba1c today because she notes sugars running in the 200 and 300s lately. Change Metformin XR to 750 mg tabs 1 tid, continue Glipizide and consider Victoza      Relevant Medications   lisinopril (PRINIVIL,ZESTRIL) 20 MG tablet   LORazepam (ATIVAN) 0.5 MG tablet   atorvastatin (LIPITOR) 40 MG tablet   metFORMIN (GLUCOPHAGE XR) 750 MG 24 hr tablet   Other Relevant Orders   Hemoglobin A1c   Obesity    Encouraged DASH diet, decrease po intake and increase exercise as tolerated. Needs 7-8 hours of sleep nightly. Avoid trans fats, eat small, frequent meals every 4-5 hours with lean proteins, complex carbs and healthy fats. Minimize simple carbs, bariatric referral      Relevant Medications   lisinopril (PRINIVIL,ZESTRIL) 20 MG tablet   metFORMIN (GLUCOPHAGE XR) 750 MG 24 hr tablet   HTN (hypertension)    Poorly controlled will alter medications, encouraged DASH diet, minimize caffeine and obtain adequate sleep. Report concerning symptoms and follow up as directed and as needed  Relevant Medications   lisinopril (PRINIVIL,ZESTRIL) 20 MG tablet   LORazepam (ATIVAN) 0.5 MG tablet   metoprolol tartrate (LOPRESSOR) 50 MG tablet   atorvastatin (LIPITOR) 40 MG tablet   albuterol (PROVENTIL HFA;VENTOLIN HFA) 108 (90 Base) MCG/ACT inhaler   triamterene-hydrochlorothiazide (MAXZIDE-25)  37.5-25 MG tablet   Other Relevant Orders   CBC   TSH   Preventative health care   Relevant Medications   lisinopril (PRINIVIL,ZESTRIL) 20 MG tablet   LORazepam (ATIVAN) 0.5 MG tablet   Hyperlipidemia, mixed    Tolerating statin, encouraged heart healthy diet, avoid trans fats, minimize simple carbs and saturated fats. Increase exercise as tolerated      Relevant Medications   lisinopril (PRINIVIL,ZESTRIL) 20 MG tablet   metoprolol tartrate (LOPRESSOR) 50 MG tablet   atorvastatin (LIPITOR) 40 MG tablet   triamterene-hydrochlorothiazide (MAXZIDE-25) 37.5-25 MG tablet   Neuropathy    Increase gabapentin to 300 mg bid and 600 mg qhs      Muscle cramps - Primary    Hydrate, check electrolytes including magnesium, she does take a supplement      Relevant Orders   Magnesium   Comprehensive metabolic panel    Other Visit Diagnoses    Hyperlipidemia, unspecified hyperlipidemia type       Relevant Medications   lisinopril (PRINIVIL,ZESTRIL) 20 MG tablet   metoprolol tartrate (LOPRESSOR) 50 MG tablet   atorvastatin (LIPITOR) 40 MG tablet   triamterene-hydrochlorothiazide (MAXZIDE-25) 37.5-25 MG tablet   Other Relevant Orders   Lipid panel   Acute bronchitis, unspecified organism       Relevant Medications   metoprolol tartrate (LOPRESSOR) 50 MG tablet   albuterol (PROVENTIL HFA;VENTOLIN HFA) 108 (90 Base) MCG/ACT inhaler      I have discontinued April Warren's gabapentin, metFORMIN, and hydrochlorothiazide. I have also changed her lisinopril and metoprolol tartrate. Additionally, I am having her start on triamterene-hydrochlorothiazide, metFORMIN, and gabapentin. Lastly, I am having her maintain her ibuprofen, ONE TOUCH BASIC SYSTEM, glucose blood, ONE TOUCH LANCETS, glipiZIDE, LORazepam, atorvastatin, montelukast, and albuterol.  Meds ordered this encounter  Medications  . lisinopril (PRINIVIL,ZESTRIL) 20 MG tablet    Sig: Take 1 tablet (20 mg total) by mouth 2 (two) times daily.     Dispense:  180 tablet    Refill:  1    This prescription was filled on 06/18/2017. Any refills authorized will be placed on file.  Marland Kitchen LORazepam (ATIVAN) 0.5 MG tablet    Sig: Take 1 tablet by mouth 2 times a day as needed for anxiety or sleep    Dispense:  40 tablet    Refill:  2  . metoprolol tartrate (LOPRESSOR) 50 MG tablet    Sig: Take 1 tablet (50 mg total) by mouth 2 (two) times daily.    Dispense:  180 tablet    Refill:  1    This prescription was filled on 06/18/2017. Any refills authorized will be placed on file.  Marland Kitchen atorvastatin (LIPITOR) 40 MG tablet    Sig: Take 1 tablet (40 mg total) by mouth daily.    Dispense:  30 tablet    Refill:  5  . montelukast (SINGULAIR) 10 MG tablet    Sig: Take 1 tablet (10 mg total) by mouth at bedtime.    Dispense:  90 tablet    Refill:  1  . albuterol (PROVENTIL HFA;VENTOLIN HFA) 108 (90 Base) MCG/ACT inhaler    Sig: Inhale 2 puffs into the lungs every 6 (six) hours as needed for wheezing or shortness  of breath.    Dispense:  1 Inhaler    Refill:  5  . triamterene-hydrochlorothiazide (MAXZIDE-25) 37.5-25 MG tablet    Sig: Take 1 tablet by mouth daily.    Dispense:  90 tablet    Refill:  1  . metFORMIN (GLUCOPHAGE XR) 750 MG 24 hr tablet    Sig: Take 1 tablet (750 mg total) by mouth 3 (three) times daily.    Dispense:  180 tablet    Refill:  1  . gabapentin (NEURONTIN) 300 MG capsule    Sig: 1 tab po bid and 2 tabs po qhs    Dispense:  360 capsule    Refill:  1    CMA served as scribe during this visit. History, Physical and Plan performed by medical provider. Documentation and orders reviewed and attested to.  Penni Homans, MD

## 2017-07-14 ENCOUNTER — Other Ambulatory Visit: Payer: Self-pay | Admitting: Family Medicine

## 2017-07-14 ENCOUNTER — Telehealth: Payer: Self-pay | Admitting: Family Medicine

## 2017-07-14 MED ORDER — ATORVASTATIN CALCIUM 40 MG PO TABS: ORAL_TABLET | ORAL | 3 refills | Status: DC

## 2017-07-14 NOTE — Telephone Encounter (Signed)
Self.  Returning call for lab results  CB: [phone]

## 2017-07-14 NOTE — Telephone Encounter (Signed)
See result notes. 

## 2017-08-11 ENCOUNTER — Encounter

## 2017-08-12 ENCOUNTER — Encounter: Payer: Self-pay | Admitting: Family Medicine

## 2017-08-12 ENCOUNTER — Other Ambulatory Visit: Payer: Self-pay | Admitting: Family Medicine

## 2017-08-12 MED ORDER — FLUCONAZOLE 150 MG PO TABS: 150.0000 mg | ORAL_TABLET | Freq: Once | ORAL | 0 refills | Status: AC

## 2017-08-12 MED ORDER — AZITHROMYCIN 250 MG PO TABS: ORAL_TABLET | ORAL | 0 refills | Status: DC

## 2017-09-17 ENCOUNTER — Other Ambulatory Visit: Payer: Self-pay | Admitting: Family Medicine

## 2017-10-13 ENCOUNTER — Encounter: Payer: Self-pay | Admitting: Family Medicine

## 2017-10-13 ENCOUNTER — Ambulatory Visit (INDEPENDENT_AMBULATORY_CARE_PROVIDER_SITE_OTHER): Admitting: Family Medicine

## 2017-10-13 VITALS — BP 150/86 | HR 65 | Temp 98.0°F | Resp 18 | Wt 308.4 lb

## 2017-10-13 DIAGNOSIS — E782 Mixed hyperlipidemia: Secondary | ICD-10-CM | POA: Diagnosis not present

## 2017-10-13 DIAGNOSIS — Z0000 Encounter for general adult medical examination without abnormal findings: Secondary | ICD-10-CM

## 2017-10-13 DIAGNOSIS — I10 Essential (primary) hypertension: Secondary | ICD-10-CM | POA: Diagnosis not present

## 2017-10-13 DIAGNOSIS — E785 Hyperlipidemia, unspecified: Secondary | ICD-10-CM

## 2017-10-13 DIAGNOSIS — Z23 Encounter for immunization: Secondary | ICD-10-CM

## 2017-10-13 DIAGNOSIS — E669 Obesity, unspecified: Secondary | ICD-10-CM

## 2017-10-13 DIAGNOSIS — E1169 Type 2 diabetes mellitus with other specified complication: Secondary | ICD-10-CM | POA: Diagnosis not present

## 2017-10-13 DIAGNOSIS — J209 Acute bronchitis, unspecified: Secondary | ICD-10-CM | POA: Diagnosis not present

## 2017-10-13 LAB — COMPREHENSIVE METABOLIC PANEL
ALBUMIN: 3.7 g/dL (ref 3.5–5.2)
ALT: 12 U/L (ref 0–35)
AST: 14 U/L (ref 0–37)
Alkaline Phosphatase: 99 U/L (ref 39–117)
BUN: 14 mg/dL (ref 6–23)
CHLORIDE: 104 meq/L (ref 96–112)
CO2: 25 meq/L (ref 19–32)
Calcium: 8.9 mg/dL (ref 8.4–10.5)
Creatinine, Ser: 0.75 mg/dL (ref 0.40–1.20)
GFR: 87.95 mL/min (ref >60.00)
GLUCOSE: 124 mg/dL — AB (ref 70–99)
POTASSIUM: 4.7 meq/L (ref 3.5–5.1)
SODIUM: 136 meq/L (ref 135–145)
Total Bilirubin: 0.4 mg/dL (ref 0.2–1.2)
Total Protein: 6.8 g/dL (ref 6.0–8.3)

## 2017-10-13 LAB — CBC
HEMATOCRIT: 36.7 % (ref 36.0–46.0)
Hemoglobin: 12 g/dL (ref 12.0–15.0)
MCHC: 32.8 g/dL (ref 30.0–36.0)
MCV: 90.4 fl (ref 78.0–100.0)
Platelets: 313 10*3/uL (ref 150.0–400.0)
RBC: 4.06 Mil/uL (ref 3.87–5.11)
RDW: 13.4 % (ref 11.5–15.5)
WBC: 8.1 10*3/uL (ref 4.0–10.5)

## 2017-10-13 LAB — TSH: TSH: 3.74 u[IU]/mL (ref 0.35–4.50)

## 2017-10-13 LAB — LIPID PANEL
Cholesterol: 74 mg/dL (ref 0–200)
HDL: 21.5 mg/dL — AB (ref >39.00)
LDL Cholesterol: 13 mg/dL (ref 0–99)
NONHDL: 52.48
Total CHOL/HDL Ratio: 3
Triglycerides: 196 mg/dL — ABNORMAL HIGH (ref 0.0–149.0)
VLDL: 39.2 mg/dL (ref 0.0–40.0)

## 2017-10-13 LAB — HEMOGLOBIN A1C: HEMOGLOBIN A1C: 7.7 % — AB (ref 4.6–6.5)

## 2017-10-13 MED ORDER — MONTELUKAST SODIUM 10 MG PO TABS: 10.0000 mg | ORAL_TABLET | Freq: Every day | ORAL | 3 refills | Status: DC

## 2017-10-13 MED ORDER — GLUCOSE BLOOD VI STRP: ORAL_STRIP | 5 refills | Status: DC

## 2017-10-13 MED ORDER — LISINOPRIL 20 MG PO TABS: 20.0000 mg | ORAL_TABLET | Freq: Two times a day (BID) | ORAL | 3 refills | Status: DC

## 2017-10-13 MED ORDER — METOPROLOL TARTRATE 50 MG PO TABS: 50.0000 mg | ORAL_TABLET | Freq: Two times a day (BID) | ORAL | 3 refills | Status: DC

## 2017-10-13 MED ORDER — TRIAMTERENE-HCTZ 37.5-25 MG PO TABS: 1.0000 | ORAL_TABLET | Freq: Every day | ORAL | 3 refills | Status: DC

## 2017-10-13 MED ORDER — ATORVASTATIN CALCIUM 40 MG PO TABS: ORAL_TABLET | ORAL | 3 refills | Status: DC

## 2017-10-13 MED ORDER — GLIPIZIDE 10 MG PO TABS: 10.0000 mg | ORAL_TABLET | Freq: Two times a day (BID) | ORAL | 6 refills | Status: DC

## 2017-10-13 MED ORDER — ONETOUCH LANCETS MISC: 12 refills | Status: DC

## 2017-10-13 MED ORDER — METFORMIN HCL ER 750 MG PO TB24: 750.0000 mg | ORAL_TABLET | Freq: Three times a day (TID) | ORAL | 3 refills | Status: DC

## 2017-10-13 MED ORDER — GABAPENTIN 300 MG PO CAPS: ORAL_CAPSULE | ORAL | 3 refills | Status: DC

## 2017-10-13 NOTE — Assessment & Plan Note (Addendum)
hgba1c acceptable, minimize simple carbs. Increase exercise as tolerated. Highest in past month 158 after eating, 73 was lowest after skipping a meal. Normally stays between 80 to 150. Recheck today A1C. Has restarted metformin and her loose stool has returned but only once daily and tolerable

## 2017-10-13 NOTE — Assessment & Plan Note (Addendum)
Encouraged heart healthy diet, increase exercise, avoid trans fats, consider a krill oil cap daily. Tolerating Atorvasatin 

## 2017-10-13 NOTE — Progress Notes (Signed)
Subjective:  I acted as a Education administrator for Dr. Charlett Blake. [name], [place]  Patient ID: April Warren, female    DOB: [birth year], 46 y.o.   MRN: [id number]  No chief complaint on file.   HPI  Patient is in today for a follow up. She is following up on her HTN, DM, hyperlipidemia and other medical concerns. She presently has no acute concerns.  No recent febrile illness or acute hospitalizations. Denies CP/palp/SOB/HA/congestion/fevers/GI or GU c/o. Taking meds as prescribed. Has not contacted bariatric program but is still considering. Low sugar of 73 after skipping a meal and hi of 158, most are between 80 and 150. No polyuria or polydipsia. Denies CP/palp/SOB/HA/congestion/fevers/GI or GU c/o. Taking meds as prescribed   Patient Care Team: Mosie Lukes, MD as PCP - General (Family Medicine)   Past Medical History:  Diagnosis Date  . Allergy   . Chicken pox 46 yrs old  . Diabetes mellitus 08-12-12   type 2- dr Zigmund Daniel diagnosed  . Diabetic retinopathy ([place])   . Dysmenorrhea 08/20/2012  . HTN (hypertension) 08/20/2012  . Hyperlipidemia, mixed 08/28/2013  . Neuropathy 02/16/2017  . Obesity   . Obesity, unspecified 08/28/2013  . Other and unspecified hyperlipidemia 08/28/2013  . Preventative health care 08/20/2012  . Sinusitis 11/14/2013  . Sinusitis, acute 05/02/2016    Past Surgical History:  Procedure Laterality Date  . REFRACTIVE SURGERY  08-12-12   right, left on 09/16/12    Family History  Problem Relation Age of Onset  . Cancer Mother 50       breast  . Diabetes Mother        type 2  . Hypertension Mother   . Hypertension Father   . Hyperlipidemia Father   . COPD Father   . Hypertension Maternal Grandmother   . Hyperlipidemia Maternal Grandmother   . COPD Maternal Grandmother   . Asthma Maternal Grandmother   . Hypertension Maternal Grandfather   . Heart attack Maternal Grandfather   . Gout Brother   . Cancer Brother        lung  . Heart disease Paternal Grandfather      Social History   Social History  . Marital status: Married    Spouse name: N/A  . Number of children: N/A  . Years of education: N/A   Occupational History  . Not on file.   Social History Main Topics  . Smoking status: Never Smoker  . Smokeless tobacco: Never Used  . Alcohol use Yes     Comment: socially  . Drug use: No  . Sexual activity: Yes    Partners: Male   Other Topics Concern  . Not on file   Social History Narrative   Lives with husband.    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    Not exercising    Outpatient Medications Prior to Visit  Medication Sig Dispense Refill  . albuterol (PROVENTIL HFA;VENTOLIN HFA) 108 (90 Base) MCG/ACT inhaler Inhale 2 puffs into the lungs every 6 (six) hours as needed for wheezing or shortness of breath. 1 Inhaler 5  . azithromycin (ZITHROMAX) 250 MG tablet 2 tabs po once then 1 tab po daily x 4 days 6 tablet 0  . Blood Glucose Monitoring Suppl (ONE TOUCH BASIC SYSTEM) W/DEVICE KIT Test bid prn 1 each 0  . ibuprofen (ADVIL,MOTRIN) 200 MG tablet Take 400 mg by mouth every  8 (eight) hours as needed.    Marland Kitchen LORazepam (ATIVAN) 0.5 MG tablet Take 1 tablet by mouth 2 times a day as needed for anxiety or sleep 40 tablet 2  . atorvastatin (LIPITOR) 40 MG tablet Take 1 and 1/2 tablet by mouth daily 45 tablet 3  . gabapentin (NEURONTIN) 300 MG capsule 1 tab po bid and 2 tabs po qhs 360 capsule 1  . glipiZIDE (GLUCOTROL) 10 MG tablet Take 1 Tablet by mouth 2 times a day 60 tablet 6  . glucose blood (ONE TOUCH ULTRA TEST) test strip TEST TWICE A DAY DX E11.9 100 each 5  . lisinopril (PRINIVIL,ZESTRIL) 20 MG tablet Take 1 tablet (20 mg total) by mouth 2 (two) times daily. 180 tablet 1  . metFORMIN (GLUCOPHAGE-XR) 750 MG 24 hr tablet take 1  Tablet by mouth 3 times daily 180 tablet 0  . metoprolol tartrate (LOPRESSOR) 50 MG tablet Take 1 tablet  (50 mg total) by mouth 2 (two) times daily. 180 tablet 1  . montelukast (SINGULAIR) 10 MG tablet Take 1 tablet (10 mg total) by mouth at bedtime. 90 tablet 1  . ONE TOUCH LANCETS MISC Check two daily DX E11.9 200 each 12  . triamterene-hydrochlorothiazide (MAXZIDE-25) 37.5-25 MG tablet Take 1 tablet by mouth daily. 90 tablet 1   No facility-administered medications prior to visit.     No Known Allergies  Review of Systems  Constitutional: Negative for fever and malaise/fatigue.  HENT: Negative for congestion.   Eyes: Negative for blurred vision.  Respiratory: Negative for shortness of breath.   Cardiovascular: Negative for chest pain, palpitations and leg swelling.  Gastrointestinal: Positive for diarrhea. Negative for abdominal pain, blood in stool and nausea.  Genitourinary: Negative for dysuria and frequency.  Musculoskeletal: Negative for falls.  Skin: Negative for rash.  Neurological: Negative for dizziness, loss of consciousness and headaches.  Endo/Heme/Allergies: Negative for environmental allergies.  Psychiatric/Behavioral: Negative for depression. The patient is not nervous/anxious.        Objective:    Physical Exam  Constitutional: She is oriented to person, place, and time. She appears well-developed and well-nourished. No distress.  HENT:  Head: Normocephalic and atraumatic.  Nose: Nose normal.  Eyes: Right eye exhibits no discharge. Left eye exhibits no discharge.  Neck: Normal range of motion. Neck supple.  Cardiovascular: Normal rate and regular rhythm.   No murmur heard. Pulmonary/Chest: Effort normal and breath sounds normal.  Abdominal: Soft. Bowel sounds are normal. There is no tenderness.  Musculoskeletal: She exhibits no edema.  Neurological: She is alert and oriented to person, place, and time.  Skin: Skin is warm and dry.  Psychiatric: She has a normal mood and affect.  Nursing note and vitals reviewed.   BP (!) 150/86 (BP Location: Left Arm,  Patient Position: Sitting, Cuff Size: Normal)   Pulse 65   Temp 98 F (36.7 C) (Oral)   Resp 18   Wt (!) 308 lb 6.4 oz (139.9 kg)   SpO2 98%   BMI 45.54 kg/m  Wt Readings from Last 3 Encounters:  10/13/17 (!) 308 lb 6.4 oz (139.9 kg)  07/13/17 (!) 308 lb (139.7 kg)  02/13/17 (!) 306 lb 12.8 oz (139.2 kg)   BP Readings from Last 3 Encounters:  10/13/17 (!) 150/86  07/13/17 (!) 152/76  02/13/17 (!) 142/82     Immunization History  Administered Date(s) Administered  . Td 12/29/2006  . Tdap 08/20/2012    Health Maintenance  Topic Date Due  .  PNEUMOCOCCAL POLYSACCHARIDE VACCINE (1) 07/22/1972  . PAP SMEAR  11/14/2016  . FOOT EXAM  05/02/2017  . INFLUENZA VACCINE  03/29/2019 (Originally 07/29/2017)  . HEMOGLOBIN A1C  01/13/2018  . OPHTHALMOLOGY EXAM  04/13/2018  . TETANUS/TDAP  08/20/2022  . HIV Screening  Completed    Lab Results  Component Value Date   WBC 8.6 07/13/2017   HGB 12.9 07/13/2017   HCT 38.1 07/13/2017   PLT 309.0 07/13/2017   GLUCOSE 205 (H) 07/13/2017   CHOL 87 07/13/2017   TRIG (H) 07/13/2017    506.0 Triglyceride is over 400; calculations on Lipids are invalid.   HDL 18.60 (L) 07/13/2017   LDLDIRECT 34.0 07/13/2017   LDLCALC 47 11/10/2016   ALT 12 07/13/2017   AST 14 07/13/2017   NA 136 07/13/2017   K 4.5 07/13/2017   CL 100 07/13/2017   CREATININE 0.80 07/13/2017   BUN 19 07/13/2017   CO2 27 07/13/2017   TSH 2.60 07/13/2017   INR 1.03 03/04/2011   HGBA1C 8.4 (H) 07/13/2017   MICROALBUR 19.6 (H) 01/29/2016    Lab Results  Component Value Date   TSH 2.60 07/13/2017   Lab Results  Component Value Date   WBC 8.6 07/13/2017   HGB 12.9 07/13/2017   HCT 38.1 07/13/2017   MCV 90.3 07/13/2017   PLT 309.0 07/13/2017   Lab Results  Component Value Date   NA 136 07/13/2017   K 4.5 07/13/2017   CO2 27 07/13/2017   GLUCOSE 205 (H) 07/13/2017   BUN 19 07/13/2017   CREATININE 0.80 07/13/2017   BILITOT 0.4 07/13/2017   ALKPHOS 106  07/13/2017   AST 14 07/13/2017   ALT 12 07/13/2017   PROT 6.7 07/13/2017   ALBUMIN 3.5 07/13/2017   CALCIUM 8.3 (L) 07/13/2017   GFR 81.73 07/13/2017   Lab Results  Component Value Date   CHOL 87 07/13/2017   Lab Results  Component Value Date   HDL 18.60 (L) 07/13/2017   Lab Results  Component Value Date   LDLCALC 47 11/10/2016   Lab Results  Component Value Date   TRIG (H) 07/13/2017    506.0 Triglyceride is over 400; calculations on Lipids are invalid.   Lab Results  Component Value Date   CHOLHDL 5 07/13/2017   Lab Results  Component Value Date   HGBA1C 8.4 (H) 07/13/2017         Assessment & Plan:   Problem List Items Addressed This Visit    Diabetes mellitus type 2 in obese ([place])    hgba1c acceptable, minimize simple carbs. Increase exercise as tolerated. Highest in past month 158 after eating, 73 was lowest after skipping a meal. Normally stays between 80 to 150. Recheck today A1C. Has restarted metformin and her loose stool has returned but only once daily and tolerable      Relevant Medications   glipiZIDE (GLUCOTROL) 10 MG tablet   atorvastatin (LIPITOR) 40 MG tablet   glucose blood (ONE TOUCH ULTRA TEST) test strip   ONE TOUCH LANCETS MISC   metFORMIN (GLUCOPHAGE-XR) 750 MG 24 hr tablet   lisinopril (PRINIVIL,ZESTRIL) 20 MG tablet   Other Relevant Orders   Hemoglobin A1c   Obesity    Encouraged DASH diet, decrease po intake and increase exercise as tolerated. Needs 7-8 hours of sleep nightly. Avoid trans fats, eat small, frequent meals every 4-5 hours with lean proteins, complex carbs and healthy fats. Minimize simple carbs      Relevant Medications   glipiZIDE (  GLUCOTROL) 10 MG tablet   glucose blood (ONE TOUCH ULTRA TEST) test strip   metFORMIN (GLUCOPHAGE-XR) 750 MG 24 hr tablet   lisinopril (PRINIVIL,ZESTRIL) 20 MG tablet   HTN (hypertension)    Well controlled, no changes to meds. Encouraged heart healthy diet such as the DASH diet and  exercise as tolerated.       Relevant Medications   atorvastatin (LIPITOR) 40 MG tablet   triamterene-hydrochlorothiazide (MAXZIDE-25) 37.5-25 MG tablet   glucose blood (ONE TOUCH ULTRA TEST) test strip   ONE TOUCH LANCETS MISC   metoprolol tartrate (LOPRESSOR) 50 MG tablet   lisinopril (PRINIVIL,ZESTRIL) 20 MG tablet   Other Relevant Orders   CBC   Comprehensive metabolic panel   TSH   Preventative health care   Relevant Medications   glucose blood (ONE TOUCH ULTRA TEST) test strip   ONE TOUCH LANCETS MISC   lisinopril (PRINIVIL,ZESTRIL) 20 MG tablet   Hyperlipidemia, mixed    Encouraged heart healthy diet, increase exercise, avoid trans fats, consider a krill oil cap daily. Tolerating Atorvasatin      Relevant Medications   atorvastatin (LIPITOR) 40 MG tablet   triamterene-hydrochlorothiazide (MAXZIDE-25) 37.5-25 MG tablet   metoprolol tartrate (LOPRESSOR) 50 MG tablet   lisinopril (PRINIVIL,ZESTRIL) 20 MG tablet    Other Visit Diagnoses    Hyperlipidemia, unspecified hyperlipidemia type       Relevant Medications   atorvastatin (LIPITOR) 40 MG tablet   triamterene-hydrochlorothiazide (MAXZIDE-25) 37.5-25 MG tablet   glucose blood (ONE TOUCH ULTRA TEST) test strip   metoprolol tartrate (LOPRESSOR) 50 MG tablet   lisinopril (PRINIVIL,ZESTRIL) 20 MG tablet   Other Relevant Orders   Lipid panel   Acute bronchitis, unspecified organism       Relevant Medications   metoprolol tartrate (LOPRESSOR) 50 MG tablet      I have changed Ms. Deshazer's glipiZIDE and metFORMIN. I am also having her maintain her ibuprofen, ONE TOUCH BASIC SYSTEM, LORazepam, albuterol, azithromycin, montelukast, atorvastatin, triamterene-hydrochlorothiazide, glucose blood, ONE TOUCH LANCETS, metoprolol tartrate, gabapentin, and lisinopril.  Meds ordered this encounter  Medications  . glipiZIDE (GLUCOTROL) 10 MG tablet    Sig: Take 1 tablet (10 mg total) by mouth 2 (two) times daily.    Dispense:  60  tablet    Refill:  6    This prescription was filled on 05/19/2017. Any refills authorized will be placed on file.  . montelukast (SINGULAIR) 10 MG tablet    Sig: Take 1 tablet (10 mg total) by mouth at bedtime.    Dispense:  30 tablet    Refill:  3  . atorvastatin (LIPITOR) 40 MG tablet    Sig: Take 1 and 1/2 tablet by mouth daily    Dispense:  45 tablet    Refill:  3  . triamterene-hydrochlorothiazide (MAXZIDE-25) 37.5-25 MG tablet    Sig: Take 1 tablet by mouth daily.    Dispense:  30 tablet    Refill:  3  . glucose blood (ONE TOUCH ULTRA TEST) test strip    Sig: TEST TWICE A DAY DX E11.9    Dispense:  100 each    Refill:  5    D/C PREVIOUS SCRIPTS FOR THIS MEDICATION  . ONE TOUCH LANCETS MISC    Sig: Check two daily DX E11.9    Dispense:  200 each    Refill:  12  . metFORMIN (GLUCOPHAGE-XR) 750 MG 24 hr tablet    Sig: Take 1 tablet (750 mg total) by mouth 3 (three)  times daily.    Dispense:  90 tablet    Refill:  3    This prescription was filled on 09/17/2017. Any refills authorized will be placed on file.  . metoprolol tartrate (LOPRESSOR) 50 MG tablet    Sig: Take 1 tablet (50 mg total) by mouth 2 (two) times daily.    Dispense:  60 tablet    Refill:  3    This prescription was filled on 06/18/2017. Any refills authorized will be placed on file.  . gabapentin (NEURONTIN) 300 MG capsule    Sig: 1 tab po bid and 2 tabs po qhs    Dispense:  120 capsule    Refill:  3  . lisinopril (PRINIVIL,ZESTRIL) 20 MG tablet    Sig: Take 1 tablet (20 mg total) by mouth 2 (two) times daily.    Dispense:  60 tablet    Refill:  3    This prescription was filled on 06/18/2017. Any refills authorized will be placed on file.    CMA served as Education administrator during this visit. History, Physical and Plan performed by medical provider. Documentation and orders reviewed and attested to.  Penni Homans, MD

## 2017-10-13 NOTE — Assessment & Plan Note (Signed)
Well controlled, no changes to meds. Encouraged heart healthy diet such as the DASH diet and exercise as tolerated.  °

## 2017-10-13 NOTE — Patient Instructions (Signed)

## 2017-10-13 NOTE — Assessment & Plan Note (Signed)
Encouraged DASH diet, decrease po intake and increase exercise as tolerated. Needs 7-8 hours of sleep nightly. Avoid trans fats, eat small, frequent meals every 4-5 hours with lean proteins, complex carbs and healthy fats. Minimize simple carbs 

## 2017-10-14 ENCOUNTER — Telehealth: Payer: Self-pay

## 2017-10-14 MED ORDER — GLUCOSE BLOOD VI STRP: ORAL_STRIP | 5 refills | Status: DC

## 2017-10-14 MED ORDER — FREESTYLE LANCETS MISC: 5 refills | Status: DC

## 2017-10-14 MED ORDER — FREESTYLE FREEDOM LITE W/DEVICE KIT: PACK | 0 refills | Status: AC

## 2017-10-14 NOTE — Telephone Encounter (Signed)
Pt's insurance does not cover [employer] branded diabetic supplies. They do cover Freestyle or precision. Will send new meter and supplies to pharmacy.

## 2017-10-14 NOTE — Telephone Encounter (Signed)
Freestyle diabetic meter, test strips and lancets sent to [facility].

## 2017-10-17 ENCOUNTER — Other Ambulatory Visit: Payer: Self-pay | Admitting: Family Medicine

## 2017-10-26 ENCOUNTER — Other Ambulatory Visit: Payer: Self-pay | Admitting: Family Medicine

## 2017-10-27 ENCOUNTER — Other Ambulatory Visit: Payer: Self-pay

## 2017-10-30 ENCOUNTER — Other Ambulatory Visit: Payer: Self-pay | Admitting: Family Medicine

## 2017-10-30 DIAGNOSIS — J209 Acute bronchitis, unspecified: Secondary | ICD-10-CM

## 2017-10-30 DIAGNOSIS — I10 Essential (primary) hypertension: Secondary | ICD-10-CM

## 2017-11-13 ENCOUNTER — Encounter: Payer: Self-pay | Admitting: Family Medicine

## 2017-11-13 ENCOUNTER — Telehealth: Payer: Self-pay | Admitting: Family Medicine

## 2017-11-13 DIAGNOSIS — I10 Essential (primary) hypertension: Secondary | ICD-10-CM

## 2017-11-13 DIAGNOSIS — E669 Obesity, unspecified: Secondary | ICD-10-CM

## 2017-11-13 DIAGNOSIS — Z0000 Encounter for general adult medical examination without abnormal findings: Secondary | ICD-10-CM

## 2017-11-13 DIAGNOSIS — E1169 Type 2 diabetes mellitus with other specified complication: Secondary | ICD-10-CM

## 2017-11-13 DIAGNOSIS — E785 Hyperlipidemia, unspecified: Secondary | ICD-10-CM

## 2017-11-13 DIAGNOSIS — J209 Acute bronchitis, unspecified: Secondary | ICD-10-CM

## 2017-11-13 NOTE — Telephone Encounter (Signed)
Patient called and stts her Pharmacy is going out of business and she is going to now and futher use [facility]". Patient would like a call back to explain in detail and get RX transferred. Please call patient back @ [phone].

## 2017-11-16 MED ORDER — ATORVASTATIN CALCIUM 40 MG PO TABS: ORAL_TABLET | ORAL | 1 refills | Status: DC

## 2017-11-16 MED ORDER — GLIPIZIDE 10 MG PO TABS: 10.0000 mg | ORAL_TABLET | Freq: Two times a day (BID) | ORAL | 1 refills | Status: DC

## 2017-11-16 MED ORDER — METOPROLOL TARTRATE 50 MG PO TABS: 50.0000 mg | ORAL_TABLET | Freq: Two times a day (BID) | ORAL | 1 refills | Status: DC

## 2017-11-16 MED ORDER — METFORMIN HCL ER 750 MG PO TB24: 750.0000 mg | ORAL_TABLET | Freq: Three times a day (TID) | ORAL | 1 refills | Status: DC

## 2017-11-16 MED ORDER — GABAPENTIN 300 MG PO CAPS: ORAL_CAPSULE | ORAL | 1 refills | Status: DC

## 2017-11-16 MED ORDER — LISINOPRIL 20 MG PO TABS: 20.0000 mg | ORAL_TABLET | Freq: Two times a day (BID) | ORAL | 1 refills | Status: DC

## 2017-11-16 MED ORDER — TRIAMTERENE-HCTZ 37.5-25 MG PO TABS: 1.0000 | ORAL_TABLET | Freq: Every day | ORAL | 1 refills | Status: DC

## 2017-11-16 NOTE — Telephone Encounter (Signed)
Medication sent to pharmacy  

## 2017-11-17 ENCOUNTER — Telehealth: Payer: Self-pay | Admitting: Family Medicine

## 2017-11-17 MED ORDER — FREESTYLE LANCETS MISC: 5 refills | Status: AC

## 2017-11-17 MED ORDER — METFORMIN HCL ER 750 MG PO TB24: 750.0000 mg | ORAL_TABLET | Freq: Three times a day (TID) | ORAL | 1 refills | Status: DC

## 2017-11-17 NOTE — Telephone Encounter (Signed)
Copied from CRM [id number]. Topic: General - Other >> Nov 17, 2017 12:57 PM Harrald, Kathy J wrote: Reason for CRM:  pt wants to know if she should also be taking the hydrochlorothiazide (HYDRODIURIL) 25 MG tablet Pt tried to refill but the pharmacy would not because she is on another type of med that is about the same.  Wants to know if she should indeed not be taking this med  

## 2017-11-17 NOTE — Telephone Encounter (Signed)
That is correct she should not take HCTZ it is already out of her list she was switchted to triamterene/hctz combo

## 2017-11-17 NOTE — Telephone Encounter (Signed)
Copied from CRM [phone]. Topic: General - Other >> Nov 17, 2017 12:57 PM April Warren, April Warren wrote: Reason for CRM:  pt wants to know if she should also be taking the hydrochlorothiazide (HYDRODIURIL) 25 MG tablet Pt tried to refill but the pharmacy would not because she is on another type of med that is about the same.  Wants to know if she should indeed not be taking this med

## 2017-11-17 NOTE — Telephone Encounter (Signed)
Patient informed of PCP instructions. While on the phone also sent in a 90 day refill for her lancets and metformin

## 2017-11-18 NOTE — Telephone Encounter (Signed)
Medications sent on 11/16/2017. See telephone note 11/17/2017 regarding below.

## 2018-01-14 ENCOUNTER — Ambulatory Visit: Admitting: Family Medicine

## 2018-02-12 ENCOUNTER — Encounter: Payer: Self-pay | Admitting: Family Medicine

## 2018-02-12 ENCOUNTER — Ambulatory Visit (HOSPITAL_BASED_OUTPATIENT_CLINIC_OR_DEPARTMENT_OTHER)
Admission: RE | Admit: 2018-02-12 | Discharge: 2018-02-12 | Disposition: A | Discharge status: Home / Self Care | Source: Ambulatory Visit | Attending: Family Medicine | Admitting: Family Medicine

## 2018-02-12 ENCOUNTER — Ambulatory Visit (INDEPENDENT_AMBULATORY_CARE_PROVIDER_SITE_OTHER): Admitting: Family Medicine

## 2018-02-12 VITALS — BP 142/88 | HR 71 | Temp 97.7°F | Resp 18 | Wt 310.4 lb

## 2018-02-12 DIAGNOSIS — I10 Essential (primary) hypertension: Secondary | ICD-10-CM

## 2018-02-12 DIAGNOSIS — E785 Hyperlipidemia, unspecified: Secondary | ICD-10-CM | POA: Diagnosis not present

## 2018-02-12 DIAGNOSIS — F419 Anxiety disorder, unspecified: Secondary | ICD-10-CM

## 2018-02-12 DIAGNOSIS — T7840XD Allergy, unspecified, subsequent encounter: Secondary | ICD-10-CM

## 2018-02-12 DIAGNOSIS — E669 Obesity, unspecified: Secondary | ICD-10-CM

## 2018-02-12 DIAGNOSIS — Z1231 Encounter for screening mammogram for malignant neoplasm of breast: Secondary | ICD-10-CM | POA: Insufficient documentation

## 2018-02-12 DIAGNOSIS — Z79899 Other long term (current) drug therapy: Secondary | ICD-10-CM

## 2018-02-12 DIAGNOSIS — Z1239 Encounter for other screening for malignant neoplasm of breast: Secondary | ICD-10-CM

## 2018-02-12 DIAGNOSIS — E782 Mixed hyperlipidemia: Secondary | ICD-10-CM | POA: Diagnosis not present

## 2018-02-12 DIAGNOSIS — R252 Cramp and spasm: Secondary | ICD-10-CM

## 2018-02-12 DIAGNOSIS — E1169 Type 2 diabetes mellitus with other specified complication: Secondary | ICD-10-CM | POA: Diagnosis not present

## 2018-02-12 DIAGNOSIS — Z0000 Encounter for general adult medical examination without abnormal findings: Secondary | ICD-10-CM

## 2018-02-12 DIAGNOSIS — J209 Acute bronchitis, unspecified: Secondary | ICD-10-CM | POA: Diagnosis not present

## 2018-02-12 LAB — COMPREHENSIVE METABOLIC PANEL
ALBUMIN: 3.7 g/dL (ref 3.5–5.2)
ALT: 15 U/L (ref 0–35)
AST: 17 U/L (ref 0–37)
Alkaline Phosphatase: 98 U/L (ref 39–117)
BILIRUBIN TOTAL: 0.6 mg/dL (ref 0.2–1.2)
BUN: 20 mg/dL (ref 6–23)
CALCIUM: 9 mg/dL (ref 8.4–10.5)
CO2: 29 meq/L (ref 19–32)
CREATININE: 0.88 mg/dL (ref 0.40–1.20)
Chloride: 100 mEq/L (ref 96–112)
GFR: 73.03 mL/min (ref >60.00)
Glucose, Bld: 186 mg/dL — ABNORMAL HIGH (ref 70–99)
Potassium: 4.7 mEq/L (ref 3.5–5.1)
Sodium: 136 mEq/L (ref 135–145)
TOTAL PROTEIN: 6.9 g/dL (ref 6.0–8.3)

## 2018-02-12 LAB — LIPID PANEL
CHOLESTEROL: 109 mg/dL (ref 0–200)
HDL: 21.3 mg/dL — AB (ref >39.00)
NONHDL: 87.21
TRIGLYCERIDES: 334 mg/dL — AB (ref 0.0–149.0)
Total CHOL/HDL Ratio: 5
VLDL: 66.8 mg/dL — ABNORMAL HIGH (ref 0.0–40.0)

## 2018-02-12 LAB — CBC
HCT: 36 % (ref 36.0–46.0)
HEMOGLOBIN: 11.9 g/dL — AB (ref 12.0–15.0)
MCHC: 33 g/dL (ref 30.0–36.0)
MCV: 88.6 fl (ref 78.0–100.0)
PLATELETS: 316 10*3/uL (ref 150.0–400.0)
RBC: 4.06 Mil/uL (ref 3.87–5.11)
RDW: 13.8 % (ref 11.5–15.5)
WBC: 8 10*3/uL (ref 4.0–10.5)

## 2018-02-12 LAB — TSH: TSH: 3.57 u[IU]/mL (ref 0.35–4.50)

## 2018-02-12 LAB — HEMOGLOBIN A1C: HEMOGLOBIN A1C: 8.5 % — AB (ref 4.6–6.5)

## 2018-02-12 LAB — MAGNESIUM: Magnesium: 1.5 mg/dL (ref 1.5–2.5)

## 2018-02-12 LAB — LDL CHOLESTEROL, DIRECT: Direct LDL: 45 mg/dL

## 2018-02-12 MED ORDER — METOPROLOL TARTRATE 50 MG PO TABS: 50.0000 mg | ORAL_TABLET | Freq: Two times a day (BID) | ORAL | 1 refills | Status: DC

## 2018-02-12 MED ORDER — AMOXICILLIN-POT CLAVULANATE 875-125 MG PO TABS
1.0000 | ORAL_TABLET | Freq: Two times a day (BID) | ORAL | 0 refills | Status: DC
Start: 2018-02-12 — End: 2018-05-20

## 2018-02-12 MED ORDER — MONTELUKAST SODIUM 10 MG PO TABS: 10.0000 mg | ORAL_TABLET | Freq: Every day | ORAL | 3 refills | Status: DC

## 2018-02-12 MED ORDER — TRIAMTERENE-HCTZ 37.5-25 MG PO TABS: 1.0000 | ORAL_TABLET | Freq: Every day | ORAL | 1 refills | Status: DC

## 2018-02-12 MED ORDER — LISINOPRIL 20 MG PO TABS: 20.0000 mg | ORAL_TABLET | Freq: Two times a day (BID) | ORAL | 1 refills | Status: DC

## 2018-02-12 MED ORDER — ATORVASTATIN CALCIUM 40 MG PO TABS: ORAL_TABLET | ORAL | 1 refills | Status: DC

## 2018-02-12 MED ORDER — METFORMIN HCL ER 750 MG PO TB24: 750.0000 mg | ORAL_TABLET | Freq: Three times a day (TID) | ORAL | 1 refills | Status: DC

## 2018-02-12 MED ORDER — GLUCOSE BLOOD VI STRP: ORAL_STRIP | 12 refills | Status: AC

## 2018-02-12 MED ORDER — GLIPIZIDE 10 MG PO TABS: 10.0000 mg | ORAL_TABLET | Freq: Two times a day (BID) | ORAL | 1 refills | Status: DC

## 2018-02-12 MED ORDER — LORAZEPAM 0.5 MG PO TABS: ORAL_TABLET | ORAL | 2 refills | Status: DC

## 2018-02-12 MED ORDER — GABAPENTIN 300 MG PO CAPS: ORAL_CAPSULE | ORAL | 1 refills | Status: DC

## 2018-02-12 NOTE — Progress Notes (Signed)
Subjective:  I acted as a Education administrator for Dr. Charlett Blake. [name], [place]  Patient ID: April Warren, female    DOB: 1971/01/04, 47 y.o.   MRN: [id number]  No chief complaint on file.   HPI  Patient is in today for 3 month follow up and she is tired but feeling well. They have just adopted a premie baby and they are not getting enough sleep or exercise but she is managing OK. No recent febrile illness or hospitalizations. No recent febrile illness or hospitalizations. Denies CP/palp/SOB/HA/congestion/fevers/GI or GU c/o. Taking meds as prescribed  Patient Care Team: Mosie Lukes, MD as PCP - General (Family Medicine)   Past Medical History:  Diagnosis Date  . Allergy   . Chicken pox 47 yrs old  . Diabetes mellitus 08-12-12   type 2- dr Zigmund Daniel diagnosed  . Diabetic retinopathy ([place])   . Dysmenorrhea 08/20/2012  . HTN (hypertension) 08/20/2012  . Hyperlipidemia, mixed 08/28/2013  . Neuropathy 02/16/2017  . Obesity   . Obesity, unspecified 08/28/2013  . Other and unspecified hyperlipidemia 08/28/2013  . Preventative health care 08/20/2012  . Sinusitis 11/14/2013  . Sinusitis, acute 05/02/2016    Past Surgical History:  Procedure Laterality Date  . REFRACTIVE SURGERY  08-12-12   right, left on 09/16/12    Family History  Problem Relation Age of Onset  . Cancer Mother 50       breast  . Diabetes Mother        type 2  . Hypertension Mother   . Hypertension Father   . Hyperlipidemia Father   . COPD Father   . Hypertension Maternal Grandmother   . Hyperlipidemia Maternal Grandmother   . COPD Maternal Grandmother   . Asthma Maternal Grandmother   . Hypertension Maternal Grandfather   . Heart attack Maternal Grandfather   . Gout Brother   . Cancer Brother        lung  . Heart disease Paternal Grandfather     Social History   Socioeconomic History  . Marital status: Married    Spouse name: Not on file  . Number of children: Not on file  . Years of education: Not on file  .  Highest education level: Not on file  Social Needs  . Financial resource strain: Not on file  . Food insecurity - worry: Not on file  . Food insecurity - inability: Not on file  . Transportation needs - medical: Not on file  . Transportation needs - non-medical: Not on file  Occupational History  . Not on file  Tobacco Use  . Smoking status: Never Smoker  . Smokeless tobacco: Never Used  Substance and Sexual Activity  . Alcohol use: Yes    Comment: socially  . Drug use: No  . Sexual activity: Yes    Partners: Male  Other Topics Concern  . Not on file  Social History Narrative   Lives with husband.    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    Not exercising    Outpatient Medications Prior to Visit  Medication Sig Dispense Refill  . albuterol (PROVENTIL HFA;VENTOLIN HFA) 108 (90 Base) MCG/ACT inhaler Inhale 2 puffs into the lungs every 6 (six) hours as needed for wheezing or shortness of breath. 1 Inhaler 5  . Blood Glucose Monitoring Suppl (FREESTYLE FREEDOM LITE) w/Device KIT Check blood sugar twice daily 1 each 0  .  ibuprofen (ADVIL,MOTRIN) 200 MG tablet Take 400 mg by mouth every 8 (eight) hours as needed.    . Lancets (FREESTYLE) lancets Check blood sugar twice daily 200 each 5  . atorvastatin (LIPITOR) 40 MG tablet Take 1 and 1/2 tablet by mouth daily 135 tablet 1  . azithromycin (ZITHROMAX) 250 MG tablet 2 tabs po once then 1 tab po daily x 4 days 6 tablet 0  . gabapentin (NEURONTIN) 300 MG capsule 1 tab po bid and 2 tabs po qhs 360 capsule 1  . glipiZIDE (GLUCOTROL) 10 MG tablet Take 1 tablet (10 mg total) 2 (two) times daily by mouth. 180 tablet 1  . glucose blood (FREESTYLE TEST STRIPS) test strip Check blood sugar twice daily 200 each 5  . lisinopril (PRINIVIL,ZESTRIL) 20 MG tablet Take 1 tablet (20 mg total) 2 (two) times daily by mouth. 180 tablet 1  . LORazepam  (ATIVAN) 0.5 MG tablet Take 1 tablet by mouth 2 times a day as needed for anxiety or sleep 40 tablet 2  . metFORMIN (GLUCOPHAGE-XR) 750 MG 24 hr tablet Take 1 tablet (750 mg total) by mouth 3 (three) times daily. 270 tablet 1  . metoprolol tartrate (LOPRESSOR) 50 MG tablet Take 1 tablet (50 mg total) 2 (two) times daily by mouth. 180 tablet 1  . montelukast (SINGULAIR) 10 MG tablet Take 1 tablet (10 mg total) by mouth at bedtime. 30 tablet 3  . triamterene-hydrochlorothiazide (MAXZIDE-25) 37.5-25 MG tablet Take 1 tablet daily by mouth. 90 tablet 1   No facility-administered medications prior to visit.     No Known Allergies  Review of Systems  Constitutional: Negative for fever and malaise/fatigue.  HENT: Negative for congestion.   Eyes: Negative for blurred vision.  Respiratory: Negative for shortness of breath.   Cardiovascular: Negative for chest pain, palpitations and leg swelling.  Gastrointestinal: Negative for abdominal pain, blood in stool and nausea.  Genitourinary: Negative for dysuria and frequency.  Musculoskeletal: Negative for falls.  Skin: Negative for rash.  Neurological: Negative for dizziness, loss of consciousness and headaches.  Endo/Heme/Allergies: Negative for environmental allergies.  Psychiatric/Behavioral: Negative for depression. The patient is not nervous/anxious.        Objective:    Physical Exam  Constitutional: She is oriented to person, place, and time. She appears well-developed and well-nourished. No distress.  HENT:  Head: Normocephalic and atraumatic.  Nose: Nose normal.  Eyes: Right eye exhibits no discharge. Left eye exhibits no discharge.  Neck: Normal range of motion. Neck supple.  Cardiovascular: Normal rate and regular rhythm.  No murmur heard. Pulmonary/Chest: Effort normal and breath sounds normal.  Abdominal: Soft. Bowel sounds are normal. There is no tenderness.  Musculoskeletal: She exhibits no edema.  Neurological: She is alert  and oriented to person, place, and time.  Skin: Skin is warm and dry.  Psychiatric: She has a normal mood and affect.  Nursing note and vitals reviewed.   BP (!) 142/88 (BP Location: Left Arm, Patient Position: Sitting, Cuff Size: Normal)   Pulse 71   Temp 97.7 F (36.5 C) (Oral)   Resp 18   Wt (!) 310 lb 6.4 oz (140.8 kg)   SpO2 96%   BMI 45.84 kg/m  Wt Readings from Last 3 Encounters:  02/12/18 (!) 310 lb 6.4 oz (140.8 kg)  10/13/17 (!) 308 lb 6.4 oz (139.9 kg)  07/13/17 (!) 308 lb (139.7 kg)   BP Readings from Last 3 Encounters:  02/12/18 (!) 142/88  10/13/17 (!) 150/86  07/13/17 (!) 152/76     Immunization History  Administered Date(s) Administered  . Influenza,inj,Quad PF,6+ Mos 10/13/2017  . Td 12/29/2006  . Tdap 08/20/2012    Health Maintenance  Topic Date Due  . PNEUMOCOCCAL POLYSACCHARIDE VACCINE (1) 07/22/1972  . PAP SMEAR  11/14/2016  . FOOT EXAM  05/02/2017  . OPHTHALMOLOGY EXAM  04/13/2018  . HEMOGLOBIN A1C  08/12/2018  . TETANUS/TDAP  08/20/2022  . INFLUENZA VACCINE  Completed  . HIV Screening  Completed    Lab Results  Component Value Date   WBC 8.0 02/12/2018   HGB 11.9 (L) 02/12/2018   HCT 36.0 02/12/2018   PLT 316.0 02/12/2018   GLUCOSE 186 (H) 02/12/2018   CHOL 109 02/12/2018   TRIG 334.0 (H) 02/12/2018   HDL 21.30 (L) 02/12/2018   LDLDIRECT 45.0 02/12/2018   LDLCALC 13 10/13/2017   ALT 15 02/12/2018   AST 17 02/12/2018   NA 136 02/12/2018   K 4.7 02/12/2018   CL 100 02/12/2018   CREATININE 0.88 02/12/2018   BUN 20 02/12/2018   CO2 29 02/12/2018   TSH 3.57 02/12/2018   INR 1.03 03/04/2011   HGBA1C 8.5 (H) 02/12/2018   MICROALBUR 19.6 (H) 01/29/2016    Lab Results  Component Value Date   TSH 3.57 02/12/2018   Lab Results  Component Value Date   WBC 8.0 02/12/2018   HGB 11.9 (L) 02/12/2018   HCT 36.0 02/12/2018   MCV 88.6 02/12/2018   PLT 316.0 02/12/2018   Lab Results  Component Value Date   NA 136 02/12/2018   K  4.7 02/12/2018   CO2 29 02/12/2018   GLUCOSE 186 (H) 02/12/2018   BUN 20 02/12/2018   CREATININE 0.88 02/12/2018   BILITOT 0.6 02/12/2018   ALKPHOS 98 02/12/2018   AST 17 02/12/2018   ALT 15 02/12/2018   PROT 6.9 02/12/2018   ALBUMIN 3.7 02/12/2018   CALCIUM 9.0 02/12/2018   GFR 73.03 02/12/2018   Lab Results  Component Value Date   CHOL 109 02/12/2018   Lab Results  Component Value Date   HDL 21.30 (L) 02/12/2018   Lab Results  Component Value Date   LDLCALC 13 10/13/2017   Lab Results  Component Value Date   TRIG 334.0 (H) 02/12/2018   Lab Results  Component Value Date   CHOLHDL 5 02/12/2018   Lab Results  Component Value Date   HGBA1C 8.5 (H) 02/12/2018         Assessment & Plan:   Problem List Items Addressed This Visit    Diabetes mellitus type 2 in obese ([place])    hgba1c acceptable, minimize simple carbs. Increase exercise as tolerated. Continue current meds      Relevant Medications   lisinopril (PRINIVIL,ZESTRIL) 20 MG tablet   metFORMIN (GLUCOPHAGE-XR) 750 MG 24 hr tablet   atorvastatin (LIPITOR) 40 MG tablet   glipiZIDE (GLUCOTROL) 10 MG tablet   Other Relevant Orders   Hemoglobin A1c (Completed)   Obesity    Encouraged DASH diet, decrease po intake and increase exercise as tolerated. Needs 7-8 hours of sleep nightly. Avoid trans fats, eat small, frequent meals every 4-5 hours with lean proteins, complex carbs and healthy fats. Minimize simple carbs      Relevant Medications   metFORMIN (GLUCOPHAGE-XR) 750 MG 24 hr tablet   glipiZIDE (GLUCOTROL) 10 MG tablet   HTN (hypertension)    Well controlled, no changes to meds. Encouraged heart healthy diet such as the DASH diet and exercise  as tolerated.       Relevant Medications   lisinopril (PRINIVIL,ZESTRIL) 20 MG tablet   metoprolol tartrate (LOPRESSOR) 50 MG tablet   triamterene-hydrochlorothiazide (MAXZIDE-25) 37.5-25 MG tablet   atorvastatin (LIPITOR) 40 MG tablet   Other Relevant Orders    CBC (Completed)   Comprehensive metabolic panel (Completed)   TSH (Completed)   Preventative health care   Hyperlipidemia, mixed    Encouraged heart healthy diet, increase exercise, avoid trans fats, consider a krill oil cap daily      Relevant Medications   lisinopril (PRINIVIL,ZESTRIL) 20 MG tablet   metoprolol tartrate (LOPRESSOR) 50 MG tablet   triamterene-hydrochlorothiazide (MAXZIDE-25) 37.5-25 MG tablet   atorvastatin (LIPITOR) 40 MG tablet   Breast cancer screening   Relevant Orders   MM SCREENING BREAST TOMO BILATERAL   Muscle cramps    Check magnesium      Relevant Orders   Magnesium (Completed)    Other Visit Diagnoses    High risk medication use    -  Primary   Relevant Orders   Pain Mgmt, Profile 8 w/Conf, U   Hyperlipidemia, unspecified hyperlipidemia type       Relevant Medications   lisinopril (PRINIVIL,ZESTRIL) 20 MG tablet   metoprolol tartrate (LOPRESSOR) 50 MG tablet   triamterene-hydrochlorothiazide (MAXZIDE-25) 37.5-25 MG tablet   atorvastatin (LIPITOR) 40 MG tablet   Other Relevant Orders   Lipid panel (Completed)   Acute bronchitis, unspecified organism          I have discontinued April Duey "[place]"'s azithromycin and glucose blood. I have also changed her lisinopril, metoprolol tartrate, triamterene-hydrochlorothiazide, and glipiZIDE. Additionally, I am having her start on glucose blood and amoxicillin-clavulanate. Lastly, I am having her maintain her ibuprofen, albuterol, FREESTYLE FREEDOM LITE, freestyle, gabapentin, LORazepam, metFORMIN, atorvastatin, and montelukast.  Meds ordered this encounter  Medications  . glucose blood test strip    Sig: Use as instructed    Dispense:  100 each    Refill:  12    Freestyle  . lisinopril (PRINIVIL,ZESTRIL) 20 MG tablet    Sig: Take 1 tablet (20 mg total) by mouth 2 (two) times daily.    Dispense:  180 tablet    Refill:  1    This prescription was filled on 06/18/2017. Any refills authorized  will be placed on file.  . gabapentin (NEURONTIN) 300 MG capsule    Sig: 1 tab po bid and 2 tabs po qhs    Dispense:  360 capsule    Refill:  1  . LORazepam (ATIVAN) 0.5 MG tablet    Sig: Take 1 tablet by mouth 2 times a day as needed for anxiety or sleep    Dispense:  40 tablet    Refill:  2  . metoprolol tartrate (LOPRESSOR) 50 MG tablet    Sig: Take 1 tablet (50 mg total) by mouth 2 (two) times daily.    Dispense:  180 tablet    Refill:  1    This prescription was filled on 06/18/2017. Any refills authorized will be placed on file.  . metFORMIN (GLUCOPHAGE-XR) 750 MG 24 hr tablet    Sig: Take 1 tablet (750 mg total) by mouth 3 (three) times daily.    Dispense:  270 tablet    Refill:  1    This prescription was filled on 09/17/2017. Any refills authorized will be placed on file.  . triamterene-hydrochlorothiazide (MAXZIDE-25) 37.5-25 MG tablet    Sig: Take 1 tablet by mouth daily.  Dispense:  90 tablet    Refill:  1  . atorvastatin (LIPITOR) 40 MG tablet    Sig: Take 1 and 1/2 tablet by mouth daily    Dispense:  135 tablet    Refill:  1  . montelukast (SINGULAIR) 10 MG tablet    Sig: Take 1 tablet (10 mg total) by mouth at bedtime.    Dispense:  90 tablet    Refill:  3  . glipiZIDE (GLUCOTROL) 10 MG tablet    Sig: Take 1 tablet (10 mg total) by mouth 2 (two) times daily.    Dispense:  180 tablet    Refill:  1    This prescription was filled on 05/19/2017. Any refills authorized will be placed on file.  Marland Kitchen amoxicillin-clavulanate (AUGMENTIN) 875-125 MG tablet    Sig: Take 1 tablet by mouth 2 (two) times daily.    Dispense:  20 tablet    Refill:  0    CMA served as scribe during this visit. History, Physical and Plan performed by medical provider. Documentation and orders reviewed and attested to.  Penni Homans, MD

## 2018-02-12 NOTE — Assessment & Plan Note (Signed)
Encouraged DASH diet, decrease po intake and increase exercise as tolerated. Needs 7-8 hours of sleep nightly. Avoid trans fats, eat small, frequent meals every 4-5 hours with lean proteins, complex carbs and healthy fats. Minimize simple carbs 

## 2018-02-12 NOTE — Assessment & Plan Note (Signed)
hgba1c acceptable, minimize simple carbs. Increase exercise as tolerated. Continue current meds 

## 2018-02-12 NOTE — Assessment & Plan Note (Signed)
Well controlled, no changes to meds. Encouraged heart healthy diet such as the DASH diet and exercise as tolerated.  °

## 2018-02-12 NOTE — Assessment & Plan Note (Signed)
Check magnesium. 

## 2018-02-12 NOTE — Patient Instructions (Signed)
Allergies An allergy is when your body reacts to a substance in a way that is not normal. An allergic reaction can happen after you:  Eat something.  Breathe in something.  Touch something.  You can be allergic to:  Things that are only around during certain seasons, like molds and pollens.  Foods.  Drugs.  Insects.  Animal dander.  What are the signs or symptoms?  Puffiness (swelling). This may happen on the lips, face, tongue, mouth, or throat.  Sneezing.  Coughing.  Breathing loudly (wheezing).  Stuffy nose.  Tingling in the mouth.  A rash.  Itching.  Itchy, red, puffy areas of skin (hives).  Watery eyes.  Throwing up (vomiting).  Watery poop (diarrhea).  Dizziness.  Feeling faint or fainting.  Trouble breathing or swallowing.  A tight feeling in the chest.  A fast heartbeat. How is this diagnosed? Allergies can be diagnosed with:  A medical and family history.  Skin tests.  Blood tests.  A food diary. A food diary is a record of all the foods, drinks, and symptoms you have each day.  The results of an elimination diet. This diet involves making sure not to eat certain foods and then seeing what happens when you start eating them again.  How is this treated? There is no cure for allergies, but allergic reactions can be treated with medicine. Severe reactions usually need to be treated at a hospital. How is this prevented? The best way to prevent an allergic reaction is to avoid the thing you are allergic to. Allergy shots and medicines can also help prevent reactions in some cases. This information is not intended to replace advice given to you by your health care provider. Make sure you discuss any questions you have with your health care provider. Document Released: 04/11/2013 Document Revised: 08/11/2016 Document Reviewed: 09/26/2014 [employer] Interactive Patient Education  2018 [employer]  

## 2018-02-12 NOTE — Assessment & Plan Note (Signed)
Encouraged heart healthy diet, increase exercise, avoid trans fats, consider a krill oil cap daily 

## 2018-02-17 LAB — PAIN MGMT, PROFILE 8 W/CONF, U
6 Acetylmorphine: NEGATIVE ng/mL (ref <10)
ALCOHOL METABOLITES: NEGATIVE ng/mL (ref <500)
ALPHAHYDROXYALPRAZOLAM: NEGATIVE ng/mL (ref <25)
AMINOCLONAZEPAM: NEGATIVE ng/mL (ref <25)
AMPHETAMINES: NEGATIVE ng/mL (ref <500)
Alphahydroxymidazolam: NEGATIVE ng/mL (ref <50)
Alphahydroxytriazolam: NEGATIVE ng/mL (ref <50)
BUPRENORPHINE, URINE: NEGATIVE ng/mL (ref <5)
Benzodiazepines: POSITIVE ng/mL — AB (ref <100)
COCAINE METABOLITE: NEGATIVE ng/mL (ref <150)
CREATININE: 120.5 mg/dL
Hydroxyethylflurazepam: NEGATIVE ng/mL (ref <50)
LORAZEPAM: 280 ng/mL — AB (ref <50)
MDMA: NEGATIVE ng/mL (ref <500)
Marijuana Metabolite: NEGATIVE ng/mL (ref <20)
Nordiazepam: NEGATIVE ng/mL (ref <50)
OXIDANT: NEGATIVE ug/mL (ref <200)
Opiates: NEGATIVE ng/mL (ref <100)
Oxazepam: NEGATIVE ng/mL (ref <50)
Oxycodone: NEGATIVE ng/mL (ref <100)
PH: 4.99 (ref 4.5–9.0)
TEMAZEPAM: NEGATIVE ng/mL (ref <50)

## 2018-04-03 ENCOUNTER — Other Ambulatory Visit: Payer: Self-pay | Admitting: Family Medicine

## 2018-04-03 DIAGNOSIS — I10 Essential (primary) hypertension: Secondary | ICD-10-CM

## 2018-04-03 DIAGNOSIS — E1169 Type 2 diabetes mellitus with other specified complication: Secondary | ICD-10-CM

## 2018-04-03 DIAGNOSIS — E669 Obesity, unspecified: Secondary | ICD-10-CM

## 2018-04-15 ENCOUNTER — Encounter (INDEPENDENT_AMBULATORY_CARE_PROVIDER_SITE_OTHER): Admitting: Ophthalmology

## 2018-04-15 ENCOUNTER — Ambulatory Visit (INDEPENDENT_AMBULATORY_CARE_PROVIDER_SITE_OTHER): Payer: TRICARE For Life (TFL) | Admitting: Ophthalmology

## 2018-04-22 ENCOUNTER — Ambulatory Visit (INDEPENDENT_AMBULATORY_CARE_PROVIDER_SITE_OTHER): Admitting: Ophthalmology

## 2018-05-20 ENCOUNTER — Ambulatory Visit (INDEPENDENT_AMBULATORY_CARE_PROVIDER_SITE_OTHER): Admitting: Family Medicine

## 2018-05-20 DIAGNOSIS — E782 Mixed hyperlipidemia: Secondary | ICD-10-CM | POA: Diagnosis not present

## 2018-05-20 DIAGNOSIS — E669 Obesity, unspecified: Secondary | ICD-10-CM | POA: Diagnosis not present

## 2018-05-20 DIAGNOSIS — E1169 Type 2 diabetes mellitus with other specified complication: Secondary | ICD-10-CM | POA: Diagnosis not present

## 2018-05-20 DIAGNOSIS — I10 Essential (primary) hypertension: Secondary | ICD-10-CM

## 2018-05-20 LAB — LIPID PANEL
CHOL/HDL RATIO: 4
Cholesterol: 90 mg/dL (ref 0–200)
HDL: 21.6 mg/dL — ABNORMAL LOW (ref >39.00)
NonHDL: 68.25
Triglycerides: 295 mg/dL — ABNORMAL HIGH (ref 0.0–149.0)
VLDL: 59 mg/dL — AB (ref 0.0–40.0)

## 2018-05-20 LAB — T4, FREE: FREE T4: 1.12 ng/dL (ref 0.60–1.60)

## 2018-05-20 LAB — CBC
HEMATOCRIT: 36.8 % (ref 36.0–46.0)
Hemoglobin: 12.1 g/dL (ref 12.0–15.0)
MCHC: 32.8 g/dL (ref 30.0–36.0)
MCV: 87.7 fl (ref 78.0–100.0)
Platelets: 311 10*3/uL (ref 150.0–400.0)
RBC: 4.2 Mil/uL (ref 3.87–5.11)
RDW: 14.3 % (ref 11.5–15.5)
WBC: 7.6 10*3/uL (ref 4.0–10.5)

## 2018-05-20 LAB — COMPREHENSIVE METABOLIC PANEL
ALT: 17 U/L (ref 0–35)
AST: 17 U/L (ref 0–37)
Albumin: 3.5 g/dL (ref 3.5–5.2)
Alkaline Phosphatase: 127 U/L — ABNORMAL HIGH (ref 39–117)
BUN: 15 mg/dL (ref 6–23)
CHLORIDE: 105 meq/L (ref 96–112)
CO2: 29 mEq/L (ref 19–32)
Calcium: 9.1 mg/dL (ref 8.4–10.5)
Creatinine, Ser: 0.84 mg/dL (ref 0.40–1.20)
GFR: 76.97 mL/min (ref >60.00)
GLUCOSE: 177 mg/dL — AB (ref 70–99)
POTASSIUM: 5.2 meq/L — AB (ref 3.5–5.1)
SODIUM: 139 meq/L (ref 135–145)
TOTAL PROTEIN: 6.5 g/dL (ref 6.0–8.3)
Total Bilirubin: 0.4 mg/dL (ref 0.2–1.2)

## 2018-05-20 LAB — LDL CHOLESTEROL, DIRECT: Direct LDL: 37 mg/dL

## 2018-05-20 LAB — HEMOGLOBIN A1C: Hgb A1c MFr Bld: 9.3 % — ABNORMAL HIGH (ref 4.6–6.5)

## 2018-05-20 LAB — TSH: TSH: 3.43 u[IU]/mL (ref 0.35–4.50)

## 2018-05-20 NOTE — Patient Instructions (Signed)

## 2018-05-20 NOTE — Progress Notes (Signed)
Subjective:  I acted as a Education administrator for Dr. Charlett Blake. April Warren, [place]  Patient ID: April Warren, female    DOB: 1971-09-25, 47 y.o.   MRN: [id number]  Chief Complaint  Patient presents with  . Follow-up    HPI  Patient is in today for a 3 month follow up. She is following up her DM, hyperlipidemia and various medical concerns. She has no acute concerns today. No recent febrile illness or acute hospitalizations. Denies CP/palp/SOB/HA/congestion/fevers/GI or GU c/o. Taking meds as prescribed. Her foster son who was premature has been sick off and on so that has been stressful but she feels she is handling it well. Sugars have been ranging from 150 to 180. Denies polyuria and polydipsia   Patient Care Team: Mosie Lukes, MD as PCP - General (Family Medicine)   Past Medical History:  Diagnosis Date  . Allergy   . Chicken pox 47 yrs old  . Diabetes mellitus 08-12-12   type 2- dr Zigmund Daniel diagnosed  . Diabetic retinopathy ([place])   . Dysmenorrhea 08/20/2012  . HTN (hypertension) 08/20/2012  . Hyperlipidemia, mixed 08/28/2013  . Neuropathy 02/16/2017  . Obesity   . Obesity, unspecified 08/28/2013  . Other and unspecified hyperlipidemia 08/28/2013  . Preventative health care 08/20/2012  . Sinusitis 11/14/2013  . Sinusitis, acute 05/02/2016    Past Surgical History:  Procedure Laterality Date  . REFRACTIVE SURGERY  08-12-12   right, left on 09/16/12    Family History  Problem Relation Age of Onset  . Cancer Mother 66       breast  . Diabetes Mother        type 2  . Hypertension Mother   . Hypertension Father   . Hyperlipidemia Father   . COPD Father   . Hypertension Maternal Grandmother   . Hyperlipidemia Maternal Grandmother   . COPD Maternal Grandmother   . Asthma Maternal Grandmother   . Hypertension Maternal Grandfather   . Heart attack Maternal Grandfather   . Gout Brother   . Cancer Brother        lung  . Heart disease Paternal Grandfather     Social History    Socioeconomic History  . Marital status: Married    Spouse name: Not on file  . Number of children: Not on file  . Years of education: Not on file  . Highest education level: Not on file  Occupational History  . Not on file  Social Needs  . Financial resource strain: Not on file  . Food insecurity:    Worry: Not on file    Inability: Not on file  . Transportation needs:    Medical: Not on file    Non-medical: Not on file  Tobacco Use  . Smoking status: Never Smoker  . Smokeless tobacco: Never Used  Substance and Sexual Activity  . Alcohol use: Yes    Comment: socially  . Drug use: No  . Sexual activity: Yes    Partners: Male  Lifestyle  . Physical activity:    Days per week: Not on file    Minutes per session: Not on file  . Stress: Not on file  Relationships  . Social connections:    Talks on phone: Not on file    Gets together: Not on file    Attends religious service: Not on file    Active member of club or organization: Not on file    Attends meetings of clubs or organizations: Not on file  Relationship status: Not on file  . Intimate partner violence:    Fear of current or ex partner: Not on file    Emotionally abused: Not on file    Physically abused: Not on file    Forced sexual activity: Not on file  Other Topics Concern  . Not on file  Social History Narrative   Lives with husband.    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    Not exercising    Outpatient Medications Prior to Visit  Medication Sig Dispense Refill  . albuterol (PROVENTIL HFA;VENTOLIN HFA) 108 (90 Base) MCG/ACT inhaler Inhale 2 puffs into the lungs every 6 (six) hours as needed for wheezing or shortness of breath. 1 Inhaler 5  . atorvastatin (LIPITOR) 40 MG tablet TAKE ONE AND ONE-HALF TABLETS DAILY 135 tablet 1  . Blood Glucose Monitoring Suppl (FREESTYLE FREEDOM LITE) w/Device KIT  Check blood sugar twice daily 1 each 0  . gabapentin (NEURONTIN) 300 MG capsule TAKE 1 CAPSULE TWICE A DAY AND 2 CAPSULES AT BEDTIME 360 capsule 1  . glipiZIDE (GLUCOTROL) 10 MG tablet TAKE 1 TABLET TWICE A DAY 180 tablet 1  . glucose blood test strip Use as instructed 100 each 12  . ibuprofen (ADVIL,MOTRIN) 200 MG tablet Take 400 mg by mouth every 8 (eight) hours as needed.    . Lancets (FREESTYLE) lancets Check blood sugar twice daily 200 each 5  . lisinopril (PRINIVIL,ZESTRIL) 20 MG tablet TAKE 1 TABLET TWICE A DAY 180 tablet 1  . LORazepam (ATIVAN) 0.5 MG tablet Take 1 tablet by mouth 2 times a day as needed for anxiety or sleep 40 tablet 2  . metFORMIN (GLUCOPHAGE-XR) 750 MG 24 hr tablet TAKE 1 TABLET THREE TIMES A DAY 270 tablet 1  . metoprolol tartrate (LOPRESSOR) 50 MG tablet TAKE 1 TABLET TWICE A DAY 180 tablet 1  . montelukast (SINGULAIR) 10 MG tablet Take 1 tablet (10 mg total) by mouth at bedtime. 90 tablet 3  . triamterene-hydrochlorothiazide (MAXZIDE-25) 37.5-25 MG tablet TAKE 1 TABLET DAILY 90 tablet 1  . amoxicillin-clavulanate (AUGMENTIN) 875-125 MG tablet Take 1 tablet by mouth 2 (two) times daily. 20 tablet 0   No facility-administered medications prior to visit.     No Known Allergies  Review of Systems  Constitutional: Positive for malaise/fatigue. Negative for fever.  HENT: Negative for congestion.   Eyes: Negative for blurred vision.  Respiratory: Negative for shortness of breath.   Cardiovascular: Negative for chest pain, palpitations and leg swelling.  Gastrointestinal: Negative for abdominal pain, blood in stool and nausea.  Genitourinary: Negative for dysuria and frequency.  Musculoskeletal: Negative for falls.  Skin: Negative for rash.  Neurological: Negative for dizziness, loss of consciousness and headaches.  Endo/Heme/Allergies: Negative for environmental allergies.  Psychiatric/Behavioral: Negative for depression. The patient is not nervous/anxious.         Objective:    Physical Exam  Constitutional: She is oriented to person, place, and time. She appears well-developed and well-nourished. No distress.  HENT:  Head: Normocephalic and atraumatic.  Nose: Nose normal.  Eyes: Right eye exhibits no discharge. Left eye exhibits no discharge.  Neck: Normal range of motion. Neck supple.  Cardiovascular: Normal rate and regular rhythm.  No murmur heard. Pulmonary/Chest: Effort normal and breath sounds normal.  Abdominal: Soft. Bowel sounds are normal. There is no tenderness.  Musculoskeletal: She exhibits no edema.  Neurological:  She is alert and oriented to person, place, and time.  Skin: Skin is warm and dry.  Psychiatric: She has a normal mood and affect.  Nursing note and vitals reviewed.   BP 140/76 (BP Location: Left Arm, Patient Position: Sitting, Cuff Size: Normal)   Pulse 68   Temp 98.3 F (36.8 C) (Oral)   Resp 18   Ht 5' 9"  (1.753 m)   Wt (!) 313 lb 9.6 oz (142.2 kg)   SpO2 97%   BMI 46.31 kg/m  Wt Readings from Last 3 Encounters:  05/20/18 (!) 313 lb 9.6 oz (142.2 kg)  02/12/18 (!) 310 lb 6.4 oz (140.8 kg)  10/13/17 (!) 308 lb 6.4 oz (139.9 kg)   BP Readings from Last 3 Encounters:  05/20/18 140/76  02/12/18 (!) 142/88  10/13/17 (!) 150/86     Immunization History  Administered Date(s) Administered  . Influenza,inj,Quad PF,6+ Mos 10/13/2017  . Td 12/29/2006  . Tdap 08/20/2012    Health Maintenance  Topic Date Due  . PNEUMOCOCCAL POLYSACCHARIDE VACCINE (1) 07/22/1972  . PAP SMEAR  11/14/2016  . FOOT EXAM  05/02/2017  . OPHTHALMOLOGY EXAM  04/13/2018  . INFLUENZA VACCINE  07/29/2018  . HEMOGLOBIN A1C  08/12/2018  . TETANUS/TDAP  08/20/2022  . HIV Screening  Completed    Lab Results  Component Value Date   WBC 8.0 02/12/2018   HGB 11.9 (L) 02/12/2018   HCT 36.0 02/12/2018   PLT 316.0 02/12/2018   GLUCOSE 186 (H) 02/12/2018   CHOL 109 02/12/2018   TRIG 334.0 (H) 02/12/2018   HDL 21.30 (L)  02/12/2018   LDLDIRECT 45.0 02/12/2018   LDLCALC 13 10/13/2017   ALT 15 02/12/2018   AST 17 02/12/2018   NA 136 02/12/2018   K 4.7 02/12/2018   CL 100 02/12/2018   CREATININE 0.88 02/12/2018   BUN 20 02/12/2018   CO2 29 02/12/2018   TSH 3.57 02/12/2018   INR 1.03 03/04/2011   HGBA1C 8.5 (H) 02/12/2018   MICROALBUR 19.6 (H) 01/29/2016    Lab Results  Component Value Date   TSH 3.57 02/12/2018   Lab Results  Component Value Date   WBC 8.0 02/12/2018   HGB 11.9 (L) 02/12/2018   HCT 36.0 02/12/2018   MCV 88.6 02/12/2018   PLT 316.0 02/12/2018   Lab Results  Component Value Date   NA 136 02/12/2018   K 4.7 02/12/2018   CO2 29 02/12/2018   GLUCOSE 186 (H) 02/12/2018   BUN 20 02/12/2018   CREATININE 0.88 02/12/2018   BILITOT 0.6 02/12/2018   ALKPHOS 98 02/12/2018   AST 17 02/12/2018   ALT 15 02/12/2018   PROT 6.9 02/12/2018   ALBUMIN 3.7 02/12/2018   CALCIUM 9.0 02/12/2018   GFR 73.03 02/12/2018   Lab Results  Component Value Date   CHOL 109 02/12/2018   Lab Results  Component Value Date   HDL 21.30 (L) 02/12/2018   Lab Results  Component Value Date   LDLCALC 13 10/13/2017   Lab Results  Component Value Date   TRIG 334.0 (H) 02/12/2018   Lab Results  Component Value Date   CHOLHDL 5 02/12/2018   Lab Results  Component Value Date   HGBA1C 8.5 (H) 02/12/2018         Assessment & Plan:   Problem List Items Addressed This Visit    Diabetes mellitus type 2 in obese ([place])    hgba1c acceptable, minimize simple carbs. Increase exercise as tolerated.  Relevant Orders   Hemoglobin A1c   T4, free   HTN (hypertension)    Poorly controlled will alter medications, encouraged DASH diet, minimize caffeine and obtain adequate sleep. Report concerning symptoms and follow up as directed and as needed      Relevant Orders   CBC   Comprehensive metabolic panel   TSH   T4, free   Hyperlipidemia, mixed    Encouraged heart healthy diet, increase  exercise, avoid trans fats, consider a krill oil cap daily      Relevant Orders   Lipid panel   T4, free      I have discontinued Carlus Pavlov "Loleta"'s amoxicillin-clavulanate. I am also having her maintain her ibuprofen, albuterol, FREESTYLE FREEDOM LITE, freestyle, glucose blood, LORazepam, montelukast, metoprolol tartrate, atorvastatin, glipiZIDE, gabapentin, lisinopril, metFORMIN, and triamterene-hydrochlorothiazide.  No orders of the defined types were placed in this encounter.   CMA served as Education administrator during this visit. History, Physical and Plan performed by medical provider. Documentation and orders reviewed and attested to.  Penni Homans, MD

## 2018-05-20 NOTE — Assessment & Plan Note (Signed)
hgba1c acceptable, minimize simple carbs. Increase exercise as tolerated.  

## 2018-05-20 NOTE — Assessment & Plan Note (Signed)
Poorly controlled will alter medications, encouraged DASH diet, minimize caffeine and obtain adequate sleep. Report concerning symptoms and follow up as directed and as needed 

## 2018-05-20 NOTE — Assessment & Plan Note (Signed)
Encouraged heart healthy diet, increase exercise, avoid trans fats, consider a krill oil cap daily 

## 2018-05-21 MED ORDER — SITAGLIPTIN PHOSPHATE 100 MG PO TABS: 100.0000 mg | ORAL_TABLET | Freq: Every day | ORAL | 1 refills | Status: DC

## 2018-05-21 NOTE — Addendum Note (Signed)
Addended by: Crissie Sickles A on: 05/21/2018 03:42 PM   Modules accepted: Orders

## 2018-08-19 ENCOUNTER — Other Ambulatory Visit (HOSPITAL_COMMUNITY)
Admission: RE | Admit: 2018-08-19 | Discharge: 2018-08-19 | Disposition: A | Discharge status: Home / Self Care | Source: Ambulatory Visit | Attending: Family Medicine | Admitting: Family Medicine

## 2018-08-19 ENCOUNTER — Ambulatory Visit (INDEPENDENT_AMBULATORY_CARE_PROVIDER_SITE_OTHER): Admitting: Family Medicine

## 2018-08-19 VITALS — BP 138/70 | HR 78 | Temp 98.1°F | Resp 18 | Ht 70.0 in | Wt 312.6 lb

## 2018-08-19 DIAGNOSIS — Z79899 Other long term (current) drug therapy: Secondary | ICD-10-CM

## 2018-08-19 DIAGNOSIS — F419 Anxiety disorder, unspecified: Secondary | ICD-10-CM | POA: Insufficient documentation

## 2018-08-19 DIAGNOSIS — Z124 Encounter for screening for malignant neoplasm of cervix: Secondary | ICD-10-CM | POA: Diagnosis not present

## 2018-08-19 DIAGNOSIS — I10 Essential (primary) hypertension: Secondary | ICD-10-CM

## 2018-08-19 DIAGNOSIS — Z0000 Encounter for general adult medical examination without abnormal findings: Secondary | ICD-10-CM

## 2018-08-19 DIAGNOSIS — E669 Obesity, unspecified: Secondary | ICD-10-CM | POA: Diagnosis not present

## 2018-08-19 DIAGNOSIS — E782 Mixed hyperlipidemia: Secondary | ICD-10-CM | POA: Diagnosis not present

## 2018-08-19 DIAGNOSIS — E11319 Type 2 diabetes mellitus with unspecified diabetic retinopathy without macular edema: Secondary | ICD-10-CM | POA: Diagnosis not present

## 2018-08-19 LAB — COMPREHENSIVE METABOLIC PANEL
ALT: 14 U/L (ref 0–35)
AST: 13 U/L (ref 0–37)
Albumin: 3.9 g/dL (ref 3.5–5.2)
Alkaline Phosphatase: 125 U/L — ABNORMAL HIGH (ref 39–117)
BUN: 17 mg/dL (ref 6–23)
CALCIUM: 8.8 mg/dL (ref 8.4–10.5)
CHLORIDE: 101 meq/L (ref 96–112)
CO2: 26 meq/L (ref 19–32)
CREATININE: 0.91 mg/dL (ref 0.40–1.20)
GFR: 70.11 mL/min (ref >60.00)
Glucose, Bld: 219 mg/dL — ABNORMAL HIGH (ref 70–99)
POTASSIUM: 4.2 meq/L (ref 3.5–5.1)
Sodium: 136 mEq/L (ref 135–145)
Total Bilirubin: 0.5 mg/dL (ref 0.2–1.2)
Total Protein: 6.8 g/dL (ref 6.0–8.3)

## 2018-08-19 LAB — CBC
HCT: 38.4 % (ref 36.0–46.0)
Hemoglobin: 12.4 g/dL (ref 12.0–15.0)
MCHC: 32.4 g/dL (ref 30.0–36.0)
MCV: 90.9 fl (ref 78.0–100.0)
Platelets: 306 10*3/uL (ref 150.0–400.0)
RBC: 4.22 Mil/uL (ref 3.87–5.11)
RDW: 13.8 % (ref 11.5–15.5)
WBC: 9.3 10*3/uL (ref 4.0–10.5)

## 2018-08-19 LAB — LIPID PANEL
Cholesterol: 78 mg/dL (ref 0–200)
HDL: 21 mg/dL — ABNORMAL LOW
NonHDL: 57.44
Total CHOL/HDL Ratio: 4
Triglycerides: 246 mg/dL — ABNORMAL HIGH (ref 0.0–149.0)
VLDL: 49.2 mg/dL — ABNORMAL HIGH (ref 0.0–40.0)

## 2018-08-19 LAB — LDL CHOLESTEROL, DIRECT: Direct LDL: 37 mg/dL

## 2018-08-19 LAB — HEMOGLOBIN A1C: Hgb A1c MFr Bld: 8.1 % — ABNORMAL HIGH (ref 4.6–6.5)

## 2018-08-19 LAB — TSH: TSH: 2.84 u[IU]/mL (ref 0.35–4.50)

## 2018-08-19 NOTE — Assessment & Plan Note (Signed)
Well controlled, no changes to meds. Encouraged heart healthy diet such as the DASH diet and exercise as tolerated.  °

## 2018-08-19 NOTE — Assessment & Plan Note (Signed)
Encouraged DASH diet, decrease po intake and increase exercise as tolerated. Needs 7-8 hours of sleep nightly. Avoid trans fats, eat small, frequent meals every 4-5 hours with lean proteins, complex carbs and healthy fats. Minimize simple carbs 

## 2018-08-19 NOTE — Assessment & Plan Note (Signed)
Encouraged heart healthy diet, increase exercise, avoid trans fats, consider a krill oil cap daily 

## 2018-08-19 NOTE — Assessment & Plan Note (Signed)
Pap today, no concerns on exam.  

## 2018-08-19 NOTE — Patient Instructions (Addendum)
Call Dr Zigmund Daniel for your next eye doctor apointment   Preventive Care 40-64 Years, Female Preventive care refers to lifestyle choices and visits with your health care provider that can promote health and wellness. What does preventive care include?  A yearly physical exam. This is also called an annual well check.  Dental exams once or twice a year.  Routine eye exams. Ask your health care provider how often you should have your eyes checked.  Personal lifestyle choices, including: ? Daily care of your teeth and gums. ? Regular physical activity. ? Eating a healthy diet. ? Avoiding tobacco and drug use. ? Limiting alcohol use. ? Practicing safe sex. ? Taking low-dose aspirin daily starting at age 68. ? Taking vitamin and mineral supplements as recommended by your health care provider. What happens during an annual well check? The services and screenings done by your health care provider during your annual well check will depend on your age, overall health, lifestyle risk factors, and family history of disease. Counseling Your health care provider may ask you questions about your:  Alcohol use.  Tobacco use.  Drug use.  Emotional well-being.  Home and relationship well-being.  Sexual activity.  Eating habits.  Work and work Statistician.  Method of birth control.  Menstrual cycle.  Pregnancy history.  Screening You may have the following tests or measurements:  Height, weight, and BMI.  Blood pressure.  Lipid and cholesterol levels. These may be checked every 5 years, or more frequently if you are over 59 years old.  Skin check.  Lung cancer screening. You may have this screening every year starting at age 4 if you have a 30-pack-year history of smoking and currently smoke or have quit within the past 15 years.  Fecal occult blood test (FOBT) of the stool. You may have this test every year starting at age 68.  Flexible sigmoidoscopy or colonoscopy. You  may have a sigmoidoscopy every 5 years or a colonoscopy every 10 years starting at age 33.  Hepatitis C blood test.  Hepatitis B blood test.  Sexually transmitted disease (STD) testing.  Diabetes screening. This is done by checking your blood sugar (glucose) after you have not eaten for a while (fasting). You may have this done every 1-3 years.  Mammogram. This may be done every 1-2 years. Talk to your health care provider about when you should start having regular mammograms. This may depend on whether you have a family history of breast cancer.  BRCA-related cancer screening. This may be done if you have a family history of breast, ovarian, tubal, or peritoneal cancers.  Pelvic exam and Pap test. This may be done every 3 years starting at age 64. Starting at age 23, this may be done every 5 years if you have a Pap test in combination with an HPV test.  Bone density scan. This is done to screen for osteoporosis. You may have this scan if you are at high risk for osteoporosis.  Discuss your test results, treatment options, and if necessary, the need for more tests with your health care provider. Vaccines Your health care provider may recommend certain vaccines, such as:  Influenza vaccine. This is recommended every year.  Tetanus, diphtheria, and acellular pertussis (Tdap, Td) vaccine. You may need a Td booster every 10 years.  Varicella vaccine. You may need this if you have not been vaccinated.  Zoster vaccine. You may need this after age 78.  Measles, mumps, and rubella (MMR) vaccine. You may need  at least one dose of MMR if you were born in 1957 or later. You may also need a second dose.  Pneumococcal 13-valent conjugate (PCV13) vaccine. You may need this if you have certain conditions and were not previously vaccinated.  Pneumococcal polysaccharide (PPSV23) vaccine. You may need one or two doses if you smoke cigarettes or if you have certain conditions.  Meningococcal  vaccine. You may need this if you have certain conditions.  Hepatitis A vaccine. You may need this if you have certain conditions or if you travel or work in places where you may be exposed to hepatitis A.  Hepatitis B vaccine. You may need this if you have certain conditions or if you travel or work in places where you may be exposed to hepatitis B.  Haemophilus influenzae type b (Hib) vaccine. You may need this if you have certain conditions.  Talk to your health care provider about which screenings and vaccines you need and how often you need them. This information is not intended to replace advice given to you by your health care provider. Make sure you discuss any questions you have with your health care provider. Document Released: 01/11/2016 Document Revised: 09/03/2016 Document Reviewed: 10/16/2015 Elsevier Interactive Patient Education  [employer].

## 2018-08-19 NOTE — Assessment & Plan Note (Signed)
Patient encouraged to maintain heart healthy diet, regular exercise, adequate sleep. Consider daily probiotics. Take medications as prescribed. MGM in 2/19 normal. Pap today labs today

## 2018-08-19 NOTE — Progress Notes (Signed)
Subjective:  I acted as a Education administrator for Dr. Charlett Blake. [name], [place]  Patient ID: April Warren, female    DOB: [birth year], 47 y.o.   MRN: [id number]  No chief complaint on file.   HPI  Patient is in today for an annual exam. She is following up on her diabetes, hypertension, hyperlipidemia and more. She is under a great deal of stress her how has a 48 year old, 48 year old and now 71 year old foster child that just joined them this week. He has been ok so far but he has been in jail and has drug concerns. No recent febrile illness or hospitalizations. No polyuria or polydipsia. Sugars ranging from 109 to 250. Denies CP/palp/SOB/HA/congestion/fevers/GI or GU c/o. Taking meds as prescribed  Patient Care Team: Mosie Lukes, MD as PCP - General (Family Medicine)   Past Medical History:  Diagnosis Date  . Allergy   . Chicken pox 47 yrs old  . Diabetes mellitus 08-12-12   type 2- dr Zigmund Daniel diagnosed  . Diabetic retinopathy ([place])   . Dysmenorrhea 08/20/2012  . HTN (hypertension) 08/20/2012  . Hyperlipidemia, mixed 08/28/2013  . Neuropathy 02/16/2017  . Obesity   . Obesity, unspecified 08/28/2013  . Other and unspecified hyperlipidemia 08/28/2013  . Preventative health care 08/20/2012  . Sinusitis 11/14/2013  . Sinusitis, acute 05/02/2016    Past Surgical History:  Procedure Laterality Date  . REFRACTIVE SURGERY  08-12-12   right, left on 09/16/12    Family History  Problem Relation Age of Onset  . Cancer Mother 7       breast  . Diabetes Mother        type 2  . Hypertension Mother   . Hypertension Father   . Hyperlipidemia Father   . COPD Father   . Hypertension Maternal Grandmother   . Hyperlipidemia Maternal Grandmother   . COPD Maternal Grandmother   . Asthma Maternal Grandmother   . Hypertension Maternal Grandfather   . Heart attack Maternal Grandfather   . Gout Brother   . Cancer Brother        lung  . Heart disease Paternal Grandfather     Social History    Socioeconomic History  . Marital status: Married    Spouse name: Not on file  . Number of children: Not on file  . Years of education: Not on file  . Highest education level: Not on file  Occupational History  . Not on file  Social Needs  . Financial resource strain: Not on file  . Food insecurity:    Worry: Not on file    Inability: Not on file  . Transportation needs:    Medical: Not on file    Non-medical: Not on file  Tobacco Use  . Smoking status: Never Smoker  . Smokeless tobacco: Never Used  Substance and Sexual Activity  . Alcohol use: Yes    Comment: socially  . Drug use: No  . Sexual activity: Yes    Partners: Male  Lifestyle  . Physical activity:    Days per week: Not on file    Minutes per session: Not on file  . Stress: Not on file  Relationships  . Social connections:    Talks on phone: Not on file    Gets together: Not on file    Attends religious service: Not on file    Active member of club or organization: Not on file    Attends meetings of clubs or organizations:  Not on file    Relationship status: Not on file  . Intimate partner violence:    Fear of current or ex partner: Not on file    Emotionally abused: Not on file    Physically abused: Not on file    Forced sexual activity: Not on file  Other Topics Concern  . Not on file  Social History Narrative   Lives with husband.    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    Not exercising    Outpatient Medications Prior to Visit  Medication Sig Dispense Refill  . albuterol (PROVENTIL HFA;VENTOLIN HFA) 108 (90 Base) MCG/ACT inhaler Inhale 2 puffs into the lungs every 6 (six) hours as needed for wheezing or shortness of breath. 1 Inhaler 5  . atorvastatin (LIPITOR) 40 MG tablet TAKE ONE AND ONE-HALF TABLETS DAILY 135 tablet 1  . Blood Glucose Monitoring Suppl (FREESTYLE FREEDOM LITE) w/Device KIT  Check blood sugar twice daily 1 each 0  . gabapentin (NEURONTIN) 300 MG capsule TAKE 1 CAPSULE TWICE A DAY AND 2 CAPSULES AT BEDTIME 360 capsule 1  . glipiZIDE (GLUCOTROL) 10 MG tablet TAKE 1 TABLET TWICE A DAY 180 tablet 1  . glucose blood test strip Use as instructed 100 each 12  . ibuprofen (ADVIL,MOTRIN) 200 MG tablet Take 400 mg by mouth every 8 (eight) hours as needed.    . Lancets (FREESTYLE) lancets Check blood sugar twice daily 200 each 5  . lisinopril (PRINIVIL,ZESTRIL) 20 MG tablet TAKE 1 TABLET TWICE A DAY 180 tablet 1  . LORazepam (ATIVAN) 0.5 MG tablet Take 1 tablet by mouth 2 times a day as needed for anxiety or sleep 40 tablet 2  . metFORMIN (GLUCOPHAGE-XR) 750 MG 24 hr tablet TAKE 1 TABLET THREE TIMES A DAY 270 tablet 1  . metoprolol tartrate (LOPRESSOR) 50 MG tablet TAKE 1 TABLET TWICE A DAY 180 tablet 1  . montelukast (SINGULAIR) 10 MG tablet Take 1 tablet (10 mg total) by mouth at bedtime. 90 tablet 3  . sitaGLIPtin (JANUVIA) 100 MG tablet Take 1 tablet (100 mg total) by mouth daily. 90 tablet 1  . triamterene-hydrochlorothiazide (MAXZIDE-25) 37.5-25 MG tablet TAKE 1 TABLET DAILY 90 tablet 1   No facility-administered medications prior to visit.     No Known Allergies  Review of Systems  Constitutional: Positive for malaise/fatigue. Negative for fever.  HENT: Negative for congestion.   Eyes: Negative for blurred vision.  Respiratory: Negative for shortness of breath.   Cardiovascular: Negative for chest pain, palpitations and leg swelling.  Gastrointestinal: Negative for abdominal pain, blood in stool and nausea.  Genitourinary: Negative for dysuria and frequency.  Musculoskeletal: Negative for falls.  Skin: Negative for rash.  Neurological: Negative for dizziness, loss of consciousness and headaches.  Endo/Heme/Allergies: Negative for environmental allergies.  Psychiatric/Behavioral: Negative for depression. The patient is not nervous/anxious.        Objective:     Physical Exam  Constitutional: She is oriented to person, place, and time. She appears well-developed and well-nourished. No distress.  HENT:  Head: Normocephalic and atraumatic.  Eyes: Conjunctivae are normal.  Neck: Neck supple. No thyromegaly present.  Cardiovascular: Normal rate, regular rhythm and normal heart sounds.  No murmur heard. Pulmonary/Chest: Effort normal and breath sounds normal. No respiratory distress.  Abdominal: Soft. Bowel sounds are normal. She exhibits no distension and no mass. There is no tenderness.  Musculoskeletal: She exhibits no edema.  Lymphadenopathy:    She has no cervical adenopathy.  Neurological: She is alert and oriented to person, place, and time.  Skin: Skin is warm and dry.  Psychiatric: She has a normal mood and affect. Her behavior is normal.    BP 138/70 (BP Location: Left Arm, Patient Position: Sitting, Cuff Size: Normal)   Pulse 78   Temp 98.1 F (36.7 C) (Oral)   Resp 18   Ht _0  (1.778 m)   Wt (!) 312 lb 9.6 oz (141.8 kg)   SpO2 98%   BMI 44.85 kg/m  Wt Readings from Last 3 Encounters:  08/19/18 (!) 312 lb 9.6 oz (141.8 kg)  05/20/18 (!) 313 lb 9.6 oz (142.2 kg)  02/12/18 (!) 310 lb 6.4 oz (140.8 kg)   BP Readings from Last 3 Encounters:  08/19/18 138/70  05/20/18 140/76  02/12/18 (!) 142/88     Immunization History  Administered Date(s) Administered  . Influenza,inj,Quad PF,6+ Mos 10/13/2017  . Td 12/29/2006  . Tdap 08/20/2012    Health Maintenance  Topic Date Due  . PNEUMOCOCCAL POLYSACCHARIDE VACCINE AGE 75-64 HIGH RISK  07/22/1972  . PAP SMEAR  11/14/2016  . FOOT EXAM  05/02/2017  . OPHTHALMOLOGY EXAM  04/13/2018  . INFLUENZA VACCINE  07/29/2018  . HEMOGLOBIN A1C  11/20/2018  . TETANUS/TDAP  08/20/2022  . HIV Screening  Completed    Lab Results  Component Value Date   WBC 7.6 05/20/2018   HGB 12.1 05/20/2018   HCT 36.8 05/20/2018   PLT 311.0 05/20/2018   GLUCOSE 177 (H) 05/20/2018   CHOL 90  05/20/2018   TRIG 295.0 (H) 05/20/2018   HDL 21.60 (L) 05/20/2018   LDLDIRECT 37.0 05/20/2018   LDLCALC 13 10/13/2017   ALT 17 05/20/2018   AST 17 05/20/2018   NA 139 05/20/2018   K 5.2 (H) 05/20/2018   CL 105 05/20/2018   CREATININE 0.84 05/20/2018   BUN 15 05/20/2018   CO2 29 05/20/2018   TSH 3.43 05/20/2018   INR 1.03 03/04/2011   HGBA1C 9.3 (H) 05/20/2018   MICROALBUR 19.6 (H) 01/29/2016    Lab Results  Component Value Date   TSH 3.43 05/20/2018   Lab Results  Component Value Date   WBC 7.6 05/20/2018   HGB 12.1 05/20/2018   HCT 36.8 05/20/2018   MCV 87.7 05/20/2018   PLT 311.0 05/20/2018   Lab Results  Component Value Date   NA 139 05/20/2018   K 5.2 (H) 05/20/2018   CO2 29 05/20/2018   GLUCOSE 177 (H) 05/20/2018   BUN 15 05/20/2018   CREATININE 0.84 05/20/2018   BILITOT 0.4 05/20/2018   ALKPHOS 127 (H) 05/20/2018   AST 17 05/20/2018   ALT 17 05/20/2018   PROT 6.5 05/20/2018   ALBUMIN 3.5 05/20/2018   CALCIUM 9.1 05/20/2018   GFR 76.97 05/20/2018   Lab Results  Component Value Date   CHOL 90 05/20/2018   Lab Results  Component Value Date   HDL 21.60 (L) 05/20/2018   Lab Results  Component Value Date   LDLCALC 13 10/13/2017   Lab Results  Component Value Date   TRIG 295.0 (H) 05/20/2018   Lab Results  Component Value Date   CHOLHDL 4 05/20/2018   Lab Results  Component Value Date   HGBA1C 9.3 (H) 05/20/2018         Assessment & Plan:   Problem List Items Addressed This Visit    Diabetic retinopathy (  [place])   Relevant Orders   Hemoglobin A1c   Obesity    Encouraged DASH diet, decrease po intake and increase exercise as tolerated. Needs 7-8 hours of sleep nightly. Avoid trans fats, eat small, frequent meals every 4-5 hours with lean proteins, complex carbs and healthy fats. Minimize simple carbs      Relevant Orders   Amb Ref to Medical Weight Management   HTN (hypertension)    Well controlled, no changes to meds. Encouraged  heart healthy diet such as the DASH diet and exercise as tolerated.       Relevant Orders   CBC   Comprehensive metabolic panel   TSH   Preventative health care - Primary    Patient encouraged to maintain heart healthy diet, regular exercise, adequate sleep. Consider daily probiotics. Take medications as prescribed. MGM in 2/19 normal. Pap today labs today      Hyperlipidemia, mixed    Encouraged heart healthy diet, increase exercise, avoid trans fats, consider a krill oil cap daily      Relevant Orders   Lipid panel   Cervical cancer screening    Pap today, no concerns on exam.       Relevant Orders   Cytology - PAP   Anxiety    Doing well Lorazepam prn       Other Visit Diagnoses    High risk medication use       Relevant Orders   Pain Mgmt, Profile 8 w/Conf, U      I am having Jaley Sarwar "Loleta" maintain her ibuprofen, albuterol, FREESTYLE FREEDOM LITE, freestyle, glucose blood, LORazepam, montelukast, metoprolol tartrate, atorvastatin, glipiZIDE, gabapentin, lisinopril, metFORMIN, triamterene-hydrochlorothiazide, and sitaGLIPtin.  No orders of the defined types were placed in this encounter.   CMA served as Education administrator during this visit. History, Physical and Plan performed by medical provider. Documentation and orders reviewed and attested to.  Penni Homans, MD

## 2018-08-19 NOTE — Assessment & Plan Note (Signed)
Doing well Lorazepam prn

## 2018-08-20 ENCOUNTER — Telehealth: Payer: Self-pay | Admitting: *Deleted

## 2018-08-20 LAB — PAIN MGMT, PROFILE 8 W/CONF, U
6 ACETYLMORPHINE: NEGATIVE ng/mL (ref <10)
ALCOHOL METABOLITES: NEGATIVE ng/mL (ref <500)
Amphetamines: NEGATIVE ng/mL (ref <500)
BENZODIAZEPINES: NEGATIVE ng/mL (ref <100)
Buprenorphine, Urine: NEGATIVE ng/mL (ref <5)
COCAINE METABOLITE: NEGATIVE ng/mL (ref <150)
CREATININE: 127.4 mg/dL
MDMA: NEGATIVE ng/mL (ref <500)
Marijuana Metabolite: NEGATIVE ng/mL (ref <20)
OPIATES: NEGATIVE ng/mL (ref <100)
Oxidant: NEGATIVE ug/mL (ref <200)
Oxycodone: NEGATIVE ng/mL (ref <100)
PH: 5.52 (ref 4.5–9.0)

## 2018-08-20 NOTE — Telephone Encounter (Signed)
Patient came into office to pickup paperwork DSS form.  Form given to patient and copy sent for scan.

## 2018-08-24 LAB — CYTOLOGY - PAP: DIAGNOSIS: NEGATIVE

## 2018-09-04 ENCOUNTER — Other Ambulatory Visit: Payer: Self-pay | Admitting: Family Medicine

## 2018-09-04 DIAGNOSIS — E669 Obesity, unspecified: Principal | ICD-10-CM

## 2018-09-04 DIAGNOSIS — E1169 Type 2 diabetes mellitus with other specified complication: Secondary | ICD-10-CM

## 2018-11-16 ENCOUNTER — Ambulatory Visit: Admitting: Family Medicine

## 2018-11-21 ENCOUNTER — Other Ambulatory Visit: Payer: Self-pay | Admitting: Family Medicine

## 2018-12-03 ENCOUNTER — Ambulatory Visit: Admitting: Family Medicine

## 2018-12-09 ENCOUNTER — Ambulatory Visit: Admitting: Family Medicine

## 2018-12-15 ENCOUNTER — Other Ambulatory Visit: Payer: Self-pay | Admitting: Family Medicine

## 2018-12-15 ENCOUNTER — Encounter: Payer: Self-pay | Admitting: Family Medicine

## 2018-12-15 MED ORDER — CEFDINIR 300 MG PO CAPS: 300.0000 mg | ORAL_CAPSULE | Freq: Two times a day (BID) | ORAL | 0 refills | Status: AC

## 2018-12-16 NOTE — Telephone Encounter (Signed)
[facility] calling, wanting clarification on number of dosages of omnicef as it is written for 10 days but only 14pills of supply ordered. Author relayed need for 20 quantity to pharmacy for 10 days and pharmacist verbalized understanding.

## 2019-01-06 ENCOUNTER — Encounter: Payer: Self-pay | Admitting: Family Medicine

## 2019-01-06 ENCOUNTER — Ambulatory Visit (INDEPENDENT_AMBULATORY_CARE_PROVIDER_SITE_OTHER): Admitting: Family Medicine

## 2019-01-06 VITALS — BP 146/78 | HR 64 | Temp 99.2°F | Resp 18 | Ht 70.0 in | Wt 312.0 lb

## 2019-01-06 DIAGNOSIS — F419 Anxiety disorder, unspecified: Secondary | ICD-10-CM

## 2019-01-06 DIAGNOSIS — E1169 Type 2 diabetes mellitus with other specified complication: Secondary | ICD-10-CM | POA: Diagnosis not present

## 2019-01-06 DIAGNOSIS — J209 Acute bronchitis, unspecified: Secondary | ICD-10-CM

## 2019-01-06 DIAGNOSIS — T7840XD Allergy, unspecified, subsequent encounter: Secondary | ICD-10-CM

## 2019-01-06 DIAGNOSIS — E782 Mixed hyperlipidemia: Secondary | ICD-10-CM

## 2019-01-06 DIAGNOSIS — Z79899 Other long term (current) drug therapy: Secondary | ICD-10-CM

## 2019-01-06 DIAGNOSIS — I10 Essential (primary) hypertension: Secondary | ICD-10-CM | POA: Diagnosis not present

## 2019-01-06 DIAGNOSIS — E669 Obesity, unspecified: Secondary | ICD-10-CM

## 2019-01-06 DIAGNOSIS — N952 Postmenopausal atrophic vaginitis: Secondary | ICD-10-CM | POA: Insufficient documentation

## 2019-01-06 LAB — TSH: TSH: 2.07 u[IU]/mL (ref 0.35–4.50)

## 2019-01-06 LAB — COMPREHENSIVE METABOLIC PANEL
ALT: 14 U/L (ref 0–35)
AST: 13 U/L (ref 0–37)
Albumin: 3.6 g/dL (ref 3.5–5.2)
Alkaline Phosphatase: 109 U/L (ref 39–117)
BUN: 19 mg/dL (ref 6–23)
CO2: 27 mEq/L (ref 19–32)
Calcium: 8.5 mg/dL (ref 8.4–10.5)
Chloride: 102 mEq/L (ref 96–112)
Creatinine, Ser: 0.86 mg/dL (ref 0.40–1.20)
GFR: 74.71 mL/min (ref >60.00)
GLUCOSE: 145 mg/dL — AB (ref 70–99)
Potassium: 4.9 mEq/L (ref 3.5–5.1)
Sodium: 137 mEq/L (ref 135–145)
Total Bilirubin: 0.5 mg/dL (ref 0.2–1.2)
Total Protein: 6.4 g/dL (ref 6.0–8.3)

## 2019-01-06 LAB — LIPID PANEL
Cholesterol: 89 mg/dL (ref 0–200)
HDL: 20.9 mg/dL — ABNORMAL LOW (ref >39.00)
NonHDL: 67.69
Total CHOL/HDL Ratio: 4
Triglycerides: 276 mg/dL — ABNORMAL HIGH (ref 0.0–149.0)
VLDL: 55.2 mg/dL — ABNORMAL HIGH (ref 0.0–40.0)

## 2019-01-06 LAB — CBC
HCT: 36.6 % (ref 36.0–46.0)
HEMOGLOBIN: 12.1 g/dL (ref 12.0–15.0)
MCHC: 33.1 g/dL (ref 30.0–36.0)
MCV: 90.1 fl (ref 78.0–100.0)
Platelets: 287 10*3/uL (ref 150.0–400.0)
RBC: 4.06 Mil/uL (ref 3.87–5.11)
RDW: 12.6 % (ref 11.5–15.5)
WBC: 8.4 10*3/uL (ref 4.0–10.5)

## 2019-01-06 LAB — LDL CHOLESTEROL, DIRECT: Direct LDL: 38 mg/dL

## 2019-01-06 LAB — HEMOGLOBIN A1C: HEMOGLOBIN A1C: 8.6 % — AB (ref 4.6–6.5)

## 2019-01-06 MED ORDER — METOPROLOL TARTRATE 50 MG PO TABS: 50.0000 mg | ORAL_TABLET | Freq: Two times a day (BID) | ORAL | 1 refills | Status: DC

## 2019-01-06 MED ORDER — LISINOPRIL 20 MG PO TABS: 20.0000 mg | ORAL_TABLET | Freq: Two times a day (BID) | ORAL | 1 refills | Status: DC

## 2019-01-06 MED ORDER — ATORVASTATIN CALCIUM 40 MG PO TABS: ORAL_TABLET | ORAL | 1 refills | Status: DC

## 2019-01-06 MED ORDER — ESTROGENS, CONJUGATED 0.625 MG/GM VA CREA: TOPICAL_CREAM | VAGINAL | 1 refills | Status: DC

## 2019-01-06 MED ORDER — GABAPENTIN 300 MG PO CAPS: ORAL_CAPSULE | ORAL | 1 refills | Status: DC

## 2019-01-06 MED ORDER — METFORMIN HCL ER 750 MG PO TB24: 750.0000 mg | ORAL_TABLET | Freq: Three times a day (TID) | ORAL | 1 refills | Status: DC

## 2019-01-06 MED ORDER — TRIAMTERENE-HCTZ 37.5-25 MG PO TABS: 1.0000 | ORAL_TABLET | Freq: Every day | ORAL | 1 refills | Status: DC

## 2019-01-06 MED ORDER — ALBUTEROL SULFATE HFA 108 (90 BASE) MCG/ACT IN AERS: 2.0000 | INHALATION_SPRAY | Freq: Four times a day (QID) | RESPIRATORY_TRACT | 5 refills | Status: DC | PRN

## 2019-01-06 MED ORDER — LORAZEPAM 0.5 MG PO TABS: ORAL_TABLET | ORAL | 2 refills | Status: DC

## 2019-01-06 MED ORDER — GLIPIZIDE 10 MG PO TABS: 10.0000 mg | ORAL_TABLET | Freq: Two times a day (BID) | ORAL | 4 refills | Status: DC

## 2019-01-06 MED ORDER — MONTELUKAST SODIUM 10 MG PO TABS: 10.0000 mg | ORAL_TABLET | Freq: Every day | ORAL | 3 refills | Status: DC

## 2019-01-06 MED ORDER — SITAGLIPTIN PHOSPHATE 100 MG PO TABS: 100.0000 mg | ORAL_TABLET | Freq: Every day | ORAL | 4 refills | Status: DC

## 2019-01-06 NOTE — Patient Instructions (Signed)
Carbohydrate Counting for Diabetes Mellitus, Adult  Carbohydrate counting is a method of keeping track of how many carbohydrates you eat. Eating carbohydrates naturally increases the amount of sugar (glucose) in the blood. Counting how many carbohydrates you eat helps keep your blood glucose within normal limits, which helps you manage your diabetes (diabetes mellitus). It is important to know how many carbohydrates you can safely have in each meal. This is different for every person. A diet and nutrition specialist (registered dietitian) can help you make a meal plan and calculate how many carbohydrates you should have at each meal and snack. Carbohydrates are found in the following foods:  Grains, such as breads and cereals.  Dried beans and soy products.  Starchy vegetables, such as potatoes, peas, and corn.  Fruit and fruit juices.  Milk and yogurt.  Sweets and snack foods, such as cake, cookies, candy, chips, and soft drinks. How do I count carbohydrates? There are two ways to count carbohydrates in food. You can use either of the methods or a combination of both. Reading "Nutrition Facts" on packaged food The "Nutrition Facts" list is included on the labels of almost all packaged foods and beverages in the [place] It includes:  The serving size.  Information about nutrients in each serving, including the grams (g) of carbohydrate per serving. To use the "Nutrition Facts":  Decide how many servings you will have.  Multiply the number of servings by the number of carbohydrates per serving.  The resulting number is the total amount of carbohydrates that you will be having. Learning standard serving sizes of other foods When you eat carbohydrate foods that are not packaged or do not include "Nutrition Facts" on the label, you need to measure the servings in order to count the amount of carbohydrates:  Measure the foods that you will eat with a food scale or measuring cup, if needed.   Decide how many standard-size servings you will eat.  Multiply the number of servings by 15. Most carbohydrate-rich foods have about 15 g of carbohydrates per serving. ? For example, if you eat 8 oz (170 g) of strawberries, you will have eaten 2 servings and 30 g of carbohydrates (2 servings x 15 g = 30 g).  For foods that have more than one food mixed, such as soups and casseroles, you must count the carbohydrates in each food that is included. The following list contains standard serving sizes of common carbohydrate-rich foods. Each of these servings has about 15 g of carbohydrates:   hamburger bun or  English muffin.   oz (15 mL) syrup.   oz (14 g) jelly.  1 slice of bread.  1 six-inch tortilla.  3 oz (85 g) cooked rice or pasta.  4 oz (113 g) cooked dried beans.  4 oz (113 g) starchy vegetable, such as peas, corn, or potatoes.  4 oz (113 g) hot cereal.  4 oz (113 g) mashed potatoes or  of a large baked potato.  4 oz (113 g) canned or frozen fruit.  4 oz (120 mL) fruit juice.  4-6 crackers.  6 chicken nuggets.  6 oz (170 g) unsweetened dry cereal.  6 oz (170 g) plain fat-free yogurt or yogurt sweetened with artificial sweeteners.  8 oz (240 mL) milk.  8 oz (170 g) fresh fruit or one small piece of fruit.  24 oz (680 g) popped popcorn. Example of carbohydrate counting Sample meal  3 oz (85 g) chicken breast.  6 oz (170 g)   brown rice.  4 oz (113 g) corn.  8 oz (240 mL) milk.  8 oz (170 g) strawberries with sugar-free whipped topping. Carbohydrate calculation 1. Identify the foods that contain carbohydrates: ? Rice. ? Corn. ? Milk. ? Strawberries. 2. Calculate how many servings you have of each food: ? 2 servings rice. ? 1 serving corn. ? 1 serving milk. ? 1 serving strawberries. 3. Multiply each number of servings by 15 g: ? 2 servings rice x 15 g = 30 g. ? 1 serving corn x 15 g = 15 g. ? 1 serving milk x 15 g = 15 g. ? 1 serving  strawberries x 15 g = 15 g. 4. Add together all of the amounts to find the total grams of carbohydrates eaten: ? 30 g + 15 g + 15 g + 15 g = 75 g of carbohydrates total. Summary  Carbohydrate counting is a method of keeping track of how many carbohydrates you eat.  Eating carbohydrates naturally increases the amount of sugar (glucose) in the blood.  Counting how many carbohydrates you eat helps keep your blood glucose within normal limits, which helps you manage your diabetes.  A diet and nutrition specialist (registered dietitian) can help you make a meal plan and calculate how many carbohydrates you should have at each meal and snack. This information is not intended to replace advice given to you by your health care provider. Make sure you discuss any questions you have with your health care provider. Document Released: 12/15/2005 Document Revised: 06/24/2017 Document Reviewed: 05/28/2016 Elsevier Interactive Patient Education  2019 [employer]  

## 2019-01-06 NOTE — Assessment & Plan Note (Signed)
Well controlled, no changes to meds. Encouraged heart healthy diet such as the DASH diet and exercise as tolerated.  °

## 2019-01-06 NOTE — Assessment & Plan Note (Signed)
Will try Premarin cream pv 1 to 3 x weekly

## 2019-01-06 NOTE — Assessment & Plan Note (Signed)
hgba1c acceptable, minimize simple carbs. Increase exercise as tolerated. Continue current meds 

## 2019-01-06 NOTE — Assessment & Plan Note (Signed)
Tolerating statin, encouraged heart healthy diet, avoid trans fats, minimize simple carbs and saturated fats. Increase exercise as tolerated 

## 2019-01-06 NOTE — Assessment & Plan Note (Signed)
Continues to use Lorazepam sparingly with good results. Allowed refill today.

## 2019-01-06 NOTE — Progress Notes (Signed)
Subjective:    Patient ID: April Warren, female    DOB: October 25, 1971, 48 y.o.   MRN: [id number]  No chief complaint on file.   HPI Patient is in today for follow up. Her adoptive son is now 70 months old and he is sleeping better. She is feeling more rested but she has had to help other sick family members so she has been under a great deal of stress and she is not always eating well and taking care of herself. She is worried that her sugar is up but she has not been checking it. She denies any polyuria or polydipsia.   Past Medical History:  Diagnosis Date  . Allergy   . Chicken pox 48 yrs old  . Diabetes mellitus 08-12-12   type 2- dr Zigmund Daniel diagnosed  . Diabetic retinopathy ([place])   . Dysmenorrhea 08/20/2012  . HTN (hypertension) 08/20/2012  . Hyperlipidemia, mixed 08/28/2013  . Neuropathy 02/16/2017  . Obesity   . Obesity, unspecified 08/28/2013  . Other and unspecified hyperlipidemia 08/28/2013  . Preventative health care 08/20/2012  . Sinusitis 11/14/2013  . Sinusitis, acute 05/02/2016    Past Surgical History:  Procedure Laterality Date  . REFRACTIVE SURGERY  08-12-12   right, left on 09/16/12    Family History  Problem Relation Age of Onset  . Cancer Mother 32       breast  . Diabetes Mother        type 2  . Hypertension Mother   . Hypertension Father   . Hyperlipidemia Father   . COPD Father   . Hypertension Maternal Grandmother   . Hyperlipidemia Maternal Grandmother   . COPD Maternal Grandmother   . Asthma Maternal Grandmother   . Hypertension Maternal Grandfather   . Heart attack Maternal Grandfather   . Gout Brother   . Cancer Brother        lung  . Heart disease Paternal Grandfather     Social History   Socioeconomic History  . Marital status: Married    Spouse name: Not on file  . Number of children: Not on file  . Years of education: Not on file  . Highest education level: Not on file  Occupational History  . Not on file  Social Needs  .  Financial resource strain: Not on file  . Food insecurity:    Worry: Not on file    Inability: Not on file  . Transportation needs:    Medical: Not on file    Non-medical: Not on file  Tobacco Use  . Smoking status: Never Smoker  . Smokeless tobacco: Never Used  Substance and Sexual Activity  . Alcohol use: Yes    Comment: socially  . Drug use: No  . Sexual activity: Yes    Partners: Male  Lifestyle  . Physical activity:    Days per week: Not on file    Minutes per session: Not on file  . Stress: Not on file  Relationships  . Social connections:    Talks on phone: Not on file    Gets together: Not on file    Attends religious service: Not on file    Active member of club or organization: Not on file    Attends meetings of clubs or organizations: Not on file    Relationship status: Not on file  . Intimate partner violence:    Fear of current or ex partner: Not on file    Emotionally abused: Not on file  Physically abused: Not on file    Forced sexual activity: Not on file  Other Topics Concern  . Not on file  Social History Narrative   Lives with husband.    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    Not exercising    Outpatient Medications Prior to Visit  Medication Sig Dispense Refill  . Blood Glucose Monitoring Suppl (FREESTYLE FREEDOM LITE) w/Device KIT Check blood sugar twice daily 1 each 0  . glucose blood test strip Use as instructed 100 each 12  . ibuprofen (ADVIL,MOTRIN) 200 MG tablet Take 400 mg by mouth every 8 (eight) hours as needed.    . Lancets (FREESTYLE) lancets Check blood sugar twice daily 200 each 5  . albuterol (PROVENTIL HFA;VENTOLIN HFA) 108 (90 Base) MCG/ACT inhaler Inhale 2 puffs into the lungs every 6 (six) hours as needed for wheezing or shortness of breath. 1 Inhaler 5  . atorvastatin (LIPITOR) 40 MG tablet TAKE ONE AND ONE-HALF TABLETS  DAILY 135 tablet 1  . gabapentin (NEURONTIN) 300 MG capsule TAKE 1 CAPSULE TWICE A DAY AND 2 CAPSULES AT BEDTIME 360 capsule 1  . glipiZIDE (GLUCOTROL) 10 MG tablet TAKE 1 TABLET TWICE A DAY 180 tablet 4  . JANUVIA 100 MG tablet TAKE 1 TABLET DAILY 90 tablet 4  . lisinopril (PRINIVIL,ZESTRIL) 20 MG tablet TAKE 1 TABLET TWICE A DAY 180 tablet 1  . LORazepam (ATIVAN) 0.5 MG tablet Take 1 tablet by mouth 2 times a day as needed for anxiety or sleep 40 tablet 2  . metFORMIN (GLUCOPHAGE-XR) 750 MG 24 hr tablet TAKE 1 TABLET THREE TIMES A DAY 270 tablet 1  . metoprolol tartrate (LOPRESSOR) 50 MG tablet TAKE 1 TABLET TWICE A DAY 180 tablet 1  . montelukast (SINGULAIR) 10 MG tablet Take 1 tablet (10 mg total) by mouth at bedtime. 90 tablet 3  . triamterene-hydrochlorothiazide (MAXZIDE-25) 37.5-25 MG tablet TAKE 1 TABLET DAILY 90 tablet 1   No facility-administered medications prior to visit.     No Known Allergies  Review of Systems  Constitutional: Positive for malaise/fatigue. Negative for fever.  HENT: Negative for congestion.   Eyes: Negative for blurred vision.  Respiratory: Negative for shortness of breath.   Cardiovascular: Negative for chest pain, palpitations and leg swelling.  Gastrointestinal: Negative for abdominal pain, blood in stool and nausea.  Genitourinary: Negative for dysuria and frequency.  Musculoskeletal: Negative for falls.  Skin: Negative for rash.  Neurological: Negative for dizziness, loss of consciousness and headaches.  Endo/Heme/Allergies: Negative for environmental allergies.  Psychiatric/Behavioral: Negative for depression. The patient is not nervous/anxious.        Objective:    Physical Exam Vitals signs and nursing note reviewed.  Constitutional:      General: She is not in acute distress.    Appearance: She is well-developed.  HENT:     Head: Normocephalic and atraumatic.     Nose: Nose normal.  Eyes:     General:        Right eye: No discharge.         Left eye: No discharge.  Neck:     Musculoskeletal: Normal range of motion and neck supple.  Cardiovascular:     Rate and Rhythm: Normal rate and regular rhythm.     Heart sounds: No murmur.  Pulmonary:     Effort: Pulmonary effort is normal.  Breath sounds: Normal breath sounds.  Abdominal:     General: Bowel sounds are normal.     Palpations: Abdomen is soft.     Tenderness: There is no abdominal tenderness.  Skin:    General: Skin is warm and dry.  Neurological:     Mental Status: She is alert and oriented to person, place, and time.     BP (!) 146/78 (BP Location: Left Arm, Patient Position: Sitting, Cuff Size: Normal)   Pulse 64   Temp 99.2 F (37.3 C) (Oral)   Resp 18   Ht 5' 10"  (1.778 m)   Wt (!) 312 lb (141.5 kg)   SpO2 98%   BMI 44.77 kg/m  Wt Readings from Last 3 Encounters:  01/06/19 (!) 312 lb (141.5 kg)  08/19/18 (!) 312 lb 9.6 oz (141.8 kg)  05/20/18 (!) 313 lb 9.6 oz (142.2 kg)     Lab Results  Component Value Date   WBC 8.4 01/06/2019   HGB 12.1 01/06/2019   HCT 36.6 01/06/2019   PLT 287.0 01/06/2019   GLUCOSE 145 (H) 01/06/2019   CHOL 89 01/06/2019   TRIG 276.0 (H) 01/06/2019   HDL 20.90 (L) 01/06/2019   LDLDIRECT 38.0 01/06/2019   LDLCALC 13 10/13/2017   ALT 14 01/06/2019   AST 13 01/06/2019   NA 137 01/06/2019   K 4.9 01/06/2019   CL 102 01/06/2019   CREATININE 0.86 01/06/2019   BUN 19 01/06/2019   CO2 27 01/06/2019   TSH 2.07 01/06/2019   INR 1.03 03/04/2011   HGBA1C 8.6 (H) 01/06/2019   MICROALBUR 19.6 (H) 01/29/2016    Lab Results  Component Value Date   TSH 2.07 01/06/2019   Lab Results  Component Value Date   WBC 8.4 01/06/2019   HGB 12.1 01/06/2019   HCT 36.6 01/06/2019   MCV 90.1 01/06/2019   PLT 287.0 01/06/2019   Lab Results  Component Value Date   NA 137 01/06/2019   K 4.9 01/06/2019   CO2 27 01/06/2019   GLUCOSE 145 (H) 01/06/2019   BUN 19 01/06/2019   CREATININE 0.86 01/06/2019   BILITOT 0.5  01/06/2019   ALKPHOS 109 01/06/2019   AST 13 01/06/2019   ALT 14 01/06/2019   PROT 6.4 01/06/2019   ALBUMIN 3.6 01/06/2019   CALCIUM 8.5 01/06/2019   GFR 74.71 01/06/2019   Lab Results  Component Value Date   CHOL 89 01/06/2019   Lab Results  Component Value Date   HDL 20.90 (L) 01/06/2019   Lab Results  Component Value Date   LDLCALC 13 10/13/2017   Lab Results  Component Value Date   TRIG 276.0 (H) 01/06/2019   Lab Results  Component Value Date   CHOLHDL 4 01/06/2019   Lab Results  Component Value Date   HGBA1C 8.6 (H) 01/06/2019       Assessment & Plan:   Problem List Items Addressed This Visit    Diabetes mellitus type 2 in obese ([place])    hgba1c acceptable, minimize simple carbs. Increase exercise as tolerated. Continue current meds      Relevant Medications   lisinopril (PRINIVIL,ZESTRIL) 20 MG tablet   metFORMIN (GLUCOPHAGE-XR) 750 MG 24 hr tablet   sitaGLIPtin (JANUVIA) 100 MG tablet   atorvastatin (LIPITOR) 40 MG tablet   glipiZIDE (GLUCOTROL) 10 MG tablet   Other Relevant Orders   Hemoglobin A1c (Completed)   Obesity    Encouraged DASH diet, decrease po intake and increase exercise as tolerated. Needs 7-8  hours of sleep nightly. Avoid trans fats, eat small, frequent meals every 4-5 hours with lean proteins, complex carbs and healthy fats. Minimize simple carbs      Relevant Medications   metFORMIN (GLUCOPHAGE-XR) 750 MG 24 hr tablet   sitaGLIPtin (JANUVIA) 100 MG tablet   glipiZIDE (GLUCOTROL) 10 MG tablet   HTN (hypertension)    Well controlled, no changes to meds. Encouraged heart healthy diet such as the DASH diet and exercise as tolerated.       Relevant Medications   lisinopril (PRINIVIL,ZESTRIL) 20 MG tablet   metoprolol tartrate (LOPRESSOR) 50 MG tablet   triamterene-hydrochlorothiazide (MAXZIDE-25) 37.5-25 MG tablet   atorvastatin (LIPITOR) 40 MG tablet   Other Relevant Orders   CBC (Completed)   Comprehensive metabolic panel  (Completed)   TSH (Completed)   Hyperlipidemia, mixed    Tolerating statin, encouraged heart healthy diet, avoid trans fats, minimize simple carbs and saturated fats. Increase exercise as tolerated      Relevant Medications   lisinopril (PRINIVIL,ZESTRIL) 20 MG tablet   metoprolol tartrate (LOPRESSOR) 50 MG tablet   triamterene-hydrochlorothiazide (MAXZIDE-25) 37.5-25 MG tablet   atorvastatin (LIPITOR) 40 MG tablet   Other Relevant Orders   Lipid panel (Completed)   LDL cholesterol, direct (Completed)   Anxiety    Continues to use Lorazepam sparingly with good results. Allowed refill today.      Relevant Medications   LORazepam (ATIVAN) 0.5 MG tablet   Atrophic vaginitis    Will try Premarin cream pv 1 to 3 x weekly       Other Visit Diagnoses    High risk medication use    -  Primary   Relevant Orders   Pain Mgmt, Profile 8 w/Conf, U   Allergic state, subsequent encounter       Acute bronchitis, unspecified organism          I have changed Niti Michna "Loleta"'s JANUVIA to sitaGLIPtin. I have also changed her lisinopril, metoprolol tartrate, metFORMIN, triamterene-hydrochlorothiazide, and glipiZIDE. I am also having her start on conjugated estrogens. Additionally, I am having her maintain her ibuprofen, FREESTYLE FREEDOM LITE, freestyle, glucose blood, gabapentin, LORazepam, atorvastatin, montelukast, and albuterol.  Meds ordered this encounter  Medications  . lisinopril (PRINIVIL,ZESTRIL) 20 MG tablet    Sig: Take 1 tablet (20 mg total) by mouth 2 (two) times daily.    Dispense:  180 tablet    Refill:  1  . gabapentin (NEURONTIN) 300 MG capsule    Sig: TAKE 1 CAPSULE TWICE A DAY AND 2 CAPSULES AT BEDTIME    Dispense:  360 capsule    Refill:  1  . LORazepam (ATIVAN) 0.5 MG tablet    Sig: Take 1 tablet by mouth 2 times a day as needed for anxiety or sleep    Dispense:  40 tablet    Refill:  2  . metoprolol tartrate (LOPRESSOR) 50 MG tablet    Sig: Take 1 tablet (50  mg total) by mouth 2 (two) times daily.    Dispense:  180 tablet    Refill:  1  . metFORMIN (GLUCOPHAGE-XR) 750 MG 24 hr tablet    Sig: Take 1 tablet (750 mg total) by mouth 3 (three) times daily.    Dispense:  270 tablet    Refill:  1  . sitaGLIPtin (JANUVIA) 100 MG tablet    Sig: Take 1 tablet (100 mg total) by mouth daily.    Dispense:  90 tablet    Refill:  4  .  triamterene-hydrochlorothiazide (MAXZIDE-25) 37.5-25 MG tablet    Sig: Take 1 tablet by mouth daily.    Dispense:  90 tablet    Refill:  1  . atorvastatin (LIPITOR) 40 MG tablet    Sig: TAKE ONE AND ONE-HALF TABLETS DAILY    Dispense:  135 tablet    Refill:  1  . montelukast (SINGULAIR) 10 MG tablet    Sig: Take 1 tablet (10 mg total) by mouth at bedtime.    Dispense:  90 tablet    Refill:  3  . albuterol (PROVENTIL HFA;VENTOLIN HFA) 108 (90 Base) MCG/ACT inhaler    Sig: Inhale 2 puffs into the lungs every 6 (six) hours as needed for wheezing or shortness of breath.    Dispense:  1 Inhaler    Refill:  5  . glipiZIDE (GLUCOTROL) 10 MG tablet    Sig: Take 1 tablet (10 mg total) by mouth 2 (two) times daily.    Dispense:  180 tablet    Refill:  4  . conjugated estrogens (PREMARIN) vaginal cream    Sig: Small amount topically to vaginal mucosa 3 x weekly    Dispense:  42.5 g    Refill:  1     Penni Homans, MD

## 2019-01-06 NOTE — Assessment & Plan Note (Signed)
Encouraged DASH diet, decrease po intake and increase exercise as tolerated. Needs 7-8 hours of sleep nightly. Avoid trans fats, eat small, frequent meals every 4-5 hours with lean proteins, complex carbs and healthy fats. Minimize simple carbs 

## 2019-01-07 LAB — PAIN MGMT, PROFILE 8 W/CONF, U
6 Acetylmorphine: NEGATIVE ng/mL (ref <10)
Alcohol Metabolites: NEGATIVE ng/mL (ref <500)
Amphetamines: NEGATIVE ng/mL (ref <500)
Benzodiazepines: NEGATIVE ng/mL (ref <100)
Buprenorphine, Urine: NEGATIVE ng/mL (ref <5)
Cocaine Metabolite: NEGATIVE ng/mL (ref <150)
Creatinine: 57.9 mg/dL
MDMA: NEGATIVE ng/mL (ref <500)
Marijuana Metabolite: NEGATIVE ng/mL (ref <20)
Opiates: NEGATIVE ng/mL (ref <100)
Oxidant: NEGATIVE ug/mL (ref <200)
Oxycodone: NEGATIVE ng/mL (ref <100)
pH: 6.25 (ref 4.5–9.0)

## 2019-04-18 ENCOUNTER — Other Ambulatory Visit: Payer: Self-pay

## 2019-04-18 ENCOUNTER — Ambulatory Visit (INDEPENDENT_AMBULATORY_CARE_PROVIDER_SITE_OTHER): Admitting: Family Medicine

## 2019-04-18 VITALS — BP 146/72 | HR 70 | Wt 315.0 lb

## 2019-04-18 DIAGNOSIS — E1169 Type 2 diabetes mellitus with other specified complication: Secondary | ICD-10-CM

## 2019-04-18 DIAGNOSIS — E669 Obesity, unspecified: Secondary | ICD-10-CM

## 2019-04-18 DIAGNOSIS — E782 Mixed hyperlipidemia: Secondary | ICD-10-CM

## 2019-04-18 DIAGNOSIS — I10 Essential (primary) hypertension: Secondary | ICD-10-CM

## 2019-04-18 DIAGNOSIS — F419 Anxiety disorder, unspecified: Secondary | ICD-10-CM | POA: Diagnosis not present

## 2019-04-18 MED ORDER — METOPROLOL TARTRATE 50 MG PO TABS: 50.0000 mg | ORAL_TABLET | Freq: Two times a day (BID) | ORAL | 1 refills | Status: DC

## 2019-04-18 MED ORDER — ATORVASTATIN CALCIUM 40 MG PO TABS: ORAL_TABLET | ORAL | 1 refills | Status: DC

## 2019-04-18 MED ORDER — LISINOPRIL 20 MG PO TABS: 20.0000 mg | ORAL_TABLET | Freq: Two times a day (BID) | ORAL | 1 refills | Status: DC

## 2019-04-18 MED ORDER — ESTROGENS, CONJUGATED 0.625 MG/GM VA CREA: TOPICAL_CREAM | VAGINAL | 1 refills | Status: DC

## 2019-04-18 MED ORDER — GABAPENTIN 300 MG PO CAPS: ORAL_CAPSULE | ORAL | 1 refills | Status: DC

## 2019-04-18 MED ORDER — DULAGLUTIDE 0.75 MG/0.5ML ~~LOC~~ SOAJ: 0.7500 mg | SUBCUTANEOUS | 0 refills | Status: DC

## 2019-04-18 MED ORDER — MONTELUKAST SODIUM 10 MG PO TABS: 10.0000 mg | ORAL_TABLET | Freq: Every day | ORAL | 3 refills | Status: DC

## 2019-04-18 MED ORDER — LORAZEPAM 0.5 MG PO TABS: ORAL_TABLET | ORAL | 2 refills | Status: DC

## 2019-04-18 MED ORDER — TRIAMTERENE-HCTZ 37.5-25 MG PO TABS: 1.0000 | ORAL_TABLET | Freq: Every day | ORAL | 1 refills | Status: DC

## 2019-04-18 MED ORDER — METFORMIN HCL ER 750 MG PO TB24: 750.0000 mg | ORAL_TABLET | Freq: Three times a day (TID) | ORAL | 1 refills | Status: DC

## 2019-04-18 MED ORDER — SITAGLIPTIN PHOSPHATE 100 MG PO TABS: 100.0000 mg | ORAL_TABLET | Freq: Every day | ORAL | 4 refills | Status: DC

## 2019-04-18 NOTE — Assessment & Plan Note (Signed)
She was unable to go previously but feels she could go now. Referred back to healthy weight and wellness

## 2019-04-18 NOTE — Assessment & Plan Note (Signed)
Well controlled most of the time, no changes to meds. Encouraged heart healthy diet such as the DASH diet and exercise as tolerated. Mild elevation today will continue to monitor

## 2019-04-18 NOTE — Assessment & Plan Note (Signed)
maintain heart healthy diet, increase exercise, avoid trans fats 

## 2019-04-18 NOTE — Assessment & Plan Note (Signed)
Sugars still running high, 140 this am but often 160s, hgba1c not acceptable, minimize simple carbs. Increase exercise as tolerated. Continue current meds but add Trulicity 0.85 mg Sobieski weekly only 20 dollar copay per insurance

## 2019-04-18 NOTE — Progress Notes (Signed)
Virtual Visit via Video Note  I connected with April Warren on 04/18/19 at  9:00 AM EDT by a video enabled telemedicine application and verified that I am speaking with the correct person using two identifiers.   I discussed the limitations of evaluation and management by telemedicine and the availability of in person appointments. The patient expressed understanding and agreed to proceed. April Warren CMA to get patient on video visit.    Subjective:    Patient ID: April Warren, female    DOB: 05-17-1971, 48 y.o.   MRN: [id number]  No chief complaint on file.   HPI Patient is in today for follow up on chronic medical concerns including diabetes, hypertension, obesity, anxiety and hyperlipidemia. She is staying under quarantine well with her 32 month old so staying very busy. No recent febrile illness or hospitalizations. No polyuria or polydipsia. Is noting blood sugars ranging from 140s to 170s. Is trying to minimize simple carbs. Is walking most days.Denies CP/palp/SOB/HA/congestion/fevers/GI or GU c/o. Taking meds as prescribed  Past Medical History:  Diagnosis Date  . Allergy   . Chicken pox 48 yrs old  . Diabetes mellitus 08-12-12   type 2- dr Zigmund Daniel diagnosed  . Diabetic retinopathy ([place])   . Dysmenorrhea 08/20/2012  . HTN (hypertension) 08/20/2012  . Hyperlipidemia, mixed 08/28/2013  . Neuropathy 02/16/2017  . Obesity   . Obesity, unspecified 08/28/2013  . Other and unspecified hyperlipidemia 08/28/2013  . Preventative health care 08/20/2012  . Sinusitis 11/14/2013  . Sinusitis, acute 05/02/2016    Past Surgical History:  Procedure Laterality Date  . REFRACTIVE SURGERY  08-12-12   right, left on 09/16/12    Family History  Problem Relation Age of Onset  . Cancer Mother 55       breast  . Diabetes Mother        type 2  . Hypertension Mother   . Hypertension Father   . Hyperlipidemia Father   . COPD Father   . Hypertension Maternal Grandmother   . Hyperlipidemia  Maternal Grandmother   . COPD Maternal Grandmother   . Asthma Maternal Grandmother   . Hypertension Maternal Grandfather   . Heart attack Maternal Grandfather   . Gout Brother   . Cancer Brother        lung  . Heart disease Paternal Grandfather     Social History   Socioeconomic History  . Marital status: Married    Spouse name: Not on file  . Number of children: Not on file  . Years of education: Not on file  . Highest education level: Not on file  Occupational History  . Not on file  Social Needs  . Financial resource strain: Not on file  . Food insecurity:    Worry: Not on file    Inability: Not on file  . Transportation needs:    Medical: Not on file    Non-medical: Not on file  Tobacco Use  . Smoking status: Never Smoker  . Smokeless tobacco: Never Used  Substance and Sexual Activity  . Alcohol use: Yes    Comment: socially  . Drug use: No  . Sexual activity: Yes    Partners: Male  Lifestyle  . Physical activity:    Days per week: Not on file    Minutes per session: Not on file  . Stress: Not on file  Relationships  . Social connections:    Talks on phone: Not on file    Gets together: Not on file  Attends religious service: Not on file    Active member of club or organization: Not on file    Attends meetings of clubs or organizations: Not on file    Relationship status: Not on file  . Intimate partner violence:    Fear of current or ex partner: Not on file    Emotionally abused: Not on file    Physically abused: Not on file    Forced sexual activity: Not on file  Other Topics Concern  . Not on file  Social History Narrative   Lives with husband.    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    Not exercising    Outpatient Medications Prior to Visit  Medication Sig Dispense Refill  . albuterol (PROVENTIL HFA;VENTOLIN HFA) 108 (90 Base) MCG/ACT  inhaler Inhale 2 puffs into the lungs every 6 (six) hours as needed for wheezing or shortness of breath. 1 Inhaler 5  . Blood Glucose Monitoring Suppl (FREESTYLE FREEDOM LITE) w/Device KIT Check blood sugar twice daily 1 each 0  . glipiZIDE (GLUCOTROL) 10 MG tablet Take 1 tablet (10 mg total) by mouth 2 (two) times daily. 180 tablet 4  . glucose blood test strip Use as instructed 100 each 12  . ibuprofen (ADVIL,MOTRIN) 200 MG tablet Take 400 mg by mouth every 8 (eight) hours as needed.    . Lancets (FREESTYLE) lancets Check blood sugar twice daily 200 each 5  . atorvastatin (LIPITOR) 40 MG tablet TAKE ONE AND ONE-HALF TABLETS DAILY 135 tablet 1  . conjugated estrogens (PREMARIN) vaginal cream Small amount topically to vaginal mucosa 3 x weekly 42.5 g 1  . gabapentin (NEURONTIN) 300 MG capsule TAKE 1 CAPSULE TWICE A DAY AND 2 CAPSULES AT BEDTIME 360 capsule 1  . lisinopril (PRINIVIL,ZESTRIL) 20 MG tablet Take 1 tablet (20 mg total) by mouth 2 (two) times daily. 180 tablet 1  . LORazepam (ATIVAN) 0.5 MG tablet Take 1 tablet by mouth 2 times a day as needed for anxiety or sleep 40 tablet 2  . metFORMIN (GLUCOPHAGE-XR) 750 MG 24 hr tablet Take 1 tablet (750 mg total) by mouth 3 (three) times daily. 270 tablet 1  . metoprolol tartrate (LOPRESSOR) 50 MG tablet Take 1 tablet (50 mg total) by mouth 2 (two) times daily. 180 tablet 1  . montelukast (SINGULAIR) 10 MG tablet Take 1 tablet (10 mg total) by mouth at bedtime. 90 tablet 3  . sitaGLIPtin (JANUVIA) 100 MG tablet Take 1 tablet (100 mg total) by mouth daily. 90 tablet 4  . triamterene-hydrochlorothiazide (MAXZIDE-25) 37.5-25 MG tablet Take 1 tablet by mouth daily. 90 tablet 1   No facility-administered medications prior to visit.     No Known Allergies  Review of Systems  Constitutional: Negative for fever and malaise/fatigue.  HENT: Negative for congestion.   Eyes: Negative for blurred vision.  Respiratory: Negative for shortness of breath.    Cardiovascular: Negative for chest pain, palpitations and leg swelling.  Gastrointestinal: Negative for abdominal pain, blood in stool and nausea.  Genitourinary: Negative for dysuria and frequency.  Musculoskeletal: Negative for falls.  Skin: Negative for rash.  Neurological: Negative for dizziness, loss of consciousness and headaches.  Endo/Heme/Allergies: Negative for environmental allergies.  Psychiatric/Behavioral: Negative for depression. The patient is not nervous/anxious.        Objective:    Physical Exam Constitutional:      Appearance: Normal appearance.  She is obese. She is not ill-appearing.  HENT:     Nose: Nose normal.  Pulmonary:     Effort: Pulmonary effort is normal.  Neurological:     Mental Status: She is alert and oriented to person, place, and time.  Psychiatric:        Behavior: Behavior normal.     BP (!) 146/72 (BP Location: Left Arm, Patient Position: Sitting, Cuff Size: Normal) Comment: Taking at home  Pulse 70   Wt (!) 315 lb (142.9 kg)   BMI 45.20 kg/m  Wt Readings from Last 3 Encounters:  04/18/19 (!) 315 lb (142.9 kg)  01/06/19 (!) 312 lb (141.5 kg)  08/19/18 (!) 312 lb 9.6 oz (141.8 kg)    Diabetic Foot Exam - Simple   No data filed     Lab Results  Component Value Date   WBC 8.4 01/06/2019   HGB 12.1 01/06/2019   HCT 36.6 01/06/2019   PLT 287.0 01/06/2019   GLUCOSE 145 (H) 01/06/2019   CHOL 89 01/06/2019   TRIG 276.0 (H) 01/06/2019   HDL 20.90 (L) 01/06/2019   LDLDIRECT 38.0 01/06/2019   LDLCALC 13 10/13/2017   ALT 14 01/06/2019   AST 13 01/06/2019   NA 137 01/06/2019   K 4.9 01/06/2019   CL 102 01/06/2019   CREATININE 0.86 01/06/2019   BUN 19 01/06/2019   CO2 27 01/06/2019   TSH 2.07 01/06/2019   INR 1.03 03/04/2011   HGBA1C 8.6 (H) 01/06/2019   MICROALBUR 19.6 (H) 01/29/2016    Lab Results  Component Value Date   TSH 2.07 01/06/2019   Lab Results  Component Value Date   WBC 8.4 01/06/2019   HGB 12.1  01/06/2019   HCT 36.6 01/06/2019   MCV 90.1 01/06/2019   PLT 287.0 01/06/2019   Lab Results  Component Value Date   NA 137 01/06/2019   K 4.9 01/06/2019   CO2 27 01/06/2019   GLUCOSE 145 (H) 01/06/2019   BUN 19 01/06/2019   CREATININE 0.86 01/06/2019   BILITOT 0.5 01/06/2019   ALKPHOS 109 01/06/2019   AST 13 01/06/2019   ALT 14 01/06/2019   PROT 6.4 01/06/2019   ALBUMIN 3.6 01/06/2019   CALCIUM 8.5 01/06/2019   GFR 74.71 01/06/2019   Lab Results  Component Value Date   CHOL 89 01/06/2019   Lab Results  Component Value Date   HDL 20.90 (L) 01/06/2019   Lab Results  Component Value Date   LDLCALC 13 10/13/2017   Lab Results  Component Value Date   TRIG 276.0 (H) 01/06/2019   Lab Results  Component Value Date   CHOLHDL 4 01/06/2019   Lab Results  Component Value Date   HGBA1C 8.6 (H) 01/06/2019       Assessment & Plan:   Problem List Items Addressed This Visit    Diabetes mellitus type 2 in obese ([place])    Sugars still running high, 140 this am but often 160s, hgba1c not acceptable, minimize simple carbs. Increase exercise as tolerated. Continue current meds but add Trulicity 5.70 mg [place] weekly only 20 dollar copay per insurance      Relevant Medications   lisinopril (ZESTRIL) 20 MG tablet   metFORMIN (GLUCOPHAGE-XR) 750 MG 24 hr tablet   sitaGLIPtin (JANUVIA) 100 MG tablet   atorvastatin (LIPITOR) 40 MG tablet   Dulaglutide (TRULICITY) 1.77 LT/9.0ZE SOPN   Obesity - Primary    She was unable to go previously but feels she could go now.  Referred back to healthy weight and wellness      Relevant Medications   metFORMIN (GLUCOPHAGE-XR) 750 MG 24 hr tablet   sitaGLIPtin (JANUVIA) 100 MG tablet   Dulaglutide (TRULICITY) 4.68 EH/2.1YY SOPN   Other Relevant Orders   Amb Ref to Medical Weight Management   HTN (hypertension)    Well controlled most of the time, no changes to meds. Encouraged heart healthy diet such as the DASH diet and exercise as tolerated.  Mild elevation today will continue to monitor      Relevant Medications   lisinopril (ZESTRIL) 20 MG tablet   metoprolol tartrate (LOPRESSOR) 50 MG tablet   triamterene-hydrochlorothiazide (MAXZIDE-25) 37.5-25 MG tablet   atorvastatin (LIPITOR) 40 MG tablet   Hyperlipidemia, mixed    maintain heart healthy diet, increase exercise, avoid trans fats      Relevant Medications   lisinopril (ZESTRIL) 20 MG tablet   metoprolol tartrate (LOPRESSOR) 50 MG tablet   triamterene-hydrochlorothiazide (MAXZIDE-25) 37.5-25 MG tablet   atorvastatin (LIPITOR) 40 MG tablet   Anxiety   Relevant Medications   LORazepam (ATIVAN) 0.5 MG tablet      I have changed April Gutzmer "Loleta"'s lisinopril. I am also having her start on Dulaglutide. Additionally, I am having her maintain her ibuprofen, FreeStyle [place] Lite, freestyle, glucose blood, albuterol, glipiZIDE, gabapentin, metoprolol tartrate, metFORMIN, sitaGLIPtin, triamterene-hydrochlorothiazide, atorvastatin, montelukast, conjugated estrogens, and LORazepam.  Meds ordered this encounter  Medications  . lisinopril (ZESTRIL) 20 MG tablet    Sig: Take 1 tablet (20 mg total) by mouth 2 (two) times daily.    Dispense:  180 tablet    Refill:  1  . gabapentin (NEURONTIN) 300 MG capsule    Sig: TAKE 1 CAPSULE TWICE A DAY AND 2 CAPSULES AT BEDTIME    Dispense:  360 capsule    Refill:  1  . metoprolol tartrate (LOPRESSOR) 50 MG tablet    Sig: Take 1 tablet (50 mg total) by mouth 2 (two) times daily.    Dispense:  180 tablet    Refill:  1  . metFORMIN (GLUCOPHAGE-XR) 750 MG 24 hr tablet    Sig: Take 1 tablet (750 mg total) by mouth 3 (three) times daily.    Dispense:  270 tablet    Refill:  1  . sitaGLIPtin (JANUVIA) 100 MG tablet    Sig: Take 1 tablet (100 mg total) by mouth daily.    Dispense:  90 tablet    Refill:  4  . triamterene-hydrochlorothiazide (MAXZIDE-25) 37.5-25 MG tablet    Sig: Take 1 tablet by mouth daily.    Dispense:  90  tablet    Refill:  1  . atorvastatin (LIPITOR) 40 MG tablet    Sig: TAKE ONE AND ONE-HALF TABLETS DAILY    Dispense:  135 tablet    Refill:  1  . montelukast (SINGULAIR) 10 MG tablet    Sig: Take 1 tablet (10 mg total) by mouth at bedtime.    Dispense:  90 tablet    Refill:  3  . conjugated estrogens (PREMARIN) vaginal cream    Sig: Small amount topically to vaginal mucosa 3 x weekly    Dispense:  42.5 g    Refill:  1  . LORazepam (ATIVAN) 0.5 MG tablet    Sig: Take 1 tablet by mouth 2 times a day as needed for anxiety or sleep    Dispense:  40 tablet    Refill:  2  . Dulaglutide (TRULICITY) 4.82 NO/0.3BC SOPN  Sig: Inject 0.75 mg into the skin once a week.    Dispense:  12 pen    Refill:  0    I discussed the assessment and treatment plan with the patient. The patient was provided an opportunity to ask questions and all were answered. The patient agreed with the plan and demonstrated an understanding of the instructions.   The patient was advised to call back or seek an in-person evaluation if the symptoms worsen or if the condition fails to improve as anticipated.  I provided 25 minutes of non-face-to-face time during this encounter.   Penni Homans, MD

## 2019-06-21 ENCOUNTER — Ambulatory Visit: Admitting: Family Medicine

## 2019-06-23 ENCOUNTER — Other Ambulatory Visit: Payer: Self-pay

## 2019-06-23 ENCOUNTER — Ambulatory Visit (INDEPENDENT_AMBULATORY_CARE_PROVIDER_SITE_OTHER): Admitting: Family Medicine

## 2019-06-23 ENCOUNTER — Encounter: Payer: Self-pay | Admitting: Family Medicine

## 2019-06-23 VITALS — BP 117/69 | HR 69 | Temp 97.7°F | Resp 18 | Wt 302.8 lb

## 2019-06-23 DIAGNOSIS — E669 Obesity, unspecified: Secondary | ICD-10-CM

## 2019-06-23 DIAGNOSIS — I10 Essential (primary) hypertension: Secondary | ICD-10-CM

## 2019-06-23 DIAGNOSIS — F419 Anxiety disorder, unspecified: Secondary | ICD-10-CM | POA: Diagnosis not present

## 2019-06-23 DIAGNOSIS — E1169 Type 2 diabetes mellitus with other specified complication: Secondary | ICD-10-CM | POA: Diagnosis not present

## 2019-06-23 DIAGNOSIS — G629 Polyneuropathy, unspecified: Secondary | ICD-10-CM

## 2019-06-23 DIAGNOSIS — E782 Mixed hyperlipidemia: Secondary | ICD-10-CM

## 2019-06-23 DIAGNOSIS — Z79899 Other long term (current) drug therapy: Secondary | ICD-10-CM

## 2019-06-23 MED ORDER — METOPROLOL TARTRATE 50 MG PO TABS: 50.0000 mg | ORAL_TABLET | Freq: Two times a day (BID) | ORAL | 1 refills | Status: DC

## 2019-06-23 MED ORDER — SITAGLIPTIN PHOSPHATE 100 MG PO TABS: 100.0000 mg | ORAL_TABLET | Freq: Every day | ORAL | 1 refills | Status: DC

## 2019-06-23 MED ORDER — GLIPIZIDE 10 MG PO TABS: 10.0000 mg | ORAL_TABLET | Freq: Two times a day (BID) | ORAL | 4 refills | Status: DC

## 2019-06-23 MED ORDER — METFORMIN HCL ER 750 MG PO TB24: 750.0000 mg | ORAL_TABLET | Freq: Three times a day (TID) | ORAL | 1 refills | Status: DC

## 2019-06-23 MED ORDER — LISINOPRIL 20 MG PO TABS: 20.0000 mg | ORAL_TABLET | Freq: Two times a day (BID) | ORAL | 1 refills | Status: DC

## 2019-06-23 MED ORDER — GABAPENTIN 300 MG PO CAPS: ORAL_CAPSULE | ORAL | 1 refills | Status: DC

## 2019-06-23 MED ORDER — TRULICITY 0.75 MG/0.5ML ~~LOC~~ SOAJ: 0.7500 mg | SUBCUTANEOUS | 1 refills | Status: DC

## 2019-06-23 MED ORDER — TRIAMTERENE-HCTZ 37.5-25 MG PO TABS: 1.0000 | ORAL_TABLET | Freq: Every day | ORAL | 1 refills | Status: DC

## 2019-06-23 MED ORDER — LORAZEPAM 0.5 MG PO TABS: ORAL_TABLET | ORAL | 2 refills | Status: DC

## 2019-06-23 MED ORDER — MONTELUKAST SODIUM 10 MG PO TABS: 10.0000 mg | ORAL_TABLET | Freq: Every day | ORAL | 1 refills | Status: DC

## 2019-06-23 MED ORDER — ATORVASTATIN CALCIUM 40 MG PO TABS: ORAL_TABLET | ORAL | 1 refills | Status: DC

## 2019-06-23 NOTE — Assessment & Plan Note (Signed)
Good response to Gabapentin med refilled today

## 2019-06-23 NOTE — Assessment & Plan Note (Addendum)
hgba1c rechecked today. Sugars much metter since adding Trulicity, 30 day average around 130, minimize simple carbs. Increase exercise as tolerated. Continue current meds

## 2019-06-23 NOTE — Progress Notes (Signed)
Virtual Visit via Video Note  I connected with April Warren on 06/23/19 at  8:40 AM EDT by a video enabled telemedicine application and verified that I am speaking with the correct person using two identifiers. [name] Eulas Post CMA was able to get patient set up on video visit  Location: Patient: home Provider: office   I discussed the limitations of evaluation and management by telemedicine and the availability of in person appointments. The patient expressed understanding and agreed to proceed.    Subjective:    Patient ID: April Warren, female    DOB: 1971-06-02, 48 y.o.   MRN: [id number]  No chief complaint on file.   HPI Patient is in today for follow up on chronic medical concerns including hypertension, hyperlipidemia, diabestes, and obesity. She feels weel. No recent febrile illness or hospitalizations. Her sugars have been greatly improved since starting Trulicity. She has als lost some weight. She is very busy taking care of her young son and 2 new foster children both roughtly 37 months old. Denies CP/palp/SOB/HA/congestion/fevers/GI or GU c/o. Taking meds as prescribed  Past Medical History:  Diagnosis Date  . Allergy   . Chicken pox 48 yrs old  . Diabetes mellitus 08-12-12   type 2- dr Zigmund Daniel diagnosed  . Diabetic retinopathy ([place])   . Dysmenorrhea 08/20/2012  . HTN (hypertension) 08/20/2012  . Hyperlipidemia, mixed 08/28/2013  . Neuropathy 02/16/2017  . Obesity   . Obesity, unspecified 08/28/2013  . Other and unspecified hyperlipidemia 08/28/2013  . Preventative health care 08/20/2012  . Sinusitis 11/14/2013  . Sinusitis, acute 05/02/2016    Past Surgical History:  Procedure Laterality Date  . REFRACTIVE SURGERY  08-12-12   right, left on 09/16/12    Family History  Problem Relation Age of Onset  . Cancer Mother 20       breast  . Diabetes Mother        type 2  . Hypertension Mother   . Hypertension Father   . Hyperlipidemia Father   . COPD Father   .  Hypertension Maternal Grandmother   . Hyperlipidemia Maternal Grandmother   . COPD Maternal Grandmother   . Asthma Maternal Grandmother   . Hypertension Maternal Grandfather   . Heart attack Maternal Grandfather   . Gout Brother   . Cancer Brother        lung  . Heart disease Paternal Grandfather     Social History   Socioeconomic History  . Marital status: Married    Spouse name: Not on file  . Number of children: Not on file  . Years of education: Not on file  . Highest education level: Not on file  Occupational History  . Not on file  Social Needs  . Financial resource strain: Not on file  . Food insecurity    Worry: Not on file    Inability: Not on file  . Transportation needs    Medical: Not on file    Non-medical: Not on file  Tobacco Use  . Smoking status: Never Smoker  . Smokeless tobacco: Never Used  Substance and Sexual Activity  . Alcohol use: Yes    Comment: socially  . Drug use: No  . Sexual activity: Yes    Partners: Male  Lifestyle  . Physical activity    Days per week: Not on file    Minutes per session: Not on file  . Stress: Not on file  Relationships  . Social connections    Talks on phone: Not on  file    Gets together: Not on file    Attends religious service: Not on file    Active member of club or organization: Not on file    Attends meetings of clubs or organizations: Not on file    Relationship status: Not on file  . Intimate partner violence    Fear of current or ex partner: Not on file    Emotionally abused: Not on file    Physically abused: Not on file    Forced sexual activity: Not on file  Other Topics Concern  . Not on file  Social History Narrative   Lives with husband.    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    Not exercising    Outpatient Medications Prior to Visit  Medication Sig Dispense Refill  . albuterol  (PROVENTIL HFA;VENTOLIN HFA) 108 (90 Base) MCG/ACT inhaler Inhale 2 puffs into the lungs every 6 (six) hours as needed for wheezing or shortness of breath. 1 Inhaler 5  . Blood Glucose Monitoring Suppl (FREESTYLE FREEDOM LITE) w/Device KIT Check blood sugar twice daily 1 each 0  . conjugated estrogens (PREMARIN) vaginal cream Small amount topically to vaginal mucosa 3 x weekly 42.5 g 1  . glucose blood test strip Use as instructed 100 each 12  . ibuprofen (ADVIL,MOTRIN) 200 MG tablet Take 400 mg by mouth every 8 (eight) hours as needed.    . Lancets (FREESTYLE) lancets Check blood sugar twice daily 200 each 5  . atorvastatin (LIPITOR) 40 MG tablet TAKE ONE AND ONE-HALF TABLETS DAILY 135 tablet 1  . Dulaglutide (TRULICITY) 5.85 ID/7.8EU SOPN Inject 0.75 mg into the skin once a week. 12 pen 0  . gabapentin (NEURONTIN) 300 MG capsule TAKE 1 CAPSULE TWICE A DAY AND 2 CAPSULES AT BEDTIME 360 capsule 1  . glipiZIDE (GLUCOTROL) 10 MG tablet Take 1 tablet (10 mg total) by mouth 2 (two) times daily. 180 tablet 4  . lisinopril (ZESTRIL) 20 MG tablet Take 1 tablet (20 mg total) by mouth 2 (two) times daily. 180 tablet 1  . LORazepam (ATIVAN) 0.5 MG tablet Take 1 tablet by mouth 2 times a day as needed for anxiety or sleep 40 tablet 2  . metFORMIN (GLUCOPHAGE-XR) 750 MG 24 hr tablet Take 1 tablet (750 mg total) by mouth 3 (three) times daily. 270 tablet 1  . metoprolol tartrate (LOPRESSOR) 50 MG tablet Take 1 tablet (50 mg total) by mouth 2 (two) times daily. 180 tablet 1  . montelukast (SINGULAIR) 10 MG tablet Take 1 tablet (10 mg total) by mouth at bedtime. 90 tablet 3  . sitaGLIPtin (JANUVIA) 100 MG tablet Take 1 tablet (100 mg total) by mouth daily. 90 tablet 4  . triamterene-hydrochlorothiazide (MAXZIDE-25) 37.5-25 MG tablet Take 1 tablet by mouth daily. 90 tablet 1   No facility-administered medications prior to visit.     No Known Allergies  Review of Systems  Constitutional: Positive for  malaise/fatigue. Negative for fever.  HENT: Negative for congestion.   Eyes: Negative for blurred vision.  Respiratory: Negative for shortness of breath.   Cardiovascular: Negative for chest pain, palpitations and leg swelling.  Gastrointestinal: Negative for abdominal pain, blood in stool and nausea.  Genitourinary: Negative for dysuria and frequency.  Musculoskeletal: Negative for falls.  Skin: Negative for rash.  Neurological: Negative for dizziness, loss of consciousness and headaches.  Endo/Heme/Allergies: Negative for environmental allergies.  Psychiatric/Behavioral: Negative for depression. The patient is not nervous/anxious.        Objective:    Physical Exam Constitutional:      Appearance: Normal appearance. She is obese. She is not ill-appearing.  HENT:     Head: Normocephalic and atraumatic.     Nose: Nose normal.  Cardiovascular:     Rate and Rhythm: Normal rate and regular rhythm.  Pulmonary:     Effort: Pulmonary effort is normal.  Neurological:     Mental Status: She is alert and oriented to person, place, and time.  Psychiatric:        Mood and Affect: Mood normal.        Behavior: Behavior normal.     BP 117/69 (BP Location: Left Arm, Patient Position: Sitting, Cuff Size: Normal)   Pulse 69   Temp 97.7 F (36.5 C) (Oral)   Resp 18   Wt (!) 302 lb 12.8 oz (137.3 kg)   BMI 43.45 kg/m  Wt Readings from Last 3 Encounters:  06/23/19 (!) 302 lb 12.8 oz (137.3 kg)  04/18/19 (!) 315 lb (142.9 kg)  01/06/19 (!) 312 lb (141.5 kg)    Diabetic Foot Exam - Simple   No data filed     Lab Results  Component Value Date   WBC 8.4 01/06/2019   HGB 12.1 01/06/2019   HCT 36.6 01/06/2019   PLT 287.0 01/06/2019   GLUCOSE 145 (H) 01/06/2019   CHOL 89 01/06/2019   TRIG 276.0 (H) 01/06/2019   HDL 20.90 (L) 01/06/2019   LDLDIRECT 38.0 01/06/2019   LDLCALC 13 10/13/2017   ALT 14 01/06/2019   AST 13 01/06/2019   NA 137 01/06/2019   K 4.9 01/06/2019   CL 102  01/06/2019   CREATININE 0.86 01/06/2019   BUN 19 01/06/2019   CO2 27 01/06/2019   TSH 2.07 01/06/2019   INR 1.03 03/04/2011   HGBA1C 8.6 (H) 01/06/2019   MICROALBUR 19.6 (H) 01/29/2016    Lab Results  Component Value Date   TSH 2.07 01/06/2019   Lab Results  Component Value Date   WBC 8.4 01/06/2019   HGB 12.1 01/06/2019   HCT 36.6 01/06/2019   MCV 90.1 01/06/2019   PLT 287.0 01/06/2019   Lab Results  Component Value Date   NA 137 01/06/2019   K 4.9 01/06/2019   CO2 27 01/06/2019   GLUCOSE 145 (H) 01/06/2019   BUN 19 01/06/2019   CREATININE 0.86 01/06/2019   BILITOT 0.5 01/06/2019   ALKPHOS 109 01/06/2019   AST 13 01/06/2019   ALT 14 01/06/2019   PROT 6.4 01/06/2019   ALBUMIN 3.6 01/06/2019   CALCIUM 8.5 01/06/2019   GFR 74.71 01/06/2019   Lab Results  Component Value Date   CHOL 89 01/06/2019   Lab Results  Component Value Date   HDL 20.90 (L) 01/06/2019   Lab Results  Component Value Date   LDLCALC 13 10/13/2017   Lab Results  Component Value Date   TRIG 276.0 (H) 01/06/2019   Lab Results  Component Value Date   CHOLHDL 4 01/06/2019   Lab Results  Component Value Date   HGBA1C 8.6 (H) 01/06/2019       Assessment & Plan:   Problem List Items Addressed This Visit    Diabetes mellitus type 2 in obese ([place])    hgba1c rechecked today. Sugars much metter since adding Trulicity, 30 day average around 130, minimize simple carbs. Increase exercise as tolerated. Continue current meds  Relevant Medications   lisinopril (ZESTRIL) 20 MG tablet   metFORMIN (GLUCOPHAGE-XR) 750 MG 24 hr tablet   sitaGLIPtin (JANUVIA) 100 MG tablet   atorvastatin (LIPITOR) 40 MG tablet   Dulaglutide (TRULICITY) 8.25 OI/3.7CW SOPN   glipiZIDE (GLUCOTROL) 10 MG tablet   Obesity    Good weight loss since adding Trulicitiy down from 888 to 302      Relevant Medications   metFORMIN (GLUCOPHAGE-XR) 750 MG 24 hr tablet   sitaGLIPtin (JANUVIA) 100 MG tablet    Dulaglutide (TRULICITY) 9.16 XI/5.0TU SOPN   glipiZIDE (GLUCOTROL) 10 MG tablet   HTN (hypertension)    Well controlled, no changes to meds. Encouraged heart healthy diet such as the DASH diet and exercise as tolerated.       Relevant Medications   lisinopril (ZESTRIL) 20 MG tablet   metoprolol tartrate (LOPRESSOR) 50 MG tablet   triamterene-hydrochlorothiazide (MAXZIDE-25) 37.5-25 MG tablet   atorvastatin (LIPITOR) 40 MG tablet   Hyperlipidemia, mixed    Tolerating statin, encouraged heart healthy diet, avoid trans fats, minimize simple carbs and saturated fats. Increase exercise as tolerated      Relevant Medications   lisinopril (ZESTRIL) 20 MG tablet   metoprolol tartrate (LOPRESSOR) 50 MG tablet   triamterene-hydrochlorothiazide (MAXZIDE-25) 37.5-25 MG tablet   atorvastatin (LIPITOR) 40 MG tablet   Neuropathy    Good response to Gabapentin med refilled today      Anxiety    Refilled Lorazepam to use prn, using infrequently      Relevant Medications   LORazepam (ATIVAN) 0.5 MG tablet      I am having Jema Dorminey "Loleta" maintain her ibuprofen, FreeStyle [place] Lite, freestyle, glucose blood, albuterol, conjugated estrogens, lisinopril, gabapentin, metoprolol tartrate, metFORMIN, sitaGLIPtin, triamterene-hydrochlorothiazide, atorvastatin, Trulicity, glipiZIDE, montelukast, and LORazepam.  Meds ordered this encounter  Medications  . lisinopril (ZESTRIL) 20 MG tablet    Sig: Take 1 tablet (20 mg total) by mouth 2 (two) times daily.    Dispense:  180 tablet    Refill:  1  . gabapentin (NEURONTIN) 300 MG capsule    Sig: TAKE 1 CAPSULE TWICE A DAY AND 2 CAPSULES AT BEDTIME    Dispense:  360 capsule    Refill:  1  . metoprolol tartrate (LOPRESSOR) 50 MG tablet    Sig: Take 1 tablet (50 mg total) by mouth 2 (two) times daily.    Dispense:  180 tablet    Refill:  1  . metFORMIN (GLUCOPHAGE-XR) 750 MG 24 hr tablet    Sig: Take 1 tablet (750 mg total) by mouth 3 (three)  times daily.    Dispense:  270 tablet    Refill:  1  . sitaGLIPtin (JANUVIA) 100 MG tablet    Sig: Take 1 tablet (100 mg total) by mouth daily.    Dispense:  90 tablet    Refill:  1  . triamterene-hydrochlorothiazide (MAXZIDE-25) 37.5-25 MG tablet    Sig: Take 1 tablet by mouth daily.    Dispense:  90 tablet    Refill:  1  . atorvastatin (LIPITOR) 40 MG tablet    Sig: TAKE ONE AND ONE-HALF TABLETS DAILY    Dispense:  135 tablet    Refill:  1  . Dulaglutide (TRULICITY) 8.82 CM/0.3KJ SOPN    Sig: Inject 0.75 mg into the skin once a week.    Dispense:  12 pen    Refill:  1  . glipiZIDE (GLUCOTROL) 10 MG tablet    Sig: Take 1 tablet (10  mg total) by mouth 2 (two) times daily.    Dispense:  180 tablet    Refill:  4  . montelukast (SINGULAIR) 10 MG tablet    Sig: Take 1 tablet (10 mg total) by mouth at bedtime.    Dispense:  90 tablet    Refill:  1  . LORazepam (ATIVAN) 0.5 MG tablet    Sig: Take 1 tablet by mouth 2 times a day as needed for anxiety or sleep    Dispense:  40 tablet    Refill:  2      I discussed the assessment and treatment plan with the patient. The patient was provided an opportunity to ask questions and all were answered. The patient agreed with the plan and demonstrated an understanding of the instructions.   The patient was advised to call back or seek an in-person evaluation if the symptoms worsen or if the condition fails to improve as anticipated.  I provided 25 minutes of non-face-to-face time during this encounter.   Penni Homans, MD

## 2019-06-23 NOTE — Assessment & Plan Note (Signed)
Good weight loss since adding Trulicitiy down from 371 to 302

## 2019-06-23 NOTE — Assessment & Plan Note (Signed)
Tolerating statin, encouraged heart healthy diet, avoid trans fats, minimize simple carbs and saturated fats. Increase exercise as tolerated 

## 2019-06-23 NOTE — Assessment & Plan Note (Signed)
Well controlled, no changes to meds. Encouraged heart healthy diet such as the DASH diet and exercise as tolerated.  °

## 2019-06-23 NOTE — Assessment & Plan Note (Signed)
Refilled Lorazepam to use prn, using infrequently

## 2019-07-18 ENCOUNTER — Other Ambulatory Visit (INDEPENDENT_AMBULATORY_CARE_PROVIDER_SITE_OTHER)

## 2019-07-18 ENCOUNTER — Other Ambulatory Visit: Payer: Self-pay

## 2019-07-18 DIAGNOSIS — I10 Essential (primary) hypertension: Secondary | ICD-10-CM | POA: Diagnosis not present

## 2019-07-18 DIAGNOSIS — E669 Obesity, unspecified: Secondary | ICD-10-CM | POA: Diagnosis not present

## 2019-07-18 DIAGNOSIS — Z79899 Other long term (current) drug therapy: Secondary | ICD-10-CM

## 2019-07-18 DIAGNOSIS — E1169 Type 2 diabetes mellitus with other specified complication: Secondary | ICD-10-CM | POA: Diagnosis not present

## 2019-07-18 DIAGNOSIS — E782 Mixed hyperlipidemia: Secondary | ICD-10-CM

## 2019-07-18 LAB — COMPREHENSIVE METABOLIC PANEL
ALT: 13 U/L (ref 0–35)
AST: 14 U/L (ref 0–37)
Albumin: 3.7 g/dL (ref 3.5–5.2)
Alkaline Phosphatase: 100 U/L (ref 39–117)
BUN: 13 mg/dL (ref 6–23)
CO2: 26 mEq/L (ref 19–32)
Calcium: 8.1 mg/dL — ABNORMAL LOW (ref 8.4–10.5)
Chloride: 102 mEq/L (ref 96–112)
Creatinine, Ser: 0.94 mg/dL (ref 0.40–1.20)
GFR: 63.29 mL/min (ref >60.00)
Glucose, Bld: 93 mg/dL (ref 70–99)
Potassium: 4.7 mEq/L (ref 3.5–5.1)
Sodium: 137 mEq/L (ref 135–145)
Total Bilirubin: 0.5 mg/dL (ref 0.2–1.2)
Total Protein: 5.9 g/dL — ABNORMAL LOW (ref 6.0–8.3)

## 2019-07-18 LAB — CBC
HCT: 36.6 % (ref 36.0–46.0)
Hemoglobin: 11.9 g/dL — ABNORMAL LOW (ref 12.0–15.0)
MCHC: 32.5 g/dL (ref 30.0–36.0)
MCV: 92.4 fl (ref 78.0–100.0)
Platelets: 288 10*3/uL (ref 150.0–400.0)
RBC: 3.96 Mil/uL (ref 3.87–5.11)
RDW: 13 % (ref 11.5–15.5)
WBC: 8.5 10*3/uL (ref 4.0–10.5)

## 2019-07-18 LAB — TSH: TSH: 2.19 u[IU]/mL (ref 0.35–4.50)

## 2019-07-18 LAB — LDL CHOLESTEROL, DIRECT: Direct LDL: 35 mg/dL

## 2019-07-18 LAB — LIPID PANEL
Cholesterol: 77 mg/dL (ref 0–200)
HDL: 19.1 mg/dL — ABNORMAL LOW (ref >39.00)
NonHDL: 57.62
Total CHOL/HDL Ratio: 4
Triglycerides: 213 mg/dL — ABNORMAL HIGH (ref 0.0–149.0)
VLDL: 42.6 mg/dL — ABNORMAL HIGH (ref 0.0–40.0)

## 2019-07-18 LAB — HEMOGLOBIN A1C: Hgb A1c MFr Bld: 6.6 % — ABNORMAL HIGH (ref 4.6–6.5)

## 2019-07-19 ENCOUNTER — Encounter: Payer: Self-pay | Admitting: Family Medicine

## 2019-07-19 LAB — PAIN MGMT, PROFILE 8 W/CONF, U
6 Acetylmorphine: NEGATIVE ng/mL
Alcohol Metabolites: NEGATIVE ng/mL (ref <500)
Amphetamines: NEGATIVE ng/mL
Benzodiazepines: NEGATIVE ng/mL
Buprenorphine, Urine: NEGATIVE ng/mL
Cocaine Metabolite: NEGATIVE ng/mL
Creatinine: 53.1 mg/dL
MDMA: NEGATIVE ng/mL
Marijuana Metabolite: NEGATIVE ng/mL
Opiates: NEGATIVE ng/mL
Oxidant: NEGATIVE ug/mL
Oxycodone: NEGATIVE ng/mL
pH: 5.7 (ref 4.5–9.0)

## 2019-07-19 LAB — VITAMIN D 25 HYDROXY (VIT D DEFICIENCY, FRACTURES): VITD: 18.02 ng/mL — ABNORMAL LOW (ref 30.00–100.00)

## 2019-07-26 ENCOUNTER — Encounter: Payer: Self-pay | Admitting: Family Medicine

## 2019-07-26 MED ORDER — VITAMIN D (ERGOCALCIFEROL) 1.25 MG (50000 UNIT) PO CAPS: 50000.0000 [IU] | ORAL_CAPSULE | ORAL | 0 refills | Status: DC

## 2019-07-27 ENCOUNTER — Other Ambulatory Visit: Payer: Self-pay | Admitting: Family Medicine

## 2019-07-27 MED ORDER — NITROFURANTOIN MONOHYD MACRO 100 MG PO CAPS: 100.0000 mg | ORAL_CAPSULE | Freq: Two times a day (BID) | ORAL | 0 refills | Status: DC

## 2019-07-27 NOTE — Telephone Encounter (Signed)
Dr Charlett Blake -- does pt need a Virtual Visit or just lab orders / appt? If so, can you place future lab orders?

## 2019-10-01 ENCOUNTER — Other Ambulatory Visit: Payer: Self-pay | Admitting: Family Medicine

## 2019-10-06 ENCOUNTER — Ambulatory Visit (INDEPENDENT_AMBULATORY_CARE_PROVIDER_SITE_OTHER): Admitting: Family Medicine

## 2019-10-06 ENCOUNTER — Ambulatory Visit: Admitting: Family Medicine

## 2019-10-06 ENCOUNTER — Other Ambulatory Visit: Payer: Self-pay

## 2019-10-06 DIAGNOSIS — E669 Obesity, unspecified: Secondary | ICD-10-CM

## 2019-10-06 DIAGNOSIS — E782 Mixed hyperlipidemia: Secondary | ICD-10-CM | POA: Diagnosis not present

## 2019-10-06 DIAGNOSIS — E559 Vitamin D deficiency, unspecified: Secondary | ICD-10-CM

## 2019-10-06 DIAGNOSIS — E1169 Type 2 diabetes mellitus with other specified complication: Secondary | ICD-10-CM | POA: Diagnosis not present

## 2019-10-06 DIAGNOSIS — F419 Anxiety disorder, unspecified: Secondary | ICD-10-CM

## 2019-10-06 DIAGNOSIS — I10 Essential (primary) hypertension: Secondary | ICD-10-CM

## 2019-10-06 LAB — COMPREHENSIVE METABOLIC PANEL
ALT: 13 U/L (ref 0–35)
AST: 13 U/L (ref 0–37)
Albumin: 4 g/dL (ref 3.5–5.2)
Alkaline Phosphatase: 103 U/L (ref 39–117)
BUN: 16 mg/dL (ref 6–23)
CO2: 27 mEq/L (ref 19–32)
Calcium: 9.4 mg/dL (ref 8.4–10.5)
Chloride: 102 mEq/L (ref 96–112)
Creatinine, Ser: 0.95 mg/dL (ref 0.40–1.20)
GFR: 62.47 mL/min (ref >60.00)
Glucose, Bld: 103 mg/dL — ABNORMAL HIGH (ref 70–99)
Potassium: 4.6 mEq/L (ref 3.5–5.1)
Sodium: 137 mEq/L (ref 135–145)
Total Bilirubin: 0.5 mg/dL (ref 0.2–1.2)
Total Protein: 6.8 g/dL (ref 6.0–8.3)

## 2019-10-06 LAB — CBC
HCT: 38.7 % (ref 36.0–46.0)
Hemoglobin: 12.6 g/dL (ref 12.0–15.0)
MCHC: 32.6 g/dL (ref 30.0–36.0)
MCV: 92.7 fl (ref 78.0–100.0)
Platelets: 321 10*3/uL (ref 150.0–400.0)
RBC: 4.18 Mil/uL (ref 3.87–5.11)
RDW: 13.2 % (ref 11.5–15.5)
WBC: 9.4 10*3/uL (ref 4.0–10.5)

## 2019-10-06 LAB — LIPID PANEL
Cholesterol: 99 mg/dL (ref 0–200)
HDL: 21.1 mg/dL — ABNORMAL LOW (ref >39.00)
NonHDL: 77.49
Total CHOL/HDL Ratio: 5
Triglycerides: 268 mg/dL — ABNORMAL HIGH (ref 0.0–149.0)
VLDL: 53.6 mg/dL — ABNORMAL HIGH (ref 0.0–40.0)

## 2019-10-06 LAB — HEMOGLOBIN A1C: Hgb A1c MFr Bld: 6.8 % — ABNORMAL HIGH (ref 4.6–6.5)

## 2019-10-06 LAB — VITAMIN D 25 HYDROXY (VIT D DEFICIENCY, FRACTURES): VITD: 36.88 ng/mL (ref 30.00–100.00)

## 2019-10-06 LAB — LDL CHOLESTEROL, DIRECT: Direct LDL: 50 mg/dL

## 2019-10-06 LAB — TSH: TSH: 1.97 u[IU]/mL (ref 0.35–4.50)

## 2019-10-06 MED ORDER — VITAMIN D (ERGOCALCIFEROL) 1.25 MG (50000 UNIT) PO CAPS: ORAL_CAPSULE | ORAL | 1 refills | Status: DC

## 2019-10-06 MED ORDER — SITAGLIPTIN PHOSPHATE 100 MG PO TABS: 100.0000 mg | ORAL_TABLET | Freq: Every day | ORAL | 1 refills | Status: DC

## 2019-10-06 MED ORDER — TRULICITY 0.75 MG/0.5ML ~~LOC~~ SOAJ: 0.7500 mg | SUBCUTANEOUS | 1 refills | Status: DC

## 2019-10-06 MED ORDER — GLIPIZIDE 10 MG PO TABS: 10.0000 mg | ORAL_TABLET | Freq: Two times a day (BID) | ORAL | 1 refills | Status: DC

## 2019-10-07 MED ORDER — ATORVASTATIN CALCIUM 80 MG PO TABS: 80.0000 mg | ORAL_TABLET | Freq: Every day | ORAL | 3 refills | Status: DC

## 2019-10-08 ENCOUNTER — Encounter: Payer: Self-pay | Admitting: Family Medicine

## 2019-10-09 NOTE — Assessment & Plan Note (Signed)
Encouraged heart healthy diet, increase exercise, avoid trans fats, consider a krill oil cap daily. Increase Atorvastatin to 80 mg qhs.

## 2019-10-09 NOTE — Assessment & Plan Note (Signed)
hgba1c acceptable, minimize simple carbs. Increase exercise as tolerated. Continue current meds 

## 2019-10-09 NOTE — Assessment & Plan Note (Signed)
Well controlled, no changes to meds. Encouraged heart healthy diet such as the DASH diet and exercise as tolerated.  °

## 2019-10-09 NOTE — Assessment & Plan Note (Signed)
Her adoptive son has been ill and needed some procedures so very stressed but overall she is managing well.

## 2019-10-09 NOTE — Progress Notes (Signed)
Virtual Visit via Video Note  I connected with April Warren on 10/06/19 at  3:20 PM EDT by a video enabled telemedicine application and verified that I am speaking with the correct person using two identifiers.  Location: Patient: home Provider: office   I discussed the limitations of evaluation and management by telemedicine and the availability of in person appointments. The patient expressed understanding and agreed to proceed. Magdalene Molly, CMA was able to set patient up with visit, video   Subjective:    Patient ID: April Warren, female    DOB: 16-Sep-1971, 48 y.o.   MRN: [id number]  Chief Complaint  Patient presents with  . Hyperlipidemia  . Hypertension  . Diabetes    HPI Patient is in today for follow up on chronic medical concerns including hyperlipidemia, hypertension, diabetes and more. She has been under increased stress due to the pandemic but also some health concerns of her 69 year old adopted son who now has a G tube in place. The patient herself denies any recent febrile illness or hospitalizations. Denies CP/palp/SOB/HA/congestion/fevers/GI or GU c/o. Taking meds as prescribed  Past Medical History:  Diagnosis Date  . Allergy   . Chicken pox 48 yrs old  . Diabetes mellitus 08-12-12   type 2- dr Zigmund Daniel diagnosed  . Diabetic retinopathy ([place])   . Dysmenorrhea 08/20/2012  . HTN (hypertension) 08/20/2012  . Hyperlipidemia, mixed 08/28/2013  . Neuropathy 02/16/2017  . Obesity   . Obesity, unspecified 08/28/2013  . Other and unspecified hyperlipidemia 08/28/2013  . Preventative health care 08/20/2012  . Sinusitis 11/14/2013  . Sinusitis, acute 05/02/2016    Past Surgical History:  Procedure Laterality Date  . REFRACTIVE SURGERY  08-12-12   right, left on 09/16/12    Family History  Problem Relation Age of Onset  . Cancer Mother 69       breast  . Diabetes Mother        type 2  . Hypertension Mother   . Hypertension Father   . Hyperlipidemia Father   . COPD  Father   . Hypertension Maternal Grandmother   . Hyperlipidemia Maternal Grandmother   . COPD Maternal Grandmother   . Asthma Maternal Grandmother   . Hypertension Maternal Grandfather   . Heart attack Maternal Grandfather   . Gout Brother   . Cancer Brother        lung  . Heart disease Paternal Grandfather     Social History   Socioeconomic History  . Marital status: Married    Spouse name: Not on file  . Number of children: Not on file  . Years of education: Not on file  . Highest education level: Not on file  Occupational History  . Not on file  Social Needs  . Financial resource strain: Not on file  . Food insecurity    Worry: Not on file    Inability: Not on file  . Transportation needs    Medical: Not on file    Non-medical: Not on file  Tobacco Use  . Smoking status: Never Smoker  . Smokeless tobacco: Never Used  Substance and Sexual Activity  . Alcohol use: Yes    Comment: socially  . Drug use: No  . Sexual activity: Yes    Partners: Male  Lifestyle  . Physical activity    Days per week: Not on file    Minutes per session: Not on file  . Stress: Not on file  Relationships  . Social connections  Talks on phone: Not on file    Gets together: Not on file    Attends religious service: Not on file    Active member of club or organization: Not on file    Attends meetings of clubs or organizations: Not on file    Relationship status: Not on file  . Intimate partner violence    Fear of current or ex partner: Not on file    Emotionally abused: Not on file    Physically abused: Not on file    Forced sexual activity: Not on file  Other Topics Concern  . Not on file  Social History Narrative   Lives with husband.    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    Not exercising    Outpatient Medications Prior to Visit  Medication Sig Dispense Refill  .  albuterol (PROVENTIL HFA;VENTOLIN HFA) 108 (90 Base) MCG/ACT inhaler Inhale 2 puffs into the lungs every 6 (six) hours as needed for wheezing or shortness of breath. 1 Inhaler 5  . Blood Glucose Monitoring Suppl (FREESTYLE FREEDOM LITE) w/Device KIT Check blood sugar twice daily 1 each 0  . conjugated estrogens (PREMARIN) vaginal cream Small amount topically to vaginal mucosa 3 x weekly 42.5 g 1  . gabapentin (NEURONTIN) 300 MG capsule TAKE 1 CAPSULE TWICE A DAY AND 2 CAPSULES AT BEDTIME 360 capsule 1  . glucose blood test strip Use as instructed 100 each 12  . ibuprofen (ADVIL,MOTRIN) 200 MG tablet Take 400 mg by mouth every 8 (eight) hours as needed.    . Lancets (FREESTYLE) lancets Check blood sugar twice daily 200 each 5  . lisinopril (ZESTRIL) 20 MG tablet Take 1 tablet (20 mg total) by mouth 2 (two) times daily. 180 tablet 1  . LORazepam (ATIVAN) 0.5 MG tablet Take 1 tablet by mouth 2 times a day as needed for anxiety or sleep 40 tablet 2  . metFORMIN (GLUCOPHAGE-XR) 750 MG 24 hr tablet Take 1 tablet (750 mg total) by mouth 3 (three) times daily. 270 tablet 1  . metoprolol tartrate (LOPRESSOR) 50 MG tablet Take 1 tablet (50 mg total) by mouth 2 (two) times daily. 180 tablet 1  . montelukast (SINGULAIR) 10 MG tablet Take 1 tablet (10 mg total) by mouth at bedtime. 90 tablet 1  . triamterene-hydrochlorothiazide (MAXZIDE-25) 37.5-25 MG tablet Take 1 tablet by mouth daily. 90 tablet 1  . atorvastatin (LIPITOR) 40 MG tablet TAKE ONE AND ONE-HALF TABLETS DAILY 135 tablet 1  . Dulaglutide (TRULICITY) 9.32 IZ/1.2WP SOPN Inject 0.75 mg into the skin once a week. 12 pen 1  . glipiZIDE (GLUCOTROL) 10 MG tablet Take 1 tablet (10 mg total) by mouth 2 (two) times daily. 180 tablet 4  . sitaGLIPtin (JANUVIA) 100 MG tablet Take 1 tablet (100 mg total) by mouth daily. 90 tablet 1  . Vitamin D, Ergocalciferol, (DRISDOL) 1.25 MG (50000 UT) CAPS capsule TAKE 1 CAPSULE EVERY 7 DAYS 16 capsule 3  . nitrofurantoin,  macrocrystal-monohydrate, (MACROBID) 100 MG capsule Take 1 capsule (100 mg total) by mouth 2 (two) times daily. 14 capsule 0   No facility-administered medications prior to visit.     No Known Allergies  Review of Systems  Constitutional: Positive for malaise/fatigue. Negative for fever.  HENT: Negative for congestion.   Eyes: Negative for blurred vision.  Respiratory: Negative for shortness of breath.   Cardiovascular: Negative for chest  pain, palpitations and leg swelling.  Gastrointestinal: Negative for abdominal pain, blood in stool and nausea.  Genitourinary: Negative for dysuria and frequency.  Musculoskeletal: Negative for falls.  Skin: Negative for rash.  Neurological: Negative for dizziness, loss of consciousness and headaches.  Endo/Heme/Allergies: Negative for environmental allergies.  Psychiatric/Behavioral: Negative for depression. The patient is nervous/anxious.        Objective:    Physical Exam Constitutional:      Appearance: Normal appearance. She is obese. She is not ill-appearing.  HENT:     Head: Normocephalic and atraumatic.  Pulmonary:     Effort: Pulmonary effort is normal.  Neurological:     Mental Status: She is oriented to person, place, and time.  Psychiatric:        Mood and Affect: Mood normal.        Behavior: Behavior normal.     There were no vitals taken for this visit. Wt Readings from Last 3 Encounters:  06/23/19 (!) 302 lb 12.8 oz (137.3 kg)  04/18/19 (!) 315 lb (142.9 kg)  01/06/19 (!) 312 lb (141.5 kg)    Diabetic Foot Exam - Simple   No data filed     Lab Results  Component Value Date   WBC 9.4 10/06/2019   HGB 12.6 10/06/2019   HCT 38.7 10/06/2019   PLT 321.0 10/06/2019   GLUCOSE 103 (H) 10/06/2019   CHOL 99 10/06/2019   TRIG 268.0 (H) 10/06/2019   HDL 21.10 (L) 10/06/2019   LDLDIRECT 50.0 10/06/2019   LDLCALC 13 10/13/2017   ALT 13 10/06/2019   AST 13 10/06/2019   NA 137 10/06/2019   K 4.6 10/06/2019   CL 102  10/06/2019   CREATININE 0.95 10/06/2019   BUN 16 10/06/2019   CO2 27 10/06/2019   TSH 1.97 10/06/2019   INR 1.03 03/04/2011   HGBA1C 6.8 (H) 10/06/2019   MICROALBUR 19.6 (H) 01/29/2016    Lab Results  Component Value Date   TSH 1.97 10/06/2019   Lab Results  Component Value Date   WBC 9.4 10/06/2019   HGB 12.6 10/06/2019   HCT 38.7 10/06/2019   MCV 92.7 10/06/2019   PLT 321.0 10/06/2019   Lab Results  Component Value Date   NA 137 10/06/2019   K 4.6 10/06/2019   CO2 27 10/06/2019   GLUCOSE 103 (H) 10/06/2019   BUN 16 10/06/2019   CREATININE 0.95 10/06/2019   BILITOT 0.5 10/06/2019   ALKPHOS 103 10/06/2019   AST 13 10/06/2019   ALT 13 10/06/2019   PROT 6.8 10/06/2019   ALBUMIN 4.0 10/06/2019   CALCIUM 9.4 10/06/2019   GFR 62.47 10/06/2019   Lab Results  Component Value Date   CHOL 99 10/06/2019   Lab Results  Component Value Date   HDL 21.10 (L) 10/06/2019   Lab Results  Component Value Date   LDLCALC 13 10/13/2017   Lab Results  Component Value Date   TRIG 268.0 (H) 10/06/2019   Lab Results  Component Value Date   CHOLHDL 5 10/06/2019   Lab Results  Component Value Date   HGBA1C 6.8 (H) 10/06/2019       Assessment & Plan:   Problem List Items Addressed This Visit    Diabetes mellitus type 2 in obese ([place])    hgba1c acceptable, minimize simple carbs. Increase exercise as tolerated. Continue current meds      Relevant Medications   glipiZIDE (GLUCOTROL) 10 MG tablet   Dulaglutide (TRULICITY) 1.27 NT/7.0YF SOPN  sitaGLIPtin (JANUVIA) 100 MG tablet   atorvastatin (LIPITOR) 80 MG tablet   Other Relevant Orders   Hemoglobin A1c (Completed)   Obesity   Relevant Medications   glipiZIDE (GLUCOTROL) 10 MG tablet   Dulaglutide (TRULICITY) 1.61 WR/6.0AV SOPN   sitaGLIPtin (JANUVIA) 100 MG tablet   Other Relevant Orders   Hemoglobin A1c (Completed)   HTN (hypertension) - Primary    Well controlled, no changes to meds. Encouraged heart healthy  diet such as the DASH diet and exercise as tolerated.       Relevant Medications   atorvastatin (LIPITOR) 80 MG tablet   Other Relevant Orders   CBC (Completed)   Comprehensive metabolic panel (Completed)   TSH (Completed)   Hyperlipidemia, mixed    Encouraged heart healthy diet, increase exercise, avoid trans fats, consider a krill oil cap daily. Increase Atorvastatin to 80 mg qhs.       Relevant Medications   atorvastatin (LIPITOR) 80 MG tablet   Other Relevant Orders   Lipid panel (Completed)   Anxiety    Her adoptive son has been ill and needed some procedures so very stressed but overall she is managing well.       Other Visit Diagnoses    Vitamin D deficiency       Relevant Orders   VITAMIN D 25 Hydroxy (Vit-D Deficiency, Fractures) (Completed)      I have discontinued April Warren "L[place]ta"'s atorvastatin and nitrofurantoin (macrocrystal-monohydrate). I am also having her start on atorvastatin. Additionally, I am having her maintain her ibuprofen, FreeStyle Freedom Lite, freestyle, glucose blood, albuterol, conjugated estrogens, lisinopril, gabapentin, metoprolol tartrate, metFORMIN, triamterene-hydrochlorothiazide, montelukast, LORazepam, glipiZIDE, Vitamin D (Ergocalciferol), Trulicity, and sitaGLIPtin.  Meds ordered this encounter  Medications  . glipiZIDE (GLUCOTROL) 10 MG tablet    Sig: Take 1 tablet (10 mg total) by mouth 2 (two) times daily.    Dispense:  180 tablet    Refill:  1  . Vitamin D, Ergocalciferol, (DRISDOL) 1.25 MG (50000 UT) CAPS capsule    Sig: TAKE 1 CAPSULE EVERY 7 DAYS    Dispense:  16 capsule    Refill:  1  . Dulaglutide (TRULICITY) 4.09 WJ/1.9JY SOPN    Sig: Inject 0.75 mg into the skin once a week.    Dispense:  12 pen    Refill:  1  . sitaGLIPtin (JANUVIA) 100 MG tablet    Sig: Take 1 tablet (100 mg total) by mouth daily.    Dispense:  90 tablet    Refill:  1  . atorvastatin (LIPITOR) 80 MG tablet    Sig: Take 1 tablet (80 mg total)  by mouth daily.    Dispense:  90 tablet    Refill:  3    D/c previous script    I discussed the assessment and treatment plan with the patient. The patient was provided an opportunity to ask questions and all were answered. The patient agreed with the plan and demonstrated an understanding of the instructions.   The patient was advised to call back or seek an in-person evaluation if the symptoms worsen or if the condition fails to improve as anticipated.  I provided 25 minutes of non-face-to-face time during this encounter.   Penni Homans, MD

## 2019-10-14 ENCOUNTER — Ambulatory Visit

## 2019-12-12 ENCOUNTER — Other Ambulatory Visit: Payer: Self-pay | Admitting: Family Medicine

## 2019-12-12 DIAGNOSIS — F419 Anxiety disorder, unspecified: Secondary | ICD-10-CM

## 2019-12-12 NOTE — Telephone Encounter (Signed)
Requesting:ativan  Contract:yes UDS:low risk  Last OV:10/06/19 Next OV:01/13/20 Last Refill:06/23/19  #[id number] Database:   Please advise

## 2020-01-09 ENCOUNTER — Telehealth: Payer: Self-pay | Admitting: Family Medicine

## 2020-01-09 NOTE — Telephone Encounter (Signed)
Pt stated her son tested positive for Covid 19 today. Pt is having nose drainage, headache and muscle aches. She wants to know if Dr. Abner Greenspan thinks she should go ahead and get tested and if there is anything she needs to be doing/taking.

## 2020-01-09 NOTE — Telephone Encounter (Signed)
Yes she should be tested and then put her on for a virtual visit to talk through management if she is willing, if not let me know

## 2020-01-09 NOTE — Telephone Encounter (Signed)
Please advise 

## 2020-01-11 ENCOUNTER — Other Ambulatory Visit: Payer: Self-pay | Admitting: Family Medicine

## 2020-01-11 DIAGNOSIS — I10 Essential (primary) hypertension: Secondary | ICD-10-CM

## 2020-01-11 DIAGNOSIS — E1169 Type 2 diabetes mellitus with other specified complication: Secondary | ICD-10-CM

## 2020-01-11 DIAGNOSIS — E669 Obesity, unspecified: Secondary | ICD-10-CM

## 2020-01-11 NOTE — Telephone Encounter (Signed)
Pt was called and message was left on VM asking if she needed a sooner appt with another MD as a VV

## 2020-01-13 ENCOUNTER — Encounter: Payer: Self-pay | Admitting: Family Medicine

## 2020-01-13 ENCOUNTER — Other Ambulatory Visit: Payer: Self-pay

## 2020-01-13 ENCOUNTER — Ambulatory Visit (INDEPENDENT_AMBULATORY_CARE_PROVIDER_SITE_OTHER): Admitting: Family Medicine

## 2020-01-13 DIAGNOSIS — E782 Mixed hyperlipidemia: Secondary | ICD-10-CM | POA: Diagnosis not present

## 2020-01-13 DIAGNOSIS — I10 Essential (primary) hypertension: Secondary | ICD-10-CM

## 2020-01-13 DIAGNOSIS — E669 Obesity, unspecified: Secondary | ICD-10-CM

## 2020-01-13 DIAGNOSIS — Z20822 Contact with and (suspected) exposure to covid-19: Secondary | ICD-10-CM

## 2020-01-13 DIAGNOSIS — E1169 Type 2 diabetes mellitus with other specified complication: Secondary | ICD-10-CM

## 2020-01-13 DIAGNOSIS — F419 Anxiety disorder, unspecified: Secondary | ICD-10-CM

## 2020-01-13 MED ORDER — SITAGLIPTIN PHOSPHATE 100 MG PO TABS: 100.0000 mg | ORAL_TABLET | Freq: Every day | ORAL | 1 refills | Status: DC

## 2020-01-13 MED ORDER — GABAPENTIN 300 MG PO CAPS: ORAL_CAPSULE | ORAL | 1 refills | Status: DC

## 2020-01-13 MED ORDER — PREMARIN 0.625 MG/GM VA CREA: TOPICAL_CREAM | VAGINAL | 1 refills | Status: DC

## 2020-01-13 MED ORDER — MONTELUKAST SODIUM 10 MG PO TABS: 10.0000 mg | ORAL_TABLET | Freq: Every day | ORAL | 1 refills | Status: DC

## 2020-01-13 MED ORDER — BENZONATATE 100 MG PO CAPS: 100.0000 mg | ORAL_CAPSULE | Freq: Two times a day (BID) | ORAL | 0 refills | Status: DC | PRN

## 2020-01-13 MED ORDER — LORAZEPAM 0.5 MG PO TABS: ORAL_TABLET | ORAL | 3 refills | Status: DC

## 2020-01-13 MED ORDER — VITAMIN D (ERGOCALCIFEROL) 1.25 MG (50000 UNIT) PO CAPS: ORAL_CAPSULE | ORAL | 1 refills | Status: DC

## 2020-01-13 MED ORDER — GLIPIZIDE 10 MG PO TABS: 10.0000 mg | ORAL_TABLET | Freq: Two times a day (BID) | ORAL | 1 refills | Status: DC

## 2020-01-13 MED ORDER — ATORVASTATIN CALCIUM 80 MG PO TABS: 80.0000 mg | ORAL_TABLET | Freq: Every day | ORAL | 3 refills | Status: DC

## 2020-01-13 NOTE — Progress Notes (Signed)
49 year old diagnosed with COVID, she now has a lot of congestion, breathing is fine no other symptoms.

## 2020-01-13 NOTE — Patient Instructions (Signed)
Multivitamin with minerals daily with Selenium Vitamin D 1000 IU daily Probiotic daily, with Lactobacillus and Bifidophilus Aspirin 81 mg daily  Melatonin 2.5 to 5 mg daily at bedtime  Omron blood pressure cuff upper arm Pulse oximeter Check vitals weekly and as needed

## 2020-01-13 NOTE — Assessment & Plan Note (Signed)
Well controlled, no changes to meds. Encouraged heart healthy diet such as the DASH diet and exercise as tolerated.  °

## 2020-01-15 DIAGNOSIS — Z20822 Contact with and (suspected) exposure to covid-19: Secondary | ICD-10-CM | POA: Insufficient documentation

## 2020-01-15 NOTE — Assessment & Plan Note (Signed)
Tolerating statin, encouraged heart healthy diet, avoid trans fats, minimize simple carbs and saturated fats. Increase exercise as tolerated 

## 2020-01-15 NOTE — Assessment & Plan Note (Signed)
Her 50 year old son has tested positive and is doing well but now she, her husband and an 32 month olf foster daughter have symptoms. She has been tested but does not know results yet. She may use Tessalon perles prn, mucinex bid, Vit C [phone] bid, zinc 30 mg qd, ASA 81 mg bid, Pepcid 20 mg bid, Melatonin 5-10 mg qhs, MVI qd, Encouraged increased rest and hydration, add probiotics, zinc such as Coldeze or Xicam. Treat fevers as needed

## 2020-01-15 NOTE — Assessment & Plan Note (Signed)
hgba1c acceptable, minimize simple carbs. Increase exercise as tolerated. Continue current meds 

## 2020-01-15 NOTE — Progress Notes (Signed)
Patient ID: April Warren, female   DOB: 1971/11/28, 49 y.o.   MRN: [id number] Virtual Visit via Video Note  I connected with Carlus Pavlov on 01/13/20 at  8:40 AM EST by a video enabled telemedicine application and verified that I am speaking with the correct person using two identifiers.  Location: Patient: home Provider: home   I discussed the limitations of evaluation and management by telemedicine and the availability of in person appointments. The patient expressed understanding and agreed to proceed. Magdalene Molly, CMA was able to get the patient set up on a visit,video   Subjective:    Patient ID: April Warren, female    DOB: [birth year], 49 y.o.   MRN: [id number]  Chief Complaint  Patient presents with  . Follow-up    HPI Patient is in today for evaluation of symptoms after her 49 year old was sick with fever and malaise and tested positive for COVID. Patient herself notes cough and congestion maybe some fatigue and myalgias but no fevers,chills, loss of taste or smell. Denies CP/palp/SOB/HA/fevers/GI or GU c/o. Taking meds as prescribe. No polyuria or polydipsia. They were tested at the [facility] by the [place] test results.  Past Medical History:  Diagnosis Date  . Allergy   . Chicken pox 49 yrs old  . Diabetes mellitus 08-12-12   type 2- dr Zigmund Daniel diagnosed  . Diabetic retinopathy ([place])   . Dysmenorrhea 08/20/2012  . HTN (hypertension) 08/20/2012  . Hyperlipidemia, mixed 08/28/2013  . Neuropathy 02/16/2017  . Obesity   . Obesity, unspecified 08/28/2013  . Other and unspecified hyperlipidemia 08/28/2013  . Preventative health care 08/20/2012  . Sinusitis 11/14/2013  . Sinusitis, acute 05/02/2016    Past Surgical History:  Procedure Laterality Date  . REFRACTIVE SURGERY  08-12-12   right, left on 09/16/12    Family History  Problem Relation Age of Onset  . Cancer Mother 32       breast  . Diabetes Mother        type 2  . Hypertension  Mother   . Hypertension Father   . Hyperlipidemia Father   . COPD Father   . Hypertension Maternal Grandmother   . Hyperlipidemia Maternal Grandmother   . COPD Maternal Grandmother   . Asthma Maternal Grandmother   . Hypertension Maternal Grandfather   . Heart attack Maternal Grandfather   . Gout Brother   . Cancer Brother        lung  . Heart disease Paternal Grandfather     Social History   Socioeconomic History  . Marital status: Married    Spouse name: Not on file  . Number of children: Not on file  . Years of education: Not on file  . Highest education level: Not on file  Occupational History  . Not on file  Tobacco Use  . Smoking status: Never Smoker  . Smokeless tobacco: Never Used  Substance and Sexual Activity  . Alcohol use: Yes    Comment: socially  . Drug use: No  . Sexual activity: Yes    Partners: Male  Other Topics Concern  . Not on file  Social History Narrative   Lives with husband.    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    Not exercising   Social Determinants of Health   Financial Resource Strain:   . Difficulty of Paying Living Expenses: Not  on file  Food Insecurity:   . Worried About Charity fundraiser in the Last Year: Not on file  . Ran Out of Food in the Last Year: Not on file  Transportation Needs:   . Lack of Transportation (Medical): Not on file  . Lack of Transportation (Non-Medical): Not on file  Physical Activity:   . Days of Exercise per Week: Not on file  . Minutes of Exercise per Session: Not on file  Stress:   . Feeling of Stress : Not on file  Social Connections:   . Frequency of Communication with Friends and Family: Not on file  . Frequency of Social Gatherings with Friends and Family: Not on file  . Attends Religious Services: Not on file  . Active Member of Clubs or Organizations: Not on file  . Attends Theatre manager Meetings: Not on file  . Marital Status: Not on file  Intimate Partner Violence:   . Fear of Current or Ex-Partner: Not on file  . Emotionally Abused: Not on file  . Physically Abused: Not on file  . Sexually Abused: Not on file    Outpatient Medications Prior to Visit  Medication Sig Dispense Refill  . albuterol (PROVENTIL HFA;VENTOLIN HFA) 108 (90 Base) MCG/ACT inhaler Inhale 2 puffs into the lungs every 6 (six) hours as needed for wheezing or shortness of breath. 1 Inhaler 5  . Blood Glucose Monitoring Suppl (FREESTYLE FREEDOM LITE) w/Device KIT Check blood sugar twice daily 1 each 0  . Dulaglutide (TRULICITY) 6.70 LI/1.0VU SOPN Inject 0.75 mg into the skin once a week. 12 pen 1  . glucose blood test strip Use as instructed 100 each 12  . ibuprofen (ADVIL,MOTRIN) 200 MG tablet Take 400 mg by mouth every 8 (eight) hours as needed.    . Lancets (FREESTYLE) lancets Check blood sugar twice daily 200 each 5  . lisinopril (ZESTRIL) 20 MG tablet TAKE 1 TABLET TWICE A DAY 180 tablet 3  . metFORMIN (GLUCOPHAGE-XR) 750 MG 24 hr tablet TAKE 1 TABLET THREE TIMES A DAY 270 tablet 3  . metoprolol tartrate (LOPRESSOR) 50 MG tablet TAKE 1 TABLET TWICE A DAY 180 tablet 3  . triamterene-hydrochlorothiazide (MAXZIDE-25) 37.5-25 MG tablet TAKE 1 TABLET DAILY 90 tablet 3  . atorvastatin (LIPITOR) 80 MG tablet Take 1 tablet (80 mg total) by mouth daily. 90 tablet 3  . conjugated estrogens (PREMARIN) vaginal cream Small amount topically to vaginal mucosa 3 x weekly 42.5 g 1  . gabapentin (NEURONTIN) 300 MG capsule TAKE 1 CAPSULE TWICE A DAY AND 2 CAPSULES AT BEDTIME 360 capsule 1  . glipiZIDE (GLUCOTROL) 10 MG tablet Take 1 tablet (10 mg total) by mouth 2 (two) times daily. 180 tablet 1  . LORazepam (ATIVAN) 0.5 MG tablet TAKE 1 TABLET TWICE DAILY AS NEEDED FOR ANXIETY OR SLEEP 40 tablet 0  . montelukast (SINGULAIR) 10 MG tablet Take 1 tablet (10 mg total) by mouth at bedtime. 90 tablet 1  .  sitaGLIPtin (JANUVIA) 100 MG tablet Take 1 tablet (100 mg total) by mouth daily. 90 tablet 1  . Vitamin D, Ergocalciferol, (DRISDOL) 1.25 MG (50000 UT) CAPS capsule TAKE 1 CAPSULE EVERY 7 DAYS 16 capsule 1   No facility-administered medications prior to visit.    No Known Allergies  Review of Systems  Constitutional: Positive for malaise/fatigue. Negative for chills and fever.  HENT: Positive for congestion.   Eyes: Negative for blurred vision.  Respiratory: Positive for cough. Negative for shortness of  breath.   Cardiovascular: Negative for chest pain, palpitations and leg swelling.  Gastrointestinal: Negative for abdominal pain, blood in stool and nausea.  Genitourinary: Negative for dysuria and frequency.  Musculoskeletal: Positive for myalgias. Negative for falls.  Skin: Negative for rash.  Neurological: Negative for dizziness, loss of consciousness and headaches.  Endo/Heme/Allergies: Negative for environmental allergies.  Psychiatric/Behavioral: Negative for depression. The patient is not nervous/anxious.        Objective:    Physical Exam Constitutional:      Appearance: Normal appearance. She is not ill-appearing.  HENT:     Head: Normocephalic and atraumatic.     Nose: Nose normal.  Eyes:     General:        Right eye: No discharge.        Left eye: No discharge.  Pulmonary:     Effort: Pulmonary effort is normal.  Neurological:     Mental Status: She is alert and oriented to person, place, and time.  Psychiatric:        Mood and Affect: Mood normal.        Behavior: Behavior normal.     BP 135/64 (BP Location: Left Arm, Patient Position: Sitting, Cuff Size: Normal)   Pulse 63   SpO2 97%  Wt Readings from Last 3 Encounters:  06/23/19 (!) 302 lb 12.8 oz (137.3 kg)  04/18/19 (!) 315 lb (142.9 kg)  01/06/19 (!) 312 lb (141.5 kg)    Diabetic Foot Exam - Simple   No data filed     Lab Results  Component Value Date   WBC 9.4 10/06/2019   HGB 12.6  10/06/2019   HCT 38.7 10/06/2019   PLT 321.0 10/06/2019   GLUCOSE 103 (H) 10/06/2019   CHOL 99 10/06/2019   TRIG 268.0 (H) 10/06/2019   HDL 21.10 (L) 10/06/2019   LDLDIRECT 50.0 10/06/2019   LDLCALC 13 10/13/2017   ALT 13 10/06/2019   AST 13 10/06/2019   NA 137 10/06/2019   K 4.6 10/06/2019   CL 102 10/06/2019   CREATININE 0.95 10/06/2019   BUN 16 10/06/2019   CO2 27 10/06/2019   TSH 1.97 10/06/2019   INR 1.03 03/04/2011   HGBA1C 6.8 (H) 10/06/2019   MICROALBUR 19.6 (H) 01/29/2016    Lab Results  Component Value Date   TSH 1.97 10/06/2019   Lab Results  Component Value Date   WBC 9.4 10/06/2019   HGB 12.6 10/06/2019   HCT 38.7 10/06/2019   MCV 92.7 10/06/2019   PLT 321.0 10/06/2019   Lab Results  Component Value Date   NA 137 10/06/2019   K 4.6 10/06/2019   CO2 27 10/06/2019   GLUCOSE 103 (H) 10/06/2019   BUN 16 10/06/2019   CREATININE 0.95 10/06/2019   BILITOT 0.5 10/06/2019   ALKPHOS 103 10/06/2019   AST 13 10/06/2019   ALT 13 10/06/2019   PROT 6.8 10/06/2019   ALBUMIN 4.0 10/06/2019   CALCIUM 9.4 10/06/2019   GFR 62.47 10/06/2019   Lab Results  Component Value Date   CHOL 99 10/06/2019   Lab Results  Component Value Date   HDL 21.10 (L) 10/06/2019   Lab Results  Component Value Date   LDLCALC 13 10/13/2017   Lab Results  Component Value Date   TRIG 268.0 (H) 10/06/2019   Lab Results  Component Value Date   CHOLHDL 5 10/06/2019   Lab Results  Component Value Date   HGBA1C 6.8 (H) 10/06/2019  Assessment & Plan:   Problem List Items Addressed This Visit    Diabetes mellitus type 2 in obese ([place])    hgba1c acceptable, minimize simple carbs. Increase exercise as tolerated. Continue current meds      Relevant Medications   sitaGLIPtin (JANUVIA) 100 MG tablet   atorvastatin (LIPITOR) 80 MG tablet   glipiZIDE (GLUCOTROL) 10 MG tablet   HTN (hypertension)    Well controlled, no changes to meds. Encouraged heart healthy diet such  as the DASH diet and exercise as tolerated.       Relevant Medications   atorvastatin (LIPITOR) 80 MG tablet   Hyperlipidemia, mixed    Tolerating statin, encouraged heart healthy diet, avoid trans fats, minimize simple carbs and saturated fats. Increase exercise as tolerated      Relevant Medications   atorvastatin (LIPITOR) 80 MG tablet   Anxiety   Relevant Medications   LORazepam (ATIVAN) 0.5 MG tablet   Contact with and (suspected) exposure to covid-87    Her 50 year old son has tested positive and is doing well but now she, her husband and an 22 month olf foster daughter have symptoms. She has been tested but does not know results yet. She may use Tessalon perles prn, mucinex bid, Vit C [phone] bid, zinc 30 mg qd, ASA 81 mg bid, Pepcid 20 mg bid, Melatonin 5-10 mg qhs, MVI qd, Encouraged increased rest and hydration, add probiotics, zinc such as Coldeze or Xicam. Treat fevers as needed         I have changed Carlus Pavlov "[place]"'s Vitamin D (Ergocalciferol). I am also having her start on benzonatate. Additionally, I am having her maintain her ibuprofen, FreeStyle Freedom Lite, freestyle, glucose blood, albuterol, Trulicity, lisinopril, triamterene-hydrochlorothiazide, metFORMIN, metoprolol tartrate, LORazepam, gabapentin, sitaGLIPtin, atorvastatin, montelukast, glipiZIDE, and Premarin.  Meds ordered this encounter  Medications  . benzonatate (TESSALON) 100 MG capsule    Sig: Take 1-2 capsules (100-200 mg total) by mouth 2 (two) times daily as needed for cough.    Dispense:  40 capsule    Refill:  0  . LORazepam (ATIVAN) 0.5 MG tablet    Sig: TAKE 1 TABLET TWICE DAILY AS NEEDED FOR ANXIETY OR SLEEP    Dispense:  40 tablet    Refill:  3  . gabapentin (NEURONTIN) 300 MG capsule    Sig: TAKE 1 CAPSULE TWICE A DAY AND 2 CAPSULES AT BEDTIME    Dispense:  360 capsule    Refill:  1  . sitaGLIPtin (JANUVIA) 100 MG tablet    Sig: Take 1 tablet (100 mg total) by mouth daily.     Dispense:  90 tablet    Refill:  1  . atorvastatin (LIPITOR) 80 MG tablet    Sig: Take 1 tablet (80 mg total) by mouth daily.    Dispense:  90 tablet    Refill:  3    D/c previous script  . montelukast (SINGULAIR) 10 MG tablet    Sig: Take 1 tablet (10 mg total) by mouth at bedtime.    Dispense:  90 tablet    Refill:  1  . Vitamin D, Ergocalciferol, (DRISDOL) 1.25 MG (50000 UNIT) CAPS capsule    Sig: TAKE 1 CAPSULE EVERY 7 DAYS    Dispense:  16 capsule    Refill:  1  . glipiZIDE (GLUCOTROL) 10 MG tablet    Sig: Take 1 tablet (10 mg total) by mouth 2 (two) times daily.    Dispense:  180 tablet  Refill:  1  . conjugated estrogens (PREMARIN) vaginal cream    Sig: Small amount topically to vaginal mucosa 3 x weekly    Dispense:  42.5 g    Refill:  1     I discussed the assessment and treatment plan with the patient. The patient was provided an opportunity to ask questions and all were answered. The patient agreed with the plan and demonstrated an understanding of the instructions.   The patient was advised to call back or seek an in-person evaluation if the symptoms worsen or if the condition fails to improve as anticipated.  I provided 25 minutes of non-face-to-face time during this encounter.   Penni Homans, MD

## 2020-01-17 MED ORDER — GABAPENTIN 300 MG PO CAPS: ORAL_CAPSULE | ORAL | 1 refills | Status: DC

## 2020-01-17 MED ORDER — GLIPIZIDE 10 MG PO TABS: 10.0000 mg | ORAL_TABLET | Freq: Two times a day (BID) | ORAL | 1 refills | Status: DC

## 2020-01-17 MED ORDER — ATORVASTATIN CALCIUM 80 MG PO TABS: 80.0000 mg | ORAL_TABLET | Freq: Every day | ORAL | 3 refills | Status: DC

## 2020-01-17 MED ORDER — SITAGLIPTIN PHOSPHATE 100 MG PO TABS: 100.0000 mg | ORAL_TABLET | Freq: Every day | ORAL | 1 refills | Status: DC

## 2020-01-17 MED ORDER — VITAMIN D (ERGOCALCIFEROL) 1.25 MG (50000 UNIT) PO CAPS: ORAL_CAPSULE | ORAL | 1 refills | Status: DC

## 2020-01-17 MED ORDER — MONTELUKAST SODIUM 10 MG PO TABS: 10.0000 mg | ORAL_TABLET | Freq: Every day | ORAL | 1 refills | Status: DC

## 2020-02-24 ENCOUNTER — Telehealth: Payer: Self-pay

## 2020-02-24 NOTE — Telephone Encounter (Addendum)
Pt's Spouse, Fayrene Fearing, dropped off [employer] [place] [employer] form for completion for adoption services division.   Please call pt [phone]) once form is completed or MyChart msg the pt once form is completed so she or her Spouse can come pick it up.  Form placed in Dr. Mariel Aloe box for pick up and completion.

## 2020-02-24 NOTE — Telephone Encounter (Deleted)
Please call pt [phone]) once form is completed or MyChart msg the pt once form is completed so she or her Spouse can come pick it up.  Form placed in Dr. Mariel Aloe box for pick up and completion.

## 2020-02-27 NOTE — Telephone Encounter (Signed)
Lab is signed

## 2020-02-27 NOTE — Telephone Encounter (Signed)
Form is in your yellow folder.  Looks like she filled out the form herself mostly.  Last visit with you was 01/13/20.

## 2020-03-01 NOTE — Telephone Encounter (Signed)
Patient was notified by Windell Moulding

## 2020-04-06 ENCOUNTER — Other Ambulatory Visit: Payer: Self-pay | Admitting: Family Medicine

## 2020-04-06 ENCOUNTER — Telehealth: Payer: Self-pay | Admitting: *Deleted

## 2020-04-06 DIAGNOSIS — F419 Anxiety disorder, unspecified: Secondary | ICD-10-CM

## 2020-04-06 MED ORDER — LORAZEPAM 0.5 MG PO TABS: ORAL_TABLET | ORAL | 0 refills | Status: DC

## 2020-04-06 NOTE — Telephone Encounter (Signed)
I have sent to her mail order

## 2020-04-06 NOTE — Telephone Encounter (Signed)
[facility] sent over a request for lorazepam.    Left message on machine for patient to call back to double check to see if she will be using mail order for this medication since the last fill was filled at local pharmacy.

## 2020-04-06 NOTE — Telephone Encounter (Signed)
Patient called back and she would like to use [facility] for her refills on her her lorazepam.   Last written: 01/13/20 Last ov: 01/13/20 Next ov: 04/16/20 Contract: none UDS: 07/18/19

## 2020-04-11 ENCOUNTER — Other Ambulatory Visit: Payer: Self-pay | Admitting: Family Medicine

## 2020-04-16 ENCOUNTER — Other Ambulatory Visit: Payer: Self-pay | Admitting: Family Medicine

## 2020-04-16 ENCOUNTER — Other Ambulatory Visit: Payer: Self-pay

## 2020-04-16 ENCOUNTER — Telehealth: Payer: Self-pay | Admitting: Family Medicine

## 2020-04-16 ENCOUNTER — Ambulatory Visit (INDEPENDENT_AMBULATORY_CARE_PROVIDER_SITE_OTHER): Admitting: Family Medicine

## 2020-04-16 DIAGNOSIS — I10 Essential (primary) hypertension: Secondary | ICD-10-CM | POA: Diagnosis not present

## 2020-04-16 DIAGNOSIS — D649 Anemia, unspecified: Secondary | ICD-10-CM

## 2020-04-16 DIAGNOSIS — E875 Hyperkalemia: Secondary | ICD-10-CM

## 2020-04-16 DIAGNOSIS — E782 Mixed hyperlipidemia: Secondary | ICD-10-CM

## 2020-04-16 DIAGNOSIS — E669 Obesity, unspecified: Secondary | ICD-10-CM | POA: Diagnosis not present

## 2020-04-16 DIAGNOSIS — E1169 Type 2 diabetes mellitus with other specified complication: Secondary | ICD-10-CM | POA: Diagnosis not present

## 2020-04-16 LAB — CBC
HCT: 36 % (ref 36.0–46.0)
Hemoglobin: 11.8 g/dL — ABNORMAL LOW (ref 12.0–15.0)
MCHC: 32.9 g/dL (ref 30.0–36.0)
MCV: 91.2 fl (ref 78.0–100.0)
Platelets: 338 10*3/uL (ref 150.0–400.0)
RBC: 3.94 Mil/uL (ref 3.87–5.11)
RDW: 13.5 % (ref 11.5–15.5)
WBC: 9.3 10*3/uL (ref 4.0–10.5)

## 2020-04-16 LAB — LIPID PANEL
Cholesterol: 98 mg/dL (ref 0–200)
HDL: 21.6 mg/dL — ABNORMAL LOW (ref >39.00)
LDL Cholesterol: 37 mg/dL (ref 0–99)
NonHDL: 75.91
Total CHOL/HDL Ratio: 5
Triglycerides: 197 mg/dL — ABNORMAL HIGH (ref 0.0–149.0)
VLDL: 39.4 mg/dL (ref 0.0–40.0)

## 2020-04-16 LAB — COMPREHENSIVE METABOLIC PANEL
ALT: 14 U/L (ref 0–35)
AST: 15 U/L (ref 0–37)
Albumin: 4 g/dL (ref 3.5–5.2)
Alkaline Phosphatase: 90 U/L (ref 39–117)
BUN: 15 mg/dL (ref 6–23)
CO2: 30 mEq/L (ref 19–32)
Calcium: 9.2 mg/dL (ref 8.4–10.5)
Chloride: 102 mEq/L (ref 96–112)
Creatinine, Ser: 0.8 mg/dL (ref 0.40–1.20)
GFR: 76.01 mL/min (ref >60.00)
Glucose, Bld: 68 mg/dL — ABNORMAL LOW (ref 70–99)
Potassium: 5.5 mEq/L — ABNORMAL HIGH (ref 3.5–5.1)
Sodium: 137 mEq/L (ref 135–145)
Total Bilirubin: 0.6 mg/dL (ref 0.2–1.2)
Total Protein: 6.5 g/dL (ref 6.0–8.3)

## 2020-04-16 LAB — HEMOGLOBIN A1C: Hgb A1c MFr Bld: 6.2 % (ref 4.6–6.5)

## 2020-04-16 LAB — TSH: TSH: 2.38 u[IU]/mL (ref 0.35–4.50)

## 2020-04-16 NOTE — Patient Instructions (Signed)
Omron Blood Pressure cuff, upper arm, want BP 100-140/60-90 Pulse oximeter, want oxygen in 90s  Weekly vitals  Take Multivitamin with minerals, selenium Vitamin D 1000-2000 IU daily Probiotic with lactobacillus and bifidophilus Asprin EC 81 mg daily Fish oil or krill oil daily Melatonin 2-5 mg at bedtime  [email or web address]  The mRNA technology has been in development for 20 years and we already had the Coronavirus family of viruses (which usually just cause the common cold) genetically mapped already which is why we were able to come up with viable vaccine candidates so quickly in stage 1, then stage 2 scientifically took the correct amount of time what we did to speed it up was just build the manufacturing platform at the same time we were running the experiments so if it worked we could produce faster. And stage 3 has now had many months and millions of people immunized and we are seeing the immunity hold for over 9 months now with sign of it dissipating and no significant numbers of adverse reactions.  During every flu season we see 2 anaphylactic reactions for every million shots given and we initially thought we would see 11 per million with the COVID vaccine but now we see only 2-3 with Moderna and 5 or so with Pfizer so compared to someone is dying every 20 minutes from COVID and more deadly and infectious strains are coming it is definitely best when weighing the risks and benefits to take the shots.  Another pooled analysis of the 5 most utilized vaccines in the world shows that after full immunization so far no one has died from COVID.  

## 2020-04-16 NOTE — Assessment & Plan Note (Signed)
Well controlled, no changes to meds. Encouraged heart healthy diet such as the DASH diet and exercise as tolerated.  °

## 2020-04-16 NOTE — Telephone Encounter (Signed)
Pt calling in regards to lab results, please advise

## 2020-04-16 NOTE — Assessment & Plan Note (Signed)
Encouraged heart healthy diet, increase exercise, avoid trans fats, consider a krill oil cap daily 

## 2020-04-16 NOTE — Progress Notes (Signed)
Subjective:    Patient ID: April Warren, female    DOB: 10/14/1971, 49 y.o.   MRN: [id number]  Chief Complaint  Patient presents with  . 3 month follow up    HPI Patient is in today for follow up on chronic medical concerns. She now has 5 kids in the house the oldest is 32 and she is in the process of trying to adopt him and then 4 under 72 yo. One of them is adopted she stays very busy caring for everyone but is also finding time to get to the gym and eat better. She is very pleased with her weight loss and reports she feels better. Denies CP/palp/SOB/HA/congestion/fevers/GI or GU c/o. Taking meds as prescribed  Past Medical History:  Diagnosis Date  . Allergy   . Chicken pox 49 yrs old  . Diabetes mellitus 08-12-12   type 2- dr Zigmund Daniel diagnosed  . Diabetic retinopathy ([place])   . Dysmenorrhea 08/20/2012  . HTN (hypertension) 08/20/2012  . Hyperlipidemia, mixed 08/28/2013  . Neuropathy 02/16/2017  . Obesity   . Obesity, unspecified 08/28/2013  . Other and unspecified hyperlipidemia 08/28/2013  . Preventative health care 08/20/2012  . Sinusitis 11/14/2013  . Sinusitis, acute 05/02/2016    Past Surgical History:  Procedure Laterality Date  . REFRACTIVE SURGERY  08-12-12   right, left on 09/16/12    Family History  Problem Relation Age of Onset  . Cancer Mother 71       breast  . Diabetes Mother        type 2  . Hypertension Mother   . Hypertension Father   . Hyperlipidemia Father   . COPD Father   . Hypertension Maternal Grandmother   . Hyperlipidemia Maternal Grandmother   . COPD Maternal Grandmother   . Asthma Maternal Grandmother   . Hypertension Maternal Grandfather   . Heart attack Maternal Grandfather   . Gout Brother   . Cancer Brother        lung  . Heart disease Paternal Grandfather     Social History   Socioeconomic History  . Marital status: Married    Spouse name: Not on file  . Number of children: Not on file  . Years of education: Not on file  .  Highest education level: Not on file  Occupational History  . Not on file  Tobacco Use  . Smoking status: Never Smoker  . Smokeless tobacco: Never Used  Substance and Sexual Activity  . Alcohol use: Yes    Comment: socially  . Drug use: No  . Sexual activity: Yes    Partners: Male  Other Topics Concern  . Not on file  Social History Narrative   Lives with husband.    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    Not exercising   Social Determinants of Health   Financial Resource Strain:   . Difficulty of Paying Living Expenses:   Food Insecurity:   . Worried About Charity fundraiser in the Last Year:   . Arboriculturist in the Last Year:   Transportation Needs:   . Film/video editor (Medical):   Marland Kitchen Lack of Transportation (Non-Medical):   Physical Activity:   . Days of Exercise per Week:   . Minutes of Exercise per Session:   Stress:   . Feeling of Stress :   Social Connections:   .  Frequency of Communication with Friends and Family:   . Frequency of Social Gatherings with Friends and Family:   . Attends Religious Services:   . Active Member of Clubs or Organizations:   . Attends Archivist Meetings:   Marland Kitchen Marital Status:   Intimate Partner Violence:   . Fear of Current or Ex-Partner:   . Emotionally Abused:   Marland Kitchen Physically Abused:   . Sexually Abused:     Outpatient Medications Prior to Visit  Medication Sig Dispense Refill  . albuterol (PROVENTIL HFA;VENTOLIN HFA) 108 (90 Base) MCG/ACT inhaler Inhale 2 puffs into the lungs every 6 (six) hours as needed for wheezing or shortness of breath. 1 Inhaler 5  . atorvastatin (LIPITOR) 80 MG tablet Take 1 tablet (80 mg total) by mouth daily. 90 tablet 3  . benzonatate (TESSALON) 100 MG capsule Take 1-2 capsules (100-200 mg total) by mouth 2 (two) times daily as needed for cough. 40 capsule 0  . Blood Glucose  Monitoring Suppl (FREESTYLE FREEDOM LITE) w/Device KIT Check blood sugar twice daily 1 each 0  . gabapentin (NEURONTIN) 300 MG capsule TAKE 1 CAPSULE TWICE A DAY AND 2 CAPSULES AT BEDTIME 360 capsule 1  . glipiZIDE (GLUCOTROL) 10 MG tablet Take 1 tablet (10 mg total) by mouth 2 (two) times daily. 180 tablet 1  . glucose blood test strip Use as instructed 100 each 12  . ibuprofen (ADVIL,MOTRIN) 200 MG tablet Take 400 mg by mouth every 8 (eight) hours as needed.    . Lancets (FREESTYLE) lancets Check blood sugar twice daily 200 each 5  . lisinopril (ZESTRIL) 20 MG tablet TAKE 1 TABLET TWICE A DAY 180 tablet 3  . LORazepam (ATIVAN) 0.5 MG tablet TAKE 1 TABLET TWICE DAILY AS NEEDED FOR ANXIETY OR SLEEP 120 tablet 0  . metFORMIN (GLUCOPHAGE-XR) 750 MG 24 hr tablet TAKE 1 TABLET THREE TIMES A DAY 270 tablet 3  . metoprolol tartrate (LOPRESSOR) 50 MG tablet TAKE 1 TABLET TWICE A DAY 180 tablet 3  . montelukast (SINGULAIR) 10 MG tablet Take 1 tablet (10 mg total) by mouth at bedtime. 90 tablet 1  . sitaGLIPtin (JANUVIA) 100 MG tablet Take 1 tablet (100 mg total) by mouth daily. 90 tablet 1  . triamterene-hydrochlorothiazide (MAXZIDE-25) 37.5-25 MG tablet TAKE 1 TABLET DAILY 90 tablet 3  . TRULICITY 8.85 OY/7.7AJ SOPN INJECT 0.75 MG UNDER THE SKIN ONCE A WEEK 6 mL 3  . Vitamin D, Ergocalciferol, (DRISDOL) 1.25 MG (50000 UNIT) CAPS capsule TAKE 1 CAPSULE EVERY 7 DAYS 16 capsule 1  . conjugated estrogens (PREMARIN) vaginal cream Small amount topically to vaginal mucosa 3 x weekly 42.5 g 1   No facility-administered medications prior to visit.    No Known Allergies  Review of Systems  Constitutional: Negative for fever and malaise/fatigue.  HENT: Negative for congestion.   Eyes: Negative for blurred vision.  Respiratory: Negative for shortness of breath.   Cardiovascular: Negative for chest pain, palpitations and leg swelling.  Gastrointestinal: Negative for abdominal pain, blood in stool and nausea.   Genitourinary: Negative for dysuria and frequency.  Musculoskeletal: Negative for falls.  Skin: Negative for rash.  Neurological: Negative for dizziness, loss of consciousness and headaches.  Endo/Heme/Allergies: Negative for environmental allergies.  Psychiatric/Behavioral: Negative for depression. The patient is not nervous/anxious.        Objective:    Physical Exam Vitals and nursing note reviewed.  Constitutional:      General: She is not in acute distress.  Appearance: She is well-developed.  HENT:     Head: Normocephalic and atraumatic.     Nose: Nose normal.  Eyes:     General:        Right eye: No discharge.        Left eye: No discharge.  Cardiovascular:     Rate and Rhythm: Normal rate and regular rhythm.     Heart sounds: No murmur.  Pulmonary:     Effort: Pulmonary effort is normal.     Breath sounds: Normal breath sounds.  Abdominal:     General: Bowel sounds are normal.     Palpations: Abdomen is soft.     Tenderness: There is no abdominal tenderness.  Musculoskeletal:     Cervical back: Normal range of motion and neck supple.  Skin:    General: Skin is warm and dry.  Neurological:     Mental Status: She is alert and oriented to person, place, and time.     BP 118/86 (BP Location: Left Arm, Cuff Size: Large)   Pulse 66   Temp 97.7 F (36.5 C) (Temporal)   Resp 12   Ht _0  (1.778 m)   Wt 291 lb (132 kg)   SpO2 98%   BMI 41.75 kg/m  Wt Readings from Last 3 Encounters:  04/16/20 291 lb (132 kg)  06/23/19 (!) 302 lb 12.8 oz (137.3 kg)  04/18/19 (!) 315 lb (142.9 kg)    Diabetic Foot Exam - Simple   No data filed     Lab Results  Component Value Date   WBC 9.4 10/06/2019   HGB 12.6 10/06/2019   HCT 38.7 10/06/2019   PLT 321.0 10/06/2019   GLUCOSE 103 (H) 10/06/2019   CHOL 99 10/06/2019   TRIG 268.0 (H) 10/06/2019   HDL 21.10 (L) 10/06/2019   LDLDIRECT 50.0 10/06/2019   LDLCALC 13 10/13/2017   ALT 13 10/06/2019   AST 13  10/06/2019   NA 137 10/06/2019   K 4.6 10/06/2019   CL 102 10/06/2019   CREATININE 0.95 10/06/2019   BUN 16 10/06/2019   CO2 27 10/06/2019   TSH 1.97 10/06/2019   INR 1.03 03/04/2011   HGBA1C 6.8 (H) 10/06/2019   MICROALBUR 19.6 (H) 01/29/2016    Lab Results  Component Value Date   TSH 1.97 10/06/2019   Lab Results  Component Value Date   WBC 9.4 10/06/2019   HGB 12.6 10/06/2019   HCT 38.7 10/06/2019   MCV 92.7 10/06/2019   PLT 321.0 10/06/2019   Lab Results  Component Value Date   NA 137 10/06/2019   K 4.6 10/06/2019   CO2 27 10/06/2019   GLUCOSE 103 (H) 10/06/2019   BUN 16 10/06/2019   CREATININE 0.95 10/06/2019   BILITOT 0.5 10/06/2019   ALKPHOS 103 10/06/2019   AST 13 10/06/2019   ALT 13 10/06/2019   PROT 6.8 10/06/2019   ALBUMIN 4.0 10/06/2019   CALCIUM 9.4 10/06/2019   GFR 62.47 10/06/2019   Lab Results  Component Value Date   CHOL 99 10/06/2019   Lab Results  Component Value Date   HDL 21.10 (L) 10/06/2019   Lab Results  Component Value Date   LDLCALC 13 10/13/2017   Lab Results  Component Value Date   TRIG 268.0 (H) 10/06/2019   Lab Results  Component Value Date   CHOLHDL 5 10/06/2019   Lab Results  Component Value Date   HGBA1C 6.8 (H) 10/06/2019       Assessment &  Plan:   Problem List Items Addressed This Visit    Diabetes mellitus type 2 in obese ([place])    hgba1c acceptable, minimize simple carbs. Increase exercise as tolerated. Continue current meds      Relevant Orders   Hemoglobin A1c   Obesity    Encouraged DASH diet, decrease po intake and increase exercise as tolerated. Needs 7-8 hours of sleep nightly. Avoid trans fats, eat small, frequent meals every 4-5 hours with lean proteins, complex carbs and healthy fats. Minimize simple carbs, has lost a good bit of weight. Marland Kitchen      HTN (hypertension)    Well controlled, no changes to meds. Encouraged heart healthy diet such as the DASH diet and exercise as tolerated.        Relevant Orders   CBC   Comprehensive metabolic panel   TSH   Hyperlipidemia, mixed    Encouraged heart healthy diet, increase exercise, avoid trans fats, consider a krill oil cap daily      Relevant Orders   Lipid panel      I have discontinued April Warren "Loleta"'s Premarin. I am also having her maintain her ibuprofen, FreeStyle Freedom Lite, freestyle, glucose blood, albuterol, lisinopril, triamterene-hydrochlorothiazide, metFORMIN, metoprolol tartrate, benzonatate, atorvastatin, gabapentin, glipiZIDE, montelukast, sitaGLIPtin, Vitamin D (Ergocalciferol), LORazepam, and Trulicity.  No orders of the defined types were placed in this encounter.    Penni Homans, MD

## 2020-04-16 NOTE — Assessment & Plan Note (Signed)
hgba1c acceptable, minimize simple carbs. Increase exercise as tolerated. Continue current meds 

## 2020-04-16 NOTE — Assessment & Plan Note (Signed)
Encouraged DASH diet, decrease po intake and increase exercise as tolerated. Needs 7-8 hours of sleep nightly. Avoid trans fats, eat small, frequent meals every 4-5 hours with lean proteins, complex carbs and healthy fats. Minimize simple carbs, has lost a good bit of weight.  

## 2020-04-16 NOTE — Telephone Encounter (Signed)
See result notes. 

## 2020-04-22 ENCOUNTER — Encounter: Payer: Self-pay | Admitting: Family Medicine

## 2020-04-23 ENCOUNTER — Other Ambulatory Visit

## 2020-04-23 ENCOUNTER — Telehealth: Payer: Self-pay | Admitting: *Deleted

## 2020-04-23 NOTE — Telephone Encounter (Signed)
[facility] requesting a 90 day refill on lorazepam.  Last fill was 04/06/20 to them.    Patient was seen last on 04/16/20

## 2020-04-23 NOTE — Telephone Encounter (Signed)
#  120 is a 3 month supply unless her use has gone up. She gets #40 per month because while she can take 2 a day it is only as needed and I do not expect her to take 2 every day. OK to send in a refill on the 3 month supply if needed

## 2020-04-27 ENCOUNTER — Other Ambulatory Visit: Payer: Self-pay | Admitting: Family Medicine

## 2020-05-14 ENCOUNTER — Other Ambulatory Visit: Payer: Self-pay | Admitting: Family Medicine

## 2020-05-23 ENCOUNTER — Other Ambulatory Visit: Payer: Self-pay | Admitting: Family Medicine

## 2020-05-23 DIAGNOSIS — F419 Anxiety disorder, unspecified: Secondary | ICD-10-CM

## 2020-05-23 NOTE — Telephone Encounter (Signed)
Requesting: ativan Contract:n/a /UDS:07/18/19 /Last Visit:04/16/20 Next Visit:n/a Last Refill:04/06/20  Please Advise

## 2020-07-25 ENCOUNTER — Other Ambulatory Visit: Payer: Self-pay | Admitting: Family Medicine

## 2020-07-25 DIAGNOSIS — F419 Anxiety disorder, unspecified: Secondary | ICD-10-CM

## 2020-08-27 ENCOUNTER — Other Ambulatory Visit: Payer: Self-pay | Admitting: Family Medicine

## 2020-08-27 DIAGNOSIS — E1169 Type 2 diabetes mellitus with other specified complication: Secondary | ICD-10-CM

## 2020-08-28 ENCOUNTER — Other Ambulatory Visit: Payer: Self-pay | Admitting: Family Medicine

## 2020-09-06 ENCOUNTER — Encounter: Payer: Self-pay | Admitting: Family Medicine

## 2020-09-11 ENCOUNTER — Ambulatory Visit (INDEPENDENT_AMBULATORY_CARE_PROVIDER_SITE_OTHER): Admitting: Family Medicine

## 2020-09-11 ENCOUNTER — Other Ambulatory Visit: Payer: Self-pay

## 2020-09-11 VITALS — BP 124/78 | HR 71 | Temp 97.8°F | Resp 14 | Ht 70.0 in | Wt 284.4 lb

## 2020-09-11 DIAGNOSIS — I10 Essential (primary) hypertension: Secondary | ICD-10-CM

## 2020-09-11 DIAGNOSIS — T7840XA Allergy, unspecified, initial encounter: Secondary | ICD-10-CM

## 2020-09-11 DIAGNOSIS — E1169 Type 2 diabetes mellitus with other specified complication: Secondary | ICD-10-CM

## 2020-09-11 DIAGNOSIS — E669 Obesity, unspecified: Secondary | ICD-10-CM

## 2020-09-11 DIAGNOSIS — E782 Mixed hyperlipidemia: Secondary | ICD-10-CM

## 2020-09-11 DIAGNOSIS — Z23 Encounter for immunization: Secondary | ICD-10-CM | POA: Diagnosis not present

## 2020-09-11 MED ORDER — CETIRIZINE HCL 10 MG PO TABS: 10.0000 mg | ORAL_TABLET | Freq: Every day | ORAL | 11 refills | Status: DC | PRN

## 2020-09-11 MED ORDER — TRIAMCINOLONE ACETONIDE 55 MCG/ACT NA AERO: 2.0000 | INHALATION_SPRAY | Freq: Every day | NASAL | 5 refills | Status: DC | PRN

## 2020-09-11 NOTE — Assessment & Plan Note (Signed)
Well controlled, no changes to meds. Encouraged heart healthy diet such as the DASH diet and exercise as tolerated.  °

## 2020-09-11 NOTE — Patient Instructions (Signed)
Allergies, Adult °An allergy is when your body's defense system (immune system) overreacts to an otherwise harmless substance (allergen) that you breathe in or eat or something that touches your skin. When you come into contact with something that you are allergic to, your immune system produces certain proteins (antibodies). These proteins cause cells to release chemicals (histamines) that trigger the symptoms of an allergic reaction. °Allergies often affect the nasal passages (allergic rhinitis), eyes (allergic conjunctivitis), skin (atopic dermatitis), and stomach. Allergies can be mild or severe. Allergies cannot spread from person to person (are not contagious). They can develop at any age and may be outgrown. °What increases the risk? °You may be at greater risk of allergies if other people in your family have allergies. °What are the signs or symptoms? °Symptoms depend on what type of allergy you have. They may include: °· Runny, stuffy nose. °· Sneezing. °· Itchy mouth, ears, or throat. °· Postnasal drip. °· Sore throat. °· Itchy, red, watery, or puffy eyes. °· Skin rash or hives. °· Stomach pain. °· Vomiting. °· Diarrhea. °· Bloating. °· Wheezing or coughing. °People with a severe allergy to food, medicine, or an insect bite may have a life-threatening allergic reaction (anaphylaxis). Symptoms of anaphylaxis include: °· Hives. °· Itching. °· Flushed face. °· Swollen lips, tongue, or mouth. °· Tight or swollen throat. °· Chest pain or tightness in the chest. °· Trouble breathing or shortness of breath. °· Rapid heartbeat. °· Dizziness or fainting. °· Vomiting. °· Diarrhea. °· Pain in the abdomen. °How is this diagnosed? °This condition is diagnosed based on: °· Your symptoms. °· Your family and medical history. °· A physical exam. °You may need to see a health care provider who specializes in treating allergies (allergist). You may also have tests, including: °· Skin tests to see which allergens are causing  your symptoms, such as: °? Skin prick test. In this test, your skin is pricked with a tiny needle and exposed to small amounts of possible allergens to see if your skin reacts. °? Intradermal skin test. In this test, a small amount of allergen is injected under your skin to see if your skin reacts. °? Patch test. In this test, a small amount of allergen is placed on your skin and then your skin is covered with a bandage. Your health care provider will check your skin after a couple of days to see if a rash has developed. °· Blood tests. °· Challenges tests. In this test, you inhale a small amount of allergen by mouth to see if you have an allergic reaction. °You may also be asked to: °· Keep a food diary. A food diary is a record of all the foods and drinks you have in a day and any symptoms you experience. °· Practice an elimination diet. An elimination diet involves eliminating specific foods from your diet and then adding them back in one by one to find out if a certain food causes an allergic reaction. °How is this treated? °Treatment for allergies depends on your symptoms. Treatment may include: °· Cold compresses to soothe itching and swelling. °· Eye drops. °· Nasal sprays. °· Using a saline spray or container (neti pot) to flush out the nose (nasal irrigation). These methods can help clear away mucus and keep the nasal passages moist. °· Using a humidifier. °· Oral antihistamines or other medicines to block allergic reaction and inflammation. °· Skin creams to treat rashes or itching. °· Diet changes to eliminate food allergy triggers. °·   Repeated exposure to tiny amounts of allergens to build up a tolerance and prevent future allergic reactions (immunotherapy). These include: °? Allergy shots. °? Oral treatment. This involves taking small doses of an allergen under the tongue (sublingual immunotherapy). °· Emergency epinephrine injection (auto-injector) in case of an allergic emergency. This is a  self-injectable, pre-measured medicine that must be given within the first few minutes of a serious allergic reaction. °Follow these instructions at home: ° °  ° °  ° °· Avoid known allergens whenever possible. °· If you suffer from airborne allergens, wash out your nose daily. You can do this with a saline spray or a neti pot to flush out your nose (nasal irrigation). °· Take over-the-counter and prescription medicines only as told by your health care provider. °· Keep all follow-up visits as told by your health care provider. This is important. °· If you are at risk of a severe allergic reaction (anaphylaxis), keep your auto-injector with you at all times. °· If you have ever had anaphylaxis, wear a medical alert bracelet or necklace that states you have a severe allergy. °Contact a health care provider if: °· Your symptoms do not improve with treatment. °Get help right away if: °· You have symptoms of anaphylaxis, such as: °? Swollen mouth, tongue, or throat. °? Pain or tightness in your chest. °? Trouble breathing or shortness of breath. °? Dizziness or fainting. °? Severe abdominal pain, vomiting, or diarrhea. °This information is not intended to replace advice given to you by your health care provider. Make sure you discuss any questions you have with your health care provider. °Document Revised: 03/10/2018 Document Reviewed: 07/02/2016 °Elsevier Patient Education © 2020 [employer] ° °

## 2020-09-11 NOTE — Assessment & Plan Note (Addendum)
Flared recently. Consider Zyrtec daily and Nasacort prescriptions sent to pharmacy

## 2020-09-11 NOTE — Assessment & Plan Note (Signed)
hgba1c acceptable, minimize simple carbs. Increase exercise as tolerated. Continue current meds 

## 2020-09-11 NOTE — Assessment & Plan Note (Signed)
Tolerating statin, encouraged heart healthy diet, avoid trans fats, minimize simple carbs and saturated fats. Increase exercise as tolerated 

## 2020-09-12 LAB — LIPID PANEL
Cholesterol: 94 mg/dL (ref <200)
HDL: 25 mg/dL — ABNORMAL LOW (ref >=50)
LDL Cholesterol (Calc): 39 mg/dL (calc)
Non-HDL Cholesterol (Calc): 69 mg/dL (calc) (ref <130)
Total CHOL/HDL Ratio: 3.8 (calc) (ref <5.0)
Triglycerides: 238 mg/dL — ABNORMAL HIGH (ref <150)

## 2020-09-12 LAB — COMPREHENSIVE METABOLIC PANEL
AG Ratio: 1.4 (calc) (ref 1.0–2.5)
ALT: 20 U/L (ref 6–29)
AST: 22 U/L (ref 10–35)
Albumin: 4.1 g/dL (ref 3.6–5.1)
Alkaline phosphatase (APISO): 103 U/L (ref 37–153)
BUN: 18 mg/dL (ref 7–25)
CO2: 20 mmol/L (ref 20–32)
Calcium: 9 mg/dL (ref 8.6–10.4)
Chloride: 102 mmol/L (ref 98–110)
Creat: 1.02 mg/dL (ref 0.50–1.05)
Globulin: 2.9 g/dL (calc) (ref 1.9–3.7)
Glucose, Bld: 76 mg/dL (ref 65–99)
Potassium: 5.4 mmol/L — ABNORMAL HIGH (ref 3.5–5.3)
Sodium: 137 mmol/L (ref 135–146)
Total Bilirubin: 0.4 mg/dL (ref 0.2–1.2)
Total Protein: 7 g/dL (ref 6.1–8.1)

## 2020-09-12 LAB — CBC
HCT: 39.1 % (ref 35.0–45.0)
Hemoglobin: 12.8 g/dL (ref 11.7–15.5)
MCH: 29.7 pg (ref 27.0–33.0)
MCHC: 32.7 g/dL (ref 32.0–36.0)
MCV: 90.7 fL (ref 80.0–100.0)
MPV: 10 fL (ref 7.5–12.5)
Platelets: 327 10*3/uL (ref 140–400)
RBC: 4.31 10*6/uL (ref 3.80–5.10)
RDW: 12.2 % (ref 11.0–15.0)
WBC: 8.8 10*3/uL (ref 3.8–10.8)

## 2020-09-12 LAB — HEMOGLOBIN A1C
Hgb A1c MFr Bld: 6 %{Hb} — ABNORMAL HIGH
Mean Plasma Glucose: 126 (calc)
eAG (mmol/L): 7 (calc)

## 2020-09-12 LAB — TSH: TSH: 1.4 mIU/L

## 2020-09-12 NOTE — Progress Notes (Signed)
Subjective:    Patient ID: April Warren, female    DOB: 01/11/1971, 49 y.o.   MRN: [id number]  Chief Complaint  Patient presents with  . Follow-up    left ear hurting- day 2 pt. states stopped up, no fever.Marland KitchenMarland KitchenMarland KitchenHx seasonal allergies    HPI Patient is in today for follow up on chronic medical concerns. She is doing well. No recent febrile illness or hospitalizations. She has 3 children 3 and under so needless to say she is struggling with fatigue and finding time for self care. She is trying to eat well and stays active. Denies CP/palp/SOB/HA/congestion/fevers/GI or GU c/o. Taking meds as prescribed. No polyuria or polydipsia.   Past Medical History:  Diagnosis Date  . Allergy   . Chicken pox 49 yrs old  . Diabetes mellitus 08-12-12   type 2- dr Zigmund Daniel diagnosed  . Diabetic retinopathy ([place])   . Dysmenorrhea 08/20/2012  . HTN (hypertension) 08/20/2012  . Hyperlipidemia, mixed 08/28/2013  . Neuropathy 02/16/2017  . Obesity   . Obesity, unspecified 08/28/2013  . Other and unspecified hyperlipidemia 08/28/2013  . Preventative health care 08/20/2012  . Sinusitis 11/14/2013  . Sinusitis, acute 05/02/2016    Past Surgical History:  Procedure Laterality Date  . REFRACTIVE SURGERY  08-12-12   right, left on 09/16/12    Family History  Problem Relation Age of Onset  . Cancer Mother 27       breast  . Diabetes Mother        type 2  . Hypertension Mother   . Hypertension Father   . Hyperlipidemia Father   . COPD Father   . Hypertension Maternal Grandmother   . Hyperlipidemia Maternal Grandmother   . COPD Maternal Grandmother   . Asthma Maternal Grandmother   . Hypertension Maternal Grandfather   . Heart attack Maternal Grandfather   . Gout Brother   . Cancer Brother        lung  . Heart disease Paternal Grandfather     Social History   Socioeconomic History  . Marital status: Married    Spouse name: Not on file  . Number of children: Not on file  . Years of education: Not  on file  . Highest education level: Not on file  Occupational History  . Not on file  Tobacco Use  . Smoking status: Never Smoker  . Smokeless tobacco: Never Used  Substance and Sexual Activity  . Alcohol use: Yes    Comment: socially  . Drug use: No  . Sexual activity: Yes    Partners: Male  Other Topics Concern  . Not on file  Social History Narrative   Lives with husband.    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    Not exercising   Social Determinants of Health   Financial Resource Strain:   . Difficulty of Paying Living Expenses: Not on file  Food Insecurity:   . Worried About Charity fundraiser in the Last Year: Not on file  . Ran Out of Food in the Last Year: Not on file  Transportation Needs:   . Lack of Transportation (Medical): Not on file  . Lack of Transportation (Non-Medical): Not on file  Physical Activity:   . Days of Exercise per Week: Not on file  . Minutes of Exercise per Session: Not on file  Stress:   . Feeling of Stress :  Not on file  Social Connections:   . Frequency of Communication with Friends and Family: Not on file  . Frequency of Social Gatherings with Friends and Family: Not on file  . Attends Religious Services: Not on file  . Active Member of Clubs or Organizations: Not on file  . Attends Club or Organization Meetings: Not on file  . Marital Status: Not on file  Intimate Partner Violence:   . Fear of Current or Ex-Partner: Not on file  . Emotionally Abused: Not on file  . Physically Abused: Not on file  . Sexually Abused: Not on file    Outpatient Medications Prior to Visit  Medication Sig Dispense Refill  . atorvastatin (LIPITOR) 80 MG tablet Take 1 tablet (80 mg total) by mouth daily. 90 tablet 3  . benzonatate (TESSALON) 100 MG capsule Take 1-2 capsules (100-200 mg total) by mouth 2 (two) times daily as needed for cough. 40 capsule  0  . Blood Glucose Monitoring Suppl (FREESTYLE FREEDOM LITE) w/Device KIT Check blood sugar twice daily 1 each 0  . gabapentin (NEURONTIN) 300 MG capsule TAKE 1 CAPSULE TWICE A DAY AND 2 CAPSULES AT BEDTIME 360 capsule 0  . glipiZIDE (GLUCOTROL) 10 MG tablet Take 1 tablet (10 mg total) by mouth 2 (two) times daily. 180 tablet 0  . glucose blood test strip Use as instructed 100 each 12  . ibuprofen (ADVIL,MOTRIN) 200 MG tablet Take 400 mg by mouth every 8 (eight) hours as needed.    . Lancets (FREESTYLE) lancets Check blood sugar twice daily 200 each 5  . lisinopril (ZESTRIL) 20 MG tablet TAKE 1 TABLET TWICE A DAY 180 tablet 3  . LORazepam (ATIVAN) 0.5 MG tablet TAKE 1 TABLET TWICE A DAY AS NEEDED FOR ANXIETY OR SLEEP 120 tablet 1  . metFORMIN (GLUCOPHAGE-XR) 750 MG 24 hr tablet TAKE 1 TABLET THREE TIMES A DAY 270 tablet 3  . metoprolol tartrate (LOPRESSOR) 50 MG tablet TAKE 1 TABLET TWICE A DAY 180 tablet 3  . montelukast (SINGULAIR) 10 MG tablet Take 1 tablet (10 mg total) by mouth at bedtime. 90 tablet 0  . PROAIR HFA 108 (90 Base) MCG/ACT inhaler USE 2 INHALATIONS EVERY 6 HOURS AS NEEDED FOR WHEEZING OR SHORTNESS OF BREATH 8.5 g 13  . sitaGLIPtin (JANUVIA) 100 MG tablet Take 1 tablet (100 mg total) by mouth daily. 90 tablet 1  . triamterene-hydrochlorothiazide (MAXZIDE-25) 37.5-25 MG tablet TAKE 1 TABLET DAILY 90 tablet 3  . TRULICITY 0.75 MG/0.5ML SOPN INJECT 0.75 MG UNDER THE SKIN ONCE A WEEK 6 mL 3  . Vitamin D, Ergocalciferol, (DRISDOL) 1.25 MG (50000 UNIT) CAPS capsule TAKE 1 CAPSULE EVERY 7 DAYS 16 capsule 1   No facility-administered medications prior to visit.    No Known Allergies  Review of Systems  Constitutional: Negative for fever and malaise/fatigue.  HENT: Positive for congestion.   Eyes: Negative for blurred vision.  Respiratory: Negative for shortness of breath.   Cardiovascular: Negative for chest pain, palpitations and leg swelling.  Gastrointestinal: Negative for  abdominal pain, blood in stool and nausea.  Genitourinary: Negative for dysuria and frequency.  Musculoskeletal: Negative for falls.  Skin: Negative for rash.  Neurological: Negative for dizziness, loss of consciousness and headaches.  Endo/Heme/Allergies: Negative for environmental allergies.  Psychiatric/Behavioral: Negative for depression. The patient is not nervous/anxious.        Objective:    Physical Exam Vitals and nursing note reviewed.  Constitutional:      General: She is   not in acute distress.    Appearance: She is well-developed.  HENT:     Head: Normocephalic and atraumatic.     Nose: Nose normal.  Eyes:     General:        Right eye: No discharge.        Left eye: No discharge.  Cardiovascular:     Rate and Rhythm: Normal rate and regular rhythm.     Heart sounds: No murmur heard.   Pulmonary:     Effort: Pulmonary effort is normal.     Breath sounds: Normal breath sounds.  Abdominal:     General: Bowel sounds are normal.     Palpations: Abdomen is soft.     Tenderness: There is no abdominal tenderness.  Musculoskeletal:     Cervical back: Normal range of motion and neck supple.  Skin:    General: Skin is warm and dry.  Neurological:     Mental Status: She is alert and oriented to person, place, and time.     BP 124/78 (BP Location: Left Arm, Patient Position: Sitting, Cuff Size: Large)   Pulse 71   Temp 97.8 F (36.6 C) (Oral)   Resp 14   Ht 5' 10" (1.778 m)   Wt 284 lb 6.4 oz (129 kg)   SpO2 100%   BMI 40.81 kg/m  Wt Readings from Last 3 Encounters:  09/11/20 284 lb 6.4 oz (129 kg)  04/16/20 291 lb (132 kg)  06/23/19 (!) 302 lb 12.8 oz (137.3 kg)    Diabetic Foot Exam - Simple   No data filed     Lab Results  Component Value Date   WBC 8.8 09/11/2020   HGB 12.8 09/11/2020   HCT 39.1 09/11/2020   PLT 327 09/11/2020   GLUCOSE 76 09/11/2020   CHOL 94 09/11/2020   TRIG 238 (H) 09/11/2020   HDL 25 (L) 09/11/2020   LDLDIRECT 50.0  10/06/2019   LDLCALC 39 09/11/2020   ALT 20 09/11/2020   AST 22 09/11/2020   NA 137 09/11/2020   K 5.4 (H) 09/11/2020   CL 102 09/11/2020   CREATININE 1.02 09/11/2020   BUN 18 09/11/2020   CO2 20 09/11/2020   TSH 1.40 09/11/2020   INR 1.03 03/04/2011   HGBA1C 6.0 (H) 09/11/2020   MICROALBUR 19.6 (H) 01/29/2016    Lab Results  Component Value Date   TSH 1.40 09/11/2020   Lab Results  Component Value Date   WBC 8.8 09/11/2020   HGB 12.8 09/11/2020   HCT 39.1 09/11/2020   MCV 90.7 09/11/2020   PLT 327 09/11/2020   Lab Results  Component Value Date   NA 137 09/11/2020   K 5.4 (H) 09/11/2020   CO2 20 09/11/2020   GLUCOSE 76 09/11/2020   BUN 18 09/11/2020   CREATININE 1.02 09/11/2020   BILITOT 0.4 09/11/2020   ALKPHOS 90 04/16/2020   AST 22 09/11/2020   ALT 20 09/11/2020   PROT 7.0 09/11/2020   ALBUMIN 4.0 04/16/2020   CALCIUM 9.0 09/11/2020   GFR 76.01 04/16/2020   Lab Results  Component Value Date   CHOL 94 09/11/2020   Lab Results  Component Value Date   HDL 25 (L) 09/11/2020   Lab Results  Component Value Date   LDLCALC 39 09/11/2020   Lab Results  Component Value Date   TRIG 238 (H) 09/11/2020   Lab Results  Component Value Date   CHOLHDL 3.8 09/11/2020   Lab Results  Component Value   Date   HGBA1C 6.0 (H) 09/11/2020       Assessment & Plan:   Problem List Items Addressed This Visit    Diabetes mellitus type 2 in obese (HCC)    hgba1c acceptable, minimize simple carbs. Increase exercise as tolerated. Continue current meds      Relevant Orders   Hemoglobin A1c (Completed)   HTN (hypertension)    Well controlled, no changes to meds. Encouraged heart healthy diet such as the DASH diet and exercise as tolerated.       Relevant Orders   CBC (Completed)   Comprehensive metabolic panel (Completed)   TSH (Completed)   Allergy    Flared recently. Consider Zyrtec daily and Nasacort prescriptions sent to pharmacy      Hyperlipidemia,  mixed - Primary    Tolerating statin, encouraged heart healthy diet, avoid trans fats, minimize simple carbs and saturated fats. Increase exercise as tolerated      Relevant Orders   Lipid panel (Completed)    Other Visit Diagnoses    Influenza vaccine administered       Relevant Orders   Flu Vaccine QUAD 36+ mos IM (Fluarix & Fluzone Quad PF (Completed)      I am having Marisela Laswell "Loleta" start on cetirizine and triamcinolone. I am also having her maintain her ibuprofen, FreeStyle Freedom Lite, freestyle, glucose blood, lisinopril, triamterene-hydrochlorothiazide, metFORMIN, metoprolol tartrate, benzonatate, atorvastatin, sitaGLIPtin, Vitamin D (Ergocalciferol), Trulicity, ProAir HFA, LORazepam, gabapentin, glipiZIDE, and montelukast.  Meds ordered this encounter  Medications  . cetirizine (ZYRTEC) 10 MG tablet    Sig: Take 1 tablet (10 mg total) by mouth daily as needed for allergies.    Dispense:  30 tablet    Refill:  11  . triamcinolone (NASACORT) 55 MCG/ACT AERO nasal inhaler    Sig: Place 2 sprays into the nose daily as needed.    Dispense:  1 each    Refill:  5     Stacey Blyth, MD 

## 2020-09-17 ENCOUNTER — Other Ambulatory Visit: Payer: Self-pay | Admitting: Family Medicine

## 2020-10-01 ENCOUNTER — Other Ambulatory Visit: Payer: Self-pay

## 2020-10-01 ENCOUNTER — Emergency Department (HOSPITAL_BASED_OUTPATIENT_CLINIC_OR_DEPARTMENT_OTHER): Admission: EM | Admit: 2020-10-01 | Discharge: 2020-10-01 | Disposition: A | Discharge status: Home / Self Care

## 2020-11-14 ENCOUNTER — Encounter: Payer: Self-pay | Admitting: Family Medicine

## 2020-11-14 ENCOUNTER — Other Ambulatory Visit: Payer: Self-pay | Admitting: Family Medicine

## 2020-11-14 DIAGNOSIS — F419 Anxiety disorder, unspecified: Secondary | ICD-10-CM

## 2020-11-14 DIAGNOSIS — E669 Obesity, unspecified: Secondary | ICD-10-CM

## 2020-11-14 DIAGNOSIS — E1169 Type 2 diabetes mellitus with other specified complication: Secondary | ICD-10-CM

## 2020-11-14 MED ORDER — LORAZEPAM 0.5 MG PO TABS: ORAL_TABLET | ORAL | 1 refills | Status: DC

## 2020-11-14 NOTE — Telephone Encounter (Signed)
Requesting: lorazepam 0.5mg   Contract: 02/12/2018 UDS: 07/18/2019 Last Visit: 09/11/2020 Next Visit: 03/21/2021 Last Refill:  07/25/2020 #120 and 1RF  Please Advise

## 2020-11-15 NOTE — Telephone Encounter (Signed)
Pharmacy called and Rx cancelled at [facility]

## 2020-11-16 ENCOUNTER — Other Ambulatory Visit: Payer: Self-pay

## 2020-11-16 DIAGNOSIS — T7840XD Allergy, unspecified, subsequent encounter: Secondary | ICD-10-CM

## 2020-11-16 MED ORDER — CETIRIZINE HCL 10 MG PO TABS: 10.0000 mg | ORAL_TABLET | Freq: Every day | ORAL | 1 refills | Status: DC | PRN

## 2020-11-26 ENCOUNTER — Other Ambulatory Visit: Payer: Self-pay | Admitting: Family Medicine

## 2020-12-06 ENCOUNTER — Other Ambulatory Visit: Payer: Self-pay | Admitting: Family Medicine

## 2020-12-07 NOTE — Telephone Encounter (Signed)
Last vitamin d was checked on 10/06/19 and was 36.88.  do you want to continue?

## 2021-01-17 ENCOUNTER — Encounter: Payer: Self-pay | Admitting: Family Medicine

## 2021-01-25 ENCOUNTER — Other Ambulatory Visit: Payer: Self-pay | Admitting: Family Medicine

## 2021-02-12 ENCOUNTER — Other Ambulatory Visit: Payer: Self-pay | Admitting: Family Medicine

## 2021-02-12 DIAGNOSIS — I10 Essential (primary) hypertension: Secondary | ICD-10-CM

## 2021-02-12 DIAGNOSIS — E1169 Type 2 diabetes mellitus with other specified complication: Secondary | ICD-10-CM

## 2021-02-12 DIAGNOSIS — E669 Obesity, unspecified: Secondary | ICD-10-CM

## 2021-03-07 ENCOUNTER — Other Ambulatory Visit: Payer: Self-pay | Admitting: Family Medicine

## 2021-03-13 ENCOUNTER — Other Ambulatory Visit: Payer: Self-pay | Admitting: Family Medicine

## 2021-03-21 ENCOUNTER — Encounter: Payer: Self-pay | Admitting: Family Medicine

## 2021-03-21 ENCOUNTER — Other Ambulatory Visit: Payer: Self-pay

## 2021-03-21 ENCOUNTER — Ambulatory Visit (INDEPENDENT_AMBULATORY_CARE_PROVIDER_SITE_OTHER): Admitting: Family Medicine

## 2021-03-21 VITALS — BP 124/76 | HR 76 | Temp 98.4°F | Resp 16 | Ht 70.0 in | Wt 286.6 lb

## 2021-03-21 DIAGNOSIS — Z0000 Encounter for general adult medical examination without abnormal findings: Secondary | ICD-10-CM

## 2021-03-21 DIAGNOSIS — Z1231 Encounter for screening mammogram for malignant neoplasm of breast: Secondary | ICD-10-CM

## 2021-03-21 DIAGNOSIS — T7840XA Allergy, unspecified, initial encounter: Secondary | ICD-10-CM

## 2021-03-21 DIAGNOSIS — I10 Essential (primary) hypertension: Secondary | ICD-10-CM | POA: Diagnosis not present

## 2021-03-21 DIAGNOSIS — E11319 Type 2 diabetes mellitus with unspecified diabetic retinopathy without macular edema: Secondary | ICD-10-CM

## 2021-03-21 DIAGNOSIS — D649 Anemia, unspecified: Secondary | ICD-10-CM

## 2021-03-21 DIAGNOSIS — E782 Mixed hyperlipidemia: Secondary | ICD-10-CM

## 2021-03-21 DIAGNOSIS — E669 Obesity, unspecified: Secondary | ICD-10-CM | POA: Diagnosis not present

## 2021-03-21 DIAGNOSIS — E1169 Type 2 diabetes mellitus with other specified complication: Secondary | ICD-10-CM

## 2021-03-21 DIAGNOSIS — Z79899 Other long term (current) drug therapy: Secondary | ICD-10-CM

## 2021-03-21 LAB — CBC WITH DIFFERENTIAL/PLATELET
Basophils Absolute: 0 10*3/uL (ref 0.0–0.1)
Basophils Relative: 0.5 % (ref 0.0–3.0)
Eosinophils Absolute: 0.3 10*3/uL (ref 0.0–0.7)
Eosinophils Relative: 3.5 % (ref 0.0–5.0)
HCT: 36.8 % (ref 36.0–46.0)
Hemoglobin: 12 g/dL (ref 12.0–15.0)
Lymphocytes Relative: 30.9 % (ref 12.0–46.0)
Lymphs Abs: 2.7 10*3/uL (ref 0.7–4.0)
MCHC: 32.6 g/dL (ref 30.0–36.0)
MCV: 90.5 fl (ref 78.0–100.0)
Monocytes Absolute: 0.5 10*3/uL (ref 0.1–1.0)
Monocytes Relative: 5.3 % (ref 3.0–12.0)
Neutro Abs: 5.2 10*3/uL (ref 1.4–7.7)
Neutrophils Relative %: 59.8 % (ref 43.0–77.0)
Platelets: 297 10*3/uL (ref 150.0–400.0)
RBC: 4.07 Mil/uL (ref 3.87–5.11)
RDW: 13.2 % (ref 11.5–15.5)
WBC: 8.8 10*3/uL (ref 4.0–10.5)

## 2021-03-21 LAB — LIPID PANEL
Cholesterol: 102 mg/dL (ref 0–200)
HDL: 20.9 mg/dL — ABNORMAL LOW (ref >39.00)
NonHDL: 81.22
Total CHOL/HDL Ratio: 5
Triglycerides: 343 mg/dL — ABNORMAL HIGH (ref 0.0–149.0)
VLDL: 68.6 mg/dL — ABNORMAL HIGH (ref 0.0–40.0)

## 2021-03-21 LAB — COMPREHENSIVE METABOLIC PANEL
ALT: 16 U/L (ref 0–35)
AST: 16 U/L (ref 0–37)
Albumin: 4.1 g/dL (ref 3.5–5.2)
Alkaline Phosphatase: 103 U/L (ref 39–117)
BUN: 18 mg/dL (ref 6–23)
CO2: 26 mEq/L (ref 19–32)
Calcium: 8.9 mg/dL (ref 8.4–10.5)
Chloride: 103 mEq/L (ref 96–112)
Creatinine, Ser: 0.95 mg/dL (ref 0.40–1.20)
GFR: 69.78 mL/min (ref >60.00)
Glucose, Bld: 114 mg/dL — ABNORMAL HIGH (ref 70–99)
Potassium: 5.2 mEq/L — ABNORMAL HIGH (ref 3.5–5.1)
Sodium: 138 mEq/L (ref 135–145)
Total Bilirubin: 0.6 mg/dL (ref 0.2–1.2)
Total Protein: 6.9 g/dL (ref 6.0–8.3)

## 2021-03-21 LAB — LDL CHOLESTEROL, DIRECT: Direct LDL: 42 mg/dL

## 2021-03-21 LAB — TSH: TSH: 2.11 u[IU]/mL (ref 0.35–4.50)

## 2021-03-21 LAB — HEMOGLOBIN A1C: Hgb A1c MFr Bld: 6.8 % — ABNORMAL HIGH (ref 4.6–6.5)

## 2021-03-21 NOTE — Assessment & Plan Note (Addendum)
Is overdue and agrees to return for next appt

## 2021-03-21 NOTE — Assessment & Plan Note (Signed)
Tolerating statin, encouraged heart healthy diet, avoid trans fats, minimize simple carbs and saturated fats. Increase exercise as tolerated 

## 2021-03-21 NOTE — Assessment & Plan Note (Signed)
Encouraged MIND diet, decrease po intake and increase exercise as tolerated. Needs 7-8 hours of sleep nightly. Avoid trans fats, eat small, frequent meals every 4-5 hours with lean proteins, complex carbs and healthy fats. Minimize simple carbs 

## 2021-03-21 NOTE — Assessment & Plan Note (Signed)
Well controlled, no changes to meds. Encouraged heart healthy diet such as the DASH diet and exercise as tolerated.  °

## 2021-03-21 NOTE — Patient Instructions (Signed)
Shingrix is the new shingles shot 2 shots over 2-6 months. Confirm coverage with insurance and document, can get it at pharmacy or at office with nurse visit.  Preventive Care 43-51 Years Old, Female Preventive care refers to lifestyle choices and visits with your health care provider that can promote health and wellness. This includes:  A yearly physical exam. This is also called an annual wellness visit.  Regular dental and eye exams.  Immunizations.  Screening for certain conditions.  Healthy lifestyle choices, such as: ? Eating a healthy diet. ? Getting regular exercise. ? Not using drugs or products that contain nicotine and tobacco. ? Limiting alcohol use. What can I expect for my preventive care visit? Physical exam Your health care provider will check your:  Height and weight. These may be used to calculate your BMI (body mass index). BMI is a measurement that tells if you are at a healthy weight.  Heart rate and blood pressure.  Body temperature.  Skin for abnormal spots. Counseling Your health care provider may ask you questions about your:  Past medical problems.  Family's medical history.  Alcohol, tobacco, and drug use.  Emotional well-being.  Home life and relationship well-being.  Sexual activity.  Diet, exercise, and sleep habits.  Work and work Statistician.  Access to firearms.  Method of birth control.  Menstrual cycle.  Pregnancy history. What immunizations do I need? Vaccines are usually given at various ages, according to a schedule. Your health care provider will recommend vaccines for you based on your age, medical history, and lifestyle or other factors, such as travel or where you work.   What tests do I need? Blood tests  Lipid and cholesterol levels. These may be checked every 5 years, or more often if you are over 59 years old.  Hepatitis C test.  Hepatitis B test. Screening  Lung cancer screening. You may have this  screening every year starting at age 66 if you have a 30-pack-year history of smoking and currently smoke or have quit within the past 15 years.  Colorectal cancer screening. ? All adults should have this screening starting at age 50 and continuing until age 68. ? Your health care provider may recommend screening at age 61 if you are at increased risk. ? You will have tests every 1-10 years, depending on your results and the type of screening test.  Diabetes screening. ? This is done by checking your blood sugar (glucose) after you have not eaten for a while (fasting). ? You may have this done every 1-3 years.  Mammogram. ? This may be done every 1-2 years. ? Talk with your health care provider about when you should start having regular mammograms. This may depend on whether you have a family history of breast cancer.  BRCA-related cancer screening. This may be done if you have a family history of breast, ovarian, tubal, or peritoneal cancers.  Pelvic exam and Pap test. ? This may be done every 3 years starting at age 29. ? Starting at age 58, this may be done every 5 years if you have a Pap test in combination with an HPV test. Other tests  STD (sexually transmitted disease) testing, if you are at risk.  Bone density scan. This is done to screen for osteoporosis. You may have this scan if you are at high risk for osteoporosis. Talk with your health care provider about your test results, treatment options, and if necessary, the need for more tests. Follow these instructions  at home: Eating and drinking  Eat a diet that includes fresh fruits and vegetables, whole grains, lean protein, and low-fat dairy products.  Take vitamin and mineral supplements as recommended by your health care provider.  Do not drink alcohol if: ? Your health care provider tells you not to drink. ? You are pregnant, may be pregnant, or are planning to become pregnant.  If you drink alcohol: ? Limit how  much you have to 0-1 drink a day. ? Be aware of how much alcohol is in your drink. In the [place] one drink equals one 12 oz bottle of beer (355 mL), one 5 oz glass of wine (148 mL), or one 1 oz glass of hard liquor (44 mL).   Lifestyle  Take daily care of your teeth and gums. Brush your teeth every morning and night with fluoride toothpaste. Floss one time each day.  Stay active. Exercise for at least 30 minutes 5 or more days each week.  Do not use any products that contain nicotine or tobacco, such as cigarettes, e-cigarettes, and chewing tobacco. If you need help quitting, ask your health care provider.  Do not use drugs.  If you are sexually active, practice safe sex. Use a condom or other form of protection to prevent STIs (sexually transmitted infections).  If you do not wish to become pregnant, use a form of birth control. If you plan to become pregnant, see your health care provider for a prepregnancy visit.  If told by your health care provider, take low-dose aspirin daily starting at age 62.  Find healthy ways to cope with stress, such as: ? Meditation, yoga, or listening to music. ? Journaling. ? Talking to a trusted person. ? Spending time with friends and family. Safety  Always wear your seat belt while driving or riding in a vehicle.  Do not drive: ? If you have been drinking alcohol. Do not ride with someone who has been drinking. ? When you are tired or distracted. ? While texting.  Wear a helmet and other protective equipment during sports activities.  If you have firearms in your house, make sure you follow all gun safety procedures. What's next?  Visit your health care provider once a year for an annual wellness visit.  Ask your health care provider how often you should have your eyes and teeth checked.  Stay up to date on all vaccines. This information is not intended to replace advice given to you by your health care provider. Make sure you discuss any  questions you have with your health care provider. Document Revised: 09/18/2020 Document Reviewed: 08/26/2018 Elsevier Patient Education  2021 [employer].

## 2021-03-21 NOTE — Assessment & Plan Note (Signed)
Mild flare with PND and slight cough in am. She will notify us if worsens and can use OTC meds for now

## 2021-03-21 NOTE — Assessment & Plan Note (Signed)
hgba1c acceptable, minimize simple carbs. Increase exercise as tolerated. Continue current meds 

## 2021-03-21 NOTE — Progress Notes (Signed)
Patient ID: April Warren, female    DOB: 1971-05-16  Age: 50 y.o. MRN: [id number]    Subjective:  Subjective  HPI April Warren presents for comprehensive physical exam visit today and follow up on management of chronic concerns. She expresses that her recent glucose readings were 211-255 (250 mostly when fasting). She states it started a couple of months ago when she had her newborn.  Lab Results  Component Value Date   HGBA1C 6.8 (H) 03/21/2021    She endorses having a blood blister on her left foot medial localized to her big toe. She also fell  recently because she was playing with her kids, but no injury or trauma occurred. She denies any chest pain, SOB, fever, abdominal pain, cough, chills, sore throat, dysuria, urinary incontinence, back pain, HA, or N/VD.   She endorses getting chicken pox in her past and expresses interest in getting the shingles vaccine. She is not considering getting the COVID-19 vaccine today. She endorses wanting to get a mammo and  Colonoscopy today. She expresses interest in visiting an optho/optometrist, but at the moment cannot due to her insurance not covering it.   Review of Systems  Constitutional: Negative for chills, fatigue and fever.  HENT: Negative for congestion, rhinorrhea, sinus pressure, sinus pain and sore throat.   Eyes: Negative for pain.  Respiratory: Negative for cough and shortness of breath.   Cardiovascular: Negative for chest pain, palpitations and leg swelling.  Gastrointestinal: Negative for blood in stool, diarrhea, nausea and vomiting.  Genitourinary: Negative for flank pain, frequency, vaginal bleeding, vaginal discharge and vaginal pain.  Musculoskeletal: Negative for back pain.  Skin:       (+)left toe blister  Neurological: Negative for headaches.    History Past Medical History:  Diagnosis Date  . Allergy   . Chicken pox 50 yrs old  . Diabetes mellitus 08-12-12   type 2- dr Zigmund Daniel diagnosed  . Diabetic retinopathy  ([place])   . Dysmenorrhea 08/20/2012  . HTN (hypertension) 08/20/2012  . Hyperlipidemia, mixed 08/28/2013  . Neuropathy 02/16/2017  . Obesity   . Obesity, unspecified 08/28/2013  . Other and unspecified hyperlipidemia 08/28/2013  . Preventative health care 08/20/2012  . Sinusitis 11/14/2013  . Sinusitis, acute 05/02/2016    She has a past surgical history that includes Refractive surgery (08-12-12).   Her family history includes Asthma in her maternal grandmother; COPD in her father and maternal grandmother; Cancer in her brother; Cancer (age of onset: 65) in her mother; Diabetes in her mother; Gout in her brother; Heart attack in her maternal grandfather; Heart disease in her paternal grandfather; Hyperlipidemia in her father and maternal grandmother; Hypertension in her father, maternal grandfather, maternal grandmother, and mother.She reports that she has never smoked. She has never used smokeless tobacco. She reports current alcohol use. She reports that she does not use drugs.  Current Outpatient Medications on File Prior to Visit  Medication Sig Dispense Refill  . atorvastatin (LIPITOR) 80 MG tablet TAKE 1 TABLET DAILY 90 tablet 0  . Blood Glucose Monitoring Suppl (FREESTYLE FREEDOM LITE) w/Device KIT Check blood sugar twice daily 1 each 0  . cetirizine (ZYRTEC) 10 MG tablet Take 1 tablet (10 mg total) by mouth daily as needed for allergies. 90 tablet 1  . Dulaglutide (TRULICITY) 6.33 HL/4.5GY SOPN Inject 0.75 mg into the skin once a week. 6 mL 3  . gabapentin (NEURONTIN) 300 MG capsule Take 1 capsule twice daily and 2 capsules at bedtime 360 capsule 1  .  glipiZIDE (GLUCOTROL) 10 MG tablet Take 1 tablet (10 mg total) by mouth 2 (two) times daily before a meal. 180 tablet 1  . glucose blood test strip Use as instructed 100 each 12  . ibuprofen (ADVIL,MOTRIN) 200 MG tablet Take 400 mg by mouth every 8 (eight) hours as needed.    Marland Kitchen JANUVIA 100 MG tablet TAKE 1 TABLET DAILY 90 tablet 3  . Lancets  (FREESTYLE) lancets Check blood sugar twice daily 200 each 5  . lisinopril (ZESTRIL) 20 MG tablet Take 1 tablet (20 mg total) by mouth 2 (two) times daily. 180 tablet 1  . LORazepam (ATIVAN) 0.5 MG tablet TAKE 1 TABLET TWICE A DAY AS NEEDED FOR ANXIETY OR SLEEP 120 tablet 1  . metFORMIN (GLUCOPHAGE-XR) 750 MG 24 hr tablet Take 1 tablet (750 mg total) by mouth 3 (three) times daily. 270 tablet 1  . metoprolol tartrate (LOPRESSOR) 50 MG tablet Take 1 tablet (50 mg total) by mouth 2 (two) times daily. 180 tablet 1  . montelukast (SINGULAIR) 10 MG tablet TAKE 1 TABLET AT BEDTIME (NEED FURTHER EVALUATION AND/OR LAB TESTING BEFORE FURTHER REFILLS ARE GIVEN, MAKE AN APPOINTMENT) 90 tablet 3  . PROAIR HFA 108 (90 Base) MCG/ACT inhaler USE 2 INHALATIONS EVERY 6 HOURS AS NEEDED FOR WHEEZING OR SHORTNESS OF BREATH 8.5 g 13  . triamcinolone (NASACORT) 55 MCG/ACT AERO nasal inhaler Place 2 sprays into the nose daily as needed. 1 each 5  . triamterene-hydrochlorothiazide (MAXZIDE-25) 37.5-25 MG tablet TAKE 1 TABLET DAILY 90 tablet 3  . Vitamin D, Ergocalciferol, (DRISDOL) 1.25 MG (50000 UNIT) CAPS capsule TAKE 1 CAPSULE EVERY 7 DAYS 16 capsule 3   No current facility-administered medications on file prior to visit.     Objective:  Objective  Physical Exam Constitutional:      General: She is not in acute distress.    Appearance: Normal appearance. She is well-developed. She is not ill-appearing or diaphoretic.  HENT:     Head: Normocephalic and atraumatic.     Right Ear: Tympanic membrane, ear canal and external ear normal. There is no impacted cerumen.     Left Ear: Tympanic membrane, ear canal and external ear normal. There is no impacted cerumen.     Nose: Nose normal.  Eyes:     Extraocular Movements: Extraocular movements intact.     Right eye: No nystagmus.     Left eye: No nystagmus.     Conjunctiva/sclera: Conjunctivae normal.     Pupils: Pupils are equal, round, and reactive to light.   Neck:     Thyroid: No thyromegaly.     Vascular: No JVD.  Cardiovascular:     Rate and Rhythm: Normal rate and regular rhythm.     Pulses: Normal pulses.     Heart sounds: Normal heart sounds. No murmur heard.   Pulmonary:     Effort: Pulmonary effort is normal. No respiratory distress.     Breath sounds: Normal breath sounds. No wheezing, rhonchi or rales.  Chest:     Chest wall: No tenderness.  Abdominal:     General: Bowel sounds are normal. There is no distension.     Palpations: Abdomen is soft. There is no mass.     Tenderness: There is no abdominal tenderness. There is no guarding or rebound.     Hernia: No hernia is present.  Genitourinary:    Vagina: Normal.  Musculoskeletal:        General: No tenderness. Normal range of motion.  Cervical back: Normal range of motion and neck supple. No rigidity or tenderness.     Right lower leg: 1+ Edema present.     Left lower leg: 1+ Edema present.  Feet:     Right foot:     Skin integrity: Skin integrity normal.     Toenail Condition: Right toenails are normal.     Left foot:     Skin integrity: Blister (Blood) present.     Toenail Condition: Left toenails are normal.  Skin:    General: Skin is warm and dry.  Neurological:     Mental Status: She is alert and oriented to person, place, and time. Mental status is at baseline.     Deep Tendon Reflexes: Reflexes are normal and symmetric.  Psychiatric:        Behavior: Behavior normal.    BP 124/76   Pulse 76   Temp 98.4 F (36.9 C)   Resp 16   Ht 5' 10"  (1.778 m)   Wt 286 lb 9.6 oz (130 kg)   SpO2 94%   BMI 41.12 kg/m  Wt Readings from Last 3 Encounters:  03/21/21 286 lb 9.6 oz (130 kg)  09/11/20 284 lb 6.4 oz (129 kg)  04/16/20 291 lb (132 kg)     Lab Results  Component Value Date   WBC 8.8 03/21/2021   HGB 12.0 03/21/2021   HCT 36.8 03/21/2021   PLT 297.0 03/21/2021   GLUCOSE 114 (H) 03/21/2021   CHOL 102 03/21/2021   TRIG 343.0 (H) 03/21/2021    HDL 20.90 (L) 03/21/2021   LDLDIRECT 42.0 03/21/2021   LDLCALC 39 09/11/2020   ALT 16 03/21/2021   AST 16 03/21/2021   NA 138 03/21/2021   K 5.2 No hemolysis seen (H) 03/21/2021   CL 103 03/21/2021   CREATININE 0.95 03/21/2021   BUN 18 03/21/2021   CO2 26 03/21/2021   TSH 2.11 03/21/2021   INR 1.03 03/04/2011   HGBA1C 6.8 (H) 03/21/2021   MICROALBUR 19.6 (H) 01/29/2016    No results found.   Assessment & Plan:  Plan    No orders of the defined types were placed in this encounter.   Problem List Items Addressed This Visit    Diabetes mellitus type 2 in obese ([place]) - Primary    hgba1c acceptable, minimize simple carbs. Increase exercise as tolerated. Continue current meds      Relevant Orders   Hemoglobin A1c (Completed)   Diabetic retinopathy ([place])    Is overdue and agrees to return for next appt      Obesity    Encouraged MIND diet, decrease po intake and increase exercise as tolerated. Needs 7-8 hours of sleep nightly. Avoid trans fats, eat small, frequent meals every 4-5 hours with lean proteins, complex carbs and healthy fats. Minimize simple carbs.       HTN (hypertension)    Well controlled, no changes to meds. Encouraged heart healthy diet such as the DASH diet and exercise as tolerated.       Allergy    Mild flare with PND and slight cough in am. She will notify us if worsens and can use OTC meds for now      Preventative health care    Patient encouraged to maintain heart healthy diet, regular exercise, adequate sleep. Consider daily probiotics. Take medications as prescribed.  Labs reviewed and monitored. MGM is due. Will refer for colonoscopy for screening. Declines all immunizations> MGM ordered  Relevant Orders   Hemoglobin A1c (Completed)   CBC with Differential/Platelet (Completed)   Comprehensive metabolic panel (Completed)   Lipid panel (Completed)   TSH (Completed)   Hyperlipidemia, mixed    Tolerating statin, encouraged heart healthy  diet, avoid trans fats, minimize simple carbs and saturated fats. Increase exercise as tolerated      Relevant Orders   CBC with Differential/Platelet (Completed)   Comprehensive metabolic panel (Completed)   Lipid panel (Completed)   Breast cancer screening    MGM ordered      Relevant Orders   MM 3D SCREEN BREAST BILATERAL    Other Visit Diagnoses    Mild anemia       Relevant Orders   CBC with Differential/Platelet (Completed)   Comprehensive metabolic panel (Completed)   Lipid panel (Completed)   High risk medication use       Relevant Orders   DRUG MONITORING, PANEL 8 WITH CONFIRMATION, URINE      Mammo: Last completed on 02/12/2018, results were normal, repeat every year.  Pap Smear: Last completed on 08/19/2018, results were negative for intraepithelial lesions or malignancy, repeat every 3 years.  Follow-up: Return in about 3 months (around 06/21/2021).  I,David Hanna,acting as a scribe for Penni Homans, MD.,have documented all relevant documentation on the behalf of Penni Homans, MD,as directed by  Penni Homans, MD while in the presence of Penni Homans, MD.  I, Mosie Lukes, MD personally performed the services described in this documentation. All medical record entries made by the scribe were at my direction and in my presence. I have reviewed the chart and agree that the record reflects my personal performance and is accurate and complete

## 2021-03-21 NOTE — Assessment & Plan Note (Addendum)
Patient encouraged to maintain heart healthy diet, regular exercise, adequate sleep. Consider daily probiotics. Take medications as prescribed.  Labs reviewed and monitored. MGM is due. Will refer for colonoscopy for screening. Declines all immunizations> MGM ordered

## 2021-03-21 NOTE — Assessment & Plan Note (Signed)
MGM ordered 

## 2021-03-22 ENCOUNTER — Other Ambulatory Visit: Payer: Self-pay

## 2021-03-22 DIAGNOSIS — E782 Mixed hyperlipidemia: Secondary | ICD-10-CM

## 2021-03-22 DIAGNOSIS — E1169 Type 2 diabetes mellitus with other specified complication: Secondary | ICD-10-CM

## 2021-03-23 LAB — DRUG MONITORING, PANEL 8 WITH CONFIRMATION, URINE
6 Acetylmorphine: NEGATIVE ng/mL (ref <10)
Alcohol Metabolites: NEGATIVE ng/mL
Alphahydroxyalprazolam: NEGATIVE ng/mL (ref <25)
Alphahydroxymidazolam: NEGATIVE ng/mL (ref <50)
Alphahydroxytriazolam: NEGATIVE ng/mL (ref <50)
Aminoclonazepam: NEGATIVE ng/mL (ref <25)
Amphetamines: NEGATIVE ng/mL (ref <500)
Benzodiazepines: POSITIVE ng/mL — AB (ref <100)
Buprenorphine, Urine: NEGATIVE ng/mL (ref <5)
Cocaine Metabolite: NEGATIVE ng/mL (ref <150)
Creatinine: 51 mg/dL
Hydroxyethylflurazepam: NEGATIVE ng/mL (ref <50)
Lorazepam: 218 ng/mL — ABNORMAL HIGH (ref <50)
MDMA: NEGATIVE ng/mL (ref <500)
Marijuana Metabolite: NEGATIVE ng/mL (ref <20)
Nordiazepam: NEGATIVE ng/mL (ref <50)
Opiates: NEGATIVE ng/mL (ref <100)
Oxazepam: NEGATIVE ng/mL (ref <50)
Oxidant: NEGATIVE ug/mL
Oxycodone: NEGATIVE ng/mL (ref <100)
Temazepam: NEGATIVE ng/mL (ref <50)
pH: 5.5 (ref 4.5–9.0)

## 2021-03-23 LAB — DM TEMPLATE

## 2021-04-05 ENCOUNTER — Other Ambulatory Visit: Payer: Self-pay | Admitting: Family Medicine

## 2021-04-05 DIAGNOSIS — F419 Anxiety disorder, unspecified: Secondary | ICD-10-CM

## 2021-04-05 DIAGNOSIS — T7840XD Allergy, unspecified, subsequent encounter: Secondary | ICD-10-CM

## 2021-04-05 DIAGNOSIS — E1169 Type 2 diabetes mellitus with other specified complication: Secondary | ICD-10-CM

## 2021-04-05 NOTE — Telephone Encounter (Signed)
Requesting: lorazepam Contract: 03/21/21 UDS: 03/21/21 Last Visit: 03/21/21 Next Visit: 08/12/21 Last Refill: 11/14/20  Please Advise

## 2021-04-25 ENCOUNTER — Other Ambulatory Visit: Payer: Self-pay | Admitting: Internal Medicine

## 2021-06-26 ENCOUNTER — Other Ambulatory Visit: Payer: Self-pay

## 2021-06-26 ENCOUNTER — Other Ambulatory Visit (INDEPENDENT_AMBULATORY_CARE_PROVIDER_SITE_OTHER)

## 2021-06-26 DIAGNOSIS — E1169 Type 2 diabetes mellitus with other specified complication: Secondary | ICD-10-CM | POA: Diagnosis not present

## 2021-06-26 DIAGNOSIS — D649 Anemia, unspecified: Secondary | ICD-10-CM

## 2021-06-26 DIAGNOSIS — E782 Mixed hyperlipidemia: Secondary | ICD-10-CM

## 2021-06-26 DIAGNOSIS — E669 Obesity, unspecified: Secondary | ICD-10-CM

## 2021-06-26 LAB — CBC WITH DIFFERENTIAL/PLATELET
Basophils Absolute: 0.1 10*3/uL (ref 0.0–0.1)
Basophils Relative: 0.5 % (ref 0.0–3.0)
Eosinophils Absolute: 0.2 10*3/uL (ref 0.0–0.7)
Eosinophils Relative: 2.5 % (ref 0.0–5.0)
HCT: 34.1 % — ABNORMAL LOW (ref 36.0–46.0)
Hemoglobin: 11.4 g/dL — ABNORMAL LOW (ref 12.0–15.0)
Lymphocytes Relative: 30.6 % (ref 12.0–46.0)
Lymphs Abs: 3 10*3/uL (ref 0.7–4.0)
MCHC: 33.5 g/dL (ref 30.0–36.0)
MCV: 88.6 fl (ref 78.0–100.0)
Monocytes Absolute: 0.7 10*3/uL (ref 0.1–1.0)
Monocytes Relative: 6.7 % (ref 3.0–12.0)
Neutro Abs: 5.8 10*3/uL (ref 1.4–7.7)
Neutrophils Relative %: 59.7 % (ref 43.0–77.0)
Platelets: 305 10*3/uL (ref 150.0–400.0)
RBC: 3.85 Mil/uL — ABNORMAL LOW (ref 3.87–5.11)
RDW: 14 % (ref 11.5–15.5)
WBC: 9.7 10*3/uL (ref 4.0–10.5)

## 2021-06-26 LAB — COMPREHENSIVE METABOLIC PANEL
ALT: 12 U/L (ref 0–35)
AST: 10 U/L (ref 0–37)
Albumin: 3.9 g/dL (ref 3.5–5.2)
Alkaline Phosphatase: 109 U/L (ref 39–117)
BUN: 15 mg/dL (ref 6–23)
CO2: 28 mEq/L (ref 19–32)
Calcium: 8.8 mg/dL (ref 8.4–10.5)
Chloride: 103 mEq/L (ref 96–112)
Creatinine, Ser: 0.83 mg/dL (ref 0.40–1.20)
GFR: 81.91 mL/min (ref >60.00)
Glucose, Bld: 105 mg/dL — ABNORMAL HIGH (ref 70–99)
Potassium: 4.4 mEq/L (ref 3.5–5.1)
Sodium: 139 mEq/L (ref 135–145)
Total Bilirubin: 0.6 mg/dL (ref 0.2–1.2)
Total Protein: 6.5 g/dL (ref 6.0–8.3)

## 2021-06-26 LAB — LIPID PANEL
Cholesterol: 91 mg/dL (ref 0–200)
HDL: 20.9 mg/dL — ABNORMAL LOW (ref >39.00)
NonHDL: 69.88
Total CHOL/HDL Ratio: 4
Triglycerides: 201 mg/dL — ABNORMAL HIGH (ref 0.0–149.0)
VLDL: 40.2 mg/dL — ABNORMAL HIGH (ref 0.0–40.0)

## 2021-06-26 LAB — HEMOGLOBIN A1C: Hgb A1c MFr Bld: 6.9 % — ABNORMAL HIGH (ref 4.6–6.5)

## 2021-06-26 LAB — LDL CHOLESTEROL, DIRECT: Direct LDL: 46 mg/dL

## 2021-06-28 ENCOUNTER — Other Ambulatory Visit: Payer: Self-pay

## 2021-06-28 DIAGNOSIS — D649 Anemia, unspecified: Secondary | ICD-10-CM

## 2021-06-28 NOTE — Addendum Note (Signed)
Addended by: Calton Golds A on: 06/28/2021 11:23 AM   Modules accepted: Orders

## 2021-07-29 ENCOUNTER — Other Ambulatory Visit (INDEPENDENT_AMBULATORY_CARE_PROVIDER_SITE_OTHER)

## 2021-07-29 ENCOUNTER — Other Ambulatory Visit: Payer: Self-pay

## 2021-07-29 DIAGNOSIS — D649 Anemia, unspecified: Secondary | ICD-10-CM

## 2021-07-29 LAB — CBC WITH DIFFERENTIAL/PLATELET
Basophils Absolute: 0.1 10*3/uL (ref 0.0–0.1)
Basophils Relative: 0.7 % (ref 0.0–3.0)
Eosinophils Absolute: 0.2 10*3/uL (ref 0.0–0.7)
Eosinophils Relative: 2.8 % (ref 0.0–5.0)
HCT: 36.1 % (ref 36.0–46.0)
Hemoglobin: 11.9 g/dL — ABNORMAL LOW (ref 12.0–15.0)
Lymphocytes Relative: 30.5 % (ref 12.0–46.0)
Lymphs Abs: 2.2 10*3/uL (ref 0.7–4.0)
MCHC: 33 g/dL (ref 30.0–36.0)
MCV: 89.1 fl (ref 78.0–100.0)
Monocytes Absolute: 0.4 10*3/uL (ref 0.1–1.0)
Monocytes Relative: 5.4 % (ref 3.0–12.0)
Neutro Abs: 4.5 10*3/uL (ref 1.4–7.7)
Neutrophils Relative %: 60.6 % (ref 43.0–77.0)
Platelets: 285 10*3/uL (ref 150.0–400.0)
RBC: 4.05 Mil/uL (ref 3.87–5.11)
RDW: 13.9 % (ref 11.5–15.5)
WBC: 7.3 10*3/uL (ref 4.0–10.5)

## 2021-07-30 LAB — IRON,TIBC AND FERRITIN PANEL
%SAT: 16 % (calc) (ref 16–45)
Ferritin: 6 ng/mL — ABNORMAL LOW (ref 16–232)
Iron: 62 ug/dL (ref 45–160)
TIBC: 384 mcg/dL (calc) (ref 250–450)

## 2021-07-31 ENCOUNTER — Other Ambulatory Visit: Payer: Self-pay | Admitting: Family Medicine

## 2021-07-31 ENCOUNTER — Other Ambulatory Visit (INDEPENDENT_AMBULATORY_CARE_PROVIDER_SITE_OTHER)

## 2021-07-31 DIAGNOSIS — F419 Anxiety disorder, unspecified: Secondary | ICD-10-CM

## 2021-07-31 DIAGNOSIS — D649 Anemia, unspecified: Secondary | ICD-10-CM

## 2021-07-31 LAB — FECAL OCCULT BLOOD, IMMUNOCHEMICAL: Fecal Occult Bld: NEGATIVE

## 2021-08-01 NOTE — Telephone Encounter (Signed)
Requesting: lorazepam Contract: 03/21/21 UDS: 03/21/21 Last Visit: 03/21/21 Next Visit: 08/12/21 Last Refill:   Please Advise

## 2021-08-12 ENCOUNTER — Ambulatory Visit: Admitting: Family Medicine

## 2021-08-12 ENCOUNTER — Other Ambulatory Visit: Payer: Self-pay | Admitting: Family Medicine

## 2021-08-12 DIAGNOSIS — E1169 Type 2 diabetes mellitus with other specified complication: Secondary | ICD-10-CM

## 2021-08-12 DIAGNOSIS — E669 Obesity, unspecified: Secondary | ICD-10-CM

## 2021-08-12 DIAGNOSIS — I10 Essential (primary) hypertension: Secondary | ICD-10-CM

## 2021-08-20 ENCOUNTER — Encounter: Payer: Self-pay | Admitting: Family Medicine

## 2021-08-20 ENCOUNTER — Other Ambulatory Visit: Payer: Self-pay

## 2021-08-20 ENCOUNTER — Ambulatory Visit (INDEPENDENT_AMBULATORY_CARE_PROVIDER_SITE_OTHER): Admitting: Family Medicine

## 2021-08-20 VITALS — BP 122/72 | HR 75 | Temp 97.9°F | Resp 16 | Wt 286.2 lb

## 2021-08-20 DIAGNOSIS — E1169 Type 2 diabetes mellitus with other specified complication: Secondary | ICD-10-CM | POA: Diagnosis not present

## 2021-08-20 DIAGNOSIS — Z1211 Encounter for screening for malignant neoplasm of colon: Secondary | ICD-10-CM

## 2021-08-20 DIAGNOSIS — Z1231 Encounter for screening mammogram for malignant neoplasm of breast: Secondary | ICD-10-CM

## 2021-08-20 DIAGNOSIS — E11319 Type 2 diabetes mellitus with unspecified diabetic retinopathy without macular edema: Secondary | ICD-10-CM

## 2021-08-20 DIAGNOSIS — Z124 Encounter for screening for malignant neoplasm of cervix: Secondary | ICD-10-CM | POA: Diagnosis not present

## 2021-08-20 DIAGNOSIS — E782 Mixed hyperlipidemia: Secondary | ICD-10-CM

## 2021-08-20 DIAGNOSIS — E669 Obesity, unspecified: Secondary | ICD-10-CM

## 2021-08-20 DIAGNOSIS — I10 Essential (primary) hypertension: Secondary | ICD-10-CM

## 2021-08-20 DIAGNOSIS — Z0000 Encounter for general adult medical examination without abnormal findings: Secondary | ICD-10-CM

## 2021-08-20 NOTE — Assessment & Plan Note (Signed)
Encouraged DASH or MIND diet, decrease po intake and increase exercise as tolerated. Needs 7-8 hours of sleep nightly. Avoid trans fats, eat small, frequent meals every 4-5 hours with lean proteins, complex carbs and healthy fats. Minimize simple carbs, high fat foods and processed foods. She will check with insurance to confirm coverage of Wegovy or Saxenda and let us know

## 2021-08-20 NOTE — Assessment & Plan Note (Signed)
Well controlled, no changes to meds. Encouraged heart healthy diet such as the DASH diet and exercise as tolerated.  °

## 2021-08-20 NOTE — Assessment & Plan Note (Signed)
MGM ordered today, patient without concerns

## 2021-08-20 NOTE — Assessment & Plan Note (Signed)
She declines COVID shots but agrees to proceed with Shingrix shots. She will check with insurance to confirm coverage and proceed

## 2021-08-20 NOTE — Assessment & Plan Note (Signed)
hgba1c acceptable, minimize simple carbs. Increase exercise as tolerated. Continue current meds. She is following with endocrinology

## 2021-08-20 NOTE — Assessment & Plan Note (Signed)
She agrees to call her opthamologist and schedule appt.

## 2021-08-20 NOTE — Assessment & Plan Note (Signed)
She is referred to GYN for well woman care. Her pap is due next month and she is worried about hormonal dysfunction.

## 2021-08-20 NOTE — Patient Instructions (Addendum)
Encouraged increased hydration and fiber in diet. Daily probiotics. If bowels not moving can use MOM 2 tbls po in 4 oz of warm prune juice by mouth every 2-3 days. If no results then repeat in 4 hours with  Dulcolax suppository pr, may repeat again in 4 more hours as needed. Seek care if symptoms worsen. Consider daily Miralax and/or Dulcolax if symptoms persist.    Miralax with Benefiber together daily for chronic constipation   Make an appointment with your eye doctor and have them send a note to [place]  Shingrix is the new shingles shot, 2 shots over 2-6 months, confirm coverage with insurance and document, then can return here for shots with nurse appt or at pharmacy    Paxlovid is the new COVID medication we can give you if you get COVID so make sure you test if you have symptoms because we have to treat by day 5 of symptoms for it to be effective. If you are positive let us know so we can treat. If a home test is negative and your symptoms are persistent get a PCR test. Can check testing locations at [email or web address] If you are positive we will make an appointment with [place] and we will send in Paxlovid if you would like it. Check with your pharmacy before we meet to confirm they have it in stock, if they do not then we can get the prescription at the [facility] and Bernie Covey are new weight loss meds let us know if your insurance covers, we would then need a 3 month appt if we start the meds

## 2021-08-20 NOTE — Addendum Note (Signed)
Addended by: Calton Golds A on: 08/20/2021 10:42 AM   Modules accepted: Orders

## 2021-08-20 NOTE — Assessment & Plan Note (Signed)
Tolerating statin, encouraged heart healthy diet, avoid trans fats, minimize simple carbs and saturated fats. Increase exercise as tolerated 

## 2021-08-20 NOTE — Progress Notes (Signed)
Patient ID: April Warren, female    DOB: 1971-03-07  Age: 50 y.o. MRN: [id number]    Subjective:  Subjective  HPI April Warren presents for office visit today for follow up on hypertension and Hyperlipidemia. She reports experiencing constipation with bloating a week ago, but that was after she had quit taking her probiotic. Once she restarted it, her constipation has subsided. Denies CP/palp/SOB/HA/congestion/fevers/GI or GU c/o. Taking meds as prescribed. She states that she struggles with neuropathy in bilateral feet. She states that it hurts all the time.   Review of Systems  Constitutional:  Negative for chills, fatigue and fever.  HENT:  Negative for congestion, rhinorrhea, sinus pressure, sinus pain and sore throat.   Eyes:  Negative for pain.  Respiratory:  Negative for cough and shortness of breath.   Cardiovascular:  Negative for chest pain, palpitations and leg swelling.  Gastrointestinal:  Negative for abdominal pain, blood in stool, diarrhea, nausea and vomiting.  Genitourinary:  Negative for decreased urine volume, flank pain, frequency, vaginal bleeding and vaginal discharge.  Musculoskeletal:  Negative for back pain.  Neurological:  Negative for headaches.   History Past Medical History:  Diagnosis Date   Allergy    Chicken pox 50 yrs old   Diabetes mellitus 08-12-12   type 2- dr Zigmund Daniel diagnosed   Diabetic retinopathy ([place])    Dysmenorrhea 08/20/2012   HTN (hypertension) 08/20/2012   Hyperlipidemia, mixed 08/28/2013   Neuropathy 02/16/2017   Obesity    Obesity, unspecified 08/28/2013   Other and unspecified hyperlipidemia 08/28/2013   Preventative health care 08/20/2012   Sinusitis 11/14/2013   Sinusitis, acute 05/02/2016    She has a past surgical history that includes Refractive surgery (08-12-12).   Her family history includes Asthma in her maternal grandmother; COPD in her father and maternal grandmother; Cancer in her brother; Cancer (age of onset: 71) in her  mother; Diabetes in her mother; Gout in her brother; Heart attack in her maternal grandfather; Heart disease in her paternal grandfather; Hyperlipidemia in her father and maternal grandmother; Hypertension in her father, maternal grandfather, maternal grandmother, and mother.She reports that she has never smoked. She has never used smokeless tobacco. She reports current alcohol use. She reports that she does not use drugs.  Current Outpatient Medications on File Prior to Visit  Medication Sig Dispense Refill   atorvastatin (LIPITOR) 80 MG tablet Take 1 tablet (80 mg total) by mouth daily. 90 tablet 1   Blood Glucose Monitoring Suppl (FREESTYLE FREEDOM LITE) w/Device KIT Check blood sugar twice daily 1 each 0   cetirizine (ZYRTEC) 10 MG tablet TAKE 1 TABLET DAILY AS NEEDED FOR ALLERGIES 90 tablet 3   Dulaglutide (TRULICITY) 3.41 DQ/2.2WL SOPN Inject 0.75 mg into the skin once a week. 6 mL 3   gabapentin (NEURONTIN) 300 MG capsule TAKE 1 CAPSULE TWICE A DAY AND 2 CAPSULES AT BEDTIME 360 capsule 3   glipiZIDE (GLUCOTROL) 10 MG tablet TAKE 1 TABLET TWICE A DAY BEFORE MEALS 180 tablet 3   glucose blood test strip Use as instructed 100 each 12   ibuprofen (ADVIL,MOTRIN) 200 MG tablet Take 400 mg by mouth every 8 (eight) hours as needed.     JANUVIA 100 MG tablet TAKE 1 TABLET DAILY 90 tablet 3   Lancets (FREESTYLE) lancets Check blood sugar twice daily 200 each 5   lisinopril (ZESTRIL) 20 MG tablet TAKE 1 TABLET TWICE A DAY 180 tablet 3   LORazepam (ATIVAN) 0.5 MG tablet TAKE 1 TABLET TWICE A  DAY AS NEEDED FOR ANXIETY OR SLEEP 120 tablet 1   metFORMIN (GLUCOPHAGE-XR) 750 MG 24 hr tablet TAKE 1 TABLET THREE TIMES A DAY 270 tablet 3   metoprolol tartrate (LOPRESSOR) 50 MG tablet TAKE 1 TABLET TWICE A DAY 180 tablet 3   montelukast (SINGULAIR) 10 MG tablet TAKE 1 TABLET AT BEDTIME (NEED FURTHER EVALUATION AND/OR LAB TESTING BEFORE FURTHER REFILLS ARE GIVEN, MAKE AN APPOINTMENT) 90 tablet 3   PROAIR HFA 108  (90 Base) MCG/ACT inhaler USE 2 INHALATIONS EVERY 6 HOURS AS NEEDED FOR WHEEZING OR SHORTNESS OF BREATH 8.5 g 13   triamcinolone (NASACORT) 55 MCG/ACT AERO nasal inhaler Place 2 sprays into the nose daily as needed. 1 each 5   triamterene-hydrochlorothiazide (MAXZIDE-25) 37.5-25 MG tablet TAKE 1 TABLET DAILY 90 tablet 3   Vitamin D, Ergocalciferol, (DRISDOL) 1.25 MG (50000 UNIT) CAPS capsule TAKE 1 CAPSULE EVERY 7 DAYS 16 capsule 3   No current facility-administered medications on file prior to visit.     Objective:  Objective  Physical Exam Constitutional:      General: She is not in acute distress.    Appearance: Normal appearance. She is not ill-appearing or toxic-appearing.  HENT:     Head: Normocephalic and atraumatic.     Right Ear: Tympanic membrane, ear canal and external ear normal.     Left Ear: Tympanic membrane, ear canal and external ear normal.     Nose: No congestion or rhinorrhea.  Eyes:     Extraocular Movements: Extraocular movements intact.     Pupils: Pupils are equal, round, and reactive to light.  Cardiovascular:     Rate and Rhythm: Normal rate and regular rhythm.     Pulses: Normal pulses.     Heart sounds: Normal heart sounds. No murmur heard. Pulmonary:     Effort: Pulmonary effort is normal. No respiratory distress.     Breath sounds: Normal breath sounds. No wheezing, rhonchi or rales.  Abdominal:     General: Bowel sounds are normal.     Palpations: Abdomen is soft. There is no mass.     Tenderness: There is no abdominal tenderness. There is no guarding.     Hernia: No hernia is present.  Musculoskeletal:        General: Normal range of motion.     Cervical back: Normal range of motion and neck supple.  Skin:    General: Skin is warm and dry.  Neurological:     Mental Status: She is alert and oriented to person, place, and time.  Psychiatric:        Behavior: Behavior normal.   BP 122/72   Pulse 75   Temp 97.9 F (36.6 C)   Resp 16   Wt  286 lb 3.2 oz (129.8 kg)   SpO2 97%   BMI 41.07 kg/m  Wt Readings from Last 3 Encounters:  08/20/21 286 lb 3.2 oz (129.8 kg)  03/21/21 286 lb 9.6 oz (130 kg)  09/11/20 284 lb 6.4 oz (129 kg)     Lab Results  Component Value Date   WBC 7.3 07/29/2021   HGB 11.9 (L) 07/29/2021   HCT 36.1 07/29/2021   PLT 285.0 07/29/2021   GLUCOSE 105 (H) 06/26/2021   CHOL 91 06/26/2021   TRIG 201.0 (H) 06/26/2021   HDL 20.90 (L) 06/26/2021   LDLDIRECT 46.0 06/26/2021   LDLCALC 39 09/11/2020   ALT 12 06/26/2021   AST 10 06/26/2021   NA 139 06/26/2021   K  4.4 06/26/2021   CL 103 06/26/2021   CREATININE 0.83 06/26/2021   BUN 15 06/26/2021   CO2 28 06/26/2021   TSH 2.11 03/21/2021   INR 1.03 03/04/2011   HGBA1C 6.9 (H) 06/26/2021   MICROALBUR 19.6 (H) 01/29/2016    No results found.   Assessment & Plan:  Plan    No orders of the defined types were placed in this encounter.   Problem List Items Addressed This Visit     Diabetes mellitus type 2 in obese ([place])    hgba1c acceptable, minimize simple carbs. Increase exercise as tolerated. Continue current meds. She is following with endocrinology      Diabetic retinopathy ([place])    She agrees to call her opthamologist and schedule appt.      Obesity    Encouraged DASH or MIND diet, decrease po intake and increase exercise as tolerated. Needs 7-8 hours of sleep nightly. Avoid trans fats, eat small, frequent meals every 4-5 hours with lean proteins, complex carbs and healthy fats. Minimize simple carbs, high fat foods and processed foods. She will check with insurance to confirm coverage of Wegovy or Saxenda and let us know      HTN (hypertension)    Well controlled, no changes to meds. Encouraged heart healthy diet such as the DASH diet and exercise as tolerated.       Preventative health care    She declines COVID shots but agrees to proceed with Shingrix shots. She will check with insurance to confirm coverage and proceed       Hyperlipidemia, mixed    Tolerating statin, encouraged heart healthy diet, avoid trans fats, minimize simple carbs and saturated fats. Increase exercise as tolerated      Cervical cancer screening    She is referred to GYN for well woman care. Her pap is due next month and she is worried about hormonal dysfunction.      Relevant Orders   Ambulatory referral to Obstetrics / Gynecology   Breast cancer screening    MGM ordered today, patient without concerns      Relevant Orders   MM 3D SCREEN BREAST BILATERAL   Other Visit Diagnoses     Colon cancer screening    -  Primary   Relevant Orders   Ambulatory referral to Gastroenterology       Follow-up: Return lab appt on 9/30 and CPE in 6 mn.  I, Suezanne Jacquet, acting as a scribe for Penni Homans, MD, have documented all relevent documentation on behalf of Penni Homans, MD, as directed by Penni Homans, MD while in the presence of Penni Homans, MD.  I, Mosie Lukes, MD personally performed the services described in this documentation. All medical record entries made by the scribe were at my direction and in my presence. I have reviewed the chart and agree that the record reflects my personal performance and is accurate and complete

## 2021-09-11 ENCOUNTER — Encounter

## 2021-09-12 ENCOUNTER — Other Ambulatory Visit: Payer: Self-pay | Admitting: Family Medicine

## 2021-09-17 ENCOUNTER — Other Ambulatory Visit: Payer: Self-pay

## 2021-09-17 ENCOUNTER — Ambulatory Visit (HOSPITAL_BASED_OUTPATIENT_CLINIC_OR_DEPARTMENT_OTHER)
Admission: RE | Admit: 2021-09-17 | Discharge: 2021-09-17 | Disposition: A | Discharge status: Home / Self Care | Source: Ambulatory Visit | Attending: Family Medicine | Admitting: Family Medicine

## 2021-09-17 ENCOUNTER — Encounter (HOSPITAL_BASED_OUTPATIENT_CLINIC_OR_DEPARTMENT_OTHER): Payer: Self-pay

## 2021-09-17 DIAGNOSIS — Z1231 Encounter for screening mammogram for malignant neoplasm of breast: Secondary | ICD-10-CM | POA: Insufficient documentation

## 2021-09-20 ENCOUNTER — Other Ambulatory Visit: Payer: Self-pay | Admitting: Family Medicine

## 2021-09-23 ENCOUNTER — Encounter: Payer: Self-pay | Admitting: Family Medicine

## 2021-09-25 ENCOUNTER — Encounter: Admitting: Gastroenterology

## 2021-09-27 ENCOUNTER — Other Ambulatory Visit: Payer: Self-pay

## 2021-09-27 ENCOUNTER — Other Ambulatory Visit (INDEPENDENT_AMBULATORY_CARE_PROVIDER_SITE_OTHER)

## 2021-09-27 DIAGNOSIS — Z0000 Encounter for general adult medical examination without abnormal findings: Secondary | ICD-10-CM

## 2021-09-27 DIAGNOSIS — E782 Mixed hyperlipidemia: Secondary | ICD-10-CM

## 2021-09-27 DIAGNOSIS — E669 Obesity, unspecified: Secondary | ICD-10-CM

## 2021-09-27 DIAGNOSIS — E1169 Type 2 diabetes mellitus with other specified complication: Secondary | ICD-10-CM | POA: Diagnosis not present

## 2021-09-27 LAB — CBC WITH DIFFERENTIAL/PLATELET
Basophils Absolute: 0.1 10*3/uL (ref 0.0–0.1)
Basophils Relative: 0.8 % (ref 0.0–3.0)
Eosinophils Absolute: 0.3 10*3/uL (ref 0.0–0.7)
Eosinophils Relative: 4.2 % (ref 0.0–5.0)
HCT: 35.4 % — ABNORMAL LOW (ref 36.0–46.0)
Hemoglobin: 11.5 g/dL — ABNORMAL LOW (ref 12.0–15.0)
Lymphocytes Relative: 28.7 % (ref 12.0–46.0)
Lymphs Abs: 2 10*3/uL (ref 0.7–4.0)
MCHC: 32.4 g/dL (ref 30.0–36.0)
MCV: 89 fl (ref 78.0–100.0)
Monocytes Absolute: 0.5 10*3/uL (ref 0.1–1.0)
Monocytes Relative: 7.6 % (ref 3.0–12.0)
Neutro Abs: 4.1 10*3/uL (ref 1.4–7.7)
Neutrophils Relative %: 58.7 % (ref 43.0–77.0)
Platelets: 293 10*3/uL (ref 150.0–400.0)
RBC: 3.98 Mil/uL (ref 3.87–5.11)
RDW: 13.4 % (ref 11.5–15.5)
WBC: 7 10*3/uL (ref 4.0–10.5)

## 2021-09-27 LAB — LIPID PANEL
Cholesterol: 84 mg/dL (ref 0–200)
HDL: 20.6 mg/dL — ABNORMAL LOW (ref >39.00)
NonHDL: 63.6
Total CHOL/HDL Ratio: 4
Triglycerides: 227 mg/dL — ABNORMAL HIGH (ref 0.0–149.0)
VLDL: 45.4 mg/dL — ABNORMAL HIGH (ref 0.0–40.0)

## 2021-09-27 LAB — COMPREHENSIVE METABOLIC PANEL
ALT: 14 U/L (ref 0–35)
AST: 13 U/L (ref 0–37)
Albumin: 3.7 g/dL (ref 3.5–5.2)
Alkaline Phosphatase: 105 U/L (ref 39–117)
BUN: 22 mg/dL (ref 6–23)
CO2: 28 mEq/L (ref 19–32)
Calcium: 8.6 mg/dL (ref 8.4–10.5)
Chloride: 104 mEq/L (ref 96–112)
Creatinine, Ser: 0.94 mg/dL (ref 0.40–1.20)
GFR: 70.42 mL/min (ref >60.00)
Glucose, Bld: 155 mg/dL — ABNORMAL HIGH (ref 70–99)
Potassium: 4.5 mEq/L (ref 3.5–5.1)
Sodium: 138 mEq/L (ref 135–145)
Total Bilirubin: 0.5 mg/dL (ref 0.2–1.2)
Total Protein: 6.4 g/dL (ref 6.0–8.3)

## 2021-09-27 LAB — HEMOGLOBIN A1C: Hgb A1c MFr Bld: 6.8 % — ABNORMAL HIGH (ref 4.6–6.5)

## 2021-09-27 LAB — LDL CHOLESTEROL, DIRECT: Direct LDL: 39 mg/dL

## 2021-10-02 ENCOUNTER — Encounter: Payer: Self-pay | Admitting: Family Medicine

## 2021-10-02 MED ORDER — FENOFIBRATE 120 MG PO TABS: 120.0000 mg | ORAL_TABLET | Freq: Every day | ORAL | 1 refills | Status: DC

## 2021-10-04 ENCOUNTER — Other Ambulatory Visit: Payer: Self-pay | Admitting: Family Medicine

## 2021-10-04 ENCOUNTER — Telehealth: Payer: Self-pay

## 2021-10-04 MED ORDER — TIRZEPATIDE 2.5 MG/0.5ML ~~LOC~~ SOAJ: 2.5000 mg | SUBCUTANEOUS | 0 refills | Status: DC

## 2021-10-04 NOTE — Telephone Encounter (Signed)
Patient returned phone call. Please contact patient back at your earliest connivance.

## 2021-10-04 NOTE — Telephone Encounter (Signed)
See phone message

## 2021-10-04 NOTE — Telephone Encounter (Signed)
Pt need to try 134 mg capsule or 2 54mg  tablet first. Pt needed a prior auth. For 120mg 

## 2021-10-07 ENCOUNTER — Encounter: Payer: Self-pay | Admitting: Family Medicine

## 2021-10-07 ENCOUNTER — Other Ambulatory Visit: Payer: Self-pay

## 2021-10-07 MED ORDER — FENOFIBRATE 134 MG PO CAPS: 134.0000 mg | ORAL_CAPSULE | Freq: Every day | ORAL | 11 refills | Status: DC

## 2021-10-07 NOTE — Telephone Encounter (Signed)
Pt is aware that she should stop trulicity

## 2021-10-07 NOTE — Telephone Encounter (Signed)
Pt would like to know if she should still take the trulicity also

## 2021-10-07 NOTE — Telephone Encounter (Signed)
Lvm to call back in °

## 2021-10-07 NOTE — Telephone Encounter (Signed)
She will be starting the fenofibrate when it comes

## 2021-10-22 ENCOUNTER — Other Ambulatory Visit: Payer: Self-pay | Admitting: Family Medicine

## 2021-10-25 ENCOUNTER — Telehealth: Payer: Self-pay | Admitting: *Deleted

## 2021-10-25 MED ORDER — TIRZEPATIDE 2.5 MG/0.5ML ~~LOC~~ SOAJ: 2.5000 mg | SUBCUTANEOUS | 0 refills | Status: DC

## 2021-10-25 NOTE — Telephone Encounter (Signed)
[facility] sent over a fax stating that pt requested a 90 day rx for mounjaro pen.  Rx sent in.

## 2021-11-21 ENCOUNTER — Other Ambulatory Visit: Payer: Self-pay | Admitting: Family Medicine

## 2021-12-04 ENCOUNTER — Other Ambulatory Visit: Payer: Self-pay | Admitting: Family Medicine

## 2021-12-04 DIAGNOSIS — F419 Anxiety disorder, unspecified: Secondary | ICD-10-CM

## 2021-12-05 NOTE — Telephone Encounter (Signed)
Vitamin d last checked 10/06/19 and was 36.88.  Requesting: lorazepam Contract: 03/01/21 UDS: 03/01/21 Last Visit: 08/20/21 Next Visit: 01/13/22 Last Refill: 08/02/21  Please Advise

## 2022-01-13 ENCOUNTER — Telehealth (INDEPENDENT_AMBULATORY_CARE_PROVIDER_SITE_OTHER): Admitting: Family Medicine

## 2022-01-13 DIAGNOSIS — E669 Obesity, unspecified: Secondary | ICD-10-CM

## 2022-01-13 DIAGNOSIS — I10 Essential (primary) hypertension: Secondary | ICD-10-CM

## 2022-01-13 DIAGNOSIS — E1169 Type 2 diabetes mellitus with other specified complication: Secondary | ICD-10-CM | POA: Diagnosis not present

## 2022-01-13 DIAGNOSIS — F419 Anxiety disorder, unspecified: Secondary | ICD-10-CM | POA: Diagnosis not present

## 2022-01-13 DIAGNOSIS — E782 Mixed hyperlipidemia: Secondary | ICD-10-CM | POA: Diagnosis not present

## 2022-01-13 MED ORDER — TIRZEPATIDE 12.5 MG/0.5ML ~~LOC~~ SOAJ: 12.5000 mg | SUBCUTANEOUS | 0 refills | Status: DC

## 2022-01-13 MED ORDER — TIRZEPATIDE 10 MG/0.5ML ~~LOC~~ SOAJ: 10.0000 mg | SUBCUTANEOUS | 0 refills | Status: DC

## 2022-01-13 MED ORDER — TIRZEPATIDE 15 MG/0.5ML ~~LOC~~ SOAJ: 15.0000 mg | SUBCUTANEOUS | 1 refills | Status: DC

## 2022-01-13 MED ORDER — TIRZEPATIDE 5 MG/0.5ML ~~LOC~~ SOAJ: 5.0000 mg | SUBCUTANEOUS | 0 refills | Status: DC

## 2022-01-13 MED ORDER — TIRZEPATIDE 7.5 MG/0.5ML ~~LOC~~ SOAJ: 7.5000 mg | SUBCUTANEOUS | 0 refills | Status: DC

## 2022-01-13 MED ORDER — GLIPIZIDE 10 MG PO TABS: 5.0000 mg | ORAL_TABLET | Freq: Two times a day (BID) | ORAL | 3 refills | Status: DC

## 2022-01-13 NOTE — Assessment & Plan Note (Signed)
Monitor and report any concerns. no changes to meds. Encouraged heart healthy diet such as the DASH diet and exercise as tolerated.  

## 2022-01-13 NOTE — Assessment & Plan Note (Signed)
Uses Alprazolam prn infrequently with good results no changes.

## 2022-01-13 NOTE — Progress Notes (Signed)
Subjective:    Patient ID: April Warren, female    DOB: May 01, 1971, 51 y.o.   MRN: [id number]  Chief Complaint  Patient presents with   Follow-up    HPI Patient is in today for follow up on new medication and chronic medical concerns. No recent febrile illness or hospitalizations. She continues to stay very busy with 5 kids at home. She has tolerated the Mounjaro but stayed on the 2.5 mg weekly dose and has not noted a decrease in her appetite yet. Denies CP/palp/SOB/HA/congestion/fevers/GI or GU c/o. Taking meds as prescribed   Past Medical History:  Diagnosis Date   Allergy    Chicken pox 51 yrs old   Diabetes mellitus 08-12-12   type 2- dr Zigmund Daniel diagnosed   Diabetic retinopathy ([place])    Dysmenorrhea 08/20/2012   HTN (hypertension) 08/20/2012   Hyperlipidemia, mixed 08/28/2013   Neuropathy 02/16/2017   Obesity    Obesity, unspecified 08/28/2013   Other and unspecified hyperlipidemia 08/28/2013   Preventative health care 08/20/2012   Sinusitis 11/14/2013   Sinusitis, acute 05/02/2016    Past Surgical History:  Procedure Laterality Date   REFRACTIVE SURGERY  08-12-12   right, left on 09/16/12    Family History  Problem Relation Age of Onset   Cancer Mother 29       breast   Diabetes Mother        type 2   Hypertension Mother    Hypertension Father    Hyperlipidemia Father    COPD Father    Hypertension Maternal Grandmother    Hyperlipidemia Maternal Grandmother    COPD Maternal Grandmother    Asthma Maternal Grandmother    Hypertension Maternal Grandfather    Heart attack Maternal Grandfather    Gout Brother    Cancer Brother        lung   Heart disease Paternal Grandfather     Social History   Socioeconomic History   Marital status: Married    Spouse name: Not on file   Number of children: Not on file   Years of education: Not on file   Highest education level: Not on file  Occupational History   Not on file  Tobacco Use   Smoking status: Never    Smokeless tobacco: Never  Substance and Sexual Activity   Alcohol use: Yes    Comment: socially   Drug use: No   Sexual activity: Yes    Partners: Male  Other Topics Concern   Not on file  Social History Narrative   Lives with husband.    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    Not exercising   Social Determinants of Health   Financial Resource Strain: Not on file  Food Insecurity: Not on file  Transportation Needs: Not on file  Physical Activity: Not on file  Stress: Not on file  Social Connections: Not on file  Intimate Partner Violence: Not on file    Outpatient Medications Prior to Visit  Medication Sig Dispense Refill   albuterol (PROAIR HFA) 108 (90 Base) MCG/ACT inhaler USE 2 INHALATIONS EVERY 6 HOURS AS NEEDED FOR WHEEZING OR SHORTNESS OF BREATH 18 g 5   atorvastatin (LIPITOR) 80 MG tablet TAKE 1 TABLET DAILY 90 tablet 1   Blood Glucose Monitoring Suppl (FREESTYLE FREEDOM LITE) w/Device KIT Check blood sugar twice daily 1 each 0   cetirizine (ZYRTEC) 10 MG  tablet TAKE 1 TABLET DAILY AS NEEDED FOR ALLERGIES 90 tablet 3   fenofibrate micronized (LOFIBRA) 134 MG capsule Take 1 capsule (134 mg total) by mouth daily before breakfast. 30 capsule 11   gabapentin (NEURONTIN) 300 MG capsule TAKE 1 CAPSULE TWICE A DAY AND 2 CAPSULES AT BEDTIME 360 capsule 3   glucose blood test strip Use as instructed 100 each 12   ibuprofen (ADVIL,MOTRIN) 200 MG tablet Take 400 mg by mouth every 8 (eight) hours as needed.     JANUVIA 100 MG tablet TAKE 1 TABLET DAILY 90 tablet 3   Lancets (FREESTYLE) lancets Check blood sugar twice daily 200 each 5   lisinopril (ZESTRIL) 20 MG tablet TAKE 1 TABLET TWICE A DAY 180 tablet 3   LORazepam (ATIVAN) 0.5 MG tablet TAKE 1 TABLET TWICE A DAY AS NEEDED FOR ANXIETY OR SLEEP 120 tablet 1   metFORMIN (GLUCOPHAGE-XR) 750 MG 24 hr tablet TAKE 1 TABLET THREE  TIMES A DAY 270 tablet 3   metoprolol tartrate (LOPRESSOR) 50 MG tablet TAKE 1 TABLET TWICE A DAY 180 tablet 3   montelukast (SINGULAIR) 10 MG tablet Take 1 tablet (10 mg total) by mouth at bedtime. 90 tablet 0   triamcinolone (NASACORT) 55 MCG/ACT AERO nasal inhaler Place 2 sprays into the nose daily as needed. 1 each 5   triamterene-hydrochlorothiazide (MAXZIDE-25) 37.5-25 MG tablet TAKE 1 TABLET DAILY 90 tablet 3   Vitamin D, Ergocalciferol, (DRISDOL) 1.25 MG (50000 UNIT) CAPS capsule TAKE 1 CAPSULE EVERY 7 DAYS 12 capsule 0   glipiZIDE (GLUCOTROL) 10 MG tablet TAKE 1 TABLET TWICE A DAY BEFORE MEALS 180 tablet 3   tirzepatide (MOUNJARO) 2.5 MG/0.5ML Pen Inject 2.5 mg into the skin once a week. 6 mL 0   No facility-administered medications prior to visit.    No Known Allergies  Review of Systems  Constitutional:  Positive for malaise/fatigue. Negative for fever.  HENT:  Negative for congestion.   Eyes:  Negative for blurred vision.  Respiratory:  Negative for shortness of breath.   Cardiovascular:  Negative for chest pain, palpitations and leg swelling.  Gastrointestinal:  Negative for abdominal pain, blood in stool and nausea.  Genitourinary:  Negative for dysuria and frequency.  Musculoskeletal:  Negative for falls.  Skin:  Negative for rash.  Neurological:  Negative for dizziness, loss of consciousness and headaches.  Endo/Heme/Allergies:  Negative for environmental allergies.  Psychiatric/Behavioral:  Negative for depression. The patient is nervous/anxious.       Objective:    Physical Exam Constitutional:      General: She is not in acute distress.    Appearance: Normal appearance. She is not ill-appearing or toxic-appearing.  HENT:     Head: Normocephalic and atraumatic.     Right Ear: External ear normal.     Left Ear: External ear normal.     Nose: Nose normal.  Eyes:     General:        Right eye: No discharge.        Left eye: No discharge.  Pulmonary:      Effort: Pulmonary effort is normal.  Skin:    Findings: No rash.  Neurological:     Mental Status: She is alert and oriented to person, place, and time.  Psychiatric:        Behavior: Behavior normal.    Wt 278 lb (126.1 kg)    BMI 39.89 kg/m  Wt Readings from Last 3 Encounters:  01/13/22 278 lb (126.1 kg)  08/20/21 286 lb 3.2 oz (129.8 kg)  03/21/21 286 lb 9.6 oz (130 kg)    Diabetic Foot Exam - Simple   No data filed    Lab Results  Component Value Date   WBC 7.0 09/27/2021   HGB 11.5 (L) 09/27/2021   HCT 35.4 (L) 09/27/2021   PLT 293.0 09/27/2021   GLUCOSE 155 (H) 09/27/2021   CHOL 84 09/27/2021   TRIG 227.0 (H) 09/27/2021   HDL 20.60 (L) 09/27/2021   LDLDIRECT 39.0 09/27/2021   LDLCALC 39 09/11/2020   ALT 14 09/27/2021   AST 13 09/27/2021   NA 138 09/27/2021   K 4.5 09/27/2021   CL 104 09/27/2021   CREATININE 0.94 09/27/2021   BUN 22 09/27/2021   CO2 28 09/27/2021   TSH 2.11 03/21/2021   INR 1.03 03/04/2011   HGBA1C 6.8 (H) 09/27/2021   MICROALBUR 19.6 (H) 01/29/2016    Lab Results  Component Value Date   TSH 2.11 03/21/2021   Lab Results  Component Value Date   WBC 7.0 09/27/2021   HGB 11.5 (L) 09/27/2021   HCT 35.4 (L) 09/27/2021   MCV 89.0 09/27/2021   PLT 293.0 09/27/2021   Lab Results  Component Value Date   NA 138 09/27/2021   K 4.5 09/27/2021   CO2 28 09/27/2021   GLUCOSE 155 (H) 09/27/2021   BUN 22 09/27/2021   CREATININE 0.94 09/27/2021   BILITOT 0.5 09/27/2021   ALKPHOS 105 09/27/2021   AST 13 09/27/2021   ALT 14 09/27/2021   PROT 6.4 09/27/2021   ALBUMIN 3.7 09/27/2021   CALCIUM 8.6 09/27/2021   GFR 70.42 09/27/2021   Lab Results  Component Value Date   CHOL 84 09/27/2021   Lab Results  Component Value Date   HDL 20.60 (L) 09/27/2021   Lab Results  Component Value Date   LDLCALC 39 09/11/2020   Lab Results  Component Value Date   TRIG 227.0 (H) 09/27/2021   Lab Results  Component Value Date   CHOLHDL 4  09/27/2021   Lab Results  Component Value Date   HGBA1C 6.8 (H) 09/27/2021       Assessment & Plan:   Problem List Items Addressed This Visit     Diabetes mellitus type 2 in obese ([place])    hgba1c acceptable, minimize simple carbs. Increase exercise as tolerated. Continue current meds but drop the Glipizide to 5 mg bid and lower as needed given she has had several sugars in the sixties and she has felt shakey when this occurs. We are going to increase the Mounjaro to 5 mg weekly x 4 weeks then 7.5 mg weekly x 4 weeks then 10 mg weekly x 4 weeks, then 12.5 mg weekly x 4 weeks and then 15 mg weekly. If her numbers drop we can stop the Glipizide and drop doses of Metformin down to one. If still running low would then cut Januvia in 1/2 and then consider stopping if need be. Check repeat hgba1c.       Relevant Medications   glipiZIDE (GLUCOTROL) 10 MG tablet   tirzepatide (MOUNJARO) 5 MG/0.5ML Pen   tirzepatide (MOUNJARO) 7.5 MG/0.5ML Pen   tirzepatide (MOUNJARO) 10 MG/0.5ML Pen   tirzepatide (MOUNJARO) 12.5 MG/0.5ML Pen   tirzepatide (MOUNJARO) 15 MG/0.5ML Pen   Obesity    Encouraged DASH or MIND diet, decrease po intake and increase exercise as tolerated. Needs 7-8 hours of sleep nightly. Avoid trans fats, eat small, frequent meals every 4-5 hours with lean  proteins, complex carbs and healthy fats. Minimize simple carbs, high fat foods and processed foods. Will increase Mounjaro to work on weight loss further.       Relevant Medications   glipiZIDE (GLUCOTROL) 10 MG tablet   tirzepatide (MOUNJARO) 5 MG/0.5ML Pen   tirzepatide (MOUNJARO) 7.5 MG/0.5ML Pen   tirzepatide (MOUNJARO) 10 MG/0.5ML Pen   tirzepatide (MOUNJARO) 12.5 MG/0.5ML Pen   tirzepatide (MOUNJARO) 15 MG/0.5ML Pen   HTN (hypertension)    Monitor and report any concerns. no changes to meds. Encouraged heart healthy diet such as the DASH diet and exercise as tolerated.       Hyperlipidemia, mixed    Tolerating statin,  encouraged heart healthy diet, avoid trans fats, minimize simple carbs and saturated fats. Increase exercise as tolerated      Anxiety    Uses Alprazolam prn infrequently with good results no changes.        I have discontinued Bobby Zwilling's tirzepatide. I have also changed her glipiZIDE. Additionally, I am having her start on tirzepatide, tirzepatide, tirzepatide, tirzepatide, and tirzepatide. Lastly, I am having her maintain her ibuprofen, FreeStyle [place] Lite, freestyle, glucose blood, triamcinolone, triamterene-hydrochlorothiazide, cetirizine, gabapentin, lisinopril, metFORMIN, metoprolol tartrate, Januvia, ProAir HFA, fenofibrate micronized, atorvastatin, montelukast, Vitamin D (Ergocalciferol), and LORazepam.  Meds ordered this encounter  Medications   glipiZIDE (GLUCOTROL) 10 MG tablet    Sig: Take 0.5 tablets (5 mg total) by mouth 2 (two) times daily before a meal.    Dispense:  180 tablet    Refill:  3   tirzepatide (MOUNJARO) 5 MG/0.5ML Pen    Sig: Inject 5 mg into the skin once a week.    Dispense:  6 mL    Refill:  0   tirzepatide (MOUNJARO) 7.5 MG/0.5ML Pen    Sig: Inject 7.5 mg into the skin once a week.    Dispense:  6 mL    Refill:  0   tirzepatide (MOUNJARO) 10 MG/0.5ML Pen    Sig: Inject 10 mg into the skin once a week.    Dispense:  6 mL    Refill:  0   tirzepatide (MOUNJARO) 12.5 MG/0.5ML Pen    Sig: Inject 12.5 mg into the skin once a week.    Dispense:  6 mL    Refill:  0   tirzepatide (MOUNJARO) 15 MG/0.5ML Pen    Sig: Inject 15 mg into the skin once a week.    Dispense:  6 mL    Refill:  1     Penni Homans, MD

## 2022-01-13 NOTE — Assessment & Plan Note (Signed)
Encouraged DASH or MIND diet, decrease po intake and increase exercise as tolerated. Needs 7-8 hours of sleep nightly. Avoid trans fats, eat small, frequent meals every 4-5 hours with lean proteins, complex carbs and healthy fats. Minimize simple carbs, high fat foods and processed foods. Will increase Mounjaro to work on weight loss further.

## 2022-01-13 NOTE — Assessment & Plan Note (Signed)
Tolerating statin, encouraged heart healthy diet, avoid trans fats, minimize simple carbs and saturated fats. Increase exercise as tolerated 

## 2022-01-13 NOTE — Assessment & Plan Note (Signed)
hgba1c acceptable, minimize simple carbs. Increase exercise as tolerated. Continue current meds but drop the Glipizide to 5 mg bid and lower as needed given she has had several sugars in the sixties and she has felt shakey when this occurs. We are going to increase the Mounjaro to 5 mg weekly x 4 weeks then 7.5 mg weekly x 4 weeks then 10 mg weekly x 4 weeks, then 12.5 mg weekly x 4 weeks and then 15 mg weekly. If her numbers drop we can stop the Glipizide and drop doses of Metformin down to one. If still running low would then cut Januvia in 1/2 and then consider stopping if need be. Check repeat hgba1c.

## 2022-01-15 ENCOUNTER — Other Ambulatory Visit: Payer: Self-pay | Admitting: Family Medicine

## 2022-01-15 ENCOUNTER — Encounter: Payer: Self-pay | Admitting: Family Medicine

## 2022-01-15 MED ORDER — LORAZEPAM 1 MG PO TABS: 0.5000 mg | ORAL_TABLET | Freq: Two times a day (BID) | ORAL | 1 refills | Status: DC | PRN

## 2022-01-22 ENCOUNTER — Other Ambulatory Visit: Payer: Self-pay | Admitting: Family Medicine

## 2022-01-22 ENCOUNTER — Telehealth: Payer: Self-pay | Admitting: Family Medicine

## 2022-01-22 DIAGNOSIS — T7840XD Allergy, unspecified, subsequent encounter: Secondary | ICD-10-CM

## 2022-01-22 DIAGNOSIS — E669 Obesity, unspecified: Secondary | ICD-10-CM

## 2022-01-22 NOTE — Telephone Encounter (Signed)
Patient states she took her last shot of Mounjaro on Monday, and the pharmacist with [facility] informed her that they wont have it in stock for a while. She states that the pharmacist recommended [facility] but she is not sure. Please advise.

## 2022-01-23 NOTE — Telephone Encounter (Signed)
Pt aware and she will discuss side effect with pharmacist

## 2022-02-19 ENCOUNTER — Other Ambulatory Visit: Payer: Self-pay | Admitting: Family Medicine

## 2022-02-25 ENCOUNTER — Other Ambulatory Visit: Payer: Self-pay | Admitting: Family Medicine

## 2022-02-25 NOTE — Telephone Encounter (Signed)
Pharmacy and patient contacted regarding prescription. Pharmacy will have medication ready for pt tomorrow.

## 2022-02-25 NOTE — Telephone Encounter (Signed)
Pt recently finished 4 weeks of the 5 MG. Please advise, thanks.

## 2022-02-25 NOTE — Telephone Encounter (Signed)
Pt states pharmacy does not have 5 MG for Mounjaro.   Pharmacy advised pt 10 MG in stock but needs provider's approval

## 2022-03-03 ENCOUNTER — Other Ambulatory Visit: Payer: Self-pay | Admitting: Family Medicine

## 2022-03-10 ENCOUNTER — Encounter: Admitting: Family Medicine

## 2022-04-14 NOTE — Progress Notes (Signed)
? ?Subjective:  ? ? Patient ID: April Warren, female    DOB: 02/24/1971, 51 y.o.   MRN: [id number] ? ?Chief Complaint  ?Patient presents with  ? Annual Exam  ? ? ?HPI ?Patient is in today for her annual physical exam and follow-up on chronic medical concerns.  Overall she is doing well.  No recent febrile illness or hospitalizations.  She does endorse right shoulder and left hip, left low back pain intermittently.  She does lift her very heavy 80-year-old frequently and thinks it may be related to that.  No recent fall or trauma.  Pain does fluctuate in intensity.  No complaints of polyuria or polydipsia.  She has been having trouble getting a hold of Mounjaro so has switched back to Trulicity.  She acknowledges due to the 5 children at home to care for she does not exercise regularly or maintain a heart healthy diet is much as she would like. Denies CP/palp/SOB/HA/congestion/fevers/GI or GU c/o. Taking meds as prescribed  ? ?Past Medical History:  ?Diagnosis Date  ? Allergy   ? Asthma   ? Certain time of the year  ? Chicken pox 52 yrs old  ? Diabetes mellitus 08/12/2012  ? type 2- dr Zigmund Daniel diagnosed  ? Diabetic retinopathy ([place])   ? Dysmenorrhea 08/20/2012  ? HTN (hypertension) 08/20/2012  ? Hyperlipidemia, mixed 08/28/2013  ? Neuromuscular disorder ([place])   ? Not sure of date  ? Neuropathy 02/16/2017  ? Obesity   ? Obesity, unspecified 08/28/2013  ? Other and unspecified hyperlipidemia 08/28/2013  ? Preventative health care 08/20/2012  ? Sinusitis 11/14/2013  ? Sinusitis, acute 05/02/2016  ? ? ?Past Surgical History:  ?Procedure Laterality Date  ? EYE SURGERY    ? Laser surgery for bleeds  ? REFRACTIVE SURGERY  08/12/2012  ? right, left on 09/16/12  ? ? ?Family History  ?Problem Relation Age of Onset  ? Cancer Mother   ?     breast  ? Diabetes Mother   ?     type 2  ? Hypertension Mother   ? Hypertension Father   ? Hyperlipidemia Father   ? COPD Father   ? Arthritis Father   ? Asthma Father   ? Hypertension Maternal  Grandmother   ? Hyperlipidemia Maternal Grandmother   ? COPD Maternal Grandmother   ? Asthma Maternal Grandmother   ? Hypertension Maternal Grandfather   ? Heart attack Maternal Grandfather   ? Stroke Maternal Grandfather   ? Gout Brother   ? Cancer Brother   ?     lung  ? Heart disease Paternal Grandfather   ? Alcohol abuse Paternal Grandfather   ? ? ?Social History  ? ?Socioeconomic History  ? Marital status: Married  ?  Spouse name: Not on file  ? Number of children: Not on file  ? Years of education: Not on file  ? Highest education level: Not on file  ?Occupational History  ? Not on file  ?Tobacco Use  ? Smoking status: Never  ? Smokeless tobacco: Never  ?Substance and Sexual Activity  ? Alcohol use: Yes  ?  Comment: One or two a year  ? Drug use: No  ? Sexual activity: Yes  ?  Partners: Male  ?  Birth control/protection: None  ?Other Topics Concern  ? Not on file  ?Social History Narrative  ? Lives with husband.   ? 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 No dietary restrictions.   ? Not exercising  ? ?Social Determinants of Health  ? ?Financial Resource Strain: Not on file  ?Food Insecurity: Not on file  ?Transportation Needs: Not on file  ?Physical Activity: Not on file  ?Stress: Not on file  ?Social Connections: Not on file  ?Intimate Partner Violence: Not on file  ? ? ?Outpatient Medications Prior to Visit  ?Medication Sig Dispense Refill  ? albuterol (PROAIR HFA) 108 (90 Base) MCG/ACT inhaler USE 2 INHALATIONS EVERY 6 HOURS AS NEEDED FOR WHEEZING OR SHORTNESS OF BREATH 18 g 5  ? atorvastatin (LIPITOR) 80 MG tablet TAKE 1 TABLET DAILY 90 tablet 1  ? Blood Glucose Monitoring Suppl (FREESTYLE FREEDOM LITE) w/Device KIT Check blood sugar twice daily 1 each 0  ? cetirizine (ZYRTEC) 10 MG tablet TAKE 1 TABLET DAILY AS NEEDED FOR ALLERGIES 90 tablet 1  ? fenofibrate micronized (LOFIBRA) 134 MG capsule Take 1 capsule (134 mg total) by  mouth daily before breakfast. 30 capsule 11  ? gabapentin (NEURONTIN) 300 MG capsule TAKE 1 CAPSULE TWICE A DAY AND 2 CAPSULES AT BEDTIME 360 capsule 1  ? glipiZIDE (GLUCOTROL) 10 MG tablet Take 0.5 tablets (5 mg total) by mouth 2 (two) times daily before a meal. 90 tablet 1  ? glucose blood test strip Use as instructed 100 each 12  ? ibuprofen (ADVIL,MOTRIN) 200 MG tablet Take 400 mg by mouth every 8 (eight) hours as needed.    ? JANUVIA 100 MG tablet TAKE 1 TABLET DAILY 90 tablet 3  ? Lancets (FREESTYLE) lancets Check blood sugar twice daily 200 each 5  ? lisinopril (ZESTRIL) 20 MG tablet TAKE 1 TABLET TWICE A DAY 180 tablet 3  ? LORazepam (ATIVAN) 1 MG tablet Take 0.5 tablets (0.5 mg total) by mouth 2 (two) times daily as needed for anxiety. 90 tablet 1  ? metFORMIN (GLUCOPHAGE-XR) 750 MG 24 hr tablet TAKE 1 TABLET THREE TIMES A DAY 270 tablet 3  ? metoprolol tartrate (LOPRESSOR) 50 MG tablet TAKE 1 TABLET TWICE A DAY 180 tablet 3  ? montelukast (SINGULAIR) 10 MG tablet TAKE 1 TABLET AT BEDTIME 90 tablet 3  ? triamcinolone (NASACORT) 55 MCG/ACT AERO nasal inhaler Place 2 sprays into the nose daily as needed. 1 each 5  ? triamterene-hydrochlorothiazide (MAXZIDE-25) 37.5-25 MG tablet TAKE 1 TABLET DAILY 90 tablet 1  ? Vitamin D, Ergocalciferol, (DRISDOL) 1.25 MG (50000 UNIT) CAPS capsule TAKE 1 CAPSULE EVERY 7 DAYS 12 capsule 3  ? tirzepatide (MOUNJARO) 10 MG/0.5ML Pen Inject 10 mg into the skin once a week. 6 mL 0  ? tirzepatide (MOUNJARO) 12.5 MG/0.5ML Pen Inject 12.5 mg into the skin once a week. 6 mL 0  ? tirzepatide (MOUNJARO) 15 MG/0.5ML Pen Inject 15 mg into the skin once a week. 6 mL 1  ? tirzepatide (MOUNJARO) 5 MG/0.5ML Pen Inject 5 mg into the skin once a week. 6 mL 0  ? tirzepatide (MOUNJARO) 7.5 MG/0.5ML Pen Inject 7.5 mg into the skin once a week. 6 mL 0  ? ?No facility-administered medications prior to visit.  ? ? ?No Known Allergies ? ?Review of Systems  ?Constitutional:  Negative for chills, fever  and malaise/fatigue.  ?HENT:  Negative for congestion and hearing loss.   ?Eyes:  Negative for discharge.  ?Respiratory:  Negative for cough, sputum production and shortness of breath.   ?Cardiovascular:  Negative for chest pain, palpitations and leg swelling.  ?Gastrointestinal:  Negative for abdominal pain, blood in  stool, constipation, diarrhea, heartburn, nausea and vomiting.  ?Genitourinary:  Negative for dysuria, frequency, hematuria and urgency.  ?Musculoskeletal:  Negative for back pain, falls and myalgias.  ?Skin:  Negative for rash.  ?Neurological:  Negative for dizziness, sensory change, loss of consciousness, weakness and headaches.  ?Endo/Heme/Allergies:  Negative for environmental allergies. Does not bruise/bleed easily.  ?Psychiatric/Behavioral:  Negative for depression and suicidal ideas. The patient is not nervous/anxious and does not have insomnia.   ? ?   ?Objective:  ?  ?Physical Exam ? ?BP 122/80 (BP Location: Left Arm, Patient Position: Sitting, Cuff Size: Large)   Pulse 68   Resp 20   Wt 278 lb 9.6 oz (126.4 kg)   SpO2 98%   BMI 39.97 kg/m?  ?Wt Readings from Last 3 Encounters:  ?04/15/22 278 lb 9.6 oz (126.4 kg)  ?01/13/22 278 lb (126.1 kg)  ?08/20/21 286 lb 3.2 oz (129.8 kg)  ? ? ?Diabetic Foot Exam - Simple   ?No data filed ?  ? ?Lab Results  ?Component Value Date  ? WBC 7.0 09/27/2021  ? HGB 11.5 (L) 09/27/2021  ? HCT 35.4 (L) 09/27/2021  ? PLT 293.0 09/27/2021  ? GLUCOSE 155 (H) 09/27/2021  ? CHOL 84 09/27/2021  ? TRIG 227.0 (H) 09/27/2021  ? HDL 20.60 (L) 09/27/2021  ? LDLDIRECT 39.0 09/27/2021  ? Jud 39 09/11/2020  ? ALT 14 09/27/2021  ? AST 13 09/27/2021  ? NA 138 09/27/2021  ? K 4.5 09/27/2021  ? CL 104 09/27/2021  ? CREATININE 0.94 09/27/2021  ? BUN 22 09/27/2021  ? CO2 28 09/27/2021  ? TSH 2.11 03/21/2021  ? INR 1.03 03/04/2011  ? HGBA1C 6.8 (H) 09/27/2021  ? MICROALBUR 19.6 (H) 01/29/2016  ? ? ?Lab Results  ?Component Value Date  ? TSH 2.11 03/21/2021  ? ?Lab Results   ?Component Value Date  ? WBC 7.0 09/27/2021  ? HGB 11.5 (L) 09/27/2021  ? HCT 35.4 (L) 09/27/2021  ? MCV 89.0 09/27/2021  ? PLT 293.0 09/27/2021  ? ?Lab Results  ?Component Value Date  ? NA 138 09/27/2021  ? K 4.

## 2022-04-15 ENCOUNTER — Ambulatory Visit (INDEPENDENT_AMBULATORY_CARE_PROVIDER_SITE_OTHER): Admitting: Family Medicine

## 2022-04-15 ENCOUNTER — Encounter: Payer: Self-pay | Admitting: Family Medicine

## 2022-04-15 ENCOUNTER — Other Ambulatory Visit (HOSPITAL_BASED_OUTPATIENT_CLINIC_OR_DEPARTMENT_OTHER): Payer: Self-pay

## 2022-04-15 VITALS — BP 122/80 | HR 68 | Resp 20 | Wt 278.6 lb

## 2022-04-15 DIAGNOSIS — E669 Obesity, unspecified: Secondary | ICD-10-CM | POA: Diagnosis not present

## 2022-04-15 DIAGNOSIS — M25552 Pain in left hip: Secondary | ICD-10-CM

## 2022-04-15 DIAGNOSIS — E1169 Type 2 diabetes mellitus with other specified complication: Secondary | ICD-10-CM

## 2022-04-15 DIAGNOSIS — Z1211 Encounter for screening for malignant neoplasm of colon: Secondary | ICD-10-CM

## 2022-04-15 DIAGNOSIS — Z124 Encounter for screening for malignant neoplasm of cervix: Secondary | ICD-10-CM

## 2022-04-15 DIAGNOSIS — E782 Mixed hyperlipidemia: Secondary | ICD-10-CM | POA: Diagnosis not present

## 2022-04-15 DIAGNOSIS — Z0000 Encounter for general adult medical examination without abnormal findings: Secondary | ICD-10-CM | POA: Diagnosis not present

## 2022-04-15 DIAGNOSIS — I10 Essential (primary) hypertension: Secondary | ICD-10-CM

## 2022-04-15 DIAGNOSIS — M25511 Pain in right shoulder: Secondary | ICD-10-CM

## 2022-04-15 DIAGNOSIS — E11319 Type 2 diabetes mellitus with unspecified diabetic retinopathy without macular edema: Secondary | ICD-10-CM

## 2022-04-15 DIAGNOSIS — Z79899 Other long term (current) drug therapy: Secondary | ICD-10-CM

## 2022-04-15 LAB — COMPREHENSIVE METABOLIC PANEL
ALT: 19 U/L (ref 0–35)
AST: 20 U/L (ref 0–37)
Albumin: 4.2 g/dL (ref 3.5–5.2)
Alkaline Phosphatase: 57 U/L (ref 39–117)
BUN: 31 mg/dL — ABNORMAL HIGH (ref 6–23)
CO2: 26 mEq/L (ref 19–32)
Calcium: 8.9 mg/dL (ref 8.4–10.5)
Chloride: 105 mEq/L (ref 96–112)
Creatinine, Ser: 1.44 mg/dL — ABNORMAL HIGH (ref 0.40–1.20)
GFR: 42.05 mL/min — ABNORMAL LOW (ref >60.00)
Glucose, Bld: 55 mg/dL — ABNORMAL LOW (ref 70–99)
Potassium: 4.7 mEq/L (ref 3.5–5.1)
Sodium: 139 mEq/L (ref 135–145)
Total Bilirubin: 0.6 mg/dL (ref 0.2–1.2)
Total Protein: 6.9 g/dL (ref 6.0–8.3)

## 2022-04-15 LAB — CBC WITH DIFFERENTIAL/PLATELET
Basophils Absolute: 0.1 10*3/uL (ref 0.0–0.1)
Basophils Relative: 0.9 % (ref 0.0–3.0)
Eosinophils Absolute: 0.2 10*3/uL (ref 0.0–0.7)
Eosinophils Relative: 1.9 % (ref 0.0–5.0)
HCT: 35.7 % — ABNORMAL LOW (ref 36.0–46.0)
Hemoglobin: 11.7 g/dL — ABNORMAL LOW (ref 12.0–15.0)
Lymphocytes Relative: 31.5 % (ref 12.0–46.0)
Lymphs Abs: 2.9 10*3/uL (ref 0.7–4.0)
MCHC: 32.7 g/dL (ref 30.0–36.0)
MCV: 91.5 fl (ref 78.0–100.0)
Monocytes Absolute: 0.5 10*3/uL (ref 0.1–1.0)
Monocytes Relative: 5.8 % (ref 3.0–12.0)
Neutro Abs: 5.6 10*3/uL (ref 1.4–7.7)
Neutrophils Relative %: 59.9 % (ref 43.0–77.0)
Platelets: 333 10*3/uL (ref 150.0–400.0)
RBC: 3.9 Mil/uL (ref 3.87–5.11)
RDW: 14 % (ref 11.5–15.5)
WBC: 9.3 10*3/uL (ref 4.0–10.5)

## 2022-04-15 LAB — LIPID PANEL
Cholesterol: 106 mg/dL (ref 0–200)
HDL: 21.2 mg/dL — ABNORMAL LOW (ref >39.00)
NonHDL: 84.52
Total CHOL/HDL Ratio: 5
Triglycerides: 257 mg/dL — ABNORMAL HIGH (ref 0.0–149.0)
VLDL: 51.4 mg/dL — ABNORMAL HIGH (ref 0.0–40.0)

## 2022-04-15 LAB — MICROALBUMIN / CREATININE URINE RATIO
Creatinine,U: 103.4 mg/dL
Microalb Creat Ratio: 1.5 mg/g (ref 0.0–30.0)
Microalb, Ur: 1.6 mg/dL (ref 0.0–1.9)

## 2022-04-15 LAB — TSH: TSH: 1.09 u[IU]/mL (ref 0.35–5.50)

## 2022-04-15 LAB — HEMOGLOBIN A1C: Hgb A1c MFr Bld: 6.2 % (ref 4.6–6.5)

## 2022-04-15 LAB — LDL CHOLESTEROL, DIRECT: Direct LDL: 51 mg/dL

## 2022-04-15 MED ORDER — TRULICITY 4.5 MG/0.5ML ~~LOC~~ SOAJ: 4.5000 mg | SUBCUTANEOUS | 1 refills | Status: DC

## 2022-04-15 MED ORDER — TIRZEPATIDE 12.5 MG/0.5ML ~~LOC~~ SOAJ
12.5000 mg | SUBCUTANEOUS | 0 refills | Status: DC
  Filled 2022-04-15: qty 6, 84d supply, fill #0

## 2022-04-15 MED ORDER — TRULICITY 3 MG/0.5ML ~~LOC~~ SOAJ
3.0000 mg | SUBCUTANEOUS | 0 refills | Status: DC
  Filled 2022-04-15: qty 2, 28d supply, fill #0

## 2022-04-15 MED ORDER — TRULICITY 3 MG/0.5ML ~~LOC~~ SOAJ: 3.0000 mg | SUBCUTANEOUS | 0 refills | Status: DC

## 2022-04-15 MED ORDER — TRULICITY 4.5 MG/0.5ML ~~LOC~~ SOAJ
4.5000 mg | SUBCUTANEOUS | 1 refills | Status: DC
  Filled 2022-04-15: qty 2, 28d supply, fill #0

## 2022-04-15 NOTE — Patient Instructions (Addendum)
Preventive Care 40-52 Years Old, Female Preventive care refers to lifestyle choices and visits with your health care provider that can promote health and wellness. Preventive care visits are also called wellness exams. What can I expect for my preventive care visit? Counseling Your health care provider may ask you questions about your: Medical history, including: Past medical problems. Family medical history. Pregnancy history. Current health, including: Menstrual cycle. Method of birth control. Emotional well-being. Home life and relationship well-being. Sexual activity and sexual health. Lifestyle, including: Alcohol, nicotine or tobacco, and drug use. Access to firearms. Diet, exercise, and sleep habits. Work and work environment. Sunscreen use. Safety issues such as seatbelt and bike helmet use. Physical exam Your health care provider will check your: Height and weight. These may be used to calculate your BMI (body mass index). BMI is a measurement that tells if you are at a healthy weight. Waist circumference. This measures the distance around your waistline. This measurement also tells if you are at a healthy weight and may help predict your risk of certain diseases, such as type 2 diabetes and high blood pressure. Heart rate and blood pressure. Body temperature. Skin for abnormal spots. What immunizations do I need?  Vaccines are usually given at various ages, according to a schedule. Your health care provider will recommend vaccines for you based on your age, medical history, and lifestyle or other factors, such as travel or where you work. What tests do I need? Screening Your health care provider may recommend screening tests for certain conditions. This may include: Lipid and cholesterol levels. Diabetes screening. This is done by checking your blood sugar (glucose) after you have not eaten for a while (fasting). Pelvic exam and Pap test. Hepatitis B test. Hepatitis C  test. HIV (human immunodeficiency virus) test. STI (sexually transmitted infection) testing, if you are at risk. Lung cancer screening. Colorectal cancer screening. Mammogram. Talk with your health care provider about when you should start having regular mammograms. This may depend on whether you have a family history of breast cancer. BRCA-related cancer screening. This may be done if you have a family history of breast, ovarian, tubal, or peritoneal cancers. Bone density scan. This is done to screen for osteoporosis. Talk with your health care provider about your test results, treatment options, and if necessary, the need for more tests. Follow these instructions at home: Eating and drinking  Eat a diet that includes fresh fruits and vegetables, whole grains, lean protein, and low-fat dairy products. Take vitamin and mineral supplements as recommended by your health care provider. Do not drink alcohol if: Your health care provider tells you not to drink. You are pregnant, may be pregnant, or are planning to become pregnant. If you drink alcohol: Limit how much you have to 0-1 drink a day. Know how much alcohol is in your drink. In the [place] one drink equals one 12 oz bottle of beer (355 mL), one 5 oz glass of wine (148 mL), or one 1 oz glass of hard liquor (44 mL). Lifestyle Brush your teeth every morning and night with fluoride toothpaste. Floss one time each day. Exercise for at least 30 minutes 5 or more days each week. Do not use any products that contain nicotine or tobacco. These products include cigarettes, chewing tobacco, and vaping devices, such as e-cigarettes. If you need help quitting, ask your health care provider. Do not use drugs. If you are sexually active, practice safe sex. Use a condom or other form of protection to   prevent STIs. If you do not wish to become pregnant, use a form of birth control. If you plan to become pregnant, see your health care provider for a  prepregnancy visit. Take aspirin only as told by your health care provider. Make sure that you understand how much to take and what form to take. Work with your health care provider to find out whether it is safe and beneficial for you to take aspirin daily. Find healthy ways to manage stress, such as: Meditation, yoga, or listening to music. Journaling. Talking to a trusted person. Spending time with friends and family. Minimize exposure to UV radiation to reduce your risk of skin cancer. Safety Always wear your seat belt while driving or riding in a vehicle. Do not drive: If you have been drinking alcohol. Do not ride with someone who has been drinking. When you are tired or distracted. While texting. If you have been using any mind-altering substances or drugs. Wear a helmet and other protective equipment during sports activities. If you have firearms in your house, make sure you follow all gun safety procedures. Seek help if you have been physically or sexually abused. What's next? Visit your health care provider once a year for an annual wellness visit. Ask your health care provider how often you should have your eyes and teeth checked. Stay up to date on all vaccines. This information is not intended to replace advice given to you by your health care provider. Make sure you discuss any questions you have with your health care provider. Document Revised: 06/12/2021 Document Reviewed: 06/12/2021 Elsevier Patient Education  2023 Elsevier Inc.  

## 2022-04-15 NOTE — Assessment & Plan Note (Signed)
Patient encouraged to maintain heart healthy diet, regular exercise, adequate sleep. Consider daily probiotics. Take medications as prescribed. Given ACP packet. Labs ordered and reviewed. Referred to OB for pap and MGM, referred to gastroenterology for colonoscopy. ?

## 2022-04-15 NOTE — Assessment & Plan Note (Signed)
Referred to opthamology for new exam ?

## 2022-04-15 NOTE — Assessment & Plan Note (Signed)
Encourage heart healthy diet such as MIND or DASH diet, increase exercise, avoid trans fats, simple carbohydrates and processed foods, consider a krill or fish or flaxseed oil cap daily.  °

## 2022-04-15 NOTE — Assessment & Plan Note (Signed)
hgba1c acceptable, minimize simple carbs. Increase exercise as tolerated. Continue current meds. Has been unable to get [facility] so had gone back to her Trulicity. Sent in 3 mg dose weekly for a month and then increase to 4.5 weekly ?

## 2022-04-15 NOTE — Assessment & Plan Note (Signed)
Well controlled, no changes to meds. Encouraged heart healthy diet such as the DASH diet and exercise as tolerated.  °

## 2022-04-15 NOTE — Assessment & Plan Note (Signed)
Comes and goes over several months, likely related to picking up her kids frequently. Consider PT and further evaluation as needed. Try moist heat and stretching along with topical rubs and strengthening  ?

## 2022-04-15 NOTE — Assessment & Plan Note (Signed)
Referred to OB/GYN for well woman care.  °

## 2022-04-15 NOTE — Assessment & Plan Note (Signed)
Comes and goes over several months, likely related to picking up her kids frequently. Consider PT and further evaluation as needed. Try moist heat and stretching along with topical rubs and strengthening  ?

## 2022-04-16 ENCOUNTER — Other Ambulatory Visit: Payer: Self-pay

## 2022-04-16 ENCOUNTER — Telehealth: Payer: Self-pay

## 2022-04-16 DIAGNOSIS — R7989 Other specified abnormal findings of blood chemistry: Secondary | ICD-10-CM

## 2022-04-16 NOTE — Telephone Encounter (Signed)
Pt states she just got a 3 month refill of metformin 750 and does not want to waste it. Pt will reduce to once daily instead of 3 times daily.  ?

## 2022-04-18 LAB — DRUG MONITORING, PANEL 8 WITH CONFIRMATION, URINE
6 Acetylmorphine: NEGATIVE ng/mL (ref <10)
Alcohol Metabolites: NEGATIVE ng/mL (ref <500)
Alphahydroxyalprazolam: NEGATIVE ng/mL (ref <25)
Alphahydroxymidazolam: NEGATIVE ng/mL (ref <50)
Alphahydroxytriazolam: NEGATIVE ng/mL (ref <50)
Aminoclonazepam: NEGATIVE ng/mL (ref <25)
Amphetamines: NEGATIVE ng/mL (ref <500)
Benzodiazepines: POSITIVE ng/mL — AB (ref <100)
Buprenorphine, Urine: NEGATIVE ng/mL (ref <5)
Cocaine Metabolite: NEGATIVE ng/mL (ref <150)
Creatinine: 107.1 mg/dL (ref >=20.0)
Hydroxyethylflurazepam: NEGATIVE ng/mL (ref <50)
Lorazepam: 569 ng/mL — ABNORMAL HIGH (ref <50)
MDMA: NEGATIVE ng/mL (ref <500)
Marijuana Metabolite: NEGATIVE ng/mL (ref <20)
Nordiazepam: NEGATIVE ng/mL (ref <50)
Opiates: NEGATIVE ng/mL (ref <100)
Oxazepam: NEGATIVE ng/mL (ref <50)
Oxidant: NEGATIVE ug/mL (ref <200)
Oxycodone: NEGATIVE ng/mL (ref <100)
Temazepam: NEGATIVE ng/mL (ref <50)
pH: 5 (ref 4.5–9.0)

## 2022-04-18 LAB — DM TEMPLATE

## 2022-04-21 ENCOUNTER — Other Ambulatory Visit: Payer: Self-pay | Admitting: Family Medicine

## 2022-04-28 ENCOUNTER — Other Ambulatory Visit: Payer: Self-pay

## 2022-04-30 ENCOUNTER — Other Ambulatory Visit: Payer: Self-pay | Admitting: Family Medicine

## 2022-05-06 ENCOUNTER — Encounter: Payer: Self-pay | Admitting: Family Medicine

## 2022-05-06 ENCOUNTER — Other Ambulatory Visit (INDEPENDENT_AMBULATORY_CARE_PROVIDER_SITE_OTHER)

## 2022-05-06 DIAGNOSIS — R7989 Other specified abnormal findings of blood chemistry: Secondary | ICD-10-CM

## 2022-05-06 LAB — COMPREHENSIVE METABOLIC PANEL
ALT: 21 U/L (ref 0–35)
AST: 17 U/L (ref 0–37)
Albumin: 3.9 g/dL (ref 3.5–5.2)
Alkaline Phosphatase: 71 U/L (ref 39–117)
BUN: 21 mg/dL (ref 6–23)
CO2: 29 mEq/L (ref 19–32)
Calcium: 8.8 mg/dL (ref 8.4–10.5)
Chloride: 104 mEq/L (ref 96–112)
Creatinine, Ser: 1.29 mg/dL — ABNORMAL HIGH (ref 0.40–1.20)
GFR: 47.96 mL/min — ABNORMAL LOW (ref >60.00)
Glucose, Bld: 158 mg/dL — ABNORMAL HIGH (ref 70–99)
Potassium: 4.7 mEq/L (ref 3.5–5.1)
Sodium: 140 mEq/L (ref 135–145)
Total Bilirubin: 0.5 mg/dL (ref 0.2–1.2)
Total Protein: 6.7 g/dL (ref 6.0–8.3)

## 2022-05-14 NOTE — Telephone Encounter (Signed)
CMP ordered, scheduled pt lab appointment. ?

## 2022-05-30 ENCOUNTER — Other Ambulatory Visit: Payer: Self-pay

## 2022-05-30 ENCOUNTER — Telehealth: Payer: Self-pay | Admitting: Family Medicine

## 2022-05-30 MED ORDER — TRULICITY 4.5 MG/0.5ML ~~LOC~~ SOAJ: 4.5000 mg | SUBCUTANEOUS | 1 refills | Status: DC

## 2022-05-30 NOTE — Telephone Encounter (Signed)
April Warren (spouse DPR OK) called stating that the pharmacy would be faxing over a request for the following medication. April Warren states they told him the request was denied for some reason and was wanting a call back to go over that.  Medication:   Dulaglutide (TRULICITY) 4.5 MG/0.5ML SOPN [[id number]]   Has the patient contacted their pharmacy? Yes.   (If no, request that the patient contact the pharmacy for the refill.) (If yes, when and what did the pharmacy advise?) Pharmacy would fax over request  Preferred Pharmacy (with phone number or street name):   [facility] - [place], [place] - [address]  [address] [place] [address]  Phone:  [phone]  Fax:  [phone]   Agent: Please be advised that RX refills may take up to 3 business days. We ask that you follow-up with your pharmacy.

## 2022-05-30 NOTE — Telephone Encounter (Signed)
Looks like the 3MG  request was refused as pt needs to fill the 4.5MG  dose now.   4.5MG  dose refill has been sent to pharmacy.

## 2022-06-04 ENCOUNTER — Encounter: Payer: Self-pay | Admitting: Gastroenterology

## 2022-06-05 ENCOUNTER — Other Ambulatory Visit: Payer: Self-pay

## 2022-06-05 ENCOUNTER — Other Ambulatory Visit (INDEPENDENT_AMBULATORY_CARE_PROVIDER_SITE_OTHER)

## 2022-06-05 DIAGNOSIS — R7989 Other specified abnormal findings of blood chemistry: Secondary | ICD-10-CM

## 2022-06-05 LAB — COMPREHENSIVE METABOLIC PANEL
ALT: 18 U/L (ref 0–35)
AST: 17 U/L (ref 0–37)
Albumin: 3.9 g/dL (ref 3.5–5.2)
Alkaline Phosphatase: 68 U/L (ref 39–117)
BUN: 21 mg/dL (ref 6–23)
CO2: 28 mEq/L (ref 19–32)
Calcium: 9.2 mg/dL (ref 8.4–10.5)
Chloride: 105 mEq/L (ref 96–112)
Creatinine, Ser: 1.19 mg/dL (ref 0.40–1.20)
GFR: 52.81 mL/min — ABNORMAL LOW (ref >60.00)
Glucose, Bld: 156 mg/dL — ABNORMAL HIGH (ref 70–99)
Potassium: 4.7 mEq/L (ref 3.5–5.1)
Sodium: 140 mEq/L (ref 135–145)
Total Bilirubin: 0.5 mg/dL (ref 0.2–1.2)
Total Protein: 6.4 g/dL (ref 6.0–8.3)

## 2022-06-05 NOTE — Addendum Note (Signed)
Addended by: Manuela Schwartz on: 06/05/2022 07:28 AM   Modules accepted: Orders

## 2022-06-06 ENCOUNTER — Encounter

## 2022-06-29 ENCOUNTER — Other Ambulatory Visit: Payer: Self-pay | Admitting: Family Medicine

## 2022-06-30 NOTE — Telephone Encounter (Signed)
Requesting: Ativan 1MG  Contract: 03/01/21 UDS: 04/15/22 Last Visit: 04/15/22 Next Visit: 08/18/22 Last Refill: 01/15/22  Please Advise

## 2022-07-03 ENCOUNTER — Other Ambulatory Visit: Payer: Self-pay | Admitting: Family Medicine

## 2022-07-03 DIAGNOSIS — T7840XD Allergy, unspecified, subsequent encounter: Secondary | ICD-10-CM

## 2022-07-04 ENCOUNTER — Telehealth: Payer: Self-pay | Admitting: *Deleted

## 2022-07-04 ENCOUNTER — Encounter

## 2022-07-04 NOTE — Telephone Encounter (Signed)
Attempted to call x 2 message left with call back number to return call by 5 pm to reschedule pre-visit and if no returned call received we will have cancel upcoming procedure.

## 2022-07-04 NOTE — Telephone Encounter (Signed)
Pt. Called back and cancelled pre-visit and procedure.

## 2022-07-09 ENCOUNTER — Encounter: Admitting: Gastroenterology

## 2022-07-14 IMAGING — MG MM DIGITAL SCREENING BILAT W/ TOMO AND CAD
8 of 15 series · 8 of 40 positions shown · non-contrast
Comparison: Previous exam(s).

CLINICAL DATA: Screening.

EXAM:
DIGITAL SCREENING BILATERAL MAMMOGRAM WITH TOMOSYNTHESIS AND CAD
TECHNIQUE: Bilateral screening digital craniocaudal and mediolateral oblique
mammograms were obtained. Bilateral screening digital breast
tomosynthesis was performed. The images were evaluated with
computer-aided detection.

[R CC synth-2D (1 of 2)]
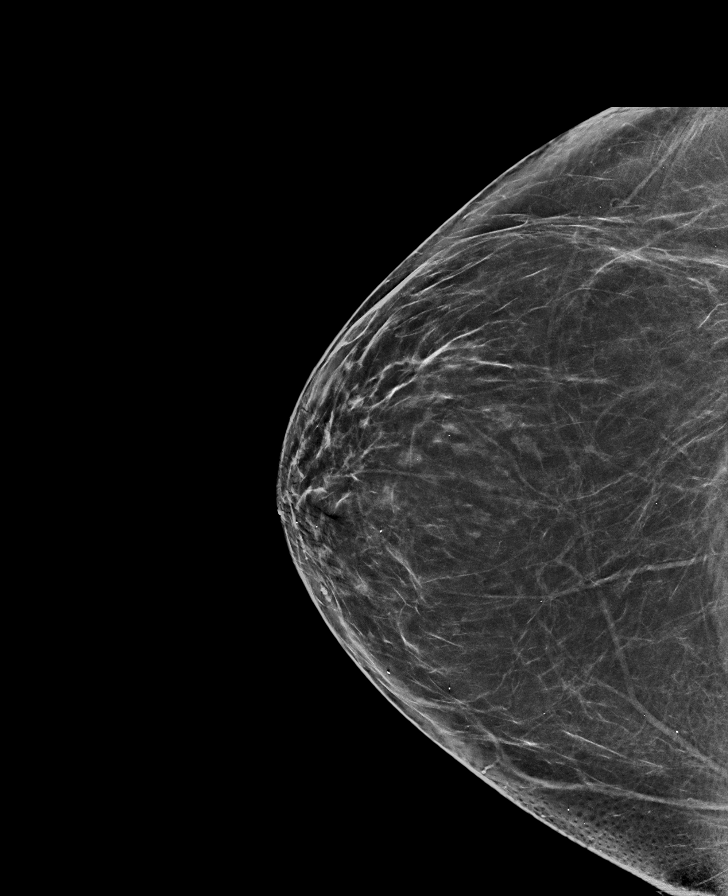

[R MLO synth-2D (1 of 2)]
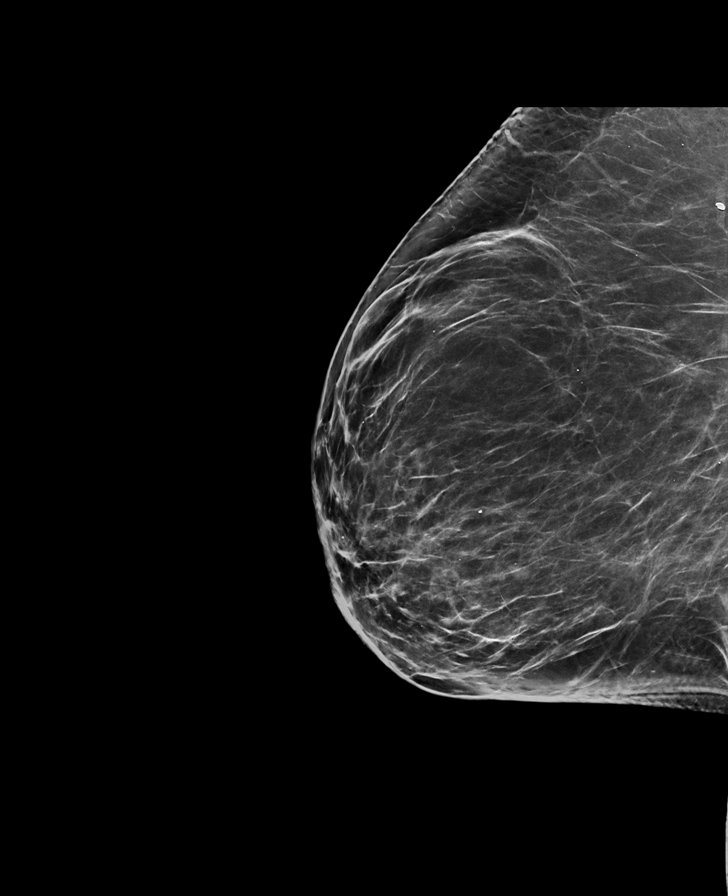

[R MLO synth-2D (2 of 2)]
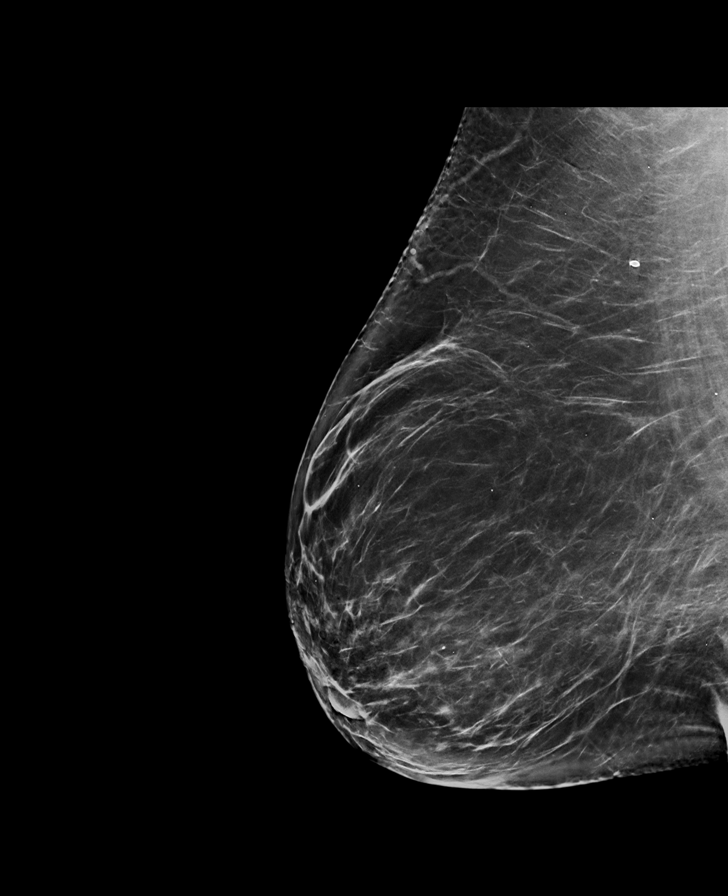

[L MLO synth-2D]
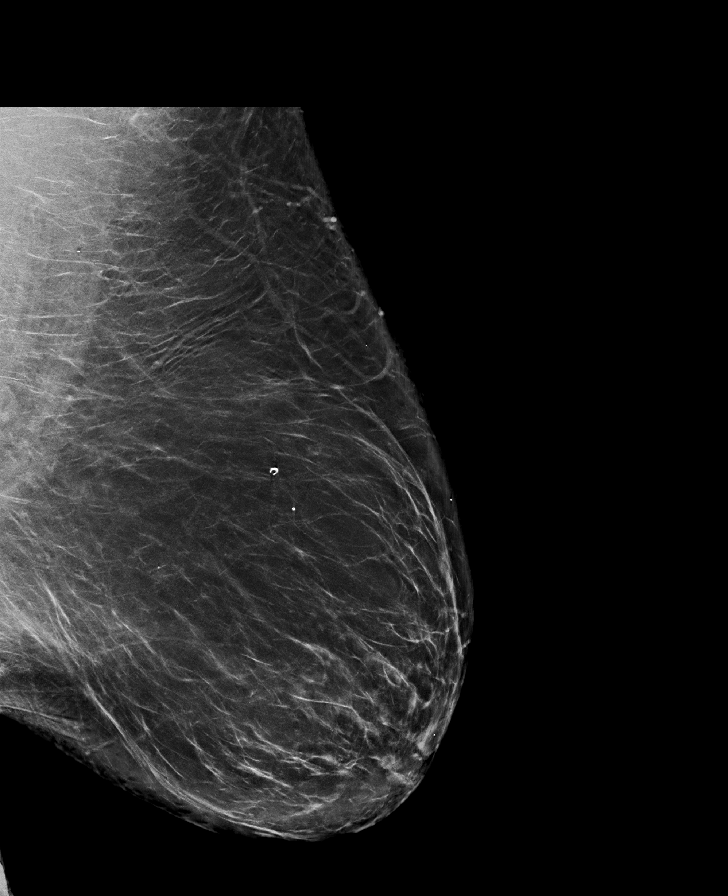

[L CC synth-2D (1 of 2)]
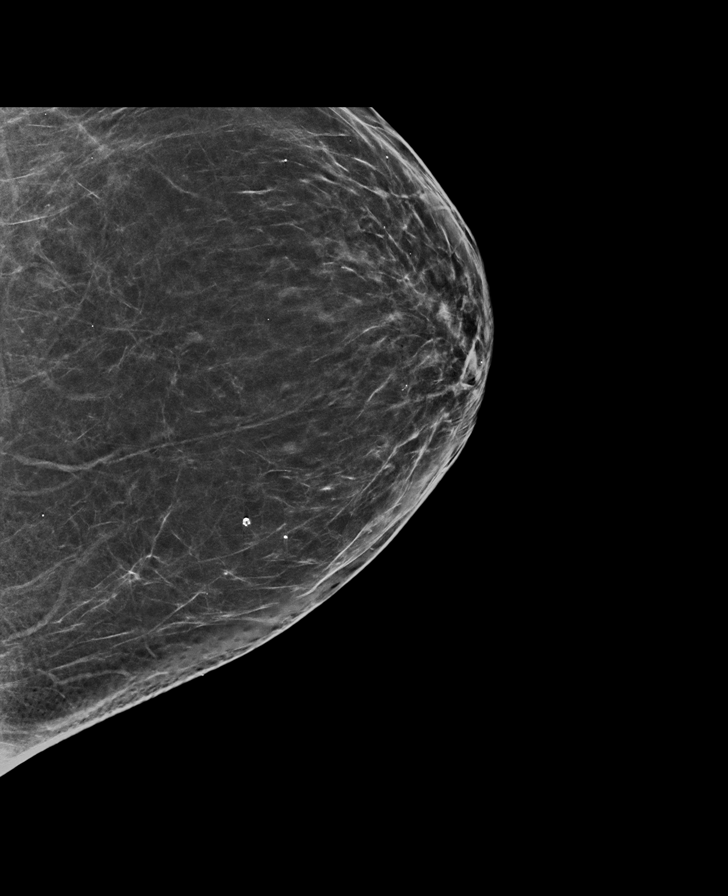

[R CC synth-2D (2 of 2)]
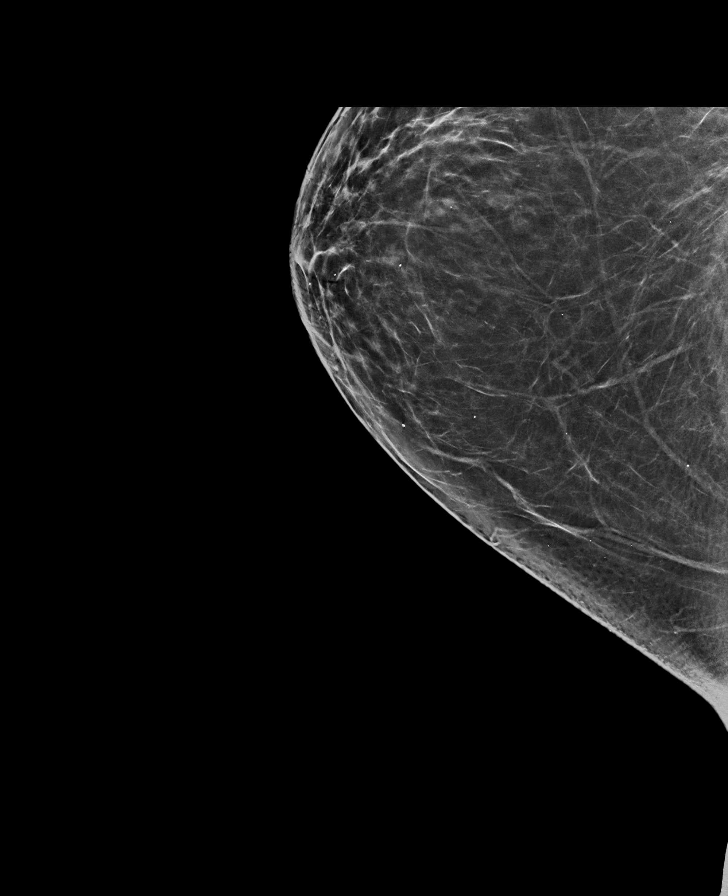

[L CC synth-2D (2 of 2)]
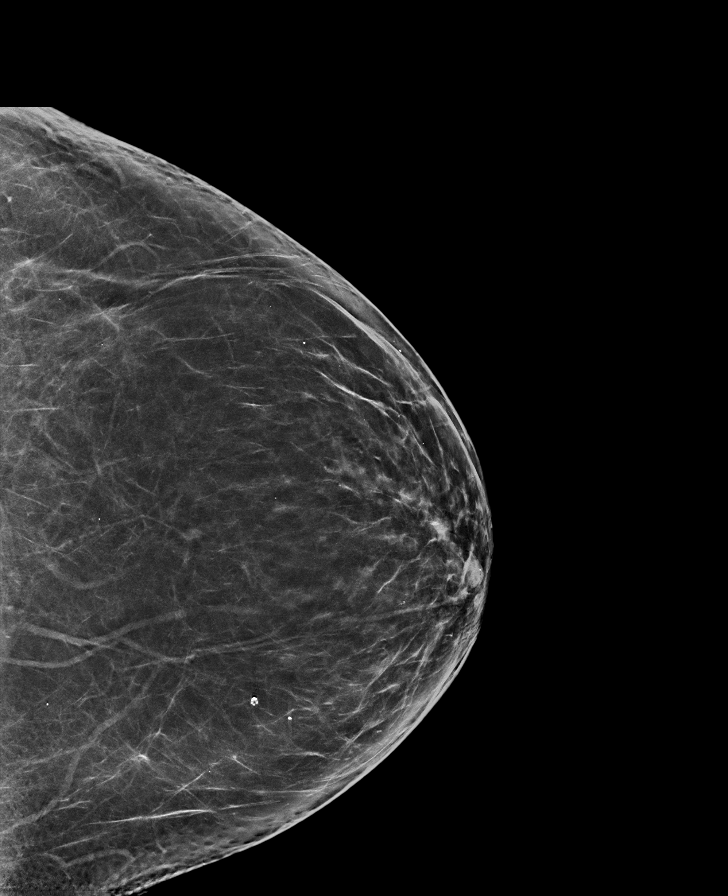

[R CC tomo · tomo slice 51/75.0]
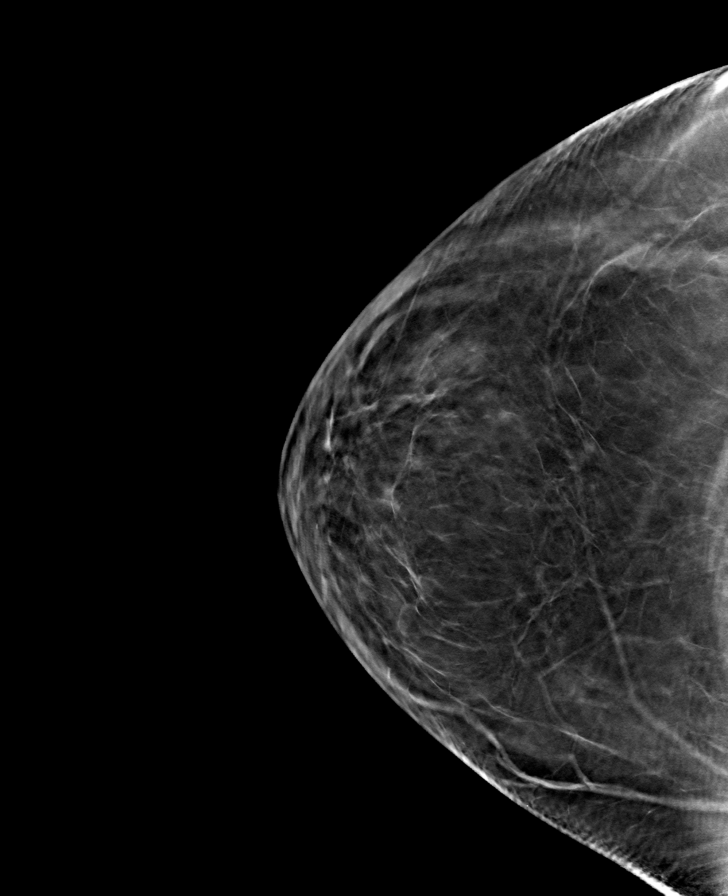

[8 of 40 positions shown; findings below may reference images not displayed]

ACR Breast Density Category b: There are scattered areas of
fibroglandular density.
FINDINGS: There are no findings suspicious for malignancy.
IMPRESSION: No mammographic evidence of malignancy. A result letter of this
screening mammogram will be mailed directly to the patient.

RECOMMENDATION:
Screening mammogram in one year. (Code:51-O-LD2)

BI-RADS CATEGORY  1: Negative.

## 2022-07-21 ENCOUNTER — Other Ambulatory Visit: Payer: Self-pay | Admitting: Family Medicine

## 2022-07-21 DIAGNOSIS — E1169 Type 2 diabetes mellitus with other specified complication: Secondary | ICD-10-CM

## 2022-08-01 ENCOUNTER — Encounter: Admitting: Gastroenterology

## 2022-08-06 ENCOUNTER — Other Ambulatory Visit: Payer: Self-pay | Admitting: Family Medicine

## 2022-08-06 DIAGNOSIS — I10 Essential (primary) hypertension: Secondary | ICD-10-CM

## 2022-08-15 ENCOUNTER — Other Ambulatory Visit: Payer: Self-pay | Admitting: Family Medicine

## 2022-08-17 NOTE — Assessment & Plan Note (Signed)
Hydrate and monitor 

## 2022-08-17 NOTE — Assessment & Plan Note (Signed)
Encouraged DASH or MIND diet, decrease po intake and increase exercise as tolerated. Needs 7-8 hours of sleep nightly. Avoid trans fats, eat small, frequent meals every 4-5 hours with lean proteins, complex carbs and healthy fats. Minimize simple carbs, high fat foods and processed foods 

## 2022-08-17 NOTE — Assessment & Plan Note (Signed)
Encourage heart healthy diet such as MIND or DASH diet, increase exercise, avoid trans fats, simple carbohydrates and processed foods, consider a krill or fish or flaxseed oil cap daily. Tolerating Atorvastatin 

## 2022-08-17 NOTE — Assessment & Plan Note (Signed)
Uses Lorazepam sparingly with good results

## 2022-08-17 NOTE — Assessment & Plan Note (Signed)
hgba1c acceptable, minimize simple carbs. Increase exercise as tolerated. Continue current meds 

## 2022-08-17 NOTE — Assessment & Plan Note (Signed)
Well controlled, no changes to meds. Encouraged heart healthy diet such as the DASH diet and exercise as tolerated.  °

## 2022-08-18 ENCOUNTER — Ambulatory Visit: Admitting: Family Medicine

## 2022-08-18 DIAGNOSIS — E782 Mixed hyperlipidemia: Secondary | ICD-10-CM

## 2022-08-18 DIAGNOSIS — E1169 Type 2 diabetes mellitus with other specified complication: Secondary | ICD-10-CM | POA: Diagnosis not present

## 2022-08-18 DIAGNOSIS — F419 Anxiety disorder, unspecified: Secondary | ICD-10-CM

## 2022-08-18 DIAGNOSIS — R252 Cramp and spasm: Secondary | ICD-10-CM

## 2022-08-18 DIAGNOSIS — E669 Obesity, unspecified: Secondary | ICD-10-CM

## 2022-08-18 DIAGNOSIS — I10 Essential (primary) hypertension: Secondary | ICD-10-CM

## 2022-08-18 LAB — COMPREHENSIVE METABOLIC PANEL
ALT: 15 U/L (ref 0–35)
AST: 16 U/L (ref 0–37)
Albumin: 4 g/dL (ref 3.5–5.2)
Alkaline Phosphatase: 62 U/L (ref 39–117)
BUN: 22 mg/dL (ref 6–23)
CO2: 25 mEq/L (ref 19–32)
Calcium: 9.2 mg/dL (ref 8.4–10.5)
Chloride: 108 mEq/L (ref 96–112)
Creatinine, Ser: 1.39 mg/dL — ABNORMAL HIGH (ref 0.40–1.20)
GFR: 43.76 mL/min — ABNORMAL LOW (ref >60.00)
Glucose, Bld: 88 mg/dL (ref 70–99)
Potassium: 4.8 mEq/L (ref 3.5–5.1)
Sodium: 138 mEq/L (ref 135–145)
Total Bilirubin: 0.5 mg/dL (ref 0.2–1.2)
Total Protein: 6.7 g/dL (ref 6.0–8.3)

## 2022-08-18 LAB — LIPID PANEL
Cholesterol: 101 mg/dL (ref 0–200)
HDL: 20.8 mg/dL — ABNORMAL LOW (ref >39.00)
LDL Cholesterol: 48 mg/dL (ref 0–99)
NonHDL: 80.14
Total CHOL/HDL Ratio: 5
Triglycerides: 163 mg/dL — ABNORMAL HIGH (ref 0.0–149.0)
VLDL: 32.6 mg/dL (ref 0.0–40.0)

## 2022-08-18 LAB — CBC
HCT: 36.6 % (ref 36.0–46.0)
Hemoglobin: 11.9 g/dL — ABNORMAL LOW (ref 12.0–15.0)
MCHC: 32.4 g/dL (ref 30.0–36.0)
MCV: 93.7 fl (ref 78.0–100.0)
Platelets: 293 10*3/uL (ref 150.0–400.0)
RBC: 3.91 Mil/uL (ref 3.87–5.11)
RDW: 13 % (ref 11.5–15.5)
WBC: 7.7 10*3/uL (ref 4.0–10.5)

## 2022-08-18 LAB — HEMOGLOBIN A1C: Hgb A1c MFr Bld: 6.7 % — ABNORMAL HIGH (ref 4.6–6.5)

## 2022-08-18 LAB — MAGNESIUM: Magnesium: 1.4 mg/dL — ABNORMAL LOW (ref 1.5–2.5)

## 2022-08-18 LAB — TSH: TSH: 1.26 u[IU]/mL (ref 0.35–5.50)

## 2022-08-18 NOTE — Progress Notes (Signed)
Subjective:    Patient ID: April Warren, female    DOB: June 11, 1971, 51 y.o.   MRN: [id number]  Chief Complaint  Patient presents with   Follow-up    4 month    HPI Patient is in today for follow up on chronic medical concerns. No recent febrile illness or acute hospitalizations.  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  She notes her blood sugars have been well controlled.  No complaints of polyuria or polydipsia. Denies CP/palp/SOB/HA/congestion/fevers/GI or GU c/o. Taking meds as prescribed   Past Medical History:  Diagnosis Date   Allergy    Asthma    Certain time of the year   Chicken pox 51 yrs old   Diabetes mellitus 08/12/2012   type 2- dr Zigmund Daniel diagnosed   Diabetic retinopathy ([place])    Dysmenorrhea 08/20/2012   HTN (hypertension) 08/20/2012   Hyperlipidemia, mixed 08/28/2013   Neuromuscular disorder ([place])    Not sure of date   Neuropathy 02/16/2017   Obesity    Obesity, unspecified 08/28/2013   Other and unspecified hyperlipidemia 08/28/2013   Preventative health care 08/20/2012   Sinusitis 11/14/2013   Sinusitis, acute 05/02/2016    Past Surgical History:  Procedure Laterality Date   EYE SURGERY     Laser surgery for bleeds   REFRACTIVE SURGERY  08/12/2012   right, left on 09/16/12    Family History  Problem Relation Age of Onset   Cancer Mother        breast   Diabetes Mother        type 2   Hypertension Mother    Hypertension Father    Hyperlipidemia Father    COPD Father    Arthritis Father    Asthma Father    Hypertension Maternal Grandmother    Hyperlipidemia Maternal Grandmother    COPD Maternal Grandmother    Asthma Maternal Grandmother    Hypertension Maternal Grandfather    Heart attack Maternal Grandfather    Stroke Maternal Grandfather    Gout Brother    Cancer Brother        lung   Heart disease Paternal Grandfather    Alcohol abuse Paternal  Grandfather     Social History   Socioeconomic History   Marital status: Married    Spouse name: Not on file   Number of children: Not on file   Years of education: Not on file   Highest education level: Not on file  Occupational History   Not on file  Tobacco Use   Smoking status: Never   Smokeless tobacco: Never  Substance and Sexual Activity   Alcohol use: Yes    Comment: One or two a year   Drug use: No   Sexual activity: Yes    Partners: Male    Birth control/protection: None  Other Topics Concern   Not on file  Social History Narrative   Lives with husband.    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    Not exercising   Social Determinants of Health   Financial Resource Strain: Not on file  Food Insecurity: Not on file  Transportation Needs: Not on file  Physical Activity: Not on file  Stress: Not on file  Social Connections: Not on file  Intimate Partner Violence: Not on file  Outpatient Medications Prior to Visit  Medication Sig Dispense Refill   albuterol (PROAIR HFA) 108 (90 Base) MCG/ACT inhaler USE 2 INHALATIONS EVERY 6 HOURS AS NEEDED FOR WHEEZING OR SHORTNESS OF BREATH 18 g 5   atorvastatin (LIPITOR) 80 MG tablet TAKE 1 TABLET DAILY 90 tablet 3   Blood Glucose Monitoring Suppl (FREESTYLE FREEDOM LITE) w/Device KIT Check blood sugar twice daily 1 each 0   cetirizine (ZYRTEC) 10 MG tablet TAKE 1 TABLET DAILY AS NEEDED FOR ALLERGIES 90 tablet 3   Dulaglutide (TRULICITY) 4.5 MA/0.0KH SOPN Inject 4.5 mg as directed once a week. 6 mL 1   fenofibrate micronized (LOFIBRA) 134 MG capsule TAKE 1 CAPSULE DAILY BEFORE BREAKFAST 30 capsule 11   gabapentin (NEURONTIN) 300 MG capsule TAKE 1 CAPSULE TWICE A DAY AND 2 CAPSULES AT BEDTIME 360 capsule 3   glipiZIDE (GLUCOTROL) 10 MG tablet TAKE ONE-HALF (1/2) TABLET TWICE A DAY BEFORE MEALS 90 tablet 3   glucose blood  test strip Use as instructed 100 each 12   ibuprofen (ADVIL,MOTRIN) 200 MG tablet Take 400 mg by mouth every 8 (eight) hours as needed.     Lancets (FREESTYLE) lancets Check blood sugar twice daily 200 each 5   lisinopril (ZESTRIL) 20 MG tablet TAKE 1 TABLET TWICE A DAY 180 tablet 3   LORazepam (ATIVAN) 1 MG tablet TAKE ONE-HALF (1/2) TABLET TWICE A DAY AS NEEDED FOR ANXIETY 90 tablet 1   metFORMIN (GLUCOPHAGE-XR) 750 MG 24 hr tablet TAKE 1 TABLET THREE TIMES A DAY 270 tablet 3   metoprolol tartrate (LOPRESSOR) 50 MG tablet TAKE 1 TABLET TWICE A DAY 180 tablet 3   montelukast (SINGULAIR) 10 MG tablet TAKE 1 TABLET AT BEDTIME 90 tablet 3   sitaGLIPtin (JANUVIA) 100 MG tablet TAKE 1 TABLET DAILY 90 tablet 1   triamcinolone (NASACORT) 55 MCG/ACT AERO nasal inhaler Place 2 sprays into the nose daily as needed. 1 each 5   triamterene-hydrochlorothiazide (MAXZIDE-25) 37.5-25 MG tablet TAKE 1 TABLET DAILY 90 tablet 3   Vitamin D, Ergocalciferol, (DRISDOL) 1.25 MG (50000 UNIT) CAPS capsule TAKE 1 CAPSULE EVERY 7 DAYS 12 capsule 3   No facility-administered medications prior to visit.    No Known Allergies  Review of Systems  Constitutional:  Negative for fever and malaise/fatigue.  HENT:  Negative for congestion.   Eyes:  Negative for blurred vision.  Respiratory:  Negative for shortness of breath.   Cardiovascular:  Negative for chest pain, palpitations and leg swelling.  Gastrointestinal:  Negative for abdominal pain, blood in stool and nausea.  Genitourinary:  Negative for dysuria and frequency.  Musculoskeletal:  Negative for falls.  Skin:  Negative for rash.  Neurological:  Negative for dizziness, loss of consciousness and headaches.  Endo/Heme/Allergies:  Negative for environmental allergies.  Psychiatric/Behavioral:  Negative for depression. The patient is not nervous/anxious.        Objective:    Physical Exam Constitutional:      General: She is not in acute distress.     Appearance: She is well-developed. She is obese.  HENT:     Head: Normocephalic and atraumatic.  Eyes:     Conjunctiva/sclera: Conjunctivae normal.  Neck:     Thyroid: No thyromegaly.  Cardiovascular:     Rate and Rhythm: Normal rate and regular rhythm.     Heart sounds: Normal heart sounds. No murmur heard. Pulmonary:     Effort: Pulmonary effort is normal. No respiratory distress.     Breath sounds: Normal breath  sounds.  Abdominal:     General: Bowel sounds are normal. There is no distension.     Palpations: Abdomen is soft. There is no mass.     Tenderness: There is no abdominal tenderness.  Musculoskeletal:     Cervical back: Neck supple.  Lymphadenopathy:     Cervical: No cervical adenopathy.  Skin:    General: Skin is warm and dry.  Neurological:     Mental Status: She is alert and oriented to person, place, and time.  Psychiatric:        Behavior: Behavior normal.     BP 134/80   Pulse 74   Temp 98.3 F (36.8 C)   Resp 18   Ht 5' 10"  (1.778 m)   Wt 278 lb 1.6 oz (126.1 kg)   SpO2 98%   BMI 39.90 kg/m  Wt Readings from Last 3 Encounters:  08/18/22 278 lb 1.6 oz (126.1 kg)  04/15/22 278 lb 9.6 oz (126.4 kg)  01/13/22 278 lb (126.1 kg)    Diabetic Foot Exam - Simple   No data filed    Lab Results  Component Value Date   WBC 9.3 04/15/2022   HGB 11.7 (L) 04/15/2022   HCT 35.7 (L) 04/15/2022   PLT 333.0 04/15/2022   GLUCOSE 156 (H) 06/05/2022   CHOL 106 04/15/2022   TRIG 257.0 (H) 04/15/2022   HDL 21.20 (L) 04/15/2022   LDLDIRECT 51.0 04/15/2022   LDLCALC 39 09/11/2020   ALT 18 06/05/2022   AST 17 06/05/2022   NA 140 06/05/2022   K 4.7 06/05/2022   CL 105 06/05/2022   CREATININE 1.19 06/05/2022   BUN 21 06/05/2022   CO2 28 06/05/2022   TSH 1.09 04/15/2022   INR 1.03 03/04/2011   HGBA1C 6.2 04/15/2022   MICROALBUR 1.6 04/15/2022    Lab Results  Component Value Date   TSH 1.09 04/15/2022   Lab Results  Component Value Date   WBC 9.3  04/15/2022   HGB 11.7 (L) 04/15/2022   HCT 35.7 (L) 04/15/2022   MCV 91.5 04/15/2022   PLT 333.0 04/15/2022   Lab Results  Component Value Date   NA 140 06/05/2022   K 4.7 06/05/2022   CO2 28 06/05/2022   GLUCOSE 156 (H) 06/05/2022   BUN 21 06/05/2022   CREATININE 1.19 06/05/2022   BILITOT 0.5 06/05/2022   ALKPHOS 68 06/05/2022   AST 17 06/05/2022   ALT 18 06/05/2022   PROT 6.4 06/05/2022   ALBUMIN 3.9 06/05/2022   CALCIUM 9.2 06/05/2022   GFR 52.81 (L) 06/05/2022   Lab Results  Component Value Date   CHOL 106 04/15/2022   Lab Results  Component Value Date   HDL 21.20 (L) 04/15/2022   Lab Results  Component Value Date   LDLCALC 39 09/11/2020   Lab Results  Component Value Date   TRIG 257.0 (H) 04/15/2022   Lab Results  Component Value Date   CHOLHDL 5 04/15/2022   Lab Results  Component Value Date   HGBA1C 6.2 04/15/2022       Assessment & Plan:   Problem List Items Addressed This Visit     Diabetes mellitus type 2 in obese ([place])    hgba1c acceptable, minimize simple carbs. Increase exercise as tolerated. Continue current meds      Relevant Orders   Hemoglobin A1c   Obesity    Encouraged DASH or MIND diet, decrease po intake and increase exercise as tolerated. Needs 7-8 hours of sleep nightly.  Avoid trans fats, eat small, frequent meals every 4-5 hours with lean proteins, complex carbs and healthy fats. Minimize simple carbs, high fat foods and processed foods      HTN (hypertension)    Well controlled, no changes to meds. Encouraged heart healthy diet such as the DASH diet and exercise as tolerated.       Relevant Orders   CBC   Comprehensive metabolic panel   TSH   Hyperlipidemia, mixed    Encourage heart healthy diet such as MIND or DASH diet, increase exercise, avoid trans fats, simple carbohydrates and processed foods, consider a krill or fish or flaxseed oil cap daily. Tolerating Atorvastatin      Relevant Orders   Lipid panel    Muscle cramps    Hydrate and monitor      Relevant Orders   Magnesium   Anxiety    Uses Lorazepam sparingly with good results       I am having April Warren maintain her ibuprofen, FreeStyle Freedom Lite, freestyle, glucose blood, triamcinolone, metFORMIN, ProAir HFA, montelukast, Vitamin D (Ergocalciferol), atorvastatin, Trulicity, fenofibrate micronized, LORazepam, gabapentin, cetirizine, glipiZIDE, triamterene-hydrochlorothiazide, metoprolol tartrate, lisinopril, and Januvia.  No orders of the defined types were placed in this encounter.    Penni Homans, MD

## 2022-08-18 NOTE — Patient Instructions (Signed)

## 2022-08-19 ENCOUNTER — Telehealth: Payer: Self-pay | Admitting: *Deleted

## 2022-08-19 NOTE — Telephone Encounter (Signed)
Med list updated and future lab order placed per lab result note of 08/18/21.

## 2022-08-26 ENCOUNTER — Other Ambulatory Visit (INDEPENDENT_AMBULATORY_CARE_PROVIDER_SITE_OTHER)

## 2022-08-26 LAB — MAGNESIUM: Magnesium: 1.6 mg/dL (ref 1.5–2.5)

## 2022-10-06 ENCOUNTER — Other Ambulatory Visit: Payer: Self-pay | Admitting: Family Medicine

## 2022-11-04 ENCOUNTER — Other Ambulatory Visit (HOSPITAL_BASED_OUTPATIENT_CLINIC_OR_DEPARTMENT_OTHER): Payer: Self-pay | Admitting: Family Medicine

## 2022-11-04 DIAGNOSIS — Z1231 Encounter for screening mammogram for malignant neoplasm of breast: Secondary | ICD-10-CM

## 2022-11-11 ENCOUNTER — Inpatient Hospital Stay (HOSPITAL_BASED_OUTPATIENT_CLINIC_OR_DEPARTMENT_OTHER): Admission: RE | Admit: 2022-11-11 | Source: Ambulatory Visit

## 2022-11-12 ENCOUNTER — Encounter (HOSPITAL_BASED_OUTPATIENT_CLINIC_OR_DEPARTMENT_OTHER): Payer: Self-pay

## 2022-11-12 ENCOUNTER — Ambulatory Visit (HOSPITAL_BASED_OUTPATIENT_CLINIC_OR_DEPARTMENT_OTHER)
Admission: RE | Admit: 2022-11-12 | Discharge: 2022-11-12 | Disposition: A | Discharge status: Home / Self Care | Source: Ambulatory Visit | Attending: Family Medicine | Admitting: Family Medicine

## 2022-11-12 DIAGNOSIS — Z1231 Encounter for screening mammogram for malignant neoplasm of breast: Secondary | ICD-10-CM | POA: Diagnosis present

## 2022-11-13 ENCOUNTER — Other Ambulatory Visit: Payer: Self-pay | Admitting: Family Medicine

## 2022-12-24 NOTE — Assessment & Plan Note (Signed)
Encourage heart healthy diet such as MIND or DASH diet, increase exercise, avoid trans fats, simple carbohydrates and processed foods, consider a krill or fish or flaxseed oil cap daily. Tolerating Atorvastatin 

## 2022-12-24 NOTE — Assessment & Plan Note (Signed)
Hydrate and monitor 

## 2022-12-24 NOTE — Assessment & Plan Note (Signed)
Well controlled, no changes to meds. Encouraged heart healthy diet such as the DASH diet and exercise as tolerated.  °

## 2022-12-24 NOTE — Assessment & Plan Note (Signed)
hgba1c acceptable, minimize simple carbs. Increase exercise as tolerated. Continue current meds 

## 2022-12-24 NOTE — Assessment & Plan Note (Signed)
Encouraged DASH or MIND diet, decrease po intake and increase exercise as tolerated. Needs 7-8 hours of sleep nightly. Avoid trans fats, eat small, frequent meals every 4-5 hours with lean proteins, complex carbs and healthy fats. Minimize simple carbs, high fat foods and processed foods 

## 2022-12-25 ENCOUNTER — Ambulatory Visit: Admitting: Family Medicine

## 2022-12-25 VITALS — BP 118/78 | HR 67 | Temp 97.6°F | Resp 18 | Ht 70.0 in | Wt 276.0 lb

## 2022-12-25 DIAGNOSIS — E782 Mixed hyperlipidemia: Secondary | ICD-10-CM | POA: Diagnosis not present

## 2022-12-25 DIAGNOSIS — E669 Obesity, unspecified: Secondary | ICD-10-CM | POA: Diagnosis not present

## 2022-12-25 DIAGNOSIS — E1169 Type 2 diabetes mellitus with other specified complication: Secondary | ICD-10-CM | POA: Diagnosis not present

## 2022-12-25 DIAGNOSIS — I10 Essential (primary) hypertension: Secondary | ICD-10-CM

## 2022-12-25 DIAGNOSIS — R252 Cramp and spasm: Secondary | ICD-10-CM | POA: Diagnosis not present

## 2022-12-25 DIAGNOSIS — Z23 Encounter for immunization: Secondary | ICD-10-CM | POA: Diagnosis not present

## 2022-12-25 DIAGNOSIS — J069 Acute upper respiratory infection, unspecified: Secondary | ICD-10-CM

## 2022-12-25 LAB — CBC WITH DIFFERENTIAL/PLATELET
Basophils Absolute: 0.1 10*3/uL (ref 0.0–0.1)
Basophils Relative: 0.9 % (ref 0.0–3.0)
Eosinophils Absolute: 0.3 10*3/uL (ref 0.0–0.7)
Eosinophils Relative: 3.7 % (ref 0.0–5.0)
HCT: 36.9 % (ref 36.0–46.0)
Hemoglobin: 12 g/dL (ref 12.0–15.0)
Lymphocytes Relative: 32.5 % (ref 12.0–46.0)
Lymphs Abs: 2.5 10*3/uL (ref 0.7–4.0)
MCHC: 32.6 g/dL (ref 30.0–36.0)
MCV: 94.4 fl (ref 78.0–100.0)
Monocytes Absolute: 0.5 10*3/uL (ref 0.1–1.0)
Monocytes Relative: 6.9 % (ref 3.0–12.0)
Neutro Abs: 4.3 10*3/uL (ref 1.4–7.7)
Neutrophils Relative %: 56 % (ref 43.0–77.0)
Platelets: 349 10*3/uL (ref 150.0–400.0)
RBC: 3.91 Mil/uL (ref 3.87–5.11)
RDW: 13.5 % (ref 11.5–15.5)
WBC: 7.8 10*3/uL (ref 4.0–10.5)

## 2022-12-25 LAB — HEMOGLOBIN A1C: Hgb A1c MFr Bld: 6.1 % (ref 4.6–6.5)

## 2022-12-25 LAB — COMPREHENSIVE METABOLIC PANEL
ALT: 16 U/L (ref 0–35)
AST: 20 U/L (ref 0–37)
Albumin: 4.1 g/dL (ref 3.5–5.2)
Alkaline Phosphatase: 61 U/L (ref 39–117)
BUN: 26 mg/dL — ABNORMAL HIGH (ref 6–23)
CO2: 26 mEq/L (ref 19–32)
Calcium: 9.2 mg/dL (ref 8.4–10.5)
Chloride: 105 mEq/L (ref 96–112)
Creatinine, Ser: 1.44 mg/dL — ABNORMAL HIGH (ref 0.40–1.20)
GFR: 41.84 mL/min — ABNORMAL LOW (ref >60.00)
Glucose, Bld: 80 mg/dL (ref 70–99)
Potassium: 4.7 mEq/L (ref 3.5–5.1)
Sodium: 138 mEq/L (ref 135–145)
Total Bilirubin: 0.5 mg/dL (ref 0.2–1.2)
Total Protein: 7 g/dL (ref 6.0–8.3)

## 2022-12-25 LAB — LIPID PANEL
Cholesterol: 119 mg/dL (ref 0–200)
HDL: 22.4 mg/dL — ABNORMAL LOW (ref >39.00)
NonHDL: 96.92
Total CHOL/HDL Ratio: 5
Triglycerides: 208 mg/dL — ABNORMAL HIGH (ref 0.0–149.0)
VLDL: 41.6 mg/dL — ABNORMAL HIGH (ref 0.0–40.0)

## 2022-12-25 LAB — MAGNESIUM: Magnesium: 1.3 mg/dL — ABNORMAL LOW (ref 1.5–2.5)

## 2022-12-25 LAB — LDL CHOLESTEROL, DIRECT: Direct LDL: 67 mg/dL

## 2022-12-25 MED ORDER — METHYLPREDNISOLONE 4 MG PO TABS: ORAL_TABLET | ORAL | 0 refills | Status: DC

## 2022-12-25 MED ORDER — AZITHROMYCIN 250 MG PO TABS: ORAL_TABLET | ORAL | 0 refills | Status: AC

## 2022-12-25 MED ORDER — FENOFIBRATE MICRONIZED 134 MG PO CAPS: ORAL_CAPSULE | ORAL | 3 refills | Status: DC

## 2022-12-25 MED ORDER — FLUCONAZOLE 150 MG PO TABS: 150.0000 mg | ORAL_TABLET | ORAL | 0 refills | Status: DC

## 2022-12-25 NOTE — Progress Notes (Signed)
Subjective:   By signing my name below, I, Kellie Simmering, attest that this documentation has been prepared under the direction and in the presence of Mosie Lukes, MD., 12/25/2022.    Patient ID: April Warren, female    DOB: November 03, 1971, 51 y.o.   MRN: [id number]  Chief Complaint  Patient presents with   Diabetes   Hypertension   Hyperlipidemia   Follow-up   Diabetes Pertinent negatives for hypoglycemia include no headaches. Pertinent negatives for diabetes include no chest pain.  Hypertension Pertinent negatives include no chest pain, headaches, palpitations or shortness of breath.  Hyperlipidemia Pertinent negatives include no chest pain or shortness of breath.   Congestion/Cough Patient is in today for an office visit and presents with congestion/cough/ear pain/rhinorrhea/sore throat which she has been experiencing since Thanksgiving. She denies CP/palpitations/SOB/ HA/fevers/chills/GI or GU c/o. She has been using a nasal flush and taking Robitussin for diabetics and high blood pressure which has provided temporary relief. She has tested negative for COVID-19 and Influenza.   Past Medical History:  Diagnosis Date   Allergy    Asthma    Certain time of the year   Chicken pox 51 yrs old   Diabetes mellitus 08/12/2012   type 2- dr Zigmund Daniel diagnosed   Diabetic retinopathy ([place])    Dysmenorrhea 08/20/2012   HTN (hypertension) 08/20/2012   Hyperlipidemia, mixed 08/28/2013   Neuromuscular disorder ([place])    Not sure of date   Neuropathy 02/16/2017   Obesity    Obesity, unspecified 08/28/2013   Other and unspecified hyperlipidemia 08/28/2013   Preventative health care 08/20/2012   Sinusitis 11/14/2013   Sinusitis, acute 05/02/2016    Past Surgical History:  Procedure Laterality Date   EYE SURGERY     Laser surgery for bleeds   REFRACTIVE SURGERY  08/12/2012   right, left on 09/16/12    Family History  Problem Relation Age of Onset   Cancer Mother        breast    Diabetes Mother        type 2   Hypertension Mother    Hypertension Father    Hyperlipidemia Father    COPD Father    Arthritis Father    Asthma Father    Hypertension Maternal Grandmother    Hyperlipidemia Maternal Grandmother    COPD Maternal Grandmother    Asthma Maternal Grandmother    Hypertension Maternal Grandfather    Heart attack Maternal Grandfather    Stroke Maternal Grandfather    Gout Brother    Cancer Brother        lung   Heart disease Paternal Grandfather    Alcohol abuse Paternal Grandfather     Social History   Socioeconomic History   Marital status: Married    Spouse name: Not on file   Number of children: Not on file   Years of education: Not on file   Highest education level: Not on file  Occupational History   Not on file  Tobacco Use   Smoking status: Never   Smokeless tobacco: Never  Substance and Sexual Activity   Alcohol use: Yes    Comment: One or two a year   Drug use: No   Sexual activity: Yes    Partners: Male    Birth control/protection: None  Other Topics Concern   Not on file  Social History Narrative   Lives with husband.    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    Not exercising   Social Determinants of Health   Financial Resource Strain: Not on file  Food Insecurity: Not on file  Transportation Needs: Not on file  Physical Activity: Not on file  Stress: Not on file  Social Connections: Not on file  Intimate Partner Violence: Not on file    Outpatient Medications Prior to Visit  Medication Sig Dispense Refill   albuterol (PROAIR HFA) 108 (90 Base) MCG/ACT inhaler USE 2 INHALATIONS EVERY 6 HOURS AS NEEDED FOR WHEEZING OR SHORTNESS OF BREATH 18 g 5   atorvastatin (LIPITOR) 80 MG tablet TAKE 1 TABLET DAILY 90 tablet 3   Blood Glucose Monitoring Suppl (FREESTYLE FREEDOM LITE) w/Device KIT Check blood sugar twice daily 1  each 0   cetirizine (ZYRTEC) 10 MG tablet TAKE 1 TABLET DAILY AS NEEDED FOR ALLERGIES 90 tablet 3   gabapentin (NEURONTIN) 300 MG capsule TAKE 1 CAPSULE TWICE A DAY AND 2 CAPSULES AT BEDTIME 360 capsule 3   glipiZIDE (GLUCOTROL) 10 MG tablet TAKE ONE-HALF (1/2) TABLET TWICE A DAY BEFORE MEALS 90 tablet 3   glucose blood test strip Use as instructed 100 each 12   ibuprofen (ADVIL,MOTRIN) 200 MG tablet Take 400 mg by mouth every 8 (eight) hours as needed.     JANUVIA 100 MG tablet TAKE 1 TABLET DAILY 90 tablet 3   Lancets (FREESTYLE) lancets Check blood sugar twice daily 200 each 5   lisinopril (ZESTRIL) 20 MG tablet TAKE 1 TABLET TWICE A DAY 180 tablet 3   LORazepam (ATIVAN) 1 MG tablet TAKE ONE-HALF (1/2) TABLET TWICE A DAY AS NEEDED FOR ANXIETY 90 tablet 1   Magnesium Oxide 400 MG CAPS Take 1 capsule by mouth daily. Pt is taking 875m capsule every other day.     metFORMIN (GLUCOPHAGE-XR) 750 MG 24 hr tablet TAKE 1 TABLET THREE TIMES A DAY 270 tablet 3   metoprolol tartrate (LOPRESSOR) 50 MG tablet TAKE 1 TABLET TWICE A DAY 180 tablet 3   montelukast (SINGULAIR) 10 MG tablet TAKE 1 TABLET AT BEDTIME 90 tablet 3   triamcinolone (NASACORT) 55 MCG/ACT AERO nasal inhaler Place 2 sprays into the nose daily as needed. 1 each 5   triamterene-hydrochlorothiazide (MAXZIDE-25) 37.5-25 MG tablet TAKE 1 TABLET DAILY 90 tablet 3   TRULICITY 4.5 MHA/1.9FXSOPN INJECT 4.5 MG AS DIRECTED ONCE A WEEK 6 mL 3   Vitamin D, Ergocalciferol, (DRISDOL) 1.25 MG (50000 UNIT) CAPS capsule TAKE 1 CAPSULE EVERY 7 DAYS 12 capsule 3   fenofibrate micronized (LOFIBRA) 134 MG capsule TAKE 1 CAPSULE DAILY BEFORE BREAKFAST 30 capsule 11   No facility-administered medications prior to visit.    No Known Allergies  Review of Systems  Constitutional:  Negative for chills and fever.  HENT:  Positive for congestion, ear pain and sore throat.   Respiratory:  Positive for cough. Negative for shortness of breath.    Cardiovascular:  Negative for chest pain and palpitations.  Gastrointestinal:  Negative for abdominal pain, blood in stool, constipation, diarrhea, nausea and vomiting.  Genitourinary:  Negative for dysuria, frequency, hematuria and urgency.  Skin:           Neurological:  Negative for headaches.      Objective:    Physical Exam Constitutional:      General: She is not in acute distress.    Appearance: Normal appearance. She is normal weight. She is not ill-appearing.  HENT:  Head: Normocephalic and atraumatic.     Right Ear: External ear normal.     Left Ear: External ear normal.     Nose: Nose normal.     Mouth/Throat:     Mouth: Mucous membranes are moist.     Pharynx: Oropharynx is clear.  Eyes:     General:        Right eye: No discharge.        Left eye: No discharge.     Extraocular Movements: Extraocular movements intact.     Conjunctiva/sclera: Conjunctivae normal.     Pupils: Pupils are equal, round, and reactive to light.  Cardiovascular:     Rate and Rhythm: Normal rate and regular rhythm.     Pulses: Normal pulses.     Heart sounds: Normal heart sounds. No murmur heard.    No gallop.  Pulmonary:     Effort: Pulmonary effort is normal. No respiratory distress.     Breath sounds: Normal breath sounds. No wheezing or rales.  Abdominal:     General: Bowel sounds are normal.     Palpations: Abdomen is soft.     Tenderness: There is no abdominal tenderness. There is no guarding.  Musculoskeletal:        General: Normal range of motion.     Cervical back: Normal range of motion.     Right lower leg: No edema.     Left lower leg: No edema.  Skin:    General: Skin is warm and dry.  Neurological:     Mental Status: She is alert and oriented to person, place, and time.  Psychiatric:        Mood and Affect: Mood normal.        Behavior: Behavior normal.        Judgment: Judgment normal.     BP 118/78 (BP Location: Left Arm, Patient Position: Sitting,  Cuff Size: Large)   Pulse 67   Temp 97.6 F (36.4 C) (Oral)   Resp 18   Ht _0  (1.778 m)   Wt 276 lb (125.2 kg)   SpO2 98%   BMI 39.60 kg/m  Wt Readings from Last 3 Encounters:  12/25/22 276 lb (125.2 kg)  08/18/22 278 lb 1.6 oz (126.1 kg)  04/15/22 278 lb 9.6 oz (126.4 kg)    Diabetic Foot Exam - Simple   No data filed    Lab Results  Component Value Date   WBC 7.8 12/25/2022   HGB 12.0 12/25/2022   HCT 36.9 12/25/2022   PLT 349.0 12/25/2022   GLUCOSE 80 12/25/2022   CHOL 119 12/25/2022   TRIG 208.0 (H) 12/25/2022   HDL 22.40 (L) 12/25/2022   LDLDIRECT 67.0 12/25/2022   LDLCALC 48 08/18/2022   ALT 16 12/25/2022   AST 20 12/25/2022   NA 138 12/25/2022   K 4.7 12/25/2022   CL 105 12/25/2022   CREATININE 1.44 (H) 12/25/2022   BUN 26 (H) 12/25/2022   CO2 26 12/25/2022   TSH 0.91 12/25/2022   INR 1.03 03/04/2011   HGBA1C 6.1 12/25/2022   MICROALBUR 1.6 04/15/2022    Lab Results  Component Value Date   TSH 0.91 12/25/2022   Lab Results  Component Value Date   WBC 7.8 12/25/2022   HGB 12.0 12/25/2022   HCT 36.9 12/25/2022   MCV 94.4 12/25/2022   PLT 349.0 12/25/2022   Lab Results  Component Value Date   NA 138 12/25/2022   K 4.7 12/25/2022  CO2 26 12/25/2022   GLUCOSE 80 12/25/2022   BUN 26 (H) 12/25/2022   CREATININE 1.44 (H) 12/25/2022   BILITOT 0.5 12/25/2022   ALKPHOS 61 12/25/2022   AST 20 12/25/2022   ALT 16 12/25/2022   PROT 7.0 12/25/2022   ALBUMIN 4.1 12/25/2022   CALCIUM 9.2 12/25/2022   GFR 41.84 (L) 12/25/2022   Lab Results  Component Value Date   CHOL 119 12/25/2022   Lab Results  Component Value Date   HDL 22.40 (L) 12/25/2022   Lab Results  Component Value Date   LDLCALC 48 08/18/2022   Lab Results  Component Value Date   TRIG 208.0 (H) 12/25/2022   Lab Results  Component Value Date   CHOLHDL 5 12/25/2022   Lab Results  Component Value Date   HGBA1C 6.1 12/25/2022      Assessment & Plan:   Congestion/Cough: Prescribed Azithromycin, Fluconazole, and Methylprednisolone.   Immunizations: Encouraged patient to receive Influenza, Prevnar 20, Shingles, and Tetanus immunizations. Influenza and Prevnar 20 immunizations administered today.  Labs: Routine blood work will be completed today. Problem List Items Addressed This Visit     Diabetes mellitus type 2 in obese ([place]) - Primary    hgba1c acceptable, minimize simple carbs. Increase exercise as tolerated. Continue current meds      Relevant Orders   Hemoglobin A1c (Completed)   Obesity    Encouraged DASH or MIND diet, decrease po intake and increase exercise as tolerated. Needs 7-8 hours of sleep nightly. Avoid trans fats, eat small, frequent meals every 4-5 hours with lean proteins, complex carbs and healthy fats. Minimize simple carbs, high fat foods and processed foods      HTN (hypertension)    Well controlled, no changes to meds. Encouraged heart healthy diet such as the DASH diet and exercise as tolerated.       Relevant Medications   fenofibrate micronized (LOFIBRA) 134 MG capsule   Other Relevant Orders   CBC with Differential/Platelet (Completed)   Comprehensive metabolic panel (Completed)   TSH (Completed)   Hyperlipidemia, mixed    Encourage heart healthy diet such as MIND or DASH diet, increase exercise, avoid trans fats, simple carbohydrates and processed foods, consider a krill or fish or flaxseed oil cap daily. Tolerating Atorvastatin      Relevant Medications   fenofibrate micronized (LOFIBRA) 134 MG capsule   Other Relevant Orders   Lipid panel (Completed)   Muscle cramps    Hydrate and monitor      Relevant Orders   Magnesium (Completed)   URI (upper respiratory infection)    Given rx for medrol and zpak to use if symptoms worsen. Encouraged increased rest and hydration, add probiotics, zinc such as Coldeze or Xicam. Treat fevers as needed       Relevant Medications   azithromycin (ZITHROMAX)  250 MG tablet   fluconazole (DIFLUCAN) 150 MG tablet   Other Visit Diagnoses     Need for influenza vaccination       Relevant Orders   Flu Vaccine QUAD 6+ mos PF IM (Fluarix Quad PF) (Completed)   Need for pneumococcal 20-valent conjugate vaccination       Relevant Orders   Pneumococcal conjugate vaccine 20-valent (Prevnar 20) (Completed)      Meds ordered this encounter  Medications   azithromycin (ZITHROMAX) 250 MG tablet    Sig: Take 2 tablets on day 1, then 1 tablet daily on days 2 through 5    Dispense:  6 tablet  Refill:  0   methylPREDNISolone (MEDROL) 4 MG tablet    Sig: 5 tabs po x 1 day then 4 tabs po x 1 day then 3 tabs po x 1 day then 2 tabs po x 1 day then 1 tab po x 1 day and stop    Dispense:  50 tablet    Refill:  0   fluconazole (DIFLUCAN) 150 MG tablet    Sig: Take 1 tablet (150 mg total) by mouth once a week.    Dispense:  1 tablet    Refill:  0   fenofibrate micronized (LOFIBRA) 134 MG capsule    Sig: TAKE 1 CAPSULE DAILY BEFORE BREAKFAST    Dispense:  90 capsule    Refill:  3   I, Penni Homans, MD, personally preformed the services described in this documentation.  All medical record entries made by the scribe were at my direction and in my presence.  I have reviewed the chart and discharge instructions (if applicable) and agree that the record reflects my personal performance and is accurate and complete. 12/25/2022  I,Mohammed Iqbal,acting as a scribe for Penni Homans, MD.,have documented all relevant documentation on the behalf of Penni Homans, MD,as directed by  Penni Homans, MD while in the presence of Penni Homans, MD.  Penni Homans, MD

## 2022-12-25 NOTE — Patient Instructions (Signed)
Sinus Pain  Sinus pain may occur when your sinuses become clogged or swollen. Sinuses are air-filled spaces in your skull that are behind the bones of your face and forehead. Sinus pain can range from mild to severe. What are the causes? Sinus pain can result from various conditions that affect the sinuses. Common causes include: Colds. Sinus infections. Allergies. What are the signs or symptoms? The main symptom of this condition is pain or pressure in your face, forehead, ears, or upper teeth. People who have sinus pain often have other symptoms, such as: Congested or runny nose. Fever. Inability to smell. Headache. Weather changes can make symptoms worse. How is this diagnosed? Your health care provider will diagnose this condition based on your symptoms and a physical exam. If you have pain that keeps coming back or does not go away, your health care provider may recommend more testing. This may include: Imaging tests, such as a CT scan or MRI, to check for problems with your sinuses. Examination of your sinuses using a thin tool with a camera that is inserted through your nose (endoscopy). How is this treated? Treatment for this condition depends on the cause. Sinus pain that is caused by a sinus infection may be treated with antibiotic medicine. Sinus pain that is caused by congestion may be helped by rinsing out (flushing) the nose and sinuses with saline solution. Sinus pain that is caused by allergies may be helped by allergy medicines (antihistamines) and medicated nasal sprays. Sinus surgery may be needed in some cases if other treatments do not help. Follow these instructions at home: General instructions If directed: Apply a warm, moist washcloth to your face to help relieve pain. Use a nasal saline wash. Follow the directions on the bottle or box. Hydrate and humidify Drink enough water to keep your urine clear or pale yellow. Staying hydrated will help to thin your  mucus. Use a humidifier if your home is dry. Inhale steam for 10-15 minutes, 3-4 times a day or as told by your health care provider. You can do this in the bathroom while a hot shower is running. Limit your exposure to cool or dry air. Medicines  Take over-the-counter and prescription medicines only as told by your health care provider. If you were prescribed an antibiotic medicine, take it as told by your health care provider. Do not stop taking the antibiotic even if you start to feel better. If you have congestion, use a nasal spray to help lessen pressure. Contact a health care provider if: You have sinus pain more than one time a week. You have sensitivity to light or sound. You develop a fever. You feel nauseous or you vomit. Your sinus pain or headache does not get better with treatment. Get help right away if: You have vision problems. You have sudden, severe pain in your face or head. You have a seizure. You are confused. You have a stiff neck. Summary Sinus pain occurs when your sinuses become clogged or swollen. Sinus pain can result from various conditions that affect the sinuses, such as a cold, a sinus infection, or an allergy. Treatment for this condition depends on the cause. It may include medicine, such as antibiotics or antihistamines. This information is not intended to replace advice given to you by your health care provider. Make sure you discuss any questions you have with your health care provider. Document Revised: 11/17/2021 Document Reviewed: 11/17/2021 Elsevier Patient Education  2023 [employer]  

## 2022-12-26 ENCOUNTER — Encounter: Payer: Self-pay | Admitting: Family Medicine

## 2022-12-26 LAB — TSH: TSH: 0.91 u[IU]/mL (ref 0.35–5.50)

## 2022-12-27 DIAGNOSIS — J069 Acute upper respiratory infection, unspecified: Secondary | ICD-10-CM | POA: Insufficient documentation

## 2022-12-27 NOTE — Assessment & Plan Note (Signed)
Given rx for medrol and zpak to use if symptoms worsen. Encouraged increased rest and hydration, add probiotics, zinc such as Coldeze or Xicam. Treat fevers as needed

## 2022-12-30 MED ORDER — EZETIMIBE 10 MG PO TABS: 10.0000 mg | ORAL_TABLET | Freq: Every day | ORAL | 3 refills | Status: DC

## 2023-01-09 ENCOUNTER — Other Ambulatory Visit: Payer: Self-pay | Admitting: Family Medicine

## 2023-01-13 ENCOUNTER — Other Ambulatory Visit: Payer: Self-pay

## 2023-01-13 DIAGNOSIS — E782 Mixed hyperlipidemia: Secondary | ICD-10-CM

## 2023-01-13 DIAGNOSIS — I10 Essential (primary) hypertension: Secondary | ICD-10-CM

## 2023-01-13 DIAGNOSIS — E669 Obesity, unspecified: Secondary | ICD-10-CM

## 2023-01-13 DIAGNOSIS — R252 Cramp and spasm: Secondary | ICD-10-CM

## 2023-01-15 ENCOUNTER — Ambulatory Visit

## 2023-01-22 ENCOUNTER — Other Ambulatory Visit (INDEPENDENT_AMBULATORY_CARE_PROVIDER_SITE_OTHER)

## 2023-01-22 DIAGNOSIS — I10 Essential (primary) hypertension: Secondary | ICD-10-CM

## 2023-01-22 DIAGNOSIS — R252 Cramp and spasm: Secondary | ICD-10-CM

## 2023-01-22 DIAGNOSIS — E1169 Type 2 diabetes mellitus with other specified complication: Secondary | ICD-10-CM

## 2023-01-22 DIAGNOSIS — E669 Obesity, unspecified: Secondary | ICD-10-CM | POA: Diagnosis not present

## 2023-01-22 DIAGNOSIS — E782 Mixed hyperlipidemia: Secondary | ICD-10-CM

## 2023-01-22 LAB — COMPREHENSIVE METABOLIC PANEL
ALT: 21 U/L (ref 0–35)
AST: 21 U/L (ref 0–37)
Albumin: 4 g/dL (ref 3.5–5.2)
Alkaline Phosphatase: 58 U/L (ref 39–117)
BUN: 30 mg/dL — ABNORMAL HIGH (ref 6–23)
CO2: 27 mEq/L (ref 19–32)
Calcium: 8.8 mg/dL (ref 8.4–10.5)
Chloride: 106 mEq/L (ref 96–112)
Creatinine, Ser: 1.57 mg/dL — ABNORMAL HIGH (ref 0.40–1.20)
GFR: 37.7 mL/min — ABNORMAL LOW (ref >60.00)
Glucose, Bld: 111 mg/dL — ABNORMAL HIGH (ref 70–99)
Potassium: 4.7 mEq/L (ref 3.5–5.1)
Sodium: 140 mEq/L (ref 135–145)
Total Bilirubin: 0.5 mg/dL (ref 0.2–1.2)
Total Protein: 6.8 g/dL (ref 6.0–8.3)

## 2023-01-22 LAB — CBC WITH DIFFERENTIAL/PLATELET
Basophils Absolute: 0 10*3/uL (ref 0.0–0.1)
Basophils Relative: 0.3 % (ref 0.0–3.0)
Eosinophils Absolute: 0.2 10*3/uL (ref 0.0–0.7)
Eosinophils Relative: 2.2 % (ref 0.0–5.0)
HCT: 36 % (ref 36.0–46.0)
Hemoglobin: 12 g/dL (ref 12.0–15.0)
Lymphocytes Relative: 34.6 % (ref 12.0–46.0)
Lymphs Abs: 2.6 10*3/uL (ref 0.7–4.0)
MCHC: 33.4 g/dL (ref 30.0–36.0)
MCV: 92.5 fl (ref 78.0–100.0)
Monocytes Absolute: 0.5 10*3/uL (ref 0.1–1.0)
Monocytes Relative: 6.5 % (ref 3.0–12.0)
Neutro Abs: 4.3 10*3/uL (ref 1.4–7.7)
Neutrophils Relative %: 56.4 % (ref 43.0–77.0)
Platelets: 300 10*3/uL (ref 150.0–400.0)
RBC: 3.89 Mil/uL (ref 3.87–5.11)
RDW: 13 % (ref 11.5–15.5)
WBC: 7.6 10*3/uL (ref 4.0–10.5)

## 2023-01-22 LAB — LIPID PANEL
Cholesterol: 104 mg/dL (ref 0–200)
HDL: 20.9 mg/dL — ABNORMAL LOW (ref >39.00)
NonHDL: 82.99
Total CHOL/HDL Ratio: 5
Triglycerides: 259 mg/dL — ABNORMAL HIGH (ref 0.0–149.0)
VLDL: 51.8 mg/dL — ABNORMAL HIGH (ref 0.0–40.0)

## 2023-01-22 LAB — TSH: TSH: 1.8 u[IU]/mL (ref 0.35–5.50)

## 2023-01-22 LAB — HEMOGLOBIN A1C: Hgb A1c MFr Bld: 6.6 % — ABNORMAL HIGH (ref 4.6–6.5)

## 2023-01-22 LAB — MAGNESIUM: Magnesium: 1.5 mg/dL (ref 1.5–2.5)

## 2023-01-22 LAB — LDL CHOLESTEROL, DIRECT: Direct LDL: 52 mg/dL

## 2023-01-25 ENCOUNTER — Other Ambulatory Visit: Payer: Self-pay | Admitting: Family Medicine

## 2023-01-25 MED ORDER — TIRZEPATIDE 2.5 MG/0.5ML ~~LOC~~ SOAJ: 2.5000 mg | SUBCUTANEOUS | 1 refills | Status: DC

## 2023-01-26 ENCOUNTER — Other Ambulatory Visit: Payer: Self-pay

## 2023-01-26 MED ORDER — LISINOPRIL 10 MG PO TABS: 10.0000 mg | ORAL_TABLET | Freq: Two times a day (BID) | ORAL | 3 refills | Status: DC

## 2023-01-26 NOTE — Telephone Encounter (Signed)
Talk with pt agree to medication

## 2023-01-29 ENCOUNTER — Telehealth: Payer: Self-pay | Admitting: *Deleted

## 2023-01-29 MED ORDER — LISINOPRIL 10 MG PO TABS: 10.0000 mg | ORAL_TABLET | Freq: Two times a day (BID) | ORAL | 3 refills | Status: DC

## 2023-01-29 NOTE — Telephone Encounter (Signed)
Rx sent 

## 2023-02-02 ENCOUNTER — Other Ambulatory Visit: Payer: Self-pay

## 2023-02-16 ENCOUNTER — Encounter (HOSPITAL_COMMUNITY): Payer: Self-pay

## 2023-02-16 ENCOUNTER — Encounter: Payer: Self-pay | Admitting: Family Medicine

## 2023-02-16 ENCOUNTER — Emergency Department (HOSPITAL_COMMUNITY)
Admission: EM | Admit: 2023-02-16 | Discharge: 2023-02-16 | Disposition: A | Discharge status: Home / Self Care | Attending: Emergency Medicine | Admitting: Emergency Medicine

## 2023-02-16 ENCOUNTER — Other Ambulatory Visit: Payer: Self-pay

## 2023-02-16 ENCOUNTER — Other Ambulatory Visit: Payer: Self-pay | Admitting: Family Medicine

## 2023-02-16 DIAGNOSIS — Z7984 Long term (current) use of oral hypoglycemic drugs: Secondary | ICD-10-CM | POA: Insufficient documentation

## 2023-02-16 DIAGNOSIS — E162 Hypoglycemia, unspecified: Secondary | ICD-10-CM

## 2023-02-16 DIAGNOSIS — R197 Diarrhea, unspecified: Secondary | ICD-10-CM | POA: Insufficient documentation

## 2023-02-16 DIAGNOSIS — E860 Dehydration: Secondary | ICD-10-CM | POA: Insufficient documentation

## 2023-02-16 DIAGNOSIS — E11649 Type 2 diabetes mellitus with hypoglycemia without coma: Secondary | ICD-10-CM | POA: Insufficient documentation

## 2023-02-16 DIAGNOSIS — R112 Nausea with vomiting, unspecified: Secondary | ICD-10-CM | POA: Diagnosis not present

## 2023-02-16 LAB — COMPREHENSIVE METABOLIC PANEL
ALT: 29 U/L (ref 0–44)
AST: 39 U/L (ref 15–41)
Albumin: 3.5 g/dL (ref 3.5–5.0)
Alkaline Phosphatase: 47 U/L (ref 38–126)
Anion gap: 5 (ref 5–15)
BUN: 37 mg/dL — ABNORMAL HIGH (ref 6–20)
CO2: 21 mmol/L — ABNORMAL LOW (ref 22–32)
Calcium: 8.1 mg/dL — ABNORMAL LOW (ref 8.9–10.3)
Chloride: 108 mmol/L (ref 98–111)
Creatinine, Ser: 1.85 mg/dL — ABNORMAL HIGH (ref 0.44–1.00)
GFR, Estimated: 32 mL/min — ABNORMAL LOW (ref >60)
Glucose, Bld: 104 mg/dL — ABNORMAL HIGH (ref 70–99)
Potassium: 4.6 mmol/L (ref 3.5–5.1)
Sodium: 134 mmol/L — ABNORMAL LOW (ref 135–145)
Total Bilirubin: 0.7 mg/dL (ref 0.3–1.2)
Total Protein: 6.8 g/dL (ref 6.5–8.1)

## 2023-02-16 LAB — CBC WITH DIFFERENTIAL/PLATELET
Abs Immature Granulocytes: 0.02 10*3/uL (ref 0.00–0.07)
Basophils Absolute: 0 10*3/uL (ref 0.0–0.1)
Basophils Relative: 0 %
Eosinophils Absolute: 0.2 10*3/uL (ref 0.0–0.5)
Eosinophils Relative: 3 %
HCT: 36.5 % (ref 36.0–46.0)
Hemoglobin: 11.3 g/dL — ABNORMAL LOW (ref 12.0–15.0)
Immature Granulocytes: 0 %
Lymphocytes Relative: 15 %
Lymphs Abs: 1 10*3/uL (ref 0.7–4.0)
MCH: 30.7 pg (ref 26.0–34.0)
MCHC: 31 g/dL (ref 30.0–36.0)
MCV: 99.2 fL (ref 80.0–100.0)
Monocytes Absolute: 0.3 10*3/uL (ref 0.1–1.0)
Monocytes Relative: 5 %
Neutro Abs: 5.3 10*3/uL (ref 1.7–7.7)
Neutrophils Relative %: 77 %
Platelets: 246 10*3/uL (ref 150–400)
RBC: 3.68 MIL/uL — ABNORMAL LOW (ref 3.87–5.11)
RDW: 12.3 % (ref 11.5–15.5)
WBC: 6.9 10*3/uL (ref 4.0–10.5)
nRBC: 0 % (ref 0.0–0.2)

## 2023-02-16 LAB — LIPASE, BLOOD: Lipase: 39 U/L (ref 11–51)

## 2023-02-16 LAB — CBG MONITORING, ED: Glucose-Capillary: 101 mg/dL — ABNORMAL HIGH (ref 70–99)

## 2023-02-16 MED ORDER — ONDANSETRON HCL 4 MG PO TABS: 4.0000 mg | ORAL_TABLET | Freq: Three times a day (TID) | ORAL | 0 refills | Status: DC | PRN

## 2023-02-16 MED ORDER — SODIUM CHLORIDE 0.9 % IV BOLUS
500.0000 mL | Freq: Once | INTRAVENOUS | Status: AC
  Administered 2023-02-16: 500 mL via INTRAVENOUS

## 2023-02-16 NOTE — ED Provider Notes (Signed)
[facility] Provider Note   CSN: [id number] Arrival date & time: 02/16/23  1015     History  Chief Complaint  Patient presents with   Hypoglycemia    April Warren is a 52 y.o. female.  She has a history of diabetes is on oral medication and Trulicity shot.  She said about a week ago the whole family started with nausea vomiting diarrhea and she has not had great oral intake.  Today when she woke up she felt very weak and numb in her legs.  She tried to eat some peanut butter and sugar substrate without any improvement.  EMS found with blood sugar of 58 and gave amp of dextrose.  She said she feels back to baseline now.  Minimal abdominal cramps.  Had a low-grade fever yesterday.  No blood in the vomit or diarrhea.  Multiple other family member sick with same.  The history is provided by the patient and the EMS personnel.  Hypoglycemia Initial blood sugar:  58 Severity:  Moderate Progression:  Resolved Chronicity:  New Diabetic status:  Controlled with oral medications Context: decreased oral intake and recent illness   Relieved by:  IV glucose Ineffective treatments:  Oral glucose Associated symptoms: altered mental status, vomiting and weakness   Associated symptoms: no shortness of breath        Home Medications Prior to Admission medications   Medication Sig Start Date End Date Taking? Authorizing Provider  tirzepatide [facility]) 2.5 MG/0.5ML Pen Inject 2.5 mg into the skin once a week. 01/25/23   Mosie Lukes, MD  albuterol [facility] HFA) [phone] Base) MCG/ACT inhaler USE 2 INHALATIONS EVERY 6 HOURS AS NEEDED FOR WHEEZING OR SHORTNESS OF BREATH 09/23/21   Mosie Lukes, MD  atorvastatin (LIPITOR) 80 MG tablet TAKE 1 TABLET DAILY 04/21/22   Mosie Lukes, MD  Blood Glucose Monitoring Suppl (FREESTYLE FREEDOM LITE) w/Device KIT Check blood sugar twice daily 10/14/17   Mosie Lukes, MD  cetirizine (ZYRTEC) 10 MG tablet TAKE 1  TABLET DAILY AS NEEDED FOR ALLERGIES 07/03/22   Mosie Lukes, MD  ezetimibe (ZETIA) 10 MG tablet Take 1 tablet (10 mg total) by mouth daily. 12/30/22   Mosie Lukes, MD  fenofibrate micronized (LOFIBRA) 134 MG capsule TAKE 1 CAPSULE DAILY BEFORE BREAKFAST 12/25/22   Mosie Lukes, MD  fluconazole (DIFLUCAN) 150 MG tablet Take 1 tablet (150 mg total) by mouth once a week. 12/25/22   Mosie Lukes, MD  gabapentin (NEURONTIN) 300 MG capsule TAKE 1 CAPSULE TWICE A DAY AND 2 CAPSULES AT BEDTIME 06/30/22   Mosie Lukes, MD  glipiZIDE (GLUCOTROL) 10 MG tablet TAKE ONE-HALF (1/2) TABLET TWICE A DAY BEFORE MEALS 07/21/22   Penni Homans A, MD  glucose blood test strip Use as instructed 02/12/18   Mosie Lukes, MD  ibuprofen (ADVIL,MOTRIN) 200 MG tablet Take 400 mg by mouth every 8 (eight) hours as needed.    [provider]  JANUVIA 100 MG tablet TAKE 1 TABLET DAILY 11/13/22   Mosie Lukes, MD  Lancets (FREESTYLE) lancets Check blood sugar twice daily 11/17/17   Mosie Lukes, MD  lisinopril (ZESTRIL) 10 MG tablet Take 1 tablet (10 mg total) by mouth 2 (two) times daily. 01/29/23   Mosie Lukes, MD  LORazepam (ATIVAN) 1 MG tablet TAKE ONE-HALF (1/2) TABLET TWICE A DAY AS NEEDED FOR ANXIETY 06/30/22   Mosie Lukes, MD  Magnesium  Oxide 400 MG CAPS Take 1 capsule by mouth daily. Pt is taking 858m capsule every other day.    [provider]  metFORMIN (GLUCOPHAGE-XR) 750 MG 24 hr tablet TAKE 1 TABLET THREE TIMES A DAY 08/12/21   BMosie Lukes MD  methylPREDNISolone (MEDROL) 4 MG tablet 5 tabs po x 1 day then 4 tabs po x 1 day then 3 tabs po x 1 day then 2 tabs po x 1 day then 1 tab po x 1 day and stop 12/25/22   BMosie Lukes MD  metoprolol tartrate (LOPRESSOR) 50 MG tablet TAKE 1 TABLET TWICE A DAY 08/06/22   BMosie Lukes MD  montelukast (SINGULAIR) 10 MG tablet TAKE 1 TABLET AT BEDTIME 02/16/23   BMosie Lukes MD  triamcinolone (NASACORT) 55 MCG/ACT AERO nasal inhaler  Place 2 sprays into the nose daily as needed. 09/11/20   BMosie Lukes MD  triamterene-hydrochlorothiazide (MAXZIDE-25) 37.5-25 MG tablet TAKE 1 TABLET DAILY 08/06/22   BMosie Lukes MD  TRULICITY 4.5 M0000000SOPN INJECT 4.5 MG AS DIRECTED ONCE A WEEK 10/06/22 04/04/23  BMosie Lukes MD  Vitamin D, Ergocalciferol, (DRISDOL) 1.25 MG (50000 UNIT) CAPS capsule TAKE 1 CAPSULE EVERY 7 DAYS 01/09/23   BMosie Lukes MD      Allergies    Patient has no known allergies.    Review of Systems   Review of Systems  Constitutional:  Positive for fever.  HENT:  Negative for sore throat.   Respiratory:  Negative for shortness of breath.   Cardiovascular:  Negative for chest pain.  Gastrointestinal:  Positive for abdominal pain, diarrhea, nausea and vomiting.  Genitourinary:  Negative for dysuria.  Skin:  Negative for rash.  Neurological:  Positive for weakness.    Physical Exam Updated Vital Signs BP 108/62 (BP Location: Left Arm)   Pulse 68   Temp 98.2 F (36.8 C) (Oral)   Resp 18   Ht 5' 9"$  (1.753 m)   Wt 123.4 kg   SpO2 98%   BMI 40.17 kg/m  Physical Exam Vitals and nursing note reviewed.  Constitutional:      General: She is not in acute distress.    Appearance: Normal appearance. She is well-developed. She is obese.  HENT:     Head: Normocephalic and atraumatic.  Eyes:     Conjunctiva/sclera: Conjunctivae normal.  Cardiovascular:     Rate and Rhythm: Normal rate and regular rhythm.     Heart sounds: No murmur heard. Pulmonary:     Effort: Pulmonary effort is normal. No respiratory distress.     Breath sounds: Normal breath sounds.  Abdominal:     Palpations: Abdomen is soft.     Tenderness: There is no abdominal tenderness. There is no guarding or rebound.  Musculoskeletal:        General: No swelling.     Cervical back: Neck supple.  Skin:    General: Skin is warm and dry.     Capillary Refill: Capillary refill takes less than 2 seconds.  Neurological:      General: No focal deficit present.     Mental Status: She is alert and oriented to person, place, and time.     Sensory: No sensory deficit.     Motor: No weakness.     ED Results / Procedures / Treatments   Labs (all labs ordered are listed, but only abnormal results are displayed) Labs Reviewed  COMPREHENSIVE METABOLIC PANEL - Abnormal; Notable for the  following components:      Result Value   Sodium 134 (*)    CO2 21 (*)    Glucose, Bld 104 (*)    BUN 37 (*)    Creatinine, Ser 1.85 (*)    Calcium 8.1 (*)    GFR, Estimated 32 (*)    All other components within normal limits  CBC WITH DIFFERENTIAL/PLATELET - Abnormal; Notable for the following components:   RBC 3.68 (*)    Hemoglobin 11.3 (*)    All other components within normal limits  CBG MONITORING, ED - Abnormal; Notable for the following components:   Glucose-Capillary 101 (*)    All other components within normal limits  LIPASE, BLOOD    EKG None  Radiology No results found.  Procedures Procedures    Medications Ordered in ED Medications  sodium chloride 0.9 % bolus 500 mL (has no administration in time range)    ED Course/ Medical Decision Making/ A&P                             Medical Decision Making Amount and/or Complexity of Data Reviewed Labs: ordered.  Risk Prescription drug management.   This patient complains of low blood sugar altered mental status lower extremity tingling; this involves an extensive number of treatment Options and is a complaint that carries with it a high risk of complications and morbidity. The differential includes hypoglycemia, hypovolemia, metabolic derangement, dehydration  I ordered, reviewed and interpreted labs, which included CBC with normal white count, hemoglobin low slightly down from priors, chemistries with elevated BUN and creatinine and low bicarb I ordered medication IV fluids and reviewed PMP when indicated. Additional history obtained from EMS and  patient's husband Previous records obtained and reviewed in epic no recent admission Cardiac monitoring reviewed, normal sinus rhythm Social determinants considered, no significant barriers Critical Interventions: None  After the interventions stated above, I reevaluated the patient and found patient blood sugar remained stable awake alert in no distress tolerating p.o. Admission and further testing considered, no indications for admission at this time.  Will cover patient with some Zofran and recommended continuing to increase her carb and fluid intake.  Return instructions discussed.         Final Clinical Impression(s) / ED Diagnoses Final diagnoses:  Hypoglycemia  Nausea vomiting and diarrhea  Dehydration    Rx / DC Orders ED Discharge Orders          Ordered    ondansetron (ZOFRAN) 4 MG tablet  Every 8 hours PRN        02/16/23 1253              Hayden Rasmussen, MD 02/16/23 [phone]

## 2023-02-16 NOTE — Discharge Instructions (Addendum)
You were seen in the emergency department for low blood sugar in the setting of recent nausea vomiting diarrhea.  Your lab work showed you to be slightly dehydrated.  We are prescribing you some nausea medication.  Please try to stay hydrated and keep some carbohydrates in your system.  Follow-up with your regular doctor.  Return to the emergency department if any worsening or concerning symptoms.

## 2023-02-16 NOTE — ED Notes (Signed)
Patient given crackers, peanut butter and a soda

## 2023-02-16 NOTE — ED Notes (Signed)
Pt care taken, resting blood sugar was low but she feels better now.

## 2023-02-16 NOTE — ED Triage Notes (Signed)
Pt at home and family called [facility] for slow responsive and slow speech. CBG was 58 and took po intake increased to 82 then 54. 20G RAC per [facility] and gave 25g of dextrose. Cbg increased 127 then 149. Pt also n/v /d x 5 days. Family members has a stomach virus.

## 2023-02-16 NOTE — ED Notes (Signed)
Patient assisted to the bathroom 

## 2023-02-17 ENCOUNTER — Ambulatory Visit

## 2023-02-17 NOTE — Telephone Encounter (Signed)
Pt called stating that she would like to talk with Dr. Charlett Blake on her recent ED visit regarding her BS fluctuating. After discussing with Lillia Carmel, advised that it would be best to get her in for an appointment with a provider to look into this further. Pt refused stating that she would rather have Dr. Charlett Blake look at the notes and give her a call back as to what to do next. Advised pt that her message would be routed high priority to Dr. Charlett Blake for review. Advised to continue to monitor her BS levels and if they dropped out of normal range again to call us back to let us know. Pt acknowledged understanding.

## 2023-02-25 ENCOUNTER — Ambulatory Visit

## 2023-04-06 ENCOUNTER — Ambulatory Visit: Admitting: Family Medicine

## 2023-04-15 ENCOUNTER — Other Ambulatory Visit: Payer: Self-pay | Admitting: Family Medicine

## 2023-04-17 ENCOUNTER — Other Ambulatory Visit: Payer: Self-pay | Admitting: Family Medicine

## 2023-04-29 NOTE — Assessment & Plan Note (Signed)
Encourage heart healthy diet such as MIND or DASH diet, increase exercise, avoid trans fats, simple carbohydrates and processed foods, consider a krill or fish or flaxseed oil cap daily. Tolerating Atorvastatin 

## 2023-04-29 NOTE — Assessment & Plan Note (Addendum)
Encouraged DASH or MIND diet, decrease po intake and increase exercise as tolerated. Needs 7-8 hours of sleep nightly. Avoid trans fats, eat small, frequent meals every 4-5 hours with lean proteins, complex carbs and healthy fats. Minimize simple carbs, high fat foods and processed foods. Weight loss has plateued and appetite is increasing on Trulicity could consider a switch to wegovy or Mounjaro to see if that is helpful. Will check with her endocrinologist.

## 2023-04-29 NOTE — Assessment & Plan Note (Signed)
hgba1c acceptable, minimize simple carbs. Increase exercise as tolerated. Continue current meds 

## 2023-04-29 NOTE — Assessment & Plan Note (Signed)
Hydrate and monitor 

## 2023-04-29 NOTE — Assessment & Plan Note (Signed)
Well controlled, no changes to meds. Encouraged heart healthy diet such as the DASH diet and exercise as tolerated.  °

## 2023-04-30 ENCOUNTER — Ambulatory Visit: Admitting: Family Medicine

## 2023-04-30 ENCOUNTER — Encounter: Payer: Self-pay | Admitting: Family Medicine

## 2023-04-30 VITALS — BP 128/78 | HR 63 | Temp 97.6°F | Resp 12 | Ht 69.0 in | Wt 273.0 lb

## 2023-04-30 DIAGNOSIS — E1169 Type 2 diabetes mellitus with other specified complication: Secondary | ICD-10-CM | POA: Diagnosis not present

## 2023-04-30 DIAGNOSIS — E669 Obesity, unspecified: Secondary | ICD-10-CM

## 2023-04-30 DIAGNOSIS — R252 Cramp and spasm: Secondary | ICD-10-CM

## 2023-04-30 DIAGNOSIS — F419 Anxiety disorder, unspecified: Secondary | ICD-10-CM

## 2023-04-30 DIAGNOSIS — Z7984 Long term (current) use of oral hypoglycemic drugs: Secondary | ICD-10-CM | POA: Diagnosis not present

## 2023-04-30 DIAGNOSIS — Z1211 Encounter for screening for malignant neoplasm of colon: Secondary | ICD-10-CM

## 2023-04-30 DIAGNOSIS — E782 Mixed hyperlipidemia: Secondary | ICD-10-CM

## 2023-04-30 DIAGNOSIS — Z6841 Body Mass Index (BMI) 40.0 and over, adult: Secondary | ICD-10-CM

## 2023-04-30 DIAGNOSIS — Z23 Encounter for immunization: Secondary | ICD-10-CM | POA: Diagnosis not present

## 2023-04-30 DIAGNOSIS — Z79899 Other long term (current) drug therapy: Secondary | ICD-10-CM

## 2023-04-30 DIAGNOSIS — E559 Vitamin D deficiency, unspecified: Secondary | ICD-10-CM | POA: Insufficient documentation

## 2023-04-30 DIAGNOSIS — I10 Essential (primary) hypertension: Secondary | ICD-10-CM | POA: Diagnosis not present

## 2023-04-30 LAB — LIPID PANEL
Cholesterol: 84 mg/dL (ref 0–200)
HDL: 19.5 mg/dL — ABNORMAL LOW (ref >39.00)
LDL Cholesterol: 33 mg/dL (ref 0–99)
NonHDL: 64.06
Total CHOL/HDL Ratio: 4
Triglycerides: 155 mg/dL — ABNORMAL HIGH (ref 0.0–149.0)
VLDL: 31 mg/dL (ref 0.0–40.0)

## 2023-04-30 LAB — CBC WITH DIFFERENTIAL/PLATELET
Basophils Absolute: 0 10*3/uL (ref 0.0–0.1)
Basophils Relative: 0.6 % (ref 0.0–3.0)
Eosinophils Absolute: 0.2 10*3/uL (ref 0.0–0.7)
Eosinophils Relative: 2.2 % (ref 0.0–5.0)
HCT: 37.6 % (ref 36.0–46.0)
Hemoglobin: 12.6 g/dL (ref 12.0–15.0)
Lymphocytes Relative: 32.4 % (ref 12.0–46.0)
Lymphs Abs: 2.4 10*3/uL (ref 0.7–4.0)
MCHC: 33.4 g/dL (ref 30.0–36.0)
MCV: 91.8 fl (ref 78.0–100.0)
Monocytes Absolute: 0.4 10*3/uL (ref 0.1–1.0)
Monocytes Relative: 6.1 % (ref 3.0–12.0)
Neutro Abs: 4.3 10*3/uL (ref 1.4–7.7)
Neutrophils Relative %: 58.7 % (ref 43.0–77.0)
Platelets: 294 10*3/uL (ref 150.0–400.0)
RBC: 4.1 Mil/uL (ref 3.87–5.11)
RDW: 13.1 % (ref 11.5–15.5)
WBC: 7.3 10*3/uL (ref 4.0–10.5)

## 2023-04-30 LAB — COMPREHENSIVE METABOLIC PANEL
ALT: 16 U/L (ref 0–35)
AST: 18 U/L (ref 0–37)
Albumin: 3.9 g/dL (ref 3.5–5.2)
Alkaline Phosphatase: 59 U/L (ref 39–117)
BUN: 27 mg/dL — ABNORMAL HIGH (ref 6–23)
CO2: 26 mEq/L (ref 19–32)
Calcium: 9.4 mg/dL (ref 8.4–10.5)
Chloride: 103 mEq/L (ref 96–112)
Creatinine, Ser: 1.53 mg/dL — ABNORMAL HIGH (ref 0.40–1.20)
GFR: 38.81 mL/min — ABNORMAL LOW (ref >60.00)
Glucose, Bld: 145 mg/dL — ABNORMAL HIGH (ref 70–99)
Potassium: 4.8 mEq/L (ref 3.5–5.1)
Sodium: 137 mEq/L (ref 135–145)
Total Bilirubin: 0.5 mg/dL (ref 0.2–1.2)
Total Protein: 7.2 g/dL (ref 6.0–8.3)

## 2023-04-30 LAB — VITAMIN D 25 HYDROXY (VIT D DEFICIENCY, FRACTURES): VITD: 81.1 ng/mL (ref 30.00–100.00)

## 2023-04-30 LAB — TSH: TSH: 1.32 u[IU]/mL (ref 0.35–5.50)

## 2023-04-30 LAB — MICROALBUMIN / CREATININE URINE RATIO
Creatinine,U: 114.4 mg/dL
Microalb Creat Ratio: 1.1 mg/g (ref 0.0–30.0)
Microalb, Ur: 1.3 mg/dL (ref 0.0–1.9)

## 2023-04-30 LAB — HEMOGLOBIN A1C: Hgb A1c MFr Bld: 7.1 % — ABNORMAL HIGH (ref 4.6–6.5)

## 2023-04-30 LAB — MAGNESIUM: Magnesium: 1.6 mg/dL (ref 1.5–2.5)

## 2023-04-30 MED ORDER — LORAZEPAM 1 MG PO TABS: ORAL_TABLET | ORAL | 1 refills | Status: DC

## 2023-04-30 NOTE — Progress Notes (Addendum)
Subjective:   By signing my name below, I, April Warren, attest that this documentation has been prepared under the direction and in the presence of April Canary, MD., 04/30/2023.   Patient ID: April Warren, female    DOB: 1971/10/24, 52 y.o.   MRN: [id number]  Chief Complaint  Patient presents with   4 month follow up   HPI Patient is in today for an office visit.  Colon Cancer Screening Patient states that her busy schedule makes it difficult for her to proceed with a colonoscopy, but she is requesting an order for a Cologuard to screen for colon cancer.  Immunizations Patient is requesting a Tetanus vaccination today but is not interested in receiving a Shingles immunization at this time.  Muscle Cramps Patient reports that she still experiences intermittent muscle cramps but has been taking Magnesium 400 mg as needed and attempting to hydrate. She denies CP/palpitations/SOB/HA/fever/chills/GI or GU symptoms.  Type 2 Diabetes Mellitus with Obesity Patient was admitted to the [facility] ED on 02/16/2023 due to numbness and weakness associated with hypoglycemia. Her blood sugar was measured at 58 mg/dL and she was given oral glucose. She reports that since then, her average blood sugar has been 140-150 mg/dL with her most recent high being 228 mg/dL and most recent low being 135-140 mg/dL. She continues taking Januvia 100 mg once daily and Metformin 750 mg three times daily which have been tolerable. Additionally, she is obese with body mass index of 40.32 kg/m and currently takes Trulicity which has also been tolerable. Wt Readings from Last 3 Encounters:  04/30/23 273 lb (123.8 kg)  02/16/23 272 lb (123.4 kg)  12/25/22 276 lb (125.2 kg)   Lab Results  Component Value Date   HGBA1C 7.1 (H) 04/30/2023   Past Medical History:  Diagnosis Date   Allergy    Asthma    Certain time of the year   Chicken pox 52 yrs old   Diabetes mellitus 08/12/2012   type 2- dr Ashley Royalty  diagnosed   Diabetic retinopathy (HCC)    Dysmenorrhea 08/20/2012   HTN (hypertension) 08/20/2012   Hyperlipidemia, mixed 08/28/2013   Neuromuscular disorder (HCC)    Not sure of date   Neuropathy 02/16/2017   Obesity    Obesity, unspecified 08/28/2013   Other and unspecified hyperlipidemia 08/28/2013   Preventative health care 08/20/2012   Sinusitis 11/14/2013   Sinusitis, acute 05/02/2016    Past Surgical History:  Procedure Laterality Date   EYE SURGERY     Laser surgery for bleeds   REFRACTIVE SURGERY  08/12/2012   right, left on 09/16/12    Family History  Problem Relation Age of Onset   Cancer Mother        breast   Diabetes Mother        type 2   Hypertension Mother    Hypertension Father    Hyperlipidemia Father    COPD Father    Arthritis Father    Asthma Father    Hypertension Maternal Grandmother    Hyperlipidemia Maternal Grandmother    COPD Maternal Grandmother    Asthma Maternal Grandmother    Hypertension Maternal Grandfather    Heart attack Maternal Grandfather    Stroke Maternal Grandfather    Gout Brother    Cancer Brother        lung   Heart disease Paternal Grandfather    Alcohol abuse Paternal Grandfather     Social History   Socioeconomic History   Marital  status: Married    Spouse name: Not on file   Number of children: Not on file   Years of education: Not on file   Highest education level: Associate degree: academic program  Occupational History   Not on file  Tobacco Use   Smoking status: Never   Smokeless tobacco: Never  Substance and Sexual Activity   Alcohol use: Yes    Comment: One or two a year   Drug use: No   Sexual activity: Yes    Partners: Male    Birth control/protection: None  Other Topics Concern   Not on file  Social History Narrative   Lives with husband.    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    Not  exercising   Social Determinants of Health   Financial Resource Strain: Low Risk  (04/29/2023)   Overall Financial Resource Strain (CARDIA)    Difficulty of Paying Living Expenses: Not hard at all  Food Insecurity: No Food Insecurity (04/29/2023)   Hunger Vital Sign    Worried About Running Out of Food in the Last Year: Never true    Ran Out of Food in the Last Year: Never true  Transportation Needs: No Transportation Needs (04/29/2023)   PRAPARE - Administrator, Civil Service (Medical): No    Lack of Transportation (Non-Medical): No  Physical Activity: Unknown (04/29/2023)   Exercise Vital Sign    Days of Exercise per Week: 0 days    Minutes of Exercise per Session: Not on file  Stress: Stress Concern Present (04/29/2023)   [employer] of Occupational Health - Occupational Stress Questionnaire    Feeling of Stress : To some extent  Social Connections: Moderately Isolated (04/29/2023)   Social Connection and Isolation Panel [NHANES]    Frequency of Communication with Friends and Family: More than three times a week    Frequency of Social Gatherings with Friends and Family: Once a week    Attends Religious Services: Never    Database administrator or Organizations: No    Attends Engineer, structural: Not on file    Marital Status: Married  Catering manager Violence: Not on file    Outpatient Medications Prior to Visit  Medication Sig Dispense Refill   albuterol (PROAIR HFA) 108 (90 Base) MCG/ACT inhaler USE 2 INHALATIONS EVERY 6 HOURS AS NEEDED FOR WHEEZING OR SHORTNESS OF BREATH 18 g 5   atorvastatin (LIPITOR) 80 MG tablet TAKE 1 TABLET DAILY 90 tablet 3   Blood Glucose Monitoring Suppl (FREESTYLE FREEDOM LITE) w/Device KIT Check blood sugar twice daily 1 each 0   cetirizine (ZYRTEC) 10 MG tablet TAKE 1 TABLET DAILY AS NEEDED FOR ALLERGIES 90 tablet 3   ezetimibe (ZETIA) 10 MG tablet Take 1 tablet (10 mg total) by mouth daily. 90 tablet 3   fenofibrate  micronized (LOFIBRA) 134 MG capsule TAKE 1 CAPSULE DAILY BEFORE BREAKFAST 90 capsule 3   fluconazole (DIFLUCAN) 150 MG tablet Take 1 tablet (150 mg total) by mouth once a week. 1 tablet 0   gabapentin (NEURONTIN) 300 MG capsule TAKE 1 CAPSULE TWICE A DAY AND 2 CAPSULES AT BEDTIME 360 capsule 3   glipiZIDE (GLUCOTROL) 10 MG tablet TAKE ONE-HALF (1/2) TABLET TWICE A DAY BEFORE MEALS 90 tablet 3   glucose blood test strip Use as instructed 100 each 12   ibuprofen (ADVIL,MOTRIN) 200 MG tablet  Take 400 mg by mouth every 8 (eight) hours as needed.     JANUVIA 100 MG tablet TAKE 1 TABLET DAILY 90 tablet 3   Lancets (FREESTYLE) lancets Check blood sugar twice daily 200 each 5   lisinopril (ZESTRIL) 10 MG tablet Take 1 tablet (10 mg total) by mouth 2 (two) times daily. 180 tablet 3   Magnesium Oxide 400 MG CAPS Take 1 capsule by mouth daily. Pt is taking 825mg  capsule every other day.     metFORMIN (GLUCOPHAGE-XR) 750 MG 24 hr tablet TAKE 1 TABLET THREE TIMES A DAY 270 tablet 3   metoprolol tartrate (LOPRESSOR) 50 MG tablet TAKE 1 TABLET TWICE A DAY 180 tablet 3   montelukast (SINGULAIR) 10 MG tablet TAKE 1 TABLET AT BEDTIME 90 tablet 3   triamterene-hydrochlorothiazide (MAXZIDE-25) 37.5-25 MG tablet TAKE 1 TABLET DAILY 90 tablet 3   LORazepam (ATIVAN) 1 MG tablet TAKE ONE-HALF (1/2) TABLET TWICE A DAY AS NEEDED FOR ANXIETY 90 tablet 1   methylPREDNISolone (MEDROL) 4 MG tablet 5 tabs po x 1 day then 4 tabs po x 1 day then 3 tabs po x 1 day then 2 tabs po x 1 day then 1 tab po x 1 day and stop 50 tablet 0   ondansetron (ZOFRAN) 4 MG tablet Take 1 tablet (4 mg total) by mouth every 8 (eight) hours as needed for nausea or vomiting. 15 tablet 0   tirzepatide (MOUNJARO) 2.5 MG/0.5ML Pen Inject 2.5 mg into the skin once a week. 2 mL 1   triamcinolone (NASACORT) 55 MCG/ACT AERO nasal inhaler Place 2 sprays into the nose daily as needed. 1 each 5   Vitamin D, Ergocalciferol, (DRISDOL) 1.25 MG (50000 UNIT) CAPS  capsule TAKE 1 CAPSULE EVERY 7 DAYS 12 capsule 3   No facility-administered medications prior to visit.    No Known Allergies  Review of Systems  Constitutional:  Negative for chills and fever.  Respiratory:  Negative for shortness of breath.   Cardiovascular:  Negative for chest pain and palpitations.  Gastrointestinal:  Negative for abdominal pain, blood in stool, constipation, diarrhea, nausea and vomiting.  Genitourinary:  Negative for dysuria, frequency, hematuria and urgency.  Skin:           Neurological:  Negative for headaches.       Objective:    Physical Exam Constitutional:      General: She is not in acute distress.    Appearance: Normal appearance. She is obese. She is not ill-appearing.  HENT:     Head: Normocephalic and atraumatic.     Right Ear: External ear normal.     Left Ear: External ear normal.     Nose: Nose normal.     Mouth/Throat:     Mouth: Mucous membranes are moist.     Pharynx: Oropharynx is clear.  Eyes:     General:        Right eye: No discharge.        Left eye: No discharge.     Extraocular Movements: Extraocular movements intact.     Conjunctiva/sclera: Conjunctivae normal.     Pupils: Pupils are equal, round, and reactive to light.  Cardiovascular:     Rate and Rhythm: Normal rate and regular rhythm.     Pulses: Normal pulses.     Heart sounds: Normal heart sounds. No murmur heard.    No gallop.  Pulmonary:     Effort: Pulmonary effort is normal. No respiratory distress.  Breath sounds: Normal breath sounds. No wheezing or rales.  Abdominal:     General: Bowel sounds are normal.     Palpations: Abdomen is soft.     Tenderness: There is no abdominal tenderness. There is no guarding.  Musculoskeletal:        General: Normal range of motion.     Cervical back: Normal range of motion.     Right lower leg: No edema.     Left lower leg: No edema.  Skin:    General: Skin is warm and dry.  Neurological:     Mental Status:  She is alert and oriented to person, place, and time.  Psychiatric:        Mood and Affect: Mood normal.        Behavior: Behavior normal.        Judgment: Judgment normal.     BP 128/78 (BP Location: Right Arm, Cuff Size: Large)   Pulse 63   Temp 97.6 F (36.4 C) (Oral)   Resp 12   Ht 5\' 9"  (1.753 m)   Wt 273 lb (123.8 kg)   SpO2 99%   BMI 40.32 kg/m  Wt Readings from Last 3 Encounters:  04/30/23 273 lb (123.8 kg)  02/16/23 272 lb (123.4 kg)  12/25/22 276 lb (125.2 kg)    Diabetic Foot Exam - Simple   No data filed    Lab Results  Component Value Date   WBC 7.3 04/30/2023   HGB 12.6 04/30/2023   HCT 37.6 04/30/2023   PLT 294.0 04/30/2023   GLUCOSE 145 (H) 04/30/2023   CHOL 84 04/30/2023   TRIG 155.0 (H) 04/30/2023   HDL 19.50 (L) 04/30/2023   LDLDIRECT 52.0 01/22/2023   LDLCALC 33 04/30/2023   ALT 16 04/30/2023   AST 18 04/30/2023   NA 137 04/30/2023   K 4.8 04/30/2023   CL 103 04/30/2023   CREATININE 1.53 (H) 04/30/2023   BUN 27 (H) 04/30/2023   CO2 26 04/30/2023   TSH 1.32 04/30/2023   INR 1.03 03/04/2011   HGBA1C 7.1 (H) 04/30/2023   MICROALBUR 1.3 04/30/2023    Lab Results  Component Value Date   TSH 1.32 04/30/2023   Lab Results  Component Value Date   WBC 7.3 04/30/2023   HGB 12.6 04/30/2023   HCT 37.6 04/30/2023   MCV 91.8 04/30/2023   PLT 294.0 04/30/2023   Lab Results  Component Value Date   NA 137 04/30/2023   K 4.8 04/30/2023   CO2 26 04/30/2023   GLUCOSE 145 (H) 04/30/2023   BUN 27 (H) 04/30/2023   CREATININE 1.53 (H) 04/30/2023   BILITOT 0.5 04/30/2023   ALKPHOS 59 04/30/2023   AST 18 04/30/2023   ALT 16 04/30/2023   PROT 7.2 04/30/2023   ALBUMIN 3.9 04/30/2023   CALCIUM 9.4 04/30/2023   ANIONGAP 5 02/16/2023   GFR 38.81 (L) 04/30/2023   Lab Results  Component Value Date   CHOL 84 04/30/2023   Lab Results  Component Value Date   HDL 19.50 (L) 04/30/2023   Lab Results  Component Value Date   LDLCALC 33  04/30/2023   Lab Results  Component Value Date   TRIG 155.0 (H) 04/30/2023   Lab Results  Component Value Date   CHOLHDL 4 04/30/2023   Lab Results  Component Value Date   HGBA1C 7.1 (H) 04/30/2023      Assessment & Plan:  Anxiety/Depression: Anxiety/depression is controlled with Lorazepam 1 mg which was refilled today.  Cologuard: Cologuard ordered today.  Immunizations: Tetanus vaccination administered today. Encouraged patient to consider annual COVID-19 and Influenza vaccinations as well as Shingles vaccination.   Labs: Routine blood word and urinary analysis ordered today.  Muscle Cramps: Patient continues taking Magnesium Oxide 400 mg as needed and has been attempting to hydrate to manage muscle cramps.  Type 2 Diabetes Mellitus with Obesity: Patient continues taking Trulicity, Januvia 100 mg once daily, and Metformin 750 mg three times daily.  Problem List Items Addressed This Visit     HTN (hypertension)    Well controlled, no changes to meds. Encouraged heart healthy diet such as the DASH diet and exercise as tolerated.       Relevant Orders   CBC with Differential/Platelet (Completed)   Comprehensive metabolic panel (Completed)   TSH (Completed)   Hyperlipidemia, mixed    Encourage heart healthy diet such as MIND or DASH diet, increase exercise, avoid trans fats, simple carbohydrates and processed foods, consider a krill or fish or flaxseed oil cap daily. Tolerating Atorvastatin      Relevant Orders   Lipid panel (Completed)   Muscle cramps    Hydrate and monitor      Relevant Orders   Magnesium (Completed)   Type 2 diabetes mellitus with obesity (HCC) - Primary    hgba1c acceptable, minimize simple carbs. Increase exercise as tolerated. Continue current meds      Relevant Orders   Microalbumin / creatinine urine ratio (Completed)   Hemoglobin A1c (Completed)   Vitamin D deficiency    Supplement and monitor       Relevant Orders   VITAMIN D 25  Hydroxy (Vit-D Deficiency, Fractures) (Completed)   Anxiety    Allowed refill of Alprazolam to use prn.       Relevant Medications   LORazepam (ATIVAN) 1 MG tablet   Obesity    Encouraged DASH or MIND diet, decrease po intake and increase exercise as tolerated. Needs 7-8 hours of sleep nightly. Avoid trans fats, eat small, frequent meals every 4-5 hours with lean proteins, complex carbs and healthy fats. Minimize simple carbs, high fat foods and processed foods. Weight loss has plateued and appetite is increasing on Trulicity could consider a switch to wegovy or Mounjaro to see if that is helpful. Will check with her endocrinologist.       Other Visit Diagnoses     Need for diphtheria-tetanus-pertussis (Tdap) vaccine       Relevant Orders   Tdap vaccine greater than or equal to 7yo IM (Completed)   High risk medication use       Relevant Orders   Drug Monitoring Panel [phone] , Urine (Completed)   DM TEMPLATE (Completed)   Colon cancer screening       Relevant Orders   Cologuard      Meds ordered this encounter  Medications   LORazepam (ATIVAN) 1 MG tablet    Sig: TAKE ONE-HALF (1/2) TABLET TWICE A DAY AS NEEDED FOR ANXIETY    Dispense:  90 tablet    Refill:  1   I, Danise Edge, MD, personally preformed the services described in this documentation.  All medical record entries made by the scribe were at my direction and in my presence.  I have reviewed the chart and discharge instructions (if applicable) and agree that the record reflects my personal performance and is accurate and complete. 04/30/2023  I,Mohammed Iqbal,acting as a scribe for Danise Edge, MD.,have documented all relevant documentation on the behalf of Misty Stanley  Charlett Blake, MD,as directed by  Penni Homans, MD while in the presence of Penni Homans, MD.  Penni Homans, MD

## 2023-04-30 NOTE — Assessment & Plan Note (Signed)
Supplement and monitor 

## 2023-04-30 NOTE — Patient Instructions (Signed)

## 2023-05-01 ENCOUNTER — Encounter: Payer: Self-pay | Admitting: Family Medicine

## 2023-05-02 LAB — DRUG MONITORING PANEL 376104, URINE
Amphetamines: NEGATIVE ng/mL (ref <500)
Barbiturates: NEGATIVE ng/mL (ref <300)
Benzodiazepines: NEGATIVE ng/mL (ref <100)
Cocaine Metabolite: NEGATIVE ng/mL (ref <150)
Desmethyltramadol: NEGATIVE ng/mL (ref <100)
Opiates: NEGATIVE ng/mL (ref <100)
Oxycodone: NEGATIVE ng/mL (ref <100)
Tramadol: NEGATIVE ng/mL (ref <100)

## 2023-05-02 LAB — DM TEMPLATE

## 2023-05-02 NOTE — Assessment & Plan Note (Signed)
Allowed refill of Alprazolam to use prn 

## 2023-05-04 ENCOUNTER — Other Ambulatory Visit: Payer: Self-pay | Admitting: Family Medicine

## 2023-05-04 MED ORDER — SEMAGLUTIDE (2 MG/DOSE) 8 MG/3ML ~~LOC~~ SOPN: 2.0000 mg | PEN_INJECTOR | SUBCUTANEOUS | 3 refills | Status: DC

## 2023-05-20 ENCOUNTER — Other Ambulatory Visit: Payer: Self-pay

## 2023-05-20 MED ORDER — TRULICITY 3 MG/0.5ML ~~LOC~~ SOAJ: 3.0000 mg | SUBCUTANEOUS | 1 refills | Status: DC

## 2023-05-22 ENCOUNTER — Telehealth: Payer: Self-pay

## 2023-05-22 NOTE — Telephone Encounter (Signed)
done

## 2023-05-27 ENCOUNTER — Other Ambulatory Visit: Payer: Self-pay

## 2023-06-29 ENCOUNTER — Other Ambulatory Visit: Payer: Self-pay | Admitting: Family Medicine

## 2023-06-29 DIAGNOSIS — T7840XD Allergy, unspecified, subsequent encounter: Secondary | ICD-10-CM

## 2023-07-16 ENCOUNTER — Other Ambulatory Visit: Payer: Self-pay | Admitting: Family Medicine

## 2023-07-16 DIAGNOSIS — E669 Obesity, unspecified: Secondary | ICD-10-CM

## 2023-08-03 ENCOUNTER — Other Ambulatory Visit: Payer: Self-pay | Admitting: Family Medicine

## 2023-08-03 DIAGNOSIS — I10 Essential (primary) hypertension: Secondary | ICD-10-CM

## 2023-08-13 ENCOUNTER — Encounter (INDEPENDENT_AMBULATORY_CARE_PROVIDER_SITE_OTHER): Payer: Self-pay

## 2023-09-10 ENCOUNTER — Other Ambulatory Visit: Payer: Self-pay | Admitting: Family Medicine

## 2023-09-13 NOTE — Assessment & Plan Note (Signed)
Supplement and monitor 

## 2023-09-13 NOTE — Assessment & Plan Note (Signed)
Well controlled, no changes to meds. Encouraged heart healthy diet such as the DASH diet and exercise as tolerated.  °

## 2023-09-13 NOTE — Assessment & Plan Note (Signed)
Encouraged DASH or MIND diet, decrease po intake and increase exercise as tolerated. Needs 7-8 hours of sleep nightly. Avoid trans fats, eat small, frequent meals every 4-5 hours with lean proteins, complex carbs and healthy fats. Minimize simple carbs, high fat foods and processed foods 

## 2023-09-13 NOTE — Assessment & Plan Note (Addendum)
Patient encouraged to maintain heart healthy diet, regular exercise, adequate sleep. Consider daily probiotics. Take medications as prescribed. Given ACP packet. Labs ordered and reviewed. Referred to OB for pap wants MCP. Last [facility] 10/2022 repeat annually, needs colonoscopy, agrees to Cologuard which she has at home

## 2023-09-13 NOTE — Assessment & Plan Note (Signed)
hgba1c acceptable, minimize simple carbs. Increase exercise as tolerated. Continue current meds 

## 2023-09-13 NOTE — Assessment & Plan Note (Signed)
Hydrate and monitor 

## 2023-09-13 NOTE — Assessment & Plan Note (Signed)
Encourage heart healthy diet such as MIND or DASH diet, increase exercise, avoid trans fats, simple carbohydrates and processed foods, consider a krill or fish or flaxseed oil cap daily. Tolerating Atorvastatin

## 2023-09-14 ENCOUNTER — Ambulatory Visit: Admitting: Family Medicine

## 2023-09-14 VITALS — BP 130/80 | HR 67 | Temp 97.8°F | Resp 16 | Ht 69.0 in | Wt 282.2 lb

## 2023-09-14 DIAGNOSIS — Z23 Encounter for immunization: Secondary | ICD-10-CM | POA: Diagnosis not present

## 2023-09-14 DIAGNOSIS — E782 Mixed hyperlipidemia: Secondary | ICD-10-CM | POA: Diagnosis not present

## 2023-09-14 DIAGNOSIS — Z7984 Long term (current) use of oral hypoglycemic drugs: Secondary | ICD-10-CM

## 2023-09-14 DIAGNOSIS — Z1231 Encounter for screening mammogram for malignant neoplasm of breast: Secondary | ICD-10-CM

## 2023-09-14 DIAGNOSIS — E669 Obesity, unspecified: Secondary | ICD-10-CM

## 2023-09-14 DIAGNOSIS — E559 Vitamin D deficiency, unspecified: Secondary | ICD-10-CM | POA: Diagnosis not present

## 2023-09-14 DIAGNOSIS — I10 Essential (primary) hypertension: Secondary | ICD-10-CM

## 2023-09-14 DIAGNOSIS — E1169 Type 2 diabetes mellitus with other specified complication: Secondary | ICD-10-CM | POA: Diagnosis not present

## 2023-09-14 DIAGNOSIS — R252 Cramp and spasm: Secondary | ICD-10-CM | POA: Diagnosis not present

## 2023-09-14 DIAGNOSIS — Z124 Encounter for screening for malignant neoplasm of cervix: Secondary | ICD-10-CM

## 2023-09-14 DIAGNOSIS — Z0000 Encounter for general adult medical examination without abnormal findings: Secondary | ICD-10-CM | POA: Diagnosis not present

## 2023-09-14 LAB — CBC WITH DIFFERENTIAL/PLATELET
Basophils Absolute: 0 10*3/uL (ref 0.0–0.1)
Basophils Relative: 0.6 % (ref 0.0–3.0)
Eosinophils Absolute: 0.1 10*3/uL (ref 0.0–0.7)
Eosinophils Relative: 1.9 % (ref 0.0–5.0)
HCT: 39.4 % (ref 36.0–46.0)
Hemoglobin: 12.6 g/dL (ref 12.0–15.0)
Lymphocytes Relative: 32.4 % (ref 12.0–46.0)
Lymphs Abs: 2.3 10*3/uL (ref 0.7–4.0)
MCHC: 31.9 g/dL (ref 30.0–36.0)
MCV: 93.9 fl (ref 78.0–100.0)
Monocytes Absolute: 0.4 10*3/uL (ref 0.1–1.0)
Monocytes Relative: 6.2 % (ref 3.0–12.0)
Neutro Abs: 4.2 10*3/uL (ref 1.4–7.7)
Neutrophils Relative %: 58.9 % (ref 43.0–77.0)
Platelets: 263 10*3/uL (ref 150.0–400.0)
RBC: 4.2 Mil/uL (ref 3.87–5.11)
RDW: 12.6 % (ref 11.5–15.5)
WBC: 7.1 10*3/uL (ref 4.0–10.5)

## 2023-09-14 LAB — COMPREHENSIVE METABOLIC PANEL
ALT: 21 U/L (ref 0–35)
AST: 22 U/L (ref 0–37)
Albumin: 3.8 g/dL (ref 3.5–5.2)
Alkaline Phosphatase: 82 U/L (ref 39–117)
BUN: 25 mg/dL — ABNORMAL HIGH (ref 6–23)
CO2: 27 meq/L (ref 19–32)
Calcium: 9.1 mg/dL (ref 8.4–10.5)
Chloride: 101 meq/L (ref 96–112)
Creatinine, Ser: 1.3 mg/dL — ABNORMAL HIGH (ref 0.40–1.20)
GFR: 47.07 mL/min — ABNORMAL LOW (ref >60.00)
Glucose, Bld: 212 mg/dL — ABNORMAL HIGH (ref 70–99)
Potassium: 4.8 meq/L (ref 3.5–5.1)
Sodium: 135 meq/L (ref 135–145)
Total Bilirubin: 0.8 mg/dL (ref 0.2–1.2)
Total Protein: 6.7 g/dL (ref 6.0–8.3)

## 2023-09-14 LAB — LIPID PANEL
Cholesterol: 75 mg/dL (ref 0–200)
HDL: 17.8 mg/dL — ABNORMAL LOW (ref >39.00)
LDL Cholesterol: 1 mg/dL (ref 0–99)
NonHDL: 57.51
Total CHOL/HDL Ratio: 4
Triglycerides: 282 mg/dL — ABNORMAL HIGH (ref 0.0–149.0)
VLDL: 56.4 mg/dL — ABNORMAL HIGH (ref 0.0–40.0)

## 2023-09-14 LAB — VITAMIN D 25 HYDROXY (VIT D DEFICIENCY, FRACTURES): VITD: 66.45 ng/mL (ref 30.00–100.00)

## 2023-09-14 LAB — HEMOGLOBIN A1C: Hgb A1c MFr Bld: 9.7 % — ABNORMAL HIGH (ref 4.6–6.5)

## 2023-09-14 LAB — MAGNESIUM: Magnesium: 1.3 mg/dL — ABNORMAL LOW (ref 1.5–2.5)

## 2023-09-14 LAB — TSH: TSH: 2.21 u[IU]/mL (ref 0.35–5.50)

## 2023-09-14 NOTE — Patient Instructions (Signed)
[employer]   4000, 8000 steps  Preventive Care 64-53 Years Old, Female Preventive care refers to lifestyle choices and visits with your health care provider that can promote health and wellness. Preventive care visits are also called wellness exams. What can I expect for my preventive care visit? Counseling Your health care provider may ask you questions about your: Medical history, including: Past medical problems. Family medical history. Pregnancy history. Current health, including: Menstrual cycle. Method of birth control. Emotional well-being. Home life and relationship well-being. Sexual activity and sexual health. Lifestyle, including: Alcohol, nicotine or tobacco, and drug use. Access to firearms. Diet, exercise, and sleep habits. Work and work Astronomer. Sunscreen use. Safety issues such as seatbelt and bike helmet use. Physical exam Your health care provider will check your: Height and weight. These may be used to calculate your BMI (body mass index). BMI is a measurement that tells if you are at a healthy weight. Waist circumference. This measures the distance around your waistline. This measurement also tells if you are at a healthy weight and may help predict your risk of certain diseases, such as type 2 diabetes and high blood pressure. Heart rate and blood pressure. Body temperature. Skin for abnormal spots. What immunizations do I need?  Vaccines are usually given at various ages, according to a schedule. Your health care provider will recommend vaccines for you based on your age, medical history, and lifestyle or other factors, such as travel or where you work. What tests do I need? Screening Your health care provider may recommend screening tests for certain conditions. This may include: Lipid and cholesterol levels. Diabetes screening. This is done by checking your blood sugar (glucose) after you have not eaten for a while (fasting). Pelvic  exam and Pap test. Hepatitis B test. Hepatitis C test. HIV (human immunodeficiency virus) test. STI (sexually transmitted infection) testing, if you are at risk. Lung cancer screening. Colorectal cancer screening. Mammogram. Talk with your health care provider about when you should start having regular mammograms. This may depend on whether you have a family history of breast cancer. BRCA-related cancer screening. This may be done if you have a family history of breast, ovarian, tubal, or peritoneal cancers. Bone density scan. This is done to screen for osteoporosis. Talk with your health care provider about your test results, treatment options, and if necessary, the need for more tests. Follow these instructions at home: Eating and drinking  Eat a diet that includes fresh fruits and vegetables, whole grains, lean protein, and low-fat dairy products. Take vitamin and mineral supplements as recommended by your health care provider. Do not drink alcohol if: Your health care provider tells you not to drink. You are pregnant, may be pregnant, or are planning to become pregnant. If you drink alcohol: Limit how much you have to 0-1 drink a day. Know how much alcohol is in your drink. In the [place] one drink equals one 12 oz bottle of beer (355 mL), one 5 oz glass of wine (148 mL), or one 1 oz glass of hard liquor (44 mL). Lifestyle Brush your teeth every morning and night with fluoride toothpaste. Floss one time each day. Exercise for at least 30 minutes 5 or more days each week. Do not use any products that contain nicotine or tobacco. These products include cigarettes, chewing tobacco, and vaping devices, such as e-cigarettes. If you need help quitting, ask your health care provider. Do not use drugs. If you are sexually active, practice safe  sex. Use a condom or other form of protection to prevent STIs. If you do not wish to become pregnant, use a form of birth control. If you plan to become  pregnant, see your health care provider for a prepregnancy visit. Take aspirin only as told by your health care provider. Make sure that you understand how much to take and what form to take. Work with your health care provider to find out whether it is safe and beneficial for you to take aspirin daily. Find healthy ways to manage stress, such as: Meditation, yoga, or listening to music. Journaling. Talking to a trusted person. Spending time with friends and family. Minimize exposure to UV radiation to reduce your risk of skin cancer. Safety Always wear your seat belt while driving or riding in a vehicle. Do not drive: If you have been drinking alcohol. Do not ride with someone who has been drinking. When you are tired or distracted. While texting. If you have been using any mind-altering substances or drugs. Wear a helmet and other protective equipment during sports activities. If you have firearms in your house, make sure you follow all gun safety procedures. Seek help if you have been physically or sexually abused. What's next? Visit your health care provider once a year for an annual wellness visit. Ask your health care provider how often you should have your eyes and teeth checked. Stay up to date on all vaccines. This information is not intended to replace advice given to you by your health care provider. Make sure you discuss any questions you have with your health care provider. Document Revised: 06/12/2021 Document Reviewed: 06/12/2021 Elsevier Patient Education  2024 [employer].

## 2023-09-16 MED ORDER — OZEMPIC (0.25 OR 0.5 MG/DOSE) 2 MG/3ML ~~LOC~~ SOPN: 0.2500 mg | PEN_INJECTOR | SUBCUTANEOUS | 2 refills | Status: DC

## 2023-09-16 NOTE — Progress Notes (Signed)
Subjective:    Patient ID: April Warren, female    DOB: 10-17-1971, 52 y.o.   MRN: [id number]  Chief Complaint  Patient presents with   Annual Exam    Annual Exam    HPI Discussed the use of AI scribe software for clinical note transcription with the patient, who gave verbal consent to proceed.  History of Present Illness   The patient, with a history of diabetes and weight issues, presents with concerns about taking Ozempic due to potential side effects, particularly related to her eyes. She expresses hesitation about starting the medication after reading about the side effects and seeing commercials about it. She also expresses concern about her weight, mentioning that she has gained back the ten pounds she had previously lost. The patient reports she has been drinking more water recently but has also been consuming sweet tea. She mentions she has been trying to increase her physical activity but finds it challenging due to her weight and the demands of caring for young children. The patient also mentions she has not yet had her flu shot and is considering getting it. She has not taken her colonoscopy and Pap smear tests yet and expresses some hesitation about these as well.        Past Medical History:  Diagnosis Date   Allergy    Asthma    Certain time of the year   Chicken pox 52 yrs old   Diabetes mellitus 08/12/2012   type 2- dr Ashley Royalty diagnosed   Diabetic retinopathy (HCC)    Dysmenorrhea 08/20/2012   HTN (hypertension) 08/20/2012   Hyperlipidemia, mixed 08/28/2013   Neuromuscular disorder (HCC)    Not sure of date   Neuropathy 02/16/2017   Obesity    Obesity, unspecified 08/28/2013   Other and unspecified hyperlipidemia 08/28/2013   Preventative health care 08/20/2012   Sinusitis 11/14/2013   Sinusitis, acute 05/02/2016    Past Surgical History:  Procedure Laterality Date   EYE SURGERY     Laser surgery for bleeds   REFRACTIVE SURGERY  08/12/2012   right,  left on 09/16/12    Family History  Problem Relation Age of Onset   Cancer Mother 26       breast   Diabetes Mother        type 2   Hypertension Mother    Hypertension Father    Hyperlipidemia Father    COPD Father    Arthritis Father    Asthma Father    Hypertension Maternal Grandmother    Hyperlipidemia Maternal Grandmother    COPD Maternal Grandmother    Asthma Maternal Grandmother    Hypertension Maternal Grandfather    Heart attack Maternal Grandfather    Stroke Maternal Grandfather    Gout Brother    Cancer Brother        lung   Heart disease Paternal Grandfather    Alcohol abuse Paternal Grandfather     Social History   Socioeconomic History   Marital status: Married    Spouse name: Not on file   Number of children: Not on file   Years of education: Not on file   Highest education level: Associate degree: academic program  Occupational History   Not on file  Tobacco Use   Smoking status: Never   Smokeless tobacco: Never  Substance and Sexual Activity   Alcohol use: Yes    Comment: One or two a year   Drug use: No   Sexual activity: Yes  Partners: Male    Birth control/protection: None  Other Topics Concern   Not on file  Social History Narrative   Lives with husband.    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    Not exercising   Social Determinants of Health   Financial Resource Strain: Low Risk  (04/29/2023)   Overall Financial Resource Strain (CARDIA)    Difficulty of Paying Living Expenses: Not hard at all  Food Insecurity: No Food Insecurity (04/29/2023)   Hunger Vital Sign    Worried About Running Out of Food in the Last Year: Never true    Ran Out of Food in the Last Year: Never true  Transportation Needs: No Transportation Needs (04/29/2023)   PRAPARE - Administrator, Civil Service (Medical): No    Lack of Transportation  (Non-Medical): No  Physical Activity: Unknown (04/29/2023)   Exercise Vital Sign    Days of Exercise per Week: 0 days    Minutes of Exercise per Session: Not on file  Stress: Stress Concern Present (04/29/2023)   [employer] of Occupational Health - Occupational Stress Questionnaire    Feeling of Stress : To some extent  Social Connections: Moderately Isolated (04/29/2023)   Social Connection and Isolation Panel [NHANES]    Frequency of Communication with Friends and Family: More than three times a week    Frequency of Social Gatherings with Friends and Family: Once a week    Attends Religious Services: Never    Database administrator or Organizations: No    Attends Engineer, structural: Not on file    Marital Status: Married  Catering manager Violence: Not on file    Outpatient Medications Prior to Visit  Medication Sig Dispense Refill   albuterol (PROAIR HFA) 108 (90 Base) MCG/ACT inhaler USE 2 INHALATIONS EVERY 6 HOURS AS NEEDED FOR WHEEZING OR SHORTNESS OF BREATH 18 g 5   atorvastatin (LIPITOR) 80 MG tablet TAKE 1 TABLET DAILY 90 tablet 3   Blood Glucose Monitoring Suppl (FREESTYLE FREEDOM LITE) w/Device KIT Check blood sugar twice daily 1 each 0   cetirizine (ZYRTEC) 10 MG tablet TAKE 1 TABLET DAILY AS NEEDED FOR ALLERGIES 90 tablet 3   ezetimibe (ZETIA) 10 MG tablet Take 1 tablet (10 mg total) by mouth daily. 90 tablet 3   fenofibrate micronized (LOFIBRA) 134 MG capsule TAKE 1 CAPSULE DAILY BEFORE BREAKFAST 90 capsule 3   gabapentin (NEURONTIN) 300 MG capsule TAKE 1 CAPSULE TWICE A DAY AND 2 CAPSULES AT BEDTIME 360 capsule 3   glipiZIDE (GLUCOTROL) 10 MG tablet TAKE ONE-HALF (1/2) TABLET TWICE A DAY BEFORE MEALS 90 tablet 0   glucose blood test strip Use as instructed 100 each 12   ibuprofen (ADVIL,MOTRIN) 200 MG tablet Take 400 mg by mouth every 8 (eight) hours as needed.     JANUVIA 100 MG tablet TAKE 1 TABLET DAILY 90 tablet 3   Lancets (FREESTYLE) lancets Check  blood sugar twice daily 200 each 5   lisinopril (ZESTRIL) 10 MG tablet Take 1 tablet (10 mg total) by mouth 2 (two) times daily. 180 tablet 3   LORazepam (ATIVAN) 1 MG tablet TAKE 1/2 TABLET BY MOUTH TWICE DAILY AS NEEDED FOR ANXIETY 90 tablet 1   Magnesium Oxide 400 MG CAPS Take 1 capsule by mouth daily. Pt is taking 825mg  capsule every other day.     metFORMIN (GLUCOPHAGE-XR)  750 MG 24 hr tablet TAKE 1 TABLET THREE TIMES A DAY 270 tablet 3   metoprolol tartrate (LOPRESSOR) 50 MG tablet TAKE 1 TABLET TWICE A DAY 180 tablet 3   montelukast (SINGULAIR) 10 MG tablet TAKE 1 TABLET AT BEDTIME 90 tablet 3   triamterene-hydrochlorothiazide (MAXZIDE-25) 37.5-25 MG tablet TAKE 1 TABLET DAILY 90 tablet 3   Dulaglutide (TRULICITY) 3 MG/0.5ML SOPN Inject 3 mg as directed once a week. 2 mL 1   fluconazole (DIFLUCAN) 150 MG tablet Take 1 tablet (150 mg total) by mouth once a week. 1 tablet 0   No facility-administered medications prior to visit.    No Known Allergies  Review of Systems  Constitutional:  Positive for malaise/fatigue. Negative for chills and fever.  HENT:  Negative for congestion and hearing loss.   Eyes:  Negative for discharge.  Respiratory:  Negative for cough, sputum production and shortness of breath.   Cardiovascular:  Negative for chest pain, palpitations and leg swelling.  Gastrointestinal:  Negative for abdominal pain, blood in stool, constipation, diarrhea, heartburn, nausea and vomiting.  Genitourinary:  Negative for dysuria, frequency, hematuria and urgency.  Musculoskeletal:  Negative for back pain, falls and myalgias.  Skin:  Negative for rash.  Neurological:  Negative for dizziness, sensory change, loss of consciousness, weakness and headaches.  Endo/Heme/Allergies:  Negative for environmental allergies. Does not bruise/bleed easily.  Psychiatric/Behavioral:  Negative for depression and suicidal ideas. The patient is not nervous/anxious and does not have insomnia.         Objective:    Physical Exam Constitutional:      General: She is not in acute distress.    Appearance: Normal appearance. She is not diaphoretic.  HENT:     Head: Normocephalic and atraumatic.     Right Ear: Tympanic membrane, ear canal and external ear normal.     Left Ear: Tympanic membrane, ear canal and external ear normal.     Nose: Nose normal.     Mouth/Throat:     Mouth: Mucous membranes are moist.     Pharynx: Oropharynx is clear. No oropharyngeal exudate.  Eyes:     General: No scleral icterus.       Right eye: No discharge.        Left eye: No discharge.     Conjunctiva/sclera: Conjunctivae normal.     Pupils: Pupils are equal, round, and reactive to light.  Neck:     Thyroid: No thyromegaly.  Cardiovascular:     Rate and Rhythm: Normal rate and regular rhythm.     Heart sounds: Normal heart sounds. No murmur heard. Pulmonary:     Effort: Pulmonary effort is normal. No respiratory distress.     Breath sounds: Normal breath sounds. No wheezing or rales.  Abdominal:     General: Bowel sounds are normal. There is no distension.     Palpations: Abdomen is soft. There is no mass.     Tenderness: There is no abdominal tenderness.  Musculoskeletal:        General: No tenderness. Normal range of motion.     Cervical back: Normal range of motion and neck supple.  Lymphadenopathy:     Cervical: No cervical adenopathy.  Skin:    General: Skin is warm and dry.     Findings: No rash.  Neurological:     General: No focal deficit present.     Mental Status: She is alert and oriented to person, place, and time.     Cranial  Nerves: No cranial nerve deficit.     Coordination: Coordination normal.     Deep Tendon Reflexes: Reflexes are normal and symmetric. Reflexes normal.  Psychiatric:        Mood and Affect: Mood normal.        Behavior: Behavior normal.        Thought Content: Thought content normal.        Judgment: Judgment normal.     BP 130/80 (BP Location:  Left Arm, Patient Position: Sitting, Cuff Size: Normal)   Pulse 67   Temp 97.8 F (36.6 C) (Oral)   Resp 16   Ht 5\' 9"  (1.753 m)   Wt 282 lb 3.2 oz (128 kg)   SpO2 99%   BMI 41.67 kg/m  Wt Readings from Last 3 Encounters:  09/14/23 282 lb 3.2 oz (128 kg)  04/30/23 273 lb (123.8 kg)  02/16/23 272 lb (123.4 kg)    Diabetic Foot Exam - Simple   No data filed    Lab Results  Component Value Date   WBC 7.1 09/14/2023   HGB 12.6 09/14/2023   HCT 39.4 09/14/2023   PLT 263.0 09/14/2023   GLUCOSE 212 (H) 09/14/2023   CHOL 75 09/14/2023   TRIG 282.0 (H) 09/14/2023   HDL 17.80 (L) 09/14/2023   LDLDIRECT 52.0 01/22/2023   LDLCALC 1 09/14/2023   ALT 21 09/14/2023   AST 22 09/14/2023   NA 135 09/14/2023   K 4.8 09/14/2023   CL 101 09/14/2023   CREATININE 1.30 (H) 09/14/2023   BUN 25 (H) 09/14/2023   CO2 27 09/14/2023   TSH 2.21 09/14/2023   INR 1.03 03/04/2011   HGBA1C 9.7 (H) 09/14/2023   MICROALBUR 1.3 04/30/2023    Lab Results  Component Value Date   TSH 2.21 09/14/2023   Lab Results  Component Value Date   WBC 7.1 09/14/2023   HGB 12.6 09/14/2023   HCT 39.4 09/14/2023   MCV 93.9 09/14/2023   PLT 263.0 09/14/2023   Lab Results  Component Value Date   NA 135 09/14/2023   K 4.8 09/14/2023   CO2 27 09/14/2023   GLUCOSE 212 (H) 09/14/2023   BUN 25 (H) 09/14/2023   CREATININE 1.30 (H) 09/14/2023   BILITOT 0.8 09/14/2023   ALKPHOS 82 09/14/2023   AST 22 09/14/2023   ALT 21 09/14/2023   PROT 6.7 09/14/2023   ALBUMIN 3.8 09/14/2023   CALCIUM 9.1 09/14/2023   ANIONGAP 5 02/16/2023   GFR 47.07 (L) 09/14/2023   Lab Results  Component Value Date   CHOL 75 09/14/2023   Lab Results  Component Value Date   HDL 17.80 (L) 09/14/2023   Lab Results  Component Value Date   LDLCALC 1 09/14/2023   Lab Results  Component Value Date   TRIG 282.0 (H) 09/14/2023   Lab Results  Component Value Date   CHOLHDL 4 09/14/2023   Lab Results  Component Value Date    HGBA1C 9.7 (H) 09/14/2023       Assessment & Plan:  Primary hypertension Assessment & Plan: Well controlled, no changes to meds. Encouraged heart healthy diet such as the DASH diet and exercise as tolerated.   Orders: -     Comprehensive metabolic panel -     CBC with Differential/Platelet -     TSH  Hyperlipidemia, mixed Assessment & Plan: Encourage heart healthy diet such as MIND or DASH diet, increase exercise, avoid trans fats, simple carbohydrates and processed foods, consider a krill or  fish or flaxseed oil cap daily. Tolerating Atorvastatin  Orders: -     Lipid panel  Muscle cramps Assessment & Plan: Hydrate and monitor  Orders: -     Magnesium  Obesity, unspecified classification, unspecified obesity type, unspecified whether serious comorbidity present Assessment & Plan: Encouraged DASH or MIND diet, decrease po intake and increase exercise as tolerated. Needs 7-8 hours of sleep nightly. Avoid trans fats, eat small, frequent meals every 4-5 hours with lean proteins, complex carbs and healthy fats. Minimize simple carbs, high fat foods and processed foods.   Type 2 diabetes mellitus with obesity (HCC) Assessment & Plan: hgba1c acceptable, minimize simple carbs. Increase exercise as tolerated. Continue current meds  Orders: -     Hemoglobin A1c -     TSH  Vitamin D deficiency Assessment & Plan: Supplement and monitor   Orders: -     VITAMIN D 25 Hydroxy (Vit-D Deficiency, Fractures)  Preventative health care Assessment & Plan: Patient encouraged to maintain heart healthy diet, regular exercise, adequate sleep. Consider daily probiotics. Take medications as prescribed. Given ACP packet. Labs ordered and reviewed. Referred to OB for pap wants MCP. Last [facility] 10/2022 repeat annually, needs colonoscopy, agrees to Cologuard which she has at home   Encounter for screening mammogram for malignant neoplasm of breast -     3D Screening Mammogram, Left and Right;  Future  Cervical cancer screening -     Ambulatory referral to Obstetrics / Gynecology  Need for influenza vaccination -     Flu vaccine trivalent PF, 6mos and older(Flulaval,Afluria,Fluarix,Fluzone)    Assessment and Plan    Diabetes Patient has concerns about potential side effects of Ozempic, particularly vision issues. Discussed the benefits of Ozempic in controlling blood sugar and improving long-term kidney and heart outcomes. Patient expressed preference for Trulicity, but was informed of similar side effect profiles. Discussed potential natural alternatives, but emphasized lack of control and potential risks. -Encouraged patient to consider starting Ozempic for better blood sugar control and potential weight loss benefits.  Weight Management Patient has gained back previously lost weight. Discussed the potential benefits of Ozempic for weight loss. Emphasized the importance of lifestyle modifications including diet and exercise. -Encouraged patient to consider starting Ozempic for potential weight loss benefits.  Colon Cancer Screening Discussed the importance of colon cancer screening. Patient has had difficulty with previous Cologuard test due to shipping issues. -Order Cologuard test and ensure it is sent to the correct location.  Mammogram Last mammogram was in November 2023. Discussed the importance of regular mammograms for breast cancer screening. -Order mammogram to be done in November 2024.  Influenza Vaccination Patient has not yet received flu shot for the current season. -Administer flu shot today.  COVID-19 Vaccination Discussed the benefits of receiving a COVID-19 booster shot. Patient to consider and potentially receive shot two weeks after flu shot. -Encourage patient to receive COVID-19 booster shot.  Arthritis Patient has noticed bony growths on knees. Assessed as likely arthritic spurs. -No specific intervention needed at this time, monitor for  changes.  Advanced Care Planning Patient has started filling out advanced care planning packet with husband. -Encourage patient to continue with advanced care planning.  Family History No changes in family history. -No specific intervention needed at this time.  Asthma Discussed patient's child's asthma management. -Resend albuterol inhaler prescription if helpful.         Danise Edge, MD

## 2023-09-29 ENCOUNTER — Other Ambulatory Visit: Payer: Self-pay | Admitting: Family Medicine

## 2023-10-01 ENCOUNTER — Encounter: Payer: Self-pay | Admitting: Family Medicine

## 2023-10-13 ENCOUNTER — Other Ambulatory Visit: Payer: Self-pay

## 2023-10-13 ENCOUNTER — Telehealth: Payer: Self-pay

## 2023-10-13 DIAGNOSIS — R252 Cramp and spasm: Secondary | ICD-10-CM

## 2023-10-13 DIAGNOSIS — R790 Abnormal level of blood mineral: Secondary | ICD-10-CM

## 2023-10-13 NOTE — Telephone Encounter (Signed)
Pt called for lab appt recheck Magnesium.  Labs ordered and appt scheduled.

## 2023-10-14 ENCOUNTER — Other Ambulatory Visit

## 2023-10-26 ENCOUNTER — Other Ambulatory Visit: Payer: Self-pay | Admitting: Family Medicine

## 2023-10-26 DIAGNOSIS — E1169 Type 2 diabetes mellitus with other specified complication: Secondary | ICD-10-CM

## 2023-11-09 ENCOUNTER — Other Ambulatory Visit: Payer: Self-pay | Admitting: Family Medicine

## 2023-11-16 ENCOUNTER — Inpatient Hospital Stay (HOSPITAL_BASED_OUTPATIENT_CLINIC_OR_DEPARTMENT_OTHER): Admission: RE | Admit: 2023-11-16 | Source: Ambulatory Visit

## 2023-12-09 ENCOUNTER — Other Ambulatory Visit: Payer: Self-pay | Admitting: Family Medicine

## 2023-12-09 DIAGNOSIS — I10 Essential (primary) hypertension: Secondary | ICD-10-CM

## 2023-12-10 ENCOUNTER — Other Ambulatory Visit: Payer: Self-pay | Admitting: Family Medicine

## 2023-12-11 ENCOUNTER — Other Ambulatory Visit: Payer: Self-pay | Admitting: Family Medicine

## 2024-01-17 NOTE — Assessment & Plan Note (Signed)
Encouraged DASH or MIND diet, decrease po intake and increase exercise as tolerated. Needs 7-8 hours of sleep nightly. Avoid trans fats, eat small, frequent meals every 4-5 hours with lean proteins, complex carbs and healthy fats. Minimize simple carbs, high fat foods and processed foods 

## 2024-01-17 NOTE — Assessment & Plan Note (Signed)
Encourage heart healthy diet such as MIND or DASH diet, increase exercise, avoid trans fats, simple carbohydrates and processed foods, consider a krill or fish or flaxseed oil cap daily. Tolerating Atorvastatin 

## 2024-01-17 NOTE — Assessment & Plan Note (Signed)
Supplement and monitor 

## 2024-01-17 NOTE — Assessment & Plan Note (Signed)
Allowed refill of Alprazolam to use prn 

## 2024-01-17 NOTE — Assessment & Plan Note (Signed)
Well controlled, no changes to meds. Encouraged heart healthy diet such as the DASH diet and exercise as tolerated.  °

## 2024-01-17 NOTE — Assessment & Plan Note (Signed)
hgba1c acceptable, minimize simple carbs. Increase exercise as tolerated. Continue current meds 

## 2024-01-18 ENCOUNTER — Encounter (HOSPITAL_BASED_OUTPATIENT_CLINIC_OR_DEPARTMENT_OTHER): Payer: Self-pay

## 2024-01-18 ENCOUNTER — Encounter: Payer: Self-pay | Admitting: Family Medicine

## 2024-01-18 ENCOUNTER — Ambulatory Visit: Admitting: Family Medicine

## 2024-01-18 ENCOUNTER — Ambulatory Visit (HOSPITAL_BASED_OUTPATIENT_CLINIC_OR_DEPARTMENT_OTHER)
Admission: RE | Admit: 2024-01-18 | Discharge: 2024-01-18 | Disposition: A | Discharge status: Home / Self Care | Source: Ambulatory Visit | Attending: Family Medicine | Admitting: Family Medicine

## 2024-01-18 VITALS — BP 118/74 | HR 67 | Temp 98.2°F | Resp 16 | Ht 70.0 in | Wt 275.2 lb

## 2024-01-18 DIAGNOSIS — E669 Obesity, unspecified: Secondary | ICD-10-CM

## 2024-01-18 DIAGNOSIS — I10 Essential (primary) hypertension: Secondary | ICD-10-CM

## 2024-01-18 DIAGNOSIS — R059 Cough, unspecified: Secondary | ICD-10-CM

## 2024-01-18 DIAGNOSIS — M791 Myalgia, unspecified site: Secondary | ICD-10-CM | POA: Diagnosis not present

## 2024-01-18 DIAGNOSIS — E1169 Type 2 diabetes mellitus with other specified complication: Secondary | ICD-10-CM | POA: Diagnosis not present

## 2024-01-18 DIAGNOSIS — Z1231 Encounter for screening mammogram for malignant neoplasm of breast: Secondary | ICD-10-CM | POA: Diagnosis present

## 2024-01-18 DIAGNOSIS — R6889 Other general symptoms and signs: Secondary | ICD-10-CM

## 2024-01-18 DIAGNOSIS — R051 Acute cough: Secondary | ICD-10-CM

## 2024-01-18 DIAGNOSIS — F419 Anxiety disorder, unspecified: Secondary | ICD-10-CM

## 2024-01-18 DIAGNOSIS — R0989 Other specified symptoms and signs involving the circulatory and respiratory systems: Secondary | ICD-10-CM | POA: Diagnosis not present

## 2024-01-18 DIAGNOSIS — E782 Mixed hyperlipidemia: Secondary | ICD-10-CM | POA: Diagnosis not present

## 2024-01-18 DIAGNOSIS — Z124 Encounter for screening for malignant neoplasm of cervix: Secondary | ICD-10-CM

## 2024-01-18 DIAGNOSIS — E559 Vitamin D deficiency, unspecified: Secondary | ICD-10-CM

## 2024-01-18 LAB — LIPID PANEL
Cholesterol: 83 mg/dL (ref 0–200)
HDL: 18.5 mg/dL — ABNORMAL LOW (ref >39.00)
LDL Cholesterol: 26 mg/dL (ref 0–99)
NonHDL: 64.38
Total CHOL/HDL Ratio: 4
Triglycerides: 193 mg/dL — ABNORMAL HIGH (ref 0.0–149.0)
VLDL: 38.6 mg/dL (ref 0.0–40.0)

## 2024-01-18 LAB — COMPREHENSIVE METABOLIC PANEL
ALT: 30 U/L (ref 0–35)
AST: 33 U/L (ref 0–37)
Albumin: 4.1 g/dL (ref 3.5–5.2)
Alkaline Phosphatase: 63 U/L (ref 39–117)
BUN: 27 mg/dL — ABNORMAL HIGH (ref 6–23)
CO2: 26 meq/L (ref 19–32)
Calcium: 8.9 mg/dL (ref 8.4–10.5)
Chloride: 104 meq/L (ref 96–112)
Creatinine, Ser: 1.51 mg/dL — ABNORMAL HIGH (ref 0.40–1.20)
GFR: 39.23 mL/min — ABNORMAL LOW (ref >60.00)
Glucose, Bld: 156 mg/dL — ABNORMAL HIGH (ref 70–99)
Potassium: 4.5 meq/L (ref 3.5–5.1)
Sodium: 137 meq/L (ref 135–145)
Total Bilirubin: 0.6 mg/dL (ref 0.2–1.2)
Total Protein: 6.8 g/dL (ref 6.0–8.3)

## 2024-01-18 LAB — MICROALBUMIN / CREATININE URINE RATIO
Creatinine,U: 144.1 mg/dL
Microalb Creat Ratio: 2 mg/g (ref 0.0–30.0)
Microalb, Ur: 2.9 mg/dL — ABNORMAL HIGH (ref 0.0–1.9)

## 2024-01-18 LAB — HEMOGLOBIN A1C: Hgb A1c MFr Bld: 8.9 % — ABNORMAL HIGH (ref 4.6–6.5)

## 2024-01-18 LAB — CBC WITH DIFFERENTIAL/PLATELET
Basophils Absolute: 0.1 10*3/uL (ref 0.0–0.1)
Basophils Relative: 0.9 % (ref 0.0–3.0)
Eosinophils Absolute: 0.2 10*3/uL (ref 0.0–0.7)
Eosinophils Relative: 2 % (ref 0.0–5.0)
HCT: 38.7 % (ref 36.0–46.0)
Hemoglobin: 12.9 g/dL (ref 12.0–15.0)
Lymphocytes Relative: 29 % (ref 12.0–46.0)
Lymphs Abs: 2.3 10*3/uL (ref 0.7–4.0)
MCHC: 33.2 g/dL (ref 30.0–36.0)
MCV: 93 fL (ref 78.0–100.0)
Monocytes Absolute: 0.4 10*3/uL (ref 0.1–1.0)
Monocytes Relative: 5.5 % (ref 3.0–12.0)
Neutro Abs: 4.9 10*3/uL (ref 1.4–7.7)
Neutrophils Relative %: 62.6 % (ref 43.0–77.0)
Platelets: 290 10*3/uL (ref 150.0–400.0)
RBC: 4.16 Mil/uL (ref 3.87–5.11)
RDW: 12.7 % (ref 11.5–15.5)
WBC: 7.9 10*3/uL (ref 4.0–10.5)

## 2024-01-18 LAB — POCT INFLUENZA A/B
Influenza A, POC: NEGATIVE
Influenza B, POC: NEGATIVE

## 2024-01-18 LAB — TSH: TSH: 1.89 u[IU]/mL (ref 0.35–5.50)

## 2024-01-18 MED ORDER — FLUCONAZOLE 150 MG PO TABS: 150.0000 mg | ORAL_TABLET | ORAL | 0 refills | Status: DC

## 2024-01-18 MED ORDER — DOXYCYCLINE HYCLATE 100 MG PO TABS: 100.0000 mg | ORAL_TABLET | Freq: Two times a day (BID) | ORAL | 0 refills | Status: DC

## 2024-01-18 MED ORDER — HYDROCODONE BIT-HOMATROP MBR 5-1.5 MG/5ML PO SOLN: 5.0000 mL | Freq: Three times a day (TID) | ORAL | 0 refills | Status: DC | PRN

## 2024-01-18 NOTE — Patient Instructions (Addendum)
Multi with minerals zinc and selenium Vitamin D 2000 daily Flaxseed oil daily  Elderberry   Shingrix is the new shingles shot, 2 shots over 2-6 months, confirm coverage with insurance and document, then can return here for shots with nurse appt or at pharmacy   Ophthalmology Gynecology cologuard

## 2024-01-19 ENCOUNTER — Encounter: Payer: Self-pay | Admitting: Family Medicine

## 2024-01-19 NOTE — Progress Notes (Signed)
Subjective:    Patient ID: April Warren, female    DOB: 1971-02-20, 53 y.o.   MRN: [id number]  Chief Complaint  Patient presents with  . Follow-up    4 month    HPI Discussed the use of AI scribe software for clinical note transcription with the patient, who gave verbal consent to proceed.  History of Present Illness   The patient, with a history of diabetes, presents with symptoms of a respiratory infection that have been ongoing for approximately two to three days. Symptoms include body aches, cough, chest congestion, and head congestion. The patient reports feeling "cruddy" and has been taking extra vitamin C, zinc, and elderberry to manage symptoms. The patient also mentions experiencing pressure in the ears, but no fever. The patient's cough is severe enough to cause discomfort in the chest area.  In addition to the respiratory symptoms, the patient reports a recent fall at home resulting in sore knees. The fall occurred about two weeks ago when the patient slipped on bubbles left by a child in the household. The patient reports that the knees are slowly improving.  The patient also discusses the recent addition of a teenager to their household. The teenager has behavioral issues, including threats of self-harm, which has resulted in additional stress for the patient. The patient is currently taking lorazepam to manage anxiety.        Past Medical History:  Diagnosis Date  . Allergy   . Asthma    Certain time of the year  . Chicken pox 53 yrs old  . Diabetes mellitus 08/12/2012   type 2- dr Ashley Royalty diagnosed  . Diabetic retinopathy (HCC)   . Dysmenorrhea 08/20/2012  . HTN (hypertension) 08/20/2012  . Hyperlipidemia, mixed 08/28/2013  . Neuromuscular disorder [facility])    Not sure of date  . Neuropathy 02/16/2017  . Obesity   . Obesity, unspecified 08/28/2013  . Other and unspecified hyperlipidemia 08/28/2013  . Preventative health care 08/20/2012  . Sinusitis 11/14/2013   . Sinusitis, acute 05/02/2016    Past Surgical History:  Procedure Laterality Date  . EYE SURGERY     Laser surgery for bleeds  . REFRACTIVE SURGERY  08/12/2012   right, left on 09/16/12    Family History  Problem Relation Age of Onset  . Cancer Mother 60       breast  . Diabetes Mother        type 2  . Hypertension Mother   . Hypertension Father   . Hyperlipidemia Father   . COPD Father   . Arthritis Father   . Asthma Father   . Hypertension Maternal Grandmother   . Hyperlipidemia Maternal Grandmother   . COPD Maternal Grandmother   . Asthma Maternal Grandmother   . Hypertension Maternal Grandfather   . Heart attack Maternal Grandfather   . Stroke Maternal Grandfather   . Gout Brother   . Cancer Brother        lung  . Heart disease Paternal Grandfather   . Alcohol abuse Paternal Grandfather     Social History   Socioeconomic History  . Marital status: Married    Spouse name: Not on file  . Number of children: Not on file  . Years of education: Not on file  . Highest education level: Associate degree: academic program  Occupational History  . Not on file  Tobacco Use  . Smoking status: Never  . Smokeless tobacco: Never  Substance and Sexual Activity  . Alcohol use: Yes  Comment: One or two a year  . Drug use: No  . Sexual activity: Yes    Partners: Male    Birth control/protection: None  Other Topics Concern  . Not on file  Social History Narrative   Lives with husband.    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    Not exercising   Social Drivers of Health   Financial Resource Strain: Low Risk  (04/29/2023)   Overall Financial Resource Strain (CARDIA)   . Difficulty of Paying Living Expenses: Not hard at all  Food Insecurity: No Food Insecurity (04/29/2023)   Hunger Vital Sign   . Worried About Programme researcher, broadcasting/film/video in the Last Year: Never true   .  Ran Out of Food in the Last Year: Never true  Transportation Needs: No Transportation Needs (04/29/2023)   PRAPARE - Transportation   . Lack of Transportation (Medical): No   . Lack of Transportation (Non-Medical): No  Physical Activity: Unknown (04/29/2023)   Exercise Vital Sign   . Days of Exercise per Week: 0 days   . Minutes of Exercise per Session: Not on file  Stress: Stress Concern Present (04/29/2023)   [employer] of Occupational Health - Occupational Stress Questionnaire   . Feeling of Stress : To some extent  Social Connections: Moderately Isolated (04/29/2023)   Social Connection and Isolation Panel [NHANES]   . Frequency of Communication with Friends and Family: More than three times a week   . Frequency of Social Gatherings with Friends and Family: Once a week   . Attends Religious Services: Never   . Active Member of Clubs or Organizations: No   . Attends Banker Meetings: Not on file   . Marital Status: Married  Catering manager Violence: Not on file    Outpatient Medications Prior to Visit  Medication Sig Dispense Refill  . albuterol (PROAIR HFA) 108 (90 Base) MCG/ACT inhaler USE 2 INHALATIONS EVERY 6 HOURS AS NEEDED FOR WHEEZING OR SHORTNESS OF BREATH 18 g 5  . atorvastatin (LIPITOR) 80 MG tablet TAKE 1 TABLET DAILY 90 tablet 3  . Blood Glucose Monitoring Suppl (FREESTYLE FREEDOM LITE) w/Device KIT Check blood sugar twice daily 1 each 0  . cetirizine (ZYRTEC) 10 MG tablet TAKE 1 TABLET DAILY AS NEEDED FOR ALLERGIES 90 tablet 3  . ezetimibe (ZETIA) 10 MG tablet Take 1 tablet (10 mg total) by mouth daily. 90 tablet 0  . fenofibrate micronized (LOFIBRA) 134 MG capsule TAKE 1 CAPSULE DAILY BEFORE BREAKFAST 90 capsule 3  . gabapentin (NEURONTIN) 300 MG capsule TAKE 1 CAPSULE TWICE A DAY AND 2 CAPSULES AT BEDTIME 360 capsule 3  . glipiZIDE (GLUCOTROL) 10 MG tablet Take 0.5 tablets (5 mg total) by mouth 2 (two) times daily before a meal. 90 tablet 1  . glucose  blood test strip Use as instructed 100 each 12  . ibuprofen (ADVIL,MOTRIN) 200 MG tablet Take 400 mg by mouth every 8 (eight) hours as needed.    Marland Kitchen JANUVIA 100 MG tablet TAKE 1 TABLET DAILY 90 tablet 3  . Lancets (FREESTYLE) lancets Check blood sugar twice daily 200 each 5  . lisinopril (ZESTRIL) 10 MG tablet Take 1 tablet (10 mg total) by mouth 2 (two) times daily. 180 tablet 3  . lisinopril (ZESTRIL) 20 MG tablet TAKE 1 TABLET TWICE A DAY 180 tablet 3  . LORazepam (ATIVAN) 1 MG tablet  TAKE 1/2 TABLET BY MOUTH TWICE DAILY AS NEEDED FOR ANXIETY 90 tablet 1  . Magnesium Oxide 400 MG CAPS Take 1 capsule by mouth daily. Pt is taking 825mg  capsule every other day.    . metFORMIN (GLUCOPHAGE-XR) 750 MG 24 hr tablet TAKE 1 TABLET THREE TIMES A DAY 270 tablet 3  . metoprolol tartrate (LOPRESSOR) 50 MG tablet TAKE 1 TABLET TWICE A DAY 180 tablet 3  . montelukast (SINGULAIR) 10 MG tablet TAKE 1 TABLET AT BEDTIME 90 tablet 3  . Semaglutide,0.25 or 0.5MG /DOS, (OZEMPIC, 0.25 OR 0.5 MG/DOSE,) 2 MG/3ML SOPN Inject 0.25 mg into the skin once a week. 3 mL 2  . triamterene-hydrochlorothiazide (MAXZIDE-25) 37.5-25 MG tablet TAKE 1 TABLET DAILY 90 tablet 3  . Vitamin D, Ergocalciferol, (DRISDOL) 1.25 MG (50000 UNIT) CAPS capsule TAKE 1 CAPSULE EVERY 7 DAYS 12 capsule 3   No facility-administered medications prior to visit.    No Known Allergies  Review of Systems  Constitutional:  Negative for fever and malaise/fatigue.  HENT:  Positive for congestion and sinus pain.   Eyes:  Negative for blurred vision.  Respiratory:  Positive for cough and sputum production. Negative for shortness of breath.   Cardiovascular:  Negative for chest pain, palpitations and leg swelling.  Gastrointestinal:  Negative for abdominal pain, blood in stool and nausea.  Genitourinary:  Negative for dysuria and frequency.  Musculoskeletal:  Positive for falls and joint pain.  Skin:  Negative for rash.  Neurological:  Negative for  dizziness, loss of consciousness and headaches.  Endo/Heme/Allergies:  Negative for environmental allergies.  Psychiatric/Behavioral:  Negative for depression. The patient is not nervous/anxious.        Objective:    Physical Exam Constitutional:      General: She is not in acute distress.    Appearance: Normal appearance. She is well-developed. She is not toxic-appearing.  HENT:     Head: Normocephalic and atraumatic.     Right Ear: External ear normal.     Left Ear: External ear normal.     Nose: Nose normal.  Eyes:     General:        Right eye: No discharge.        Left eye: No discharge.     Conjunctiva/sclera: Conjunctivae normal.  Neck:     Thyroid: No thyromegaly.  Cardiovascular:     Rate and Rhythm: Normal rate and regular rhythm.     Heart sounds: Normal heart sounds. No murmur heard. Pulmonary:     Effort: Pulmonary effort is normal. No respiratory distress.     Breath sounds: Normal breath sounds.  Abdominal:     General: Bowel sounds are normal.     Palpations: Abdomen is soft.     Tenderness: There is no abdominal tenderness. There is no guarding.  Musculoskeletal:        General: Normal range of motion.     Cervical back: Neck supple.  Lymphadenopathy:     Cervical: No cervical adenopathy.  Skin:    General: Skin is warm and dry.  Neurological:     Mental Status: She is alert and oriented to person, place, and time.  Psychiatric:        Mood and Affect: Mood normal.        Behavior: Behavior normal.        Thought Content: Thought content normal.        Judgment: Judgment normal.   BP 118/74 (BP Location: Left Arm, Patient Position: Sitting,  Cuff Size: Large)   Pulse 67   Temp 98.2 F (36.8 C) (Oral)   Resp 16   Ht 5\' 10"  (1.778 m)   Wt 275 lb 3.2 oz (124.8 kg)   SpO2 97%   BMI 39.49 kg/m  Wt Readings from Last 3 Encounters:  01/18/24 275 lb 3.2 oz (124.8 kg)  09/14/23 282 lb 3.2 oz (128 kg)  04/30/23 273 lb (123.8 kg)    Diabetic Foot  Exam - Simple   No data filed    Lab Results  Component Value Date   WBC 7.9 01/18/2024   HGB 12.9 01/18/2024   HCT 38.7 01/18/2024   PLT 290.0 01/18/2024   GLUCOSE 156 (H) 01/18/2024   CHOL 83 01/18/2024   TRIG 193.0 (H) 01/18/2024   HDL 18.50 (L) 01/18/2024   LDLDIRECT 52.0 01/22/2023   LDLCALC 26 01/18/2024   ALT 30 01/18/2024   AST 33 01/18/2024   NA 137 01/18/2024   K 4.5 01/18/2024   CL 104 01/18/2024   CREATININE 1.51 (H) 01/18/2024   BUN 27 (H) 01/18/2024   CO2 26 01/18/2024   TSH 1.89 01/18/2024   INR 1.03 03/04/2011   HGBA1C 8.9 (H) 01/18/2024   MICROALBUR 2.9 (H) 01/18/2024    Lab Results  Component Value Date   TSH 1.89 01/18/2024   Lab Results  Component Value Date   WBC 7.9 01/18/2024   HGB 12.9 01/18/2024   HCT 38.7 01/18/2024   MCV 93.0 01/18/2024   PLT 290.0 01/18/2024   Lab Results  Component Value Date   NA 137 01/18/2024   K 4.5 01/18/2024   CO2 26 01/18/2024   GLUCOSE 156 (H) 01/18/2024   BUN 27 (H) 01/18/2024   CREATININE 1.51 (H) 01/18/2024   BILITOT 0.6 01/18/2024   ALKPHOS 63 01/18/2024   AST 33 01/18/2024   ALT 30 01/18/2024   PROT 6.8 01/18/2024   ALBUMIN 4.1 01/18/2024   CALCIUM 8.9 01/18/2024   ANIONGAP 5 02/16/2023   GFR 39.23 (L) 01/18/2024   Lab Results  Component Value Date   CHOL 83 01/18/2024   Lab Results  Component Value Date   HDL 18.50 (L) 01/18/2024   Lab Results  Component Value Date   LDLCALC 26 01/18/2024   Lab Results  Component Value Date   TRIG 193.0 (H) 01/18/2024   Lab Results  Component Value Date   CHOLHDL 4 01/18/2024   Lab Results  Component Value Date   HGBA1C 8.9 (H) 01/18/2024       Assessment & Plan:  Primary hypertension Assessment & Plan: Well controlled, no changes to meds. Encouraged heart healthy diet such as the DASH diet and exercise as tolerated.   Orders: -     Comprehensive metabolic panel -     CBC with Differential/Platelet -     TSH  Hyperlipidemia,  mixed Assessment & Plan: Encourage heart healthy diet such as MIND or DASH diet, increase exercise, avoid trans fats, simple carbohydrates and processed foods, consider a krill or fish or flaxseed oil cap daily. Tolerating Atorvastatin  Orders: -     Lipid panel  Obesity, unspecified class, unspecified obesity type, unspecified whether serious comorbidity present Assessment & Plan: Encouraged DASH or MIND diet, decrease po intake and increase exercise as tolerated. Needs 7-8 hours of sleep nightly. Avoid trans fats, eat small, frequent meals every 4-5 hours with lean proteins, complex carbs and healthy fats. Minimize simple carbs, high fat foods and processed foods.   Type 2  diabetes mellitus with obesity (HCC) Assessment & Plan: hgba1c acceptable, minimize simple carbs. Increase exercise as tolerated. Continue current meds  Orders: -     Hemoglobin A1c -     Microalbumin / creatinine urine ratio  Vitamin D deficiency Assessment & Plan: Supplement and monitor    Anxiety Assessment & Plan: Allowed refill of Alprazolam to use prn.    Acute cough  Cervical cancer screening -     Ambulatory referral to Obstetrics / Gynecology  Flu-like symptoms -     POCT Influenza A/B  Other orders -     Doxycycline Hyclate; Take 1 tablet (100 mg total) by mouth 2 (two) times daily.  Dispense: 20 tablet; Refill: 0 -     HYDROcodone Bit-Homatrop MBr; Take 5 mLs by mouth every 8 (eight) hours as needed for cough.  Dispense: 150 mL; Refill: 0 -     Fluconazole; Take 1 tablet (150 mg total) by mouth once a week.  Dispense: 2 tablet; Refill: 0    Assessment and Plan    Upper Respiratory Infection Symptoms of cough, chest congestion, and head congestion for 2-3 days. No fever. Green-yellow sputum. Ear pressure likely due to nasal congestion. Chest sounds clear on examination. -Perform flu test in office. -Prescribe Doxycycline and Diflucan. -Prescribe Hycodan for cough. -Advise patient to  take Mucinex to thin mucus. -Consider chest x-ray if symptoms persist or worsen.  Nutritional Deficiencies Discussed importance of micronutrients for immune response. Patient currently takes a multivitamin with probiotics. -Advise patient to ensure multivitamin contains minerals, specifically zinc and selenium. -Recommend additional Vitamin D supplementation of at least 2000 IU daily. -Advise patient to continue taking flaxseed oil for good fatty acids.  Preventive Health Patient is a diabetic and has not had recent eye exam, colon cancer screening, or cervical cancer screening. -Refer patient to ophthalmologist for eye exam. -Advise patient to complete Cologuard test for colon cancer screening. -Refer patient to GYN for Pap smear. -Advise patient to receive Shingrix vaccine for shingles prevention.  Follow-up Patient to return in 4-5 months for physical in September. Patient to contact office if not improving or if symptoms worsen.         Danise Edge, MD

## 2024-01-20 ENCOUNTER — Other Ambulatory Visit: Payer: Self-pay | Admitting: Family Medicine

## 2024-01-20 DIAGNOSIS — R928 Other abnormal and inconclusive findings on diagnostic imaging of breast: Secondary | ICD-10-CM

## 2024-01-22 ENCOUNTER — Encounter: Payer: Self-pay | Admitting: Emergency Medicine

## 2024-01-25 ENCOUNTER — Other Ambulatory Visit: Payer: Self-pay | Admitting: Family

## 2024-01-25 ENCOUNTER — Other Ambulatory Visit: Payer: Self-pay | Admitting: Family Medicine

## 2024-01-25 ENCOUNTER — Ambulatory Visit
Admission: RE | Admit: 2024-01-25 | Discharge: 2024-01-25 | Disposition: A | Discharge status: Home / Self Care | Source: Ambulatory Visit | Attending: Family Medicine | Admitting: Family Medicine

## 2024-01-25 DIAGNOSIS — R928 Other abnormal and inconclusive findings on diagnostic imaging of breast: Secondary | ICD-10-CM

## 2024-01-25 DIAGNOSIS — N6310 Unspecified lump in the right breast, unspecified quadrant: Secondary | ICD-10-CM

## 2024-01-25 MED ORDER — SEMAGLUTIDE (1 MG/DOSE) 4 MG/3ML ~~LOC~~ SOPN: 1.0000 mg | PEN_INJECTOR | SUBCUTANEOUS | 0 refills | Status: DC

## 2024-01-26 ENCOUNTER — Telehealth: Payer: Self-pay

## 2024-01-26 ENCOUNTER — Ambulatory Visit: Admitting: Family Medicine

## 2024-01-26 DIAGNOSIS — E1169 Type 2 diabetes mellitus with other specified complication: Secondary | ICD-10-CM

## 2024-01-26 MED ORDER — GLIPIZIDE 10 MG PO TABS: 5.0000 mg | ORAL_TABLET | Freq: Two times a day (BID) | ORAL | 1 refills | Status: AC

## 2024-01-26 NOTE — Telephone Encounter (Signed)
Pharmacy send rx refill for glipizide 10 mg and medication send into pharmacy.

## 2024-01-29 ENCOUNTER — Ambulatory Visit
Admission: RE | Admit: 2024-01-29 | Discharge: 2024-01-29 | Disposition: A | Discharge status: Home / Self Care | Source: Ambulatory Visit | Attending: Family Medicine | Admitting: Family Medicine

## 2024-01-29 DIAGNOSIS — N6310 Unspecified lump in the right breast, unspecified quadrant: Secondary | ICD-10-CM

## 2024-01-29 HISTORY — PX: BREAST BIOPSY: SHX20

## 2024-02-01 LAB — SURGICAL PATHOLOGY

## 2024-02-09 ENCOUNTER — Other Ambulatory Visit: Payer: Self-pay | Admitting: Family Medicine

## 2024-03-09 ENCOUNTER — Other Ambulatory Visit: Payer: Self-pay | Admitting: Family Medicine

## 2024-03-17 LAB — COLOGUARD: COLOGUARD: NEGATIVE

## 2024-03-18 ENCOUNTER — Encounter: Payer: Self-pay | Admitting: Family

## 2024-03-25 ENCOUNTER — Other Ambulatory Visit (HOSPITAL_COMMUNITY)
Admission: RE | Admit: 2024-03-25 | Discharge: 2024-03-25 | Disposition: A | Discharge status: Home / Self Care | Source: Ambulatory Visit | Attending: Obstetrics and Gynecology | Admitting: Obstetrics and Gynecology

## 2024-03-25 ENCOUNTER — Ambulatory Visit: Admitting: Obstetrics and Gynecology

## 2024-03-25 VITALS — BP 143/66 | HR 68 | Ht 69.0 in | Wt 274.0 lb

## 2024-03-25 DIAGNOSIS — Z1339 Encounter for screening examination for other mental health and behavioral disorders: Secondary | ICD-10-CM | POA: Diagnosis not present

## 2024-03-25 DIAGNOSIS — Z113 Encounter for screening for infections with a predominantly sexual mode of transmission: Secondary | ICD-10-CM | POA: Diagnosis not present

## 2024-03-25 DIAGNOSIS — Z01419 Encounter for gynecological examination (general) (routine) without abnormal findings: Secondary | ICD-10-CM | POA: Diagnosis present

## 2024-03-25 NOTE — Progress Notes (Signed)
 ANNUAL EXAM Patient name: April Warren MRN [id number]  Date of birth: Nov 04, 1971 Chief Complaint:   Gynecologic Exam  History of Present Illness:   April Warren is a 53 y.o. No obstetric history on file. being seen today for a routine annual exam.  Current complaints: annual   Discussed the use of AI scribe software for clinical note transcription with the patient, who gave verbal consent to proceed.  History of Present Illness April Warren is a 53 year old female who presents for an annual exam including a Pap smear.  She has not had a menstrual period in approximately two years, with some spotting occurring after the first missed period, but no bleeding for over a year, confirming menopause. No abnormal vaginal discharge and currently sexually active with a female partner, experiencing no dyspareunia. She experiences vaginal dryness and uses Premarin cream, which alleviates the dryness.  She mentions a previous scare with breast cancer, for which she underwent a mammogram and biopsy, both of which were clear. No current breast or nipple changes.  She is open to STD testing, including gonorrhea, chlamydia, HIV, syphilis, and hepatitis.  She has been engaging in exercises to lose weight and reports discomfort in her left hip, particularly during physical activity.    Patient's last menstrual period was 10/28/2014.   The pregnancy intention screening data noted above was reviewed. Potential methods of contraception were discussed. The patient elected to proceed with No data recorded.   Last pap     Component Value Date/Time   DIAGPAP  08/19/2018 0000    -  NEGATIVE FOR INTRAEPITHELIAL LESIONS OR MALIGNANCY   DIAGPAP -  CELLULAR CHANGES CONSISTENT WITH HYPERKERATOSIS 08/19/2018 0000   ADEQPAP  08/19/2018 0000    Satisfactory for evaluation  endocervical/transformation zone component PRESENT.    Last mammogram: 12/2023 BIRADS 4; 01/29/2024 breast biopsy > benign Last colonoscopy:  02/2024 Cologard negative     03/25/2024    9:19 AM 09/14/2023    9:55 AM 12/25/2022    9:11 AM 04/15/2022   11:03 AM 08/20/2021    9:39 AM  Depression screen PHQ 2/9  Decreased Interest 0 0 0 0 0  Down, Depressed, Hopeless 0 0 0 0 0  PHQ - 2 Score 0 0 0 0 0  Altered sleeping 1 0   0  Tired, decreased energy 1 0   2  Change in appetite 0 0   0  Feeling bad or failure about yourself  0 0   0  Trouble concentrating 0 0   0  Moving slowly or fidgety/restless 0 0   0  Suicidal thoughts 0 0   0  PHQ-9 Score 2 0   2  Difficult doing work/chores  Not difficult at all   Somewhat difficult        03/25/2024    9:19 AM 09/14/2023    9:56 AM  GAD 7 : Generalized Anxiety Score  Nervous, Anxious, on Edge 0 0  Control/stop worrying 1 0  Worry too much - different things 0 0  Trouble relaxing 0 0  Restless 0 0  Easily annoyed or irritable 0 0  Afraid - awful might happen 0 0  Total GAD 7 Score 1 0  Anxiety Difficulty  Not difficult at all     Review of Systems:   Pertinent items are noted in HPI Denies any headaches, blurred vision, fatigue, shortness of breath, chest pain, abdominal pain, abnormal vaginal discharge/itching/odor/irritation, problems with periods, bowel movements, urination,  or intercourse unless otherwise stated above. Pertinent History Reviewed:  Reviewed past medical,surgical, social and family history.  Reviewed problem list, medications and allergies. Physical Assessment:   Vitals:   03/25/24 0914  BP: (!) 143/66  Pulse: 68  Weight: 274 lb (124.3 kg)  Height: 5\' 9"  (1.753 m)  Body mass index is 40.46 kg/m.        Physical Examination:   General appearance - well appearing, and in no distress  Mental status - alert, oriented to person, place, and time  Psych:  She has a normal mood and affect  Skin - warm and dry, normal color, no suspicious lesions noted  Chest - effort normal, all lung fields clear to auscultation bilaterally  Heart - normal rate and  regular rhythm  Breasts - breasts appear normal, no suspicious masses, no skin or nipple changes or  axillary nodes  Abdomen - soft, nontender, nondistended, no masses or organomegaly  Pelvic -  VULVA: normal appearing vulva with no masses, tenderness or lesions   VAGINA: normal appearing vagina with normal color and discharge, no lesions   CERVIX: normal appearing cervix without discharge or lesions, no CMT  Thin prep pap is done with HR HPV cotesting  UTERUS: uterus is felt to be normal size, shape, consistency and nontender   ADNEXA: No adnexal masses or tenderness noted.  Extremities:  No swelling or varicosities noted  Chaperone present for exam  No results found for this or any previous visit (from the past 24 hours).    Assessment & Plan:   Assessment & Plan Annual gynecological examination Postmenopausal, no history of abnormal Pap smears, sexually active, consented to STD testing. - Cervical cancer screening: Discussed guidelines. Pap with HPV collected - Breast Health: Encouraged self breast awareness/SBE. Discussed limits of clinical breast exam for detecting breast cancer. Discussed importance of annual MXR.  S/p biopsy, recommended repeat right breast imaging in 6 months - Climacteric/Sexual health: Reviewed typical and atypical symptoms of menopause/peri-menopause. Discussed PMB and to call if any amount of spotting.  - Colon Cancer Screening: up to date - F/U 12 months and prn   Vaginal dryness Vaginal dryness due to menopause, relieved by Premarin cream. - Continue Premarin cream for vaginal dryness.    No orders of the defined types were placed in this encounter.   Meds: No orders of the defined types were placed in this encounter.   Follow-up: No follow-ups on file.  Lorriane Shire, MD 03/25/2024 9:29 AM

## 2024-03-26 LAB — RPR+HBSAG+HCVAB+...
HIV Screen 4th Generation wRfx: NONREACTIVE
Hep C Virus Ab: NONREACTIVE
Hepatitis B Surface Ag: NEGATIVE
RPR Ser Ql: NONREACTIVE

## 2024-03-28 ENCOUNTER — Encounter: Payer: Self-pay | Admitting: Obstetrics and Gynecology

## 2024-03-29 LAB — CYTOLOGY - PAP
Chlamydia: NEGATIVE
Comment: NEGATIVE
Comment: NEGATIVE
Comment: NEGATIVE
Comment: NORMAL
Diagnosis: NEGATIVE
High risk HPV: NEGATIVE
Neisseria Gonorrhea: NEGATIVE
Trichomonas: NEGATIVE

## 2024-05-02 ENCOUNTER — Other Ambulatory Visit: Payer: Self-pay | Admitting: Family

## 2024-05-02 ENCOUNTER — Other Ambulatory Visit: Payer: Self-pay | Admitting: Family Medicine

## 2024-06-01 ENCOUNTER — Other Ambulatory Visit: Payer: Self-pay | Admitting: Family Medicine

## 2024-06-08 NOTE — Assessment & Plan Note (Signed)
 Supplement and monitor

## 2024-06-08 NOTE — Assessment & Plan Note (Signed)
 hgba1c acceptable, minimize simple carbs. Increase exercise as tolerated. Continue current meds

## 2024-06-08 NOTE — Assessment & Plan Note (Signed)
 Encourage heart healthy diet such as MIND or DASH diet, increase exercise, avoid trans fats, simple carbohydrates and processed foods, consider a krill or fish or flaxseed oil cap daily. Tolerating Atorvastatin

## 2024-06-08 NOTE — Assessment & Plan Note (Signed)
 Well controlled, no changes to meds. Encouraged heart healthy diet such as the DASH diet and exercise as tolerated.

## 2024-06-08 NOTE — Assessment & Plan Note (Signed)
 Encouraged DASH or MIND diet, decrease po intake and increase exercise as tolerated. Needs 7-8 hours of sleep nightly. Avoid trans fats, eat small, frequent meals every 4-5 hours with lean proteins, complex carbs and healthy fats. Minimize simple carbs, high fat foods and processed foods

## 2024-06-08 NOTE — Assessment & Plan Note (Signed)
 Hydrate and monitor

## 2024-06-09 ENCOUNTER — Ambulatory Visit: Admitting: Family Medicine

## 2024-06-09 VITALS — BP 110/70 | HR 70 | Resp 16 | Ht 69.0 in | Wt 277.0 lb

## 2024-06-09 DIAGNOSIS — I10 Essential (primary) hypertension: Secondary | ICD-10-CM

## 2024-06-09 DIAGNOSIS — R252 Cramp and spasm: Secondary | ICD-10-CM | POA: Diagnosis not present

## 2024-06-09 DIAGNOSIS — E782 Mixed hyperlipidemia: Secondary | ICD-10-CM

## 2024-06-09 DIAGNOSIS — E669 Obesity, unspecified: Secondary | ICD-10-CM

## 2024-06-09 DIAGNOSIS — E1169 Type 2 diabetes mellitus with other specified complication: Secondary | ICD-10-CM

## 2024-06-09 DIAGNOSIS — Z6841 Body Mass Index (BMI) 40.0 and over, adult: Secondary | ICD-10-CM

## 2024-06-09 DIAGNOSIS — E559 Vitamin D deficiency, unspecified: Secondary | ICD-10-CM

## 2024-06-09 MED ORDER — LISINOPRIL 10 MG PO TABS: 10.0000 mg | ORAL_TABLET | Freq: Every day | ORAL | Status: DC

## 2024-06-09 NOTE — Patient Instructions (Signed)

## 2024-06-09 NOTE — Progress Notes (Signed)
 Subjective:    Patient ID: April Warren, female    DOB: [birth year], 53 y.o.   MRN: [id number]  Chief Complaint  Patient presents with   Medical Management of Chronic Issues    Patient presents today for a 4 month follow-up.   Quality Metric Gaps    Eye & foot exam, zoster    HPI  Patient is a 53 year old female in today for follow up on chronic medical concerns. No recent febrile illness or hospitalizations. Denies CP/palp/SOB/HA/congestion/fevers/GI or GU c/o. Taking meds as prescribed. She does note ongoing fatigue and increasing chronic non specific pain ie stiff back, sore knees etc just from chasing around her 5 young children. She has not been checking her sugar but does not c/o polyuria or polydipsia.  History of Present Illness     Past Medical History:  Diagnosis Date   Allergy    Asthma    Certain time of the year   Chicken pox 53 yrs old   Diabetes mellitus 08/12/2012   type 2- dr Augustus Ledger diagnosed   Diabetic retinopathy (HCC)    Dysmenorrhea 08/20/2012   HTN (hypertension) 08/20/2012   Hyperlipidemia, mixed 08/28/2013   Neuromuscular disorder (HCC)    Not sure of date   Neuropathy 02/16/2017   Obesity    Obesity, unspecified 08/28/2013   Other and unspecified hyperlipidemia 08/28/2013   Preventative health care 08/20/2012   Sinusitis 11/14/2013   Sinusitis, acute 05/02/2016    Past Surgical History:  Procedure Laterality Date   BREAST BIOPSY Right 01/29/2024   US  RT BREAST BX W LOC DEV 1ST LESION IMG BX SPEC US  GUIDE 01/29/2024 [facility]   EYE SURGERY     Laser surgery for bleeds   REFRACTIVE SURGERY  08/12/2012   right, left on 09/16/12    Family History  Problem Relation Age of Onset   Cancer Mother 59       breast   Diabetes Mother        type 2   Hypertension Mother    Hypertension Father    Hyperlipidemia Father    COPD Father    Arthritis Father    Asthma Father    Hypertension Maternal Grandmother    Hyperlipidemia Maternal  Grandmother    COPD Maternal Grandmother    Asthma Maternal Grandmother    Hypertension Maternal Grandfather    Heart attack Maternal Grandfather    Stroke Maternal Grandfather    Gout Brother    Cancer Brother        lung   Heart disease Paternal Grandfather    Alcohol abuse Paternal Grandfather     Social History   Socioeconomic History   Marital status: Married    Spouse name: Not on file   Number of children: Not on file   Years of education: Not on file   Highest education level: Associate degree: academic program  Occupational History   Not on file  Tobacco Use   Smoking status: Never   Smokeless tobacco: Never  Substance and Sexual Activity   Alcohol use: Yes    Comment: One or two a year   Drug use: No   Sexual activity: Yes    Partners: Male    Birth control/protection: None  Other Topics Concern   Not on file  Social History Narrative   Lives with husband.    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    Not exercising   Social Drivers of Health   Financial Resource Strain: Medium Risk (06/09/2024)   Overall Financial Resource Strain (CARDIA)    Difficulty of Paying Living Expenses: Somewhat hard  Food Insecurity: No Food Insecurity (06/09/2024)   Hunger Vital Sign    Worried About Running Out of Food in the Last Year: Never true    Ran Out of Food in the Last Year: Never true  Transportation Needs: No Transportation Needs (06/09/2024)   PRAPARE - Administrator, Civil Service (Medical): No    Lack of Transportation (Non-Medical): No  Physical Activity: Inactive (06/09/2024)   Exercise Vital Sign    Days of Exercise per Week: 0 days    Minutes of Exercise per Session: Not on file  Stress: Stress Concern Present (06/09/2024)   [employer] of Occupational Health - Occupational Stress Questionnaire    Feeling of Stress: To some extent  Social  Connections: Socially Integrated (06/09/2024)   Social Connection and Isolation Panel    Frequency of Communication with Friends and Family: More than three times a week    Frequency of Social Gatherings with Friends and Family: Once a week    Attends Religious Services: More than 4 times per year    Active Member of [employer] or Organizations: Yes    Attends Engineer, structural: More than 4 times per year    Marital Status: Married  Catering manager Violence: Not on file    Outpatient Medications Prior to Visit  Medication Sig Dispense Refill   albuterol  (PROAIR  HFA) 108 (90 Base) MCG/ACT inhaler USE 2 INHALATIONS EVERY 6 HOURS AS NEEDED FOR WHEEZING OR SHORTNESS OF BREATH 18 g 5   atorvastatin  (LIPITOR) 80 MG tablet TAKE 1 TABLET DAILY 90 tablet 3   Blood Glucose Monitoring Suppl (FREESTYLE FREEDOM LITE) w/Device KIT Check blood sugar twice daily 1 each 0   cetirizine  (ZYRTEC ) 10 MG tablet TAKE 1 TABLET DAILY AS NEEDED FOR ALLERGIES 90 tablet 3   ezetimibe  (ZETIA ) 10 MG tablet TAKE 1 TABLET DAILY 90 tablet 3   fenofibrate  micronized (LOFIBRA) 134 MG capsule TAKE 1 CAPSULE DAILY BEFORE BREAKFAST 90 capsule 3   gabapentin  (NEURONTIN ) 300 MG capsule TAKE 1 CAPSULE TWICE A DAY AND 2 CAPSULES AT BEDTIME 360 capsule 3   glipiZIDE  (GLUCOTROL ) 10 MG tablet Take 0.5 tablets (5 mg total) by mouth 2 (two) times daily before a meal. 180 tablet 1   glucose blood test strip Use as instructed 100 each 12   ibuprofen (ADVIL,MOTRIN) 200 MG tablet Take 400 mg by mouth every 8 (eight) hours as needed.     JANUVIA  100 MG tablet TAKE 1 TABLET DAILY 90 tablet 3   Lancets (FREESTYLE) lancets Check blood sugar twice daily 200 each 5   LORazepam  (ATIVAN ) 1 MG tablet TAKE 1/2 TABLET BY MOUTH TWICE DAILY AS NEEDED FOR ANXIETY 90 tablet 1   metoprolol  tartrate (LOPRESSOR ) 50 MG tablet TAKE 1 TABLET TWICE A DAY 180 tablet 3   montelukast  (SINGULAIR ) 10 MG tablet TAKE 1 TABLET AT BEDTIME 90 tablet 3    Semaglutide , 1 MG/DOSE, 4 MG/3ML SOPN Inject 1 mg into the skin once a week. 9 mL 0   triamterene -hydrochlorothiazide  (MAXZIDE-25) 37.5-25 MG tablet TAKE 1 TABLET DAILY 90 tablet 3   Vitamin D , Ergocalciferol , (DRISDOL ) 1.25 MG (50000 UNIT) CAPS capsule TAKE 1 CAPSULE EVERY 7 DAYS 12 capsule 3   lisinopril  (ZESTRIL ) 10 MG tablet Take  1 tablet (10 mg total) by mouth 2 (two) times daily. 180 tablet 3   Magnesium Oxide 400 MG CAPS Take 1 capsule by mouth daily. Pt is taking 825mg  capsule every other day. (Patient not taking: Reported on 06/09/2024)     doxycycline  (VIBRA -TABS) 100 MG tablet Take 1 tablet (100 mg total) by mouth 2 (two) times daily. 20 tablet 0   fluconazole  (DIFLUCAN ) 150 MG tablet Take 1 tablet (150 mg total) by mouth once a week. (Patient not taking: Reported on 03/25/2024) 2 tablet 0   HYDROcodone  bit-homatropine (HYCODAN) 5-1.5 MG/5ML syrup Take 5 mLs by mouth every 8 (eight) hours as needed for cough. 150 mL 0   lisinopril  (ZESTRIL ) 20 MG tablet TAKE 1 TABLET TWICE A DAY (Patient not taking: Reported on 03/25/2024) 180 tablet 3   metFORMIN  (GLUCOPHAGE -XR) 750 MG 24 hr tablet TAKE 1 TABLET THREE TIMES A DAY 270 tablet 3   No facility-administered medications prior to visit.    No Known Allergies  Review of Systems  Constitutional:  Negative for fever and malaise/fatigue.  HENT:  Negative for congestion.   Eyes:  Negative for blurred vision.  Respiratory:  Negative for shortness of breath.   Cardiovascular:  Negative for chest pain, palpitations and leg swelling.  Gastrointestinal:  Negative for abdominal pain, blood in stool and nausea.  Genitourinary:  Negative for dysuria and frequency.  Musculoskeletal:  Negative for falls.  Skin:  Negative for rash.  Neurological:  Negative for dizziness, loss of consciousness and headaches.  Endo/Heme/Allergies:  Negative for environmental allergies.  Psychiatric/Behavioral:  Negative for depression. The patient is not nervous/anxious.         Objective:    Physical Exam Constitutional:      General: She is not in acute distress.    Appearance: Normal appearance. She is well-developed. She is not toxic-appearing.  HENT:     Head: Normocephalic and atraumatic.     Right Ear: External ear normal.     Left Ear: External ear normal.     Nose: Nose normal.   Eyes:     General:        Right eye: No discharge.        Left eye: No discharge.     Conjunctiva/sclera: Conjunctivae normal.   Neck:     Thyroid : No thyromegaly.   Cardiovascular:     Rate and Rhythm: Normal rate and regular rhythm.     Heart sounds: Normal heart sounds. No murmur heard. Pulmonary:     Effort: Pulmonary effort is normal. No respiratory distress.     Breath sounds: Normal breath sounds.  Abdominal:     General: Bowel sounds are normal.     Palpations: Abdomen is soft.     Tenderness: There is no abdominal tenderness. There is no guarding.   Musculoskeletal:        General: Normal range of motion.     Cervical back: Neck supple.  Lymphadenopathy:     Cervical: No cervical adenopathy.   Skin:    General: Skin is warm and dry.   Neurological:     Mental Status: She is alert and oriented to person, place, and time.   Psychiatric:        Mood and Affect: Mood normal.        Behavior: Behavior normal.        Thought Content: Thought content normal.        Judgment: Judgment normal.     BP 110/70  Pulse 70   Resp 16   Ht 5' 9 (1.753 m)   Wt 277 lb (125.6 kg)   LMP 10/28/2014   SpO2 97%   BMI 40.91 kg/m  Wt Readings from Last 3 Encounters:  06/09/24 277 lb (125.6 kg)  03/25/24 274 lb (124.3 kg)  01/18/24 275 lb 3.2 oz (124.8 kg)    Diabetic Foot Exam - Simple   No data filed    Lab Results  Component Value Date   WBC 7.9 01/18/2024   HGB 12.9 01/18/2024   HCT 38.7 01/18/2024   PLT 290.0 01/18/2024   GLUCOSE 156 (H) 01/18/2024   CHOL 83 01/18/2024   TRIG 193.0 (H) 01/18/2024   HDL 18.50 (L) 01/18/2024    LDLDIRECT 52.0 01/22/2023   LDLCALC 26 01/18/2024   ALT 30 01/18/2024   AST 33 01/18/2024   NA 137 01/18/2024   K 4.5 01/18/2024   CL 104 01/18/2024   CREATININE 1.51 (H) 01/18/2024   BUN 27 (H) 01/18/2024   CO2 26 01/18/2024   TSH 1.89 01/18/2024   INR 1.03 03/04/2011   HGBA1C 8.9 (H) 01/18/2024   MICROALBUR 2.9 (H) 01/18/2024    Lab Results  Component Value Date   TSH 1.89 01/18/2024   Lab Results  Component Value Date   WBC 7.9 01/18/2024   HGB 12.9 01/18/2024   HCT 38.7 01/18/2024   MCV 93.0 01/18/2024   PLT 290.0 01/18/2024   Lab Results  Component Value Date   NA 137 01/18/2024   K 4.5 01/18/2024   CO2 26 01/18/2024   GLUCOSE 156 (H) 01/18/2024   BUN 27 (H) 01/18/2024   CREATININE 1.51 (H) 01/18/2024   BILITOT 0.6 01/18/2024   ALKPHOS 63 01/18/2024   AST 33 01/18/2024   ALT 30 01/18/2024   PROT 6.8 01/18/2024   ALBUMIN 4.1 01/18/2024   CALCIUM  8.9 01/18/2024   ANIONGAP 5 02/16/2023   GFR 39.23 (L) 01/18/2024   Lab Results  Component Value Date   CHOL 83 01/18/2024   Lab Results  Component Value Date   HDL 18.50 (L) 01/18/2024   Lab Results  Component Value Date   LDLCALC 26 01/18/2024   Lab Results  Component Value Date   TRIG 193.0 (H) 01/18/2024   Lab Results  Component Value Date   CHOLHDL 4 01/18/2024   Lab Results  Component Value Date   HGBA1C 8.9 (H) 01/18/2024       Assessment & Plan:  Primary hypertension Assessment & Plan: Well controlled, no changes to meds. Encouraged heart healthy diet such as the DASH diet and exercise as tolerated.   Orders: -     CBC with Differential/Platelet -     Comprehensive metabolic panel with GFR  Hyperlipidemia, mixed Assessment & Plan: Encourage heart healthy diet such as MIND or DASH diet, increase exercise, avoid trans fats, simple carbohydrates and processed foods, consider a krill or fish or flaxseed oil cap daily. Tolerating Atorvastatin   Orders: -     Lipid panel  Muscle  cramps Assessment & Plan: Hydrate and monitor   Obesity, unspecified class, unspecified obesity type, unspecified whether serious comorbidity present Assessment & Plan: Encouraged DASH or MIND diet, decrease po intake and increase exercise as tolerated. Needs 7-8 hours of sleep nightly. Avoid trans fats, eat small, frequent meals every 4-5 hours with lean proteins, complex carbs and healthy fats. Minimize simple carbs, high fat foods and processed foods.   Type 2 diabetes mellitus with obesity (HCC) Assessment &  Plan: hgba1c acceptable, minimize simple carbs. Increase exercise as tolerated. Continue current meds  Orders: -     Hemoglobin A1c -     CBC with Differential/Platelet -     Comprehensive metabolic panel with GFR  Vitamin D  deficiency Assessment & Plan: Supplement and monitor    Other orders -     Lisinopril ; Take 1 tablet (10 mg total) by mouth daily.    Assessment and Plan Assessment & Plan      Randie Bustle, MD

## 2024-06-10 ENCOUNTER — Ambulatory Visit: Payer: Self-pay | Admitting: Family Medicine

## 2024-06-10 LAB — CBC WITH DIFFERENTIAL/PLATELET
Absolute Lymphocytes: 2592 {cells}/uL (ref 850–3900)
Absolute Monocytes: 511 {cells}/uL (ref 200–950)
Basophils Absolute: 50 {cells}/uL (ref 0–200)
Basophils Relative: 0.7 %
Eosinophils Absolute: 241 {cells}/uL (ref 15–500)
Eosinophils Relative: 3.4 %
HCT: 37.8 % (ref 35.0–45.0)
Hemoglobin: 12 g/dL (ref 11.7–15.5)
MCH: 30.8 pg (ref 27.0–33.0)
MCHC: 31.7 g/dL — ABNORMAL LOW (ref 32.0–36.0)
MCV: 96.9 fL (ref 80.0–100.0)
MPV: 10.6 fL (ref 7.5–12.5)
Monocytes Relative: 7.2 %
Neutro Abs: 3706 {cells}/uL (ref 1500–7800)
Neutrophils Relative %: 52.2 %
Platelets: 255 10*3/uL (ref 140–400)
RBC: 3.9 10*6/uL (ref 3.80–5.10)
RDW: 11.2 % (ref 11.0–15.0)
Total Lymphocyte: 36.5 %
WBC: 7.1 10*3/uL (ref 3.8–10.8)

## 2024-06-10 LAB — COMPREHENSIVE METABOLIC PANEL WITH GFR
AG Ratio: 1.5 (calc) (ref 1.0–2.5)
ALT: 27 U/L (ref 6–29)
AST: 25 U/L (ref 10–35)
Albumin: 4.1 g/dL (ref 3.6–5.1)
Alkaline phosphatase (APISO): 64 U/L (ref 37–153)
BUN/Creatinine Ratio: 21 (calc) (ref 6–22)
BUN: 30 mg/dL — ABNORMAL HIGH (ref 7–25)
CO2: 26 mmol/L (ref 20–32)
Calcium: 9.3 mg/dL (ref 8.6–10.4)
Chloride: 104 mmol/L (ref 98–110)
Creat: 1.42 mg/dL — ABNORMAL HIGH (ref 0.50–1.03)
Globulin: 2.8 g/dL (ref 1.9–3.7)
Glucose, Bld: 196 mg/dL — ABNORMAL HIGH (ref 65–99)
Potassium: 4.9 mmol/L (ref 3.5–5.3)
Sodium: 137 mmol/L (ref 135–146)
Total Bilirubin: 0.6 mg/dL (ref 0.2–1.2)
Total Protein: 6.9 g/dL (ref 6.1–8.1)
eGFR: 44 mL/min/{1.73_m2} — ABNORMAL LOW (ref >=60)

## 2024-06-10 LAB — LIPID PANEL
Cholesterol: 87 mg/dL (ref <200)
HDL: 21 mg/dL — ABNORMAL LOW (ref >=50)
LDL Cholesterol (Calc): 37 mg/dL
Non-HDL Cholesterol (Calc): 66 mg/dL (ref <130)
Total CHOL/HDL Ratio: 4.1 (calc) (ref <5.0)
Triglycerides: 220 mg/dL — ABNORMAL HIGH (ref <150)

## 2024-06-10 LAB — HEMOGLOBIN A1C
Hgb A1c MFr Bld: 9.4 % — ABNORMAL HIGH (ref <5.7)
Mean Plasma Glucose: 223 mg/dL
eAG (mmol/L): 12.4 mmol/L

## 2024-06-13 ENCOUNTER — Telehealth: Payer: Self-pay

## 2024-06-13 ENCOUNTER — Other Ambulatory Visit: Payer: Self-pay | Admitting: Family Medicine

## 2024-06-13 MED ORDER — METFORMIN HCL ER 750 MG PO TB24: 750.0000 mg | ORAL_TABLET | Freq: Every day | ORAL | Status: AC

## 2024-06-13 MED ORDER — TIRZEPATIDE 2.5 MG/0.5ML ~~LOC~~ SOAJ
2.5000 mg | SUBCUTANEOUS | 1 refills | Status: DC
  Filled 2024-06-13: qty 2, 28d supply, fill #0

## 2024-06-13 NOTE — Telephone Encounter (Signed)
 Please advise- I see Ozempic  on her med list.

## 2024-06-13 NOTE — Telephone Encounter (Signed)
 Copied from CRM [id number]. Topic: Clinical - Medication Question >> Jun 13, 2024  1:36 PM [name] wrote: Reason for CRM: Patient is call to advise that her pharmacy has not received the prescription for mounjaro .

## 2024-06-14 ENCOUNTER — Other Ambulatory Visit (HOSPITAL_BASED_OUTPATIENT_CLINIC_OR_DEPARTMENT_OTHER): Payer: Self-pay

## 2024-06-14 ENCOUNTER — Encounter (HOSPITAL_BASED_OUTPATIENT_CLINIC_OR_DEPARTMENT_OTHER): Payer: Self-pay

## 2024-06-14 NOTE — Telephone Encounter (Signed)
 Patient wanted to know if she could stay on Ozempic  since she has been on it for a while instead of switching to the mounjaro . Patient states she is completely out of the Ozempic . Also patient want to know if she suppose to take Glipizide  10 once or twice daily? She reports at last visit with Dr. Rodrick Clapper the directions was to take Glipizide  10 mg daily. Please advise.

## 2024-06-16 NOTE — Telephone Encounter (Signed)
 Patient was advised and states she is taking Ozempic  1 mg. But states if the mounjaro  will works best that will be fine. Patient want to know if she suppose to take Glipizide  10 once or twice daily? Please advise.

## 2024-06-17 ENCOUNTER — Other Ambulatory Visit: Payer: Self-pay | Admitting: Family Medicine

## 2024-06-19 ENCOUNTER — Telehealth: Admitting: Physician Assistant

## 2024-06-19 DIAGNOSIS — K047 Periapical abscess without sinus: Secondary | ICD-10-CM | POA: Diagnosis not present

## 2024-06-19 MED ORDER — AMOXICILLIN-POT CLAVULANATE 875-125 MG PO TABS: 1.0000 | ORAL_TABLET | Freq: Two times a day (BID) | ORAL | 0 refills | Status: DC

## 2024-06-19 NOTE — Patient Instructions (Signed)
 April Warren, thank you for joining [name], PA-C for today's virtual visit.  While this provider is not your primary care provider (PCP), if your PCP is located in our provider database this encounter information will be shared with them immediately following your visit.   A [facility] MyChart account gives you access to today's visit and all your visits, tests, and labs performed at [facility]  click here if you don't have a [facility] MyChart account or go to [email or web address]  Consent: (Patient) April Warren provided verbal consent for this virtual visit at the beginning of the encounter.  Current Medications:  Current Outpatient Medications:    amoxicillin -clavulanate (AUGMENTIN ) 875-125 MG tablet, Take 1 tablet by mouth 2 (two) times daily., Disp: 14 tablet, Rfl: 0   albuterol  (PROAIR  HFA) 108 (90 Base) MCG/ACT inhaler, USE 2 INHALATIONS EVERY 6 HOURS AS NEEDED FOR WHEEZING OR SHORTNESS OF BREATH, Disp: 18 g, Rfl: 5   atorvastatin  (LIPITOR) 80 MG tablet, TAKE 1 TABLET DAILY, Disp: 90 tablet, Rfl: 3   Blood Glucose Monitoring Suppl (FREESTYLE FREEDOM LITE) w/Device KIT, Check blood sugar twice daily, Disp: 1 each, Rfl: 0   cetirizine  (ZYRTEC ) 10 MG tablet, TAKE 1 TABLET DAILY AS NEEDED FOR ALLERGIES, Disp: 90 tablet, Rfl: 3   ezetimibe  (ZETIA ) 10 MG tablet, TAKE 1 TABLET DAILY, Disp: 90 tablet, Rfl: 3   fenofibrate  micronized (LOFIBRA) 134 MG capsule, TAKE 1 CAPSULE DAILY BEFORE BREAKFAST, Disp: 90 capsule, Rfl: 3   gabapentin  (NEURONTIN ) 300 MG capsule, TAKE 1 CAPSULE TWICE A DAY AND 2 CAPSULES AT BEDTIME, Disp: 360 capsule, Rfl: 3   glipiZIDE  (GLUCOTROL ) 10 MG tablet, Take 0.5 tablets (5 mg total) by mouth 2 (two) times daily before a meal., Disp: 180 tablet, Rfl: 1   glucose blood test strip, Use as instructed, Disp: 100 each, Rfl: 12   ibuprofen (ADVIL,MOTRIN) 200 MG tablet, Take 400 mg by mouth every 8 (eight) hours as needed., Disp: , Rfl:     JANUVIA  100 MG tablet, TAKE 1 TABLET DAILY, Disp: 90 tablet, Rfl: 3   Lancets (FREESTYLE) lancets, Check blood sugar twice daily, Disp: 200 each, Rfl: 5   lisinopril  (ZESTRIL ) 10 MG tablet, Take 1 tablet (10 mg total) by mouth daily., Disp: , Rfl:    LORazepam  (ATIVAN ) 1 MG tablet, TAKE 1/2 TABLET BY MOUTH TWICE DAILY AS NEEDED FOR ANXIETY, Disp: 90 tablet, Rfl: 1   Magnesium Oxide 400 MG CAPS, Take 1 capsule by mouth daily. Pt is taking 825mg  capsule every other day. (Patient not taking: Reported on 06/09/2024), Disp: , Rfl:    metFORMIN  (GLUCOPHAGE -XR) 750 MG 24 hr tablet, Take 1 tablet (750 mg total) by mouth daily with breakfast., Disp: , Rfl:    metoprolol  tartrate (LOPRESSOR ) 50 MG tablet, TAKE 1 TABLET TWICE A DAY, Disp: 180 tablet, Rfl: 3   montelukast  (SINGULAIR ) 10 MG tablet, TAKE 1 TABLET AT BEDTIME, Disp: 90 tablet, Rfl: 3   triamterene -hydrochlorothiazide  (MAXZIDE-25) 37.5-25 MG tablet, TAKE 1 TABLET DAILY, Disp: 90 tablet, Rfl: 3   Vitamin D , Ergocalciferol , (DRISDOL ) 1.25 MG (50000 UNIT) CAPS capsule, TAKE 1 CAPSULE EVERY 7 DAYS, Disp: 12 capsule, Rfl: 3   Medications ordered in this encounter:  Meds ordered this encounter  Medications   amoxicillin -clavulanate (AUGMENTIN ) 875-125 MG tablet    Sig: Take 1 tablet by mouth 2 (two) times daily.    Dispense:  14 tablet    Refill:  0    Supervising Provider:   BLAISE, PHILIP  MALVA [[id number]]     *If you need refills on other medications prior to your next appointment, please contact your pharmacy*  Follow-Up: Call back or seek an in-person evaluation if the symptoms worsen or if the condition fails to improve as anticipated.  [facility] [phone]  Other Instructions  Based on what you have shared with me in the questionnaire, it sounds like you have a dental abscess. I have prescribed Augmentin  875-125mg  twice a day for 7 days. Please continue your OTC and prescription pain medications.   It is imperative that  you see a dentist within 10 days of this eVisit to determine the cause of the dental pain and be sure it is adequately treated -- Please follow-up tomorrow as scheduled!  A toothache or tooth pain is caused when the nerve in the root of a tooth or surrounding a tooth is irritated. Dental (tooth) infection, decay, injury, or loss of a tooth are the most common causes of dental pain. Pain may also occur after an extraction (tooth is pulled out). Pain sometimes originates from other areas and radiates to the jaw, thus appearing to be tooth pain.Bacteria growing inside your mouth can contribute to gum disease and dental decay, both of which can cause pain. A toothache occurs from inflammation of the central portion of the tooth called pulp. The pulp contains nerve endings that are very sensitive to pain. Inflammation to the pulp or pulpitis may be caused by dental cavities, trauma, and infection.    HOME CARE:   For toothaches: Over-the-counter pain medications such as acetaminophen or ibuprofen may be used. Take these as directed on the package while you arrange for a dental appointment. Avoid very cold or hot foods, because they may make the pain worse. You may get relief from biting on a cotton ball soaked in oil of cloves. You can get oil of cloves at most drug stores.  For jaw pain:  Aspirin may be helpful for problems in the joint of the jaw in adults. If pain happens every time you open your mouth widely, the temporomandibular joint (TMJ) may be the source of the pain. Yawning or taking a large bite of food may worsen the pain. An appointment with your doctor or dentist will help you find the cause.     GET HELP RIGHT AWAY IF:  You have a high fever or chills If you have had a recent head or face injury and develop headache, light headedness, nausea, vomiting, or other symptoms that concern you after an injury to your face or mouth, you could have a more serious injury in addition to your  dental injury. A facial rash associated with a toothache: This condition may improve with medication. Contact your doctor for them to decide what is appropriate. Any jaw pain occurring with chest pain: Although jaw pain is most commonly caused by dental disease, it is sometimes referred pain from other areas. People with heart disease, especially people who have had stents placed, people with diabetes, or those who have had heart surgery may have jaw pain as a symptom of heart attack or angina. If your jaw or tooth pain is associated with lightheadedness, sweating, or shortness of breath, you should see a doctor as soon as possible. Trouble swallowing or excessive pain or bleeding from gums: If you have a history of a weakened immune system, diabetes, or steroid use, you may be more susceptible to infections. Infections can often be more severe and extensive  or caused by unusual organisms. Dental and gum infections in people with these conditions may require more aggressive treatment. An abscess may need draining or IV antibiotics, for example.  MAKE SURE YOU   Understand these instructions. Will watch your condition. Will get help right away if you are not doing well or get worse.    If you have been instructed to have an in-person evaluation today at a local Urgent Care facility, please use the link below. It will take you to a list of all of our available [facility], including address, phone number and hours of operation. Please do not delay care.  [facility]  If you or a family member do not have a primary care provider, use the link below to schedule a visit and establish care. When you choose a [facility] primary care physician or advanced practice provider, you gain a long-term partner in health. Find a Primary Care Provider  Learn more about [facility]'s in-office and virtual care options:  - Get Care Now

## 2024-06-19 NOTE — Progress Notes (Signed)
 Virtual Visit Consent   April Warren, you are scheduled for a virtual visit with a [facility] provider today. Just as with appointments in the office, your consent must be obtained to participate. Your consent will be active for this visit and any virtual visit you may have with one of our providers in the next 365 days. If you have a MyChart account, a copy of this consent can be sent to you electronically.  As this is a virtual visit, video technology does not allow for your provider to perform a traditional examination. This may limit your provider's ability to fully assess your condition. If your provider identifies any concerns that need to be evaluated in person or the need to arrange testing (such as labs, EKG, etc.), we will make arrangements to do so. Although advances in technology are sophisticated, we cannot ensure that it will always work on either your end or our end. If the connection with a video visit is poor, the visit may have to be switched to a telephone visit. With either a video or telephone visit, we are not always able to ensure that we have a secure connection.  By engaging in this virtual visit, you consent to the provision of healthcare and authorize for your insurance to be billed (if applicable) for the services provided during this visit. Depending on your insurance coverage, you may receive a charge related to this service.  I need to obtain your verbal consent now. Are you willing to proceed with your visit today? April Warren has provided verbal consent on 06/19/2024 for a virtual visit (video or telephone). April Warren, [place]  Date: 06/19/2024 10:18 AM   Virtual Visit via Video Note   I, April Warren, connected with  April Warren  ([id number], May 25, 1971) on 06/19/24 at 10:45 AM EDT by a video-enabled telemedicine application and verified that I am speaking with the correct person using two identifiers.  Location: Patient: Virtual Visit Location Patient:  Home Provider: Virtual Visit Location Provider: Home Office   I discussed the limitations of evaluation and management by telemedicine and the availability of in person appointments. The patient expressed understanding and agreed to proceed.    History of Present Illness: April Warren is a 53 y.o. who identifies as a female who was assigned female at birth, and is being seen today for possible dental infection. Endorses Friday night noting lower front dental pain after flossing. Has continued to progress -- with swelling of her bottom low teeth. Endorses fever (Tmax 102). OTC -- Tylenol. Pain currently milder after OTC medication.   Has dental follow-up; tomorrow.   HPI: HPI  Problems:  Patient Active Problem List   Diagnosis Date Noted   Vitamin D  deficiency 04/30/2023   Right shoulder pain 04/15/2022   Left hip pain 04/15/2022   Contact with and (suspected) exposure to covid-19 01/15/2020   Atrophic vaginitis 01/06/2019   Anxiety 08/19/2018   Muscle cramps 07/13/2017   Neuropathy 02/16/2017   Breast cancer screening 02/03/2016   Cervical cancer screening 11/14/2013   Hyperlipidemia, mixed 08/28/2013   HTN (hypertension) 08/20/2012   Preventative health care 08/20/2012   Dysmenorrhea 08/20/2012   Diabetic retinopathy (HCC)    Obesity    Allergy    Type 2 diabetes mellitus with obesity (HCC) 08/12/2012    Allergies: No Known Allergies Medications:  Current Outpatient Medications:    amoxicillin -clavulanate (AUGMENTIN ) 875-125 MG tablet, Take 1 tablet by mouth 2 (two) times daily., Disp: 14 tablet, Rfl: 0  albuterol  (PROAIR  HFA) 108 (90 Base) MCG/ACT inhaler, USE 2 INHALATIONS EVERY 6 HOURS AS NEEDED FOR WHEEZING OR SHORTNESS OF BREATH, Disp: 18 g, Rfl: 5   atorvastatin  (LIPITOR) 80 MG tablet, TAKE 1 TABLET DAILY, Disp: 90 tablet, Rfl: 3   Blood Glucose Monitoring Suppl (FREESTYLE FREEDOM LITE) w/Device KIT, Check blood sugar twice daily, Disp: 1 each, Rfl: 0   cetirizine   (ZYRTEC ) 10 MG tablet, TAKE 1 TABLET DAILY AS NEEDED FOR ALLERGIES, Disp: 90 tablet, Rfl: 3   ezetimibe  (ZETIA ) 10 MG tablet, TAKE 1 TABLET DAILY, Disp: 90 tablet, Rfl: 3   fenofibrate  micronized (LOFIBRA) 134 MG capsule, TAKE 1 CAPSULE DAILY BEFORE BREAKFAST, Disp: 90 capsule, Rfl: 3   gabapentin  (NEURONTIN ) 300 MG capsule, TAKE 1 CAPSULE TWICE A DAY AND 2 CAPSULES AT BEDTIME, Disp: 360 capsule, Rfl: 3   glipiZIDE  (GLUCOTROL ) 10 MG tablet, Take 0.5 tablets (5 mg total) by mouth 2 (two) times daily before a meal., Disp: 180 tablet, Rfl: 1   glucose blood test strip, Use as instructed, Disp: 100 each, Rfl: 12   ibuprofen (ADVIL,MOTRIN) 200 MG tablet, Take 400 mg by mouth every 8 (eight) hours as needed., Disp: , Rfl:    JANUVIA  100 MG tablet, TAKE 1 TABLET DAILY, Disp: 90 tablet, Rfl: 3   Lancets (FREESTYLE) lancets, Check blood sugar twice daily, Disp: 200 each, Rfl: 5   lisinopril  (ZESTRIL ) 10 MG tablet, Take 1 tablet (10 mg total) by mouth daily., Disp: , Rfl:    LORazepam  (ATIVAN ) 1 MG tablet, TAKE 1/2 TABLET BY MOUTH TWICE DAILY AS NEEDED FOR ANXIETY, Disp: 90 tablet, Rfl: 1   Magnesium Oxide 400 MG CAPS, Take 1 capsule by mouth daily. Pt is taking 825mg  capsule every other day. (Patient not taking: Reported on 06/09/2024), Disp: , Rfl:    metFORMIN  (GLUCOPHAGE -XR) 750 MG 24 hr tablet, Take 1 tablet (750 mg total) by mouth daily with breakfast., Disp: , Rfl:    metoprolol  tartrate (LOPRESSOR ) 50 MG tablet, TAKE 1 TABLET TWICE A DAY, Disp: 180 tablet, Rfl: 3   montelukast  (SINGULAIR ) 10 MG tablet, TAKE 1 TABLET AT BEDTIME, Disp: 90 tablet, Rfl: 3   triamterene -hydrochlorothiazide  (MAXZIDE-25) 37.5-25 MG tablet, TAKE 1 TABLET DAILY, Disp: 90 tablet, Rfl: 3   Vitamin D , Ergocalciferol , (DRISDOL ) 1.25 MG (50000 UNIT) CAPS capsule, TAKE 1 CAPSULE EVERY 7 DAYS, Disp: 12 capsule, Rfl: 3  Observations/Objective: Patient is well-developed, well-nourished in no acute distress.  Resting comfortably at  home.  Head is normocephalic, atraumatic.  No labored breathing.  Speech is clear and coherent with logical content.  Patient is alert and oriented at baseline.   Assessment and Plan: 1. Dental infection (Primary) - amoxicillin -clavulanate (AUGMENTIN ) 875-125 MG tablet; Take 1 tablet by mouth 2 (two) times daily.  Dispense: 14 tablet; Refill: 0  Supportive measures and OTC medications reviewed. Dental follow-up tomorrow.  Start Augmentin  per orders.  ER precautions reviewed.   Follow Up Instructions: I discussed the assessment and treatment plan with the patient. The patient was provided an opportunity to ask questions and all were answered. The patient agreed with the plan and demonstrated an understanding of the instructions.  A copy of instructions were sent to the patient via MyChart unless otherwise noted below.   The patient was advised to call back or seek an in-person evaluation if the symptoms worsen or if the condition fails to improve as anticipated.    April Velma Lunger, PA-C

## 2024-06-20 ENCOUNTER — Other Ambulatory Visit: Payer: Self-pay | Admitting: Family Medicine

## 2024-06-20 MED ORDER — SEMAGLUTIDE (1 MG/DOSE) 4 MG/3ML ~~LOC~~ SOPN: 1.0000 mg | PEN_INJECTOR | SUBCUTANEOUS | 1 refills | Status: DC

## 2024-06-23 ENCOUNTER — Other Ambulatory Visit: Payer: Self-pay | Admitting: Family Medicine

## 2024-06-23 DIAGNOSIS — T7840XD Allergy, unspecified, subsequent encounter: Secondary | ICD-10-CM

## 2024-07-11 ENCOUNTER — Other Ambulatory Visit: Payer: Self-pay | Admitting: Family

## 2024-07-19 NOTE — Telephone Encounter (Signed)
 Requesting: lorazepam  1mg   Contract: 04/30/23 UDS: 04/30/23 Last Visit: 06/09/24 Next Visit: 09/22/24 Last Refill: 09/11/23 #90 and 1RF  Please Advise

## 2024-07-19 NOTE — Telephone Encounter (Signed)
 Copied from CRM [id number]. Topic: Clinical - Prescription Issue >> Jul 19, 2024  4:18 PM Mia F wrote: Reason for CRM: A refill for LORazepam  (ATIVAN ) 1 MG tablet was put in on 7/14. It looks like it is still in a pending status. Pt is not out yet but need filled before it is out. Please advise.   [facility] - [place], [place] - [address] [place] [address] Phone: [phone] Fax: [phone] Hours: Not open 24 hours  Pt can be contacted via My Chart

## 2024-07-26 ENCOUNTER — Other Ambulatory Visit: Payer: Self-pay | Admitting: Family Medicine

## 2024-07-26 DIAGNOSIS — I10 Essential (primary) hypertension: Secondary | ICD-10-CM

## 2024-09-05 ENCOUNTER — Other Ambulatory Visit: Payer: Self-pay | Admitting: Family Medicine

## 2024-09-18 NOTE — Assessment & Plan Note (Signed)
 hgba1c acceptable, minimize simple carbs. Increase exercise as tolerated. Continue current meds

## 2024-09-18 NOTE — Assessment & Plan Note (Signed)
 Hydrate and monitor

## 2024-09-18 NOTE — Assessment & Plan Note (Signed)
 Encourage heart healthy diet such as MIND or DASH diet, increase exercise, avoid trans fats, simple carbohydrates and processed foods, consider a krill or fish or flaxseed oil cap daily. Tolerating Atorvastatin

## 2024-09-18 NOTE — Assessment & Plan Note (Signed)
 Patient encouraged to maintain heart healthy diet, regular exercise, adequate sleep. Consider daily probiotics. Take medications as prescribed. Given ACP packet. Labs ordered and reviewed. Pap 2025 repeat in 3-5 years Last MGM 2025 repeat annually, agrees to [facility] 02/2024 repeat in 02/2027

## 2024-09-18 NOTE — Assessment & Plan Note (Signed)
 Encouraged DASH or MIND diet, decrease po intake and increase exercise as tolerated. Needs 7-8 hours of sleep nightly. Avoid trans fats, eat small, frequent meals every 4-5 hours with lean proteins, complex carbs and healthy fats. Minimize simple carbs, high fat foods and processed foods

## 2024-09-18 NOTE — Assessment & Plan Note (Signed)
 Supplement and monitor

## 2024-09-18 NOTE — Assessment & Plan Note (Signed)
 Well controlled, no changes to meds. Encouraged heart healthy diet such as the DASH diet and exercise as tolerated.

## 2024-09-22 ENCOUNTER — Ambulatory Visit: Admitting: Family Medicine

## 2024-09-22 ENCOUNTER — Other Ambulatory Visit: Payer: Self-pay | Admitting: Family Medicine

## 2024-09-22 ENCOUNTER — Encounter: Payer: Self-pay | Admitting: Family Medicine

## 2024-09-22 ENCOUNTER — Ambulatory Visit: Payer: Self-pay | Admitting: Family Medicine

## 2024-09-22 VITALS — BP 116/70 | HR 55 | Temp 97.8°F | Resp 16 | Ht 69.0 in | Wt 277.2 lb

## 2024-09-22 DIAGNOSIS — E669 Obesity, unspecified: Secondary | ICD-10-CM

## 2024-09-22 DIAGNOSIS — I10 Essential (primary) hypertension: Secondary | ICD-10-CM

## 2024-09-22 DIAGNOSIS — R928 Other abnormal and inconclusive findings on diagnostic imaging of breast: Secondary | ICD-10-CM

## 2024-09-22 DIAGNOSIS — Z0000 Encounter for general adult medical examination without abnormal findings: Secondary | ICD-10-CM

## 2024-09-22 DIAGNOSIS — E559 Vitamin D deficiency, unspecified: Secondary | ICD-10-CM

## 2024-09-22 DIAGNOSIS — R252 Cramp and spasm: Secondary | ICD-10-CM | POA: Diagnosis not present

## 2024-09-22 DIAGNOSIS — E782 Mixed hyperlipidemia: Secondary | ICD-10-CM

## 2024-09-22 DIAGNOSIS — E1169 Type 2 diabetes mellitus with other specified complication: Secondary | ICD-10-CM

## 2024-09-22 DIAGNOSIS — Z7985 Long-term (current) use of injectable non-insulin antidiabetic drugs: Secondary | ICD-10-CM | POA: Diagnosis not present

## 2024-09-22 DIAGNOSIS — N6011 Diffuse cystic mastopathy of right breast: Secondary | ICD-10-CM

## 2024-09-22 DIAGNOSIS — Z6841 Body Mass Index (BMI) 40.0 and over, adult: Secondary | ICD-10-CM

## 2024-09-22 LAB — CBC WITH DIFFERENTIAL/PLATELET
Basophils Absolute: 0 K/uL (ref 0.0–0.1)
Basophils Relative: 0.5 % (ref 0.0–3.0)
Eosinophils Absolute: 0.2 K/uL (ref 0.0–0.7)
Eosinophils Relative: 2.8 % (ref 0.0–5.0)
HCT: 37.7 % (ref 36.0–46.0)
Hemoglobin: 12.5 g/dL (ref 12.0–15.0)
Lymphocytes Relative: 36 % (ref 12.0–46.0)
Lymphs Abs: 2.4 K/uL (ref 0.7–4.0)
MCHC: 33.2 g/dL (ref 30.0–36.0)
MCV: 93.9 fl (ref 78.0–100.0)
Monocytes Absolute: 0.4 K/uL (ref 0.1–1.0)
Monocytes Relative: 6.5 % (ref 3.0–12.0)
Neutro Abs: 3.7 K/uL (ref 1.4–7.7)
Neutrophils Relative %: 54.2 % (ref 43.0–77.0)
Platelets: 250 K/uL (ref 150.0–400.0)
RBC: 4.02 Mil/uL (ref 3.87–5.11)
RDW: 12.7 % (ref 11.5–15.5)
WBC: 6.7 K/uL (ref 4.0–10.5)

## 2024-09-22 LAB — COMPREHENSIVE METABOLIC PANEL WITH GFR
ALT: 27 U/L (ref 0–35)
AST: 27 U/L (ref 0–37)
Albumin: 4.2 g/dL (ref 3.5–5.2)
Alkaline Phosphatase: 62 U/L (ref 39–117)
BUN: 24 mg/dL — ABNORMAL HIGH (ref 6–23)
CO2: 29 meq/L (ref 19–32)
Calcium: 9.4 mg/dL (ref 8.4–10.5)
Chloride: 102 meq/L (ref 96–112)
Creatinine, Ser: 1.34 mg/dL — ABNORMAL HIGH (ref 0.40–1.20)
GFR: 45.06 mL/min — ABNORMAL LOW (ref >60.00)
Glucose, Bld: 129 mg/dL — ABNORMAL HIGH (ref 70–99)
Potassium: 5 meq/L (ref 3.5–5.1)
Sodium: 139 meq/L (ref 135–145)
Total Bilirubin: 0.7 mg/dL (ref 0.2–1.2)
Total Protein: 6.9 g/dL (ref 6.0–8.3)

## 2024-09-22 LAB — HEMOGLOBIN A1C: Hgb A1c MFr Bld: 8.1 % — ABNORMAL HIGH (ref 4.6–6.5)

## 2024-09-22 LAB — LIPID PANEL
Cholesterol: 83 mg/dL (ref 0–200)
HDL: 19.4 mg/dL — ABNORMAL LOW (ref >39.00)
LDL Cholesterol: 21 mg/dL (ref 0–99)
NonHDL: 63.88
Total CHOL/HDL Ratio: 4
Triglycerides: 214 mg/dL — ABNORMAL HIGH (ref 0.0–149.0)
VLDL: 42.8 mg/dL — ABNORMAL HIGH (ref 0.0–40.0)

## 2024-09-22 LAB — MICROALBUMIN / CREATININE URINE RATIO
Creatinine,U: 97.5 mg/dL
Microalb Creat Ratio: 10.6 mg/g (ref 0.0–30.0)
Microalb, Ur: 1 mg/dL (ref 0.0–1.9)

## 2024-09-22 LAB — VITAMIN D 25 HYDROXY (VIT D DEFICIENCY, FRACTURES): VITD: 70.45 ng/mL (ref 30.00–100.00)

## 2024-09-22 LAB — TSH: TSH: 1.53 u[IU]/mL (ref 0.35–5.50)

## 2024-09-22 MED ORDER — TIRZEPATIDE 7.5 MG/0.5ML ~~LOC~~ SOAJ: 7.5000 mg | SUBCUTANEOUS | 0 refills | Status: DC

## 2024-09-22 MED ORDER — TIZANIDINE HCL 2 MG PO TABS: 1.0000 mg | ORAL_TABLET | Freq: Two times a day (BID) | ORAL | 1 refills | Status: DC | PRN

## 2024-09-22 MED ORDER — TIRZEPATIDE 10 MG/0.5ML ~~LOC~~ SOAJ: 10.0000 mg | SUBCUTANEOUS | 1 refills | Status: DC

## 2024-09-22 MED ORDER — MELOXICAM 7.5 MG PO TABS: 7.5000 mg | ORAL_TABLET | Freq: Every day | ORAL | 1 refills | Status: DC

## 2024-09-22 NOTE — Patient Instructions (Addendum)
 RSV, Respiratory Syncitial Virus Vaccine, Arexvy  Flu and Covid vaccine annuallyPreventive Care 35-54 Years Old, Female Preventive care refers to lifestyle choices and visits with your health care provider that can promote health and wellness. Preventive care visits are also called wellness exams. What can I expect for my preventive care visit? Counseling Your health care provider may ask you questions about your: Medical history, including: Past medical problems. Family medical history. Pregnancy history. Current health, including: Menstrual cycle. Method of birth control. Emotional well-being. Home life and relationship well-being. Sexual activity and sexual health. Lifestyle, including: Alcohol, nicotine or tobacco, and drug use. Access to firearms. Diet, exercise, and sleep habits. Work and work Astronomer. Sunscreen use. Safety issues such as seatbelt and bike helmet use. Physical exam Your health care provider will check your: Height and weight. These may be used to calculate your BMI (body mass index). BMI is a measurement that tells if you are at a healthy weight. Waist circumference. This measures the distance around your waistline. This measurement also tells if you are at a healthy weight and may help predict your risk of certain diseases, such as type 2 diabetes and high blood pressure. Heart rate and blood pressure. Body temperature. Skin for abnormal spots. What immunizations do I need?  Vaccines are usually given at various ages, according to a schedule. Your health care provider will recommend vaccines for you based on your age, medical history, and lifestyle or other factors, such as travel or where you work. What tests do I need? Screening Your health care provider may recommend screening tests for certain conditions. This may include: Lipid and cholesterol levels. Diabetes screening. This is done by checking your blood sugar (glucose) after you have not eaten  for a while (fasting). Pelvic exam and Pap test. Hepatitis B test. Hepatitis C test. HIV (human immunodeficiency virus) test. STI (sexually transmitted infection) testing, if you are at risk. Lung cancer screening. Colorectal cancer screening. Mammogram. Talk with your health care provider about when you should start having regular mammograms. This may depend on whether you have a family history of breast cancer. BRCA-related cancer screening. This may be done if you have a family history of breast, ovarian, tubal, or peritoneal cancers. Bone density scan. This is done to screen for osteoporosis. Talk with your health care provider about your test results, treatment options, and if necessary, the need for more tests. Follow these instructions at home: Eating and drinking  Eat a diet that includes fresh fruits and vegetables, whole grains, lean protein, and low-fat dairy products. Take vitamin and mineral supplements as recommended by your health care provider. Do not drink alcohol if: Your health care provider tells you not to drink. You are pregnant, may be pregnant, or are planning to become pregnant. If you drink alcohol: Limit how much you have to 0-1 drink a day. Know how much alcohol is in your drink. In the [place] one drink equals one 12 oz bottle of beer (355 mL), one 5 oz glass of wine (148 mL), or one 1 oz glass of hard liquor (44 mL). Lifestyle Brush your teeth every morning and night with fluoride toothpaste. Floss one time each day. Exercise for at least 30 minutes 5 or more days each week. Do not use any products that contain nicotine or tobacco. These products include cigarettes, chewing tobacco, and vaping devices, such as e-cigarettes. If you need help quitting, ask your health care provider. Do not use drugs. If you are sexually active, practice  safe sex. Use a condom or other form of protection to prevent STIs. If you do not wish to become pregnant, use a form of birth  control. If you plan to become pregnant, see your health care provider for a prepregnancy visit. Take aspirin only as told by your health care provider. Make sure that you understand how much to take and what form to take. Work with your health care provider to find out whether it is safe and beneficial for you to take aspirin daily. Find healthy ways to manage stress, such as: Meditation, yoga, or listening to music. Journaling. Talking to a trusted person. Spending time with friends and family. Minimize exposure to UV radiation to reduce your risk of skin cancer. Safety Always wear your seat belt while driving or riding in a vehicle. Do not drive: If you have been drinking alcohol. Do not ride with someone who has been drinking. When you are tired or distracted. While texting. If you have been using any mind-altering substances or drugs. Wear a helmet and other protective equipment during sports activities. If you have firearms in your house, make sure you follow all gun safety procedures. Seek help if you have been physically or sexually abused. What's next? Visit your health care provider once a year for an annual wellness visit. Ask your health care provider how often you should have your eyes and teeth checked. Stay up to date on all vaccines. This information is not intended to replace advice given to you by your health care provider. Make sure you discuss any questions you have with your health care provider. Document Revised: 06/12/2021 Document Reviewed: 06/12/2021 Elsevier Patient Education  2024 [employer].

## 2024-09-23 ENCOUNTER — Telehealth: Payer: Self-pay

## 2024-09-23 NOTE — Telephone Encounter (Signed)
 Copied from CRM [id number]. Topic: Medical Record Request - Records Request >> Sep 19, 2024 12:12 PM Harlene ORN wrote: Reason for CRM: Katrina - Release point  Requesting medical records. Sent a request form on 09/15/2024  Phone: [phone]

## 2024-09-23 NOTE — Telephone Encounter (Signed)
 Returned April Warren call and was not able to speak with her due to no reference number.

## 2024-09-23 NOTE — Progress Notes (Signed)
 Complex Care Management Note  Care Guide Note 09/23/2024 Name: April Warren MRN: [id number] DOB: [birth year]  April Warren is a 53 y.o. year old female who sees [name] LABOR, MD for primary care. I reached out to Benny Laine by phone today to offer complex care management services.  April Warren was given information about Complex Care Management services today including:   The Complex Care Management services include support from the care team which includes your Nurse Care Manager, Clinical Social Worker, or Pharmacist.  The Complex Care Management team is here to help remove barriers to the health concerns and goals most important to you. Complex Care Management services are voluntary, and the patient may decline or stop services at any time by request to their care team member.   Complex Care Management Consent Status: Patient agreed to services and verbal consent obtained.   Follow up plan:  Telephone appointment with complex care management team member scheduled for:  10/10/24 at 2:00 p.m.   Encounter Outcome:  Patient Scheduled  Dreama [facility]  [facility] VBCI Assistant Direct Dial: [phone]  Fax: [phone]

## 2024-09-25 ENCOUNTER — Encounter: Payer: Self-pay | Admitting: Family Medicine

## 2024-09-25 NOTE — Progress Notes (Signed)
 Subjective:    Patient ID: April Warren, female    DOB: [birth year], 53 y.o.   MRN: [id number]  Chief Complaint  Patient presents with  . Annual Exam    Patient presents today for a physical exam  . Quality Metric Gaps    Eye & foot exam, Hep B, zoster vaccines    HPI Discussed the use of AI scribe software for clinical note transcription with the patient, who gave verbal consent to proceed.  History of Present Illness April Warren is a 53 year old female with uncontrolled diabetes who presents for diabetes management.  She reports ongoing challenges with managing her diabetes and acknowledges her blood sugar levels have been high. Her current regimen includes semaglutide  at 4 mg weekly and metformin  at 750 mg, though she finds metformin  difficult to tolerate. She faces difficulties obtaining Ozempic  due to insurance issues with [employer]. Dietary adjustments have been made, focusing on increased protein intake and reduced carbohydrates, and she has reduced her consumption of sweetened beverages, opting for water instead.  She experiences fatigue and generalized body pains, which she attributes to aging and the demands of caring for an infant during the day. Her energy levels are low, making it difficult to complete tasks that were once easy.  She reports hip pain that 'catches' and sometimes radiates to her back. She uses topical treatments like Biofreeze and Aspercreme and is considering additional medication for relief.  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  She reports occasional tingling and achiness in her feet. No significant changes in bowel habits, skin, vision, or hearing. She has not had a  recent ophthalmology exam due to insurance changes.    Past Medical History:  Diagnosis Date  . Allergy   . Asthma    Certain time of the year  . Chicken pox 53 yrs old  . Diabetes mellitus 08/12/2012   type 2- dr alvia diagnosed  . Diabetic retinopathy (HCC)   . Dysmenorrhea 08/20/2012  . HTN (hypertension) 08/20/2012  . Hyperlipidemia, mixed 08/28/2013  . Neuromuscular disorder [facility])    Not sure of date  . Neuropathy 02/16/2017  . Obesity   . Obesity, unspecified 08/28/2013  . Other and unspecified hyperlipidemia 08/28/2013  . Preventative health care 08/20/2012  . Sinusitis 11/14/2013  . Sinusitis, acute 05/02/2016    Past Surgical History:  Procedure Laterality Date  . BREAST BIOPSY Right 01/29/2024   US  RT BREAST BX W LOC DEV 1ST LESION IMG BX SPEC US  GUIDE 01/29/2024 [facility] MAMMOGRAPHY  . EYE SURGERY     Laser surgery for bleeds  . REFRACTIVE SURGERY  08/12/2012   right, left on 09/16/12    Family History  Problem Relation Age of Onset  . Cancer Mother 93       breast  . Diabetes Mother        type 2  . Hypertension Mother   . Hypertension Father   . Hyperlipidemia Father   . COPD Father   . Arthritis Father   . Asthma Father   . Hypertension Maternal Grandmother   . Hyperlipidemia Maternal Grandmother   . COPD Maternal Grandmother   . Asthma Maternal Grandmother   . Hypertension Maternal  Grandfather   . Heart attack Maternal Grandfather   . Stroke Maternal Grandfather   . Gout Brother   . Cancer Brother        lung  . Heart disease Paternal Grandfather   . Alcohol abuse Paternal Grandfather     Social History   Socioeconomic History  . Marital status: Married    Spouse name: Not on file  . Number of children: Not on file  . Years of education: Not on file  . Highest education level: Associate degree: academic program  Occupational History  . Not on file  Tobacco Use  . Smoking status: Never  . Smokeless tobacco: Never  Substance and  Sexual Activity  . Alcohol use: Yes    Comment: One or two a year  . Drug use: No  . Sexual activity: Yes    Partners: Male    Birth control/protection: None  Other Topics Concern  . Not on file  Social History Narrative   Lives with husband.    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    Not exercising   Social Drivers of Health   Financial Resource Strain: Medium Risk (06/09/2024)   Overall Financial Resource Strain (CARDIA)   . Difficulty of Paying Living Expenses: Somewhat hard  Food Insecurity: No Food Insecurity (06/09/2024)   Hunger Vital Sign   . Worried About Programme researcher, broadcasting/film/video in the Last Year: Never true   . Ran Out of Food in the Last Year: Never true  Transportation Needs: No Transportation Needs (06/09/2024)   PRAPARE - Transportation   . Lack of Transportation (Medical): No   . Lack of Transportation (Non-Medical): No  Physical Activity: Inactive (06/09/2024)   Exercise Vital Sign   . Days of Exercise per Week: 0 days   . Minutes of Exercise per Session: Not on file  Stress: Stress Concern Present (06/09/2024)   [employer] of Occupational Health - Occupational Stress Questionnaire   . Feeling of Stress: To some extent  Social Connections: Socially Integrated (06/09/2024)   Social Connection and Isolation Panel   . Frequency of Communication with Friends and Family: More than three times a week   . Frequency of Social Gatherings with Friends and Family: Once a week   . Attends Religious Services: More than 4 times per year   . Active Member of Clubs or Organizations: Yes   . Attends Banker Meetings: More than 4 times per year   . Marital Status: Married  Catering manager Violence: Not on file    Outpatient Medications Prior to Visit  Medication Sig Dispense Refill  . albuterol  (PROAIR  HFA) 108 (90 Base) MCG/ACT inhaler USE 2 INHALATIONS EVERY  6 HOURS AS NEEDED FOR WHEEZING OR SHORTNESS OF BREATH 18 g 5  . atorvastatin  (LIPITOR) 80 MG tablet TAKE 1 TABLET DAILY 90 tablet 3  . Blood Glucose Monitoring Suppl (FREESTYLE FREEDOM LITE) w/Device KIT Check blood sugar twice daily 1 each 0  . cetirizine  (ZYRTEC ) 10 MG tablet Take 1 tablet (10 mg total) by mouth daily as needed for allergies or rhinitis. 90 tablet 1  . ezetimibe  (ZETIA ) 10 MG tablet TAKE 1 TABLET DAILY 90 tablet 3  . fenofibrate  micronized (LOFIBRA) 134 MG capsule Take 1 capsule (134 mg total) by mouth daily before breakfast. 90 capsule 1  . gabapentin  (NEURONTIN ) 300 MG capsule TAKE 1 CAPSULE TWICE A  DAY AND 2 CAPSULES AT BEDTIME 360 capsule 3  . glipiZIDE  (GLUCOTROL ) 10 MG tablet Take 0.5 tablets (5 mg total) by mouth 2 (two) times daily before a meal. 180 tablet 1  . glucose blood test strip Use as instructed 100 each 12  . ibuprofen (ADVIL,MOTRIN) 200 MG tablet Take 400 mg by mouth every 8 (eight) hours as needed.    . Lancets (FREESTYLE) lancets Check blood sugar twice daily 200 each 5  . lisinopril  (ZESTRIL ) 10 MG tablet Take 1 tablet (10 mg total) by mouth daily.    . LORazepam  (ATIVAN ) 1 MG tablet TAKE 1/2 TABLET BY MOUTH TWICE DAILY AS NEEDED FOR ANXIETY 90 tablet 1  . Magnesium Oxide 400 MG CAPS Take 1 capsule by mouth daily. Pt is taking 825mg  capsule every other day.    . metFORMIN  (GLUCOPHAGE -XR) 750 MG 24 hr tablet Take 1 tablet (750 mg total) by mouth daily with breakfast.    . metoprolol  tartrate (LOPRESSOR ) 50 MG tablet TAKE 1 TABLET TWICE A DAY 180 tablet 3  . montelukast  (SINGULAIR ) 10 MG tablet TAKE 1 TABLET AT BEDTIME 90 tablet 3  . triamterene -hydrochlorothiazide  (MAXZIDE-25) 37.5-25 MG tablet TAKE 1 TABLET DAILY 90 tablet 3  . Vitamin D , Ergocalciferol , (DRISDOL ) 1.25 MG (50000 UNIT) CAPS capsule TAKE 1 CAPSULE EVERY 7 DAYS 12 capsule 3  . JANUVIA  100 MG tablet TAKE 1 TABLET DAILY 90 tablet 3  . Semaglutide , 1 MG/DOSE, 4 MG/3ML SOPN Inject 1 mg into the  skin once a week. 9 mL 1  . amoxicillin -clavulanate (AUGMENTIN ) 875-125 MG tablet Take 1 tablet by mouth 2 (two) times daily. 14 tablet 0   No facility-administered medications prior to visit.    No Known Allergies  Review of Systems  Constitutional:  Positive for malaise/fatigue. Negative for fever.  HENT:  Negative for congestion.   Eyes:  Negative for blurred vision.  Respiratory:  Negative for shortness of breath.   Cardiovascular:  Negative for chest pain, palpitations and leg swelling.  Gastrointestinal:  Positive for diarrhea. Negative for abdominal pain, blood in stool and nausea.  Genitourinary:  Negative for dysuria and frequency.  Musculoskeletal:  Positive for back pain, joint pain and myalgias. Negative for falls.  Skin:  Negative for rash.  Neurological:  Positive for tingling. Negative for dizziness, loss of consciousness and headaches.  Endo/Heme/Allergies:  Negative for environmental allergies.  Psychiatric/Behavioral:  Negative for depression. The patient is not nervous/anxious.        Objective:    Physical Exam Constitutional:      General: She is not in acute distress.    Appearance: Normal appearance. She is well-developed. She is not toxic-appearing.  HENT:     Head: Normocephalic and atraumatic.     Right Ear: External ear normal.     Left Ear: External ear normal.     Nose: Nose normal.  Eyes:     General:        Right eye: No discharge.        Left eye: No discharge.     Conjunctiva/sclera: Conjunctivae normal.  Neck:     Thyroid : No thyromegaly.  Cardiovascular:     Rate and Rhythm: Normal rate and regular rhythm.     Heart sounds: Normal heart sounds. No murmur heard. Pulmonary:     Effort: Pulmonary effort is normal. No respiratory distress.     Breath sounds: Normal breath sounds.  Abdominal:     General: Bowel sounds are normal.  Palpations: Abdomen is soft.     Tenderness: There is no abdominal tenderness. There is no guarding.   Musculoskeletal:        General: Normal range of motion.     Cervical back: Neck supple.  Lymphadenopathy:     Cervical: No cervical adenopathy.  Skin:    General: Skin is warm and dry.  Neurological:     Mental Status: She is alert and oriented to person, place, and time.  Psychiatric:        Mood and Affect: Mood normal.        Behavior: Behavior normal.        Thought Content: Thought content normal.        Judgment: Judgment normal.     BP 116/70   Pulse (!) 55   Temp 97.8 F (36.6 C)   Resp 16   Ht 5' 9 (1.753 m)   Wt 277 lb 3.2 oz (125.7 kg)   LMP 10/28/2014   SpO2 99%   BMI 40.94 kg/m  Wt Readings from Last 3 Encounters:  09/22/24 277 lb 3.2 oz (125.7 kg)  06/09/24 277 lb (125.6 kg)  03/25/24 274 lb (124.3 kg)    Diabetic Foot Exam - Simple   Simple Foot Form Diabetic Foot exam was performed with the following findings: Yes 09/22/2024 10:08 AM  Visual Inspection No deformities, no ulcerations, no other skin breakdown bilaterally: Yes Sensation Testing Intact to touch and monofilament testing bilaterally: Yes Pulse Check Posterior Tibialis and Dorsalis pulse intact bilaterally: Yes Comments    Lab Results  Component Value Date   WBC 6.7 09/22/2024   HGB 12.5 09/22/2024   HCT 37.7 09/22/2024   PLT 250.0 09/22/2024   GLUCOSE 129 (H) 09/22/2024   CHOL 83 09/22/2024   TRIG 214.0 (H) 09/22/2024   HDL 19.40 (L) 09/22/2024   LDLDIRECT 52.0 01/22/2023   LDLCALC 21 09/22/2024   ALT 27 09/22/2024   AST 27 09/22/2024   NA 139 09/22/2024   K 5.0 09/22/2024   CL 102 09/22/2024   CREATININE 1.34 (H) 09/22/2024   BUN 24 (H) 09/22/2024   CO2 29 09/22/2024   TSH 1.53 09/22/2024   INR 1.03 03/04/2011   HGBA1C 8.1 (H) 09/22/2024   MICROALBUR 1.0 09/22/2024    Lab Results  Component Value Date   TSH 1.53 09/22/2024   Lab Results  Component Value Date   WBC 6.7 09/22/2024   HGB 12.5 09/22/2024   HCT 37.7 09/22/2024   MCV 93.9 09/22/2024   PLT 250.0  09/22/2024   Lab Results  Component Value Date   NA 139 09/22/2024   K 5.0 09/22/2024   CO2 29 09/22/2024   GLUCOSE 129 (H) 09/22/2024   BUN 24 (H) 09/22/2024   CREATININE 1.34 (H) 09/22/2024   BILITOT 0.7 09/22/2024   ALKPHOS 62 09/22/2024   AST 27 09/22/2024   ALT 27 09/22/2024   PROT 6.9 09/22/2024   ALBUMIN 4.2 09/22/2024   CALCIUM  9.4 09/22/2024   ANIONGAP 5 02/16/2023   EGFR 44 (L) 06/09/2024   GFR 45.06 (L) 09/22/2024   Lab Results  Component Value Date   CHOL 83 09/22/2024   Lab Results  Component Value Date   HDL 19.40 (L) 09/22/2024   Lab Results  Component Value Date   LDLCALC 21 09/22/2024   Lab Results  Component Value Date   TRIG 214.0 (H) 09/22/2024   Lab Results  Component Value Date   CHOLHDL 4 09/22/2024   Lab  Results  Component Value Date   HGBA1C 8.1 (H) 09/22/2024       Assessment & Plan:  Primary hypertension Assessment & Plan: Well controlled, no changes to meds. Encouraged heart healthy diet such as the DASH diet and exercise as tolerated.   Orders: -     Comprehensive metabolic panel with GFR -     CBC with Differential/Platelet  Hyperlipidemia, mixed Assessment & Plan: Encourage heart healthy diet such as MIND or DASH diet, increase exercise, avoid trans fats, simple carbohydrates and processed foods, consider a krill or fish or flaxseed oil cap daily. Tolerating Atorvastatin   Orders: -     Lipid panel -     TSH  Muscle cramps Assessment & Plan: Hydrate and monitor   Obesity, unspecified class, unspecified obesity type, unspecified whether serious comorbidity present Assessment & Plan: Encouraged DASH or MIND diet, decrease po intake and increase exercise as tolerated. Needs 7-8 hours of sleep nightly. Avoid trans fats, eat small, frequent meals every 4-5 hours with lean proteins, complex carbs and healthy fats. Minimize simple carbs, high fat foods and processed foods.   Type 2 diabetes mellitus with  obesity Assessment & Plan: hgba1c acceptable, minimize simple carbs. Increase exercise as tolerated. Continue current meds  Orders: -     Hemoglobin A1c -     Microalbumin / creatinine urine ratio -     AMB Referral VBCI Care Management  Vitamin D  deficiency Assessment & Plan: Supplement and monitor   Orders: -     VITAMIN D  25 Hydroxy (Vit-D Deficiency, Fractures)  Preventative health care Assessment & Plan: Patient encouraged to maintain heart healthy diet, regular exercise, adequate sleep. Consider daily probiotics. Take medications as prescribed. Given ACP packet. Labs ordered and reviewed. Pap 2025 repeat in 3-5 years Last MGM 2025 repeat annually, agrees to [facility] 02/2024 repeat in 02/2027  Orders: -     Comprehensive metabolic panel with GFR -     CBC with Differential/Platelet -     Lipid panel -     Hemoglobin A1c -     Microalbumin / creatinine urine ratio -     TSH -     VITAMIN D  25 Hydroxy (Vit-D Deficiency, Fractures)  Other orders -     Meloxicam ; Take 1-2 tablets (7.5-15 mg total) by mouth daily.  Dispense: 40 tablet; Refill: 1 -     tiZANidine  HCl; Take 0.5-2 tablets (1-4 mg total) by mouth 2 (two) times daily between meals as needed for muscle spasms.  Dispense: 40 tablet; Refill: 1 -     Tirzepatide ; Inject 7.5 mg into the skin once a week.  Dispense: 6 mL; Refill: 0 -     Tirzepatide ; Inject 10 mg into the skin once a week.  Dispense: 6 mL; Refill: 1    Assessment and Plan Assessment & Plan Type 2 diabetes mellitus with complications (uncontrolled, with early neuropathy) Diabetes remains uncontrolled with hyperglycemia and early neuropathy. Challenges with current medication regimen noted. Potential switch to Mounjaro  discussed for improved glycemic control. Mounjaro 's dual mechanism as a GLP-1 and GIP inhibitor may offer better control. Emphasized importance of blood sugar management to prevent complications. Potential need for basal insulin if  hyperglycemia persists. Dietary changes and pharmacist Tammy's role in medication management and prior authorization highlighted. - Switch from Ozempic  to Mounjaro , starting with 7.5 mg weekly for one month, then increase to 10 mg. - Refer to pharmacist Tammy for medication management and prior authorization assistance. - Order lab work to  assess current blood sugar levels. - Encourage dietary changes focusing on more protein and less carbohydrates. - Discuss potential need for basal insulin if hyperglycemia persists.  Obesity Weight is well-managed despite challenges with sleep and stress. Importance of weight management in conjunction with diabetes control discussed. - Encourage continued dietary changes and physical activity as tolerated.  Hip and back pain with muscle spasms Intermittent hip and back pain with muscle spasms, likely related to stress and physical activity. Treatment options including muscle relaxants and anti-inflammatory medications discussed. Meloxicam  and tizanidine  can be used as needed, with dosing flexibility based on severity. Topical treatments like Biofreeze discussed. - Prescribe meloxicam  7.5 mg daily for anti-inflammatory effect. - Prescribe tizanidine  2 mg as needed for muscle spasms, with dosing flexibility based on severity. - Encourage use of topical treatments like Biofreeze.  Essential hypertension Blood pressure is well-controlled with current medication regimen.  Mixed hyperlipidemia Discussion of various health topics, including preventative care and management of chronic conditions.  Adult Wellness Visit Routine wellness visit. General health maintenance discussed, including importance of vaccinations to prevent pneumonia and other illnesses. 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 - Encourage vaccinations, including RSV, flu, and COVID-19, to reduce risk of pneumonia. - Discussed importance  of maintaining health to care for family and work responsibilities.  Recording duration: 30 minutes     Harlene Horton, MD

## 2024-10-10 ENCOUNTER — Other Ambulatory Visit

## 2024-10-10 ENCOUNTER — Telehealth: Payer: Self-pay | Admitting: Pharmacist

## 2024-10-10 NOTE — Telephone Encounter (Signed)
 Attempt was made to contact patient by phone today for initial visit with Clinical Pharmacist regarding diabetes and medication access.  Unable to reach patient.Attempted twice today - once at 2:15pm and again at 4:30pm I was not able to Lm on VM because mailbox was full.

## 2024-10-14 ENCOUNTER — Ambulatory Visit
Admission: RE | Admit: 2024-10-14 | Discharge: 2024-10-14 | Disposition: A | Discharge status: Home / Self Care | Source: Ambulatory Visit | Attending: Family Medicine | Admitting: Family Medicine

## 2024-10-14 ENCOUNTER — Telehealth: Payer: Self-pay

## 2024-10-14 DIAGNOSIS — N6011 Diffuse cystic mastopathy of right breast: Secondary | ICD-10-CM

## 2024-10-14 DIAGNOSIS — R928 Other abnormal and inconclusive findings on diagnostic imaging of breast: Secondary | ICD-10-CM

## 2024-10-14 NOTE — Progress Notes (Unsigned)
 Complex Care Management Note Care Guide Note  10/14/2024 Name: April Warren MRN: [id number] DOB: 30-Jun-1971   Complex Care Management Outreach Attempts: An unsuccessful telephone outreach was attempted today to offer the patient information about available complex care management services.  Follow Up Plan:  Additional outreach attempts will be made to offer the patient complex care management information and services.   Encounter Outcome:  No Answer  Dreama [facility]wood [facility]  [facility], [facility] VBCI Assistant Direct Dial: [phone]  Fax: [phone]

## 2024-10-16 ENCOUNTER — Ambulatory Visit: Payer: Self-pay | Admitting: Family Medicine

## 2024-10-17 NOTE — Progress Notes (Signed)
 Complex Care Management Note Care Guide Note  10/17/2024 Name: April Warren MRN: [id number] DOB: 06/25/1971   Complex Care Management Outreach Attempts: A second unsuccessful outreach was attempted today to offer the patient with information about available complex care management services.  Follow Up Plan:  No further outreach attempts will be made at this time. We have been unable to contact the patient to offer or enroll patient in complex care management services.  Encounter Outcome:  No Answer  Dreama [facility]wood [facility]  [facility], [facility] VBCI Assistant Direct Dial: [phone]  Fax: [phone]

## 2024-10-20 ENCOUNTER — Telehealth: Payer: Self-pay

## 2024-10-20 NOTE — Progress Notes (Signed)
 Complex Care Management Care Guide Note  10/20/2024 Name: April Warren MRN: [id number] DOB: [birth year]  April Warren is a 53 y.o. year old female who is a primary care patient of [name] LABOR, MD and is actively engaged with the care management team. I reached out to Benny Laine by phone today to assist with re-scheduling  with the Pharmacist.  Follow up plan: Telephone appointment with complex care management team member scheduled for:  11/02/24 at 11:00 a.m.   Dreama Lynwood [facility]  [facility], [place], [facility] VBCI Assistant Direct Dial: [phone]  Fax: [phone]

## 2024-11-02 ENCOUNTER — Encounter: Payer: Self-pay | Admitting: Pharmacist

## 2024-11-02 ENCOUNTER — Other Ambulatory Visit

## 2024-11-02 NOTE — Progress Notes (Signed)
 11/02/2024 Name: April Warren MRN: [id number] DOB: 08/02/1971  Chief Complaint  Patient presents with   Diabetes   Medication Management    April Warren is a 53 y.o. year old female who presented for a telephone visit.   They were referred to the pharmacist by their PCP for assistance in managing diabetes and medication access.    Subjective:  Care Team: Primary Care Provider: [name] LABOR, MD ; Next Scheduled Visit: 2026  Medication Access/Adherence  Current Pharmacy:  [facility] - [address], [place] - [address] [place] [address] Phone: [phone] Fax: [phone]  [facility] [address] [place] [address] Phone: [phone] Fax: [phone]  [facility] - [place], [place] - [address] [place] [address] [place] [address] Phone: [phone] Fax: [phone]  [id number] - [place], [place] - [address] [place] [address] Phone: [phone] Fax: [phone]   Patient reports affordability concerns with their medications: Yes  Patient reports access/transportation concerns to their pharmacy: No  Patient reports adherence concerns with their medications:  Yes  related to cost.   She has [employer] coverage. She reports finances are tight and $50 / month for Mounjaro  and Ozempic  was more than she could budget. Trlicity cost was lower per patient.    Diabetes:  Current medications: glipizide  10mg  daily (directions are 5mg  twice a day), Januvia  100mg  daily and metformin  ER 750mg  daily .  Patient was prescribed Mounjaro  but she did not start due to cost of $50 / 30 days.  Medications tried in the past: Trulicity  4.5mg  weekly - did not get A1c to goal; Ozempic  - never started due to cost.   Current glucose readings: checking fasting mostly Usually 150's  Patient does has history of  hypoglycemia that required emergency services to come to her home. Occurred 02/17/2024 - blood glucose dropped to 42. Patient was not aware that her blood glucose was low. She was taken to the ER. She has another severe hypoglycemic event about a week later. Blood glucose was again in the 40's.   She has not recently had a low blood glucose   Patient denies hypoglycemic s/sx including no dizziness, shakiness, sweating. Patient denies hyperglycemic symptoms including no polyuria, polydipsia, polyphagia, nocturia, neuropathy, blurred vision.  Current meal patterns:  She reports it is hard for her to not drink sweet tea. She does not drink soda.  She also like sweets and ketchup.   Macrovascular and Microvascular Risk Reduction:  Statin? yes (atorvastatin  80mg ); ACEi/ARB? yes (lisinopril  20mg ) Last urinary albumin/creatinine ratio:  Lab Results  Component Value Date   MICRALBCREAT 10.6 09/22/2024   Last eye exam:  Lab Results  Component Value Date   HMDIABEYEEXA Retinopathy (A) 04/13/2017   Last foot exam: 09/22/2024 Tobacco Use:  Tobacco Use: Low Risk  (09/22/2024)   Patient History    Smoking Tobacco Use: Never    Smokeless Tobacco Use: Never    Passive Exposure: Not on file     Objective:  BP Readings from Last 3 Encounters:  09/22/24 116/70  06/09/24 110/70  03/25/24 (!) 143/66     Lab Results  Component Value Date   HGBA1C 8.1 (H) 09/22/2024    Lab Results  Component Value Date   CREATININE 1.34 (H) 09/22/2024   BUN 24 (H) 09/22/2024   NA 139 09/22/2024  K 5.0 09/22/2024   CL 102 09/22/2024   CO2 29 09/22/2024    Lab Results  Component Value Date   CHOL 83 09/22/2024   HDL 19.40 (L) 09/22/2024   LDLCALC 21 09/22/2024   LDLDIRECT 52.0 01/22/2023   TRIG 214.0 (H) 09/22/2024   CHOLHDL 4 09/22/2024    Medications Reviewed Today     Reviewed by Carla Milling, RPH-CPP (Pharmacist) on 11/02/24 at 1148  Med List Status: <None>   Medication Order  Taking? Sig Documenting Provider Last Dose Status Informant  albuterol  (PROAIR  HFA) 108 (90 Base) MCG/ACT inhaler [id number] Yes USE 2 INHALATIONS EVERY 6 HOURS AS NEEDED FOR WHEEZING OR SHORTNESS OF BREATH  Patient taking differently: 2 puffs every 6 (six) hours as needed for wheezing or shortness of breath.   [name] LABOR, MD  Active   aspirin EC 81 MG tablet [id number] Yes Take 81 mg by mouth daily. Swallow whole. [provider]  Active   atorvastatin  (LIPITOR) 80 MG tablet [id number] Yes TAKE 1 TABLET DAILY [name] LABOR, MD  Active   Blood Glucose Monitoring Suppl (FREESTYLE FREEDOM LITE) w/Device KIT [id number]  Check blood sugar twice daily [name] LABOR, MD  Active   cetirizine  (ZYRTEC ) 10 MG tablet [id number] Yes Take 1 tablet (10 mg total) by mouth daily as needed for allergies or rhinitis. [name] LABOR, MD  Active   ezetimibe  (ZETIA ) 10 MG tablet [id number] Yes TAKE 1 TABLET DAILY [name] LABOR, MD  Active   fenofibrate  micronized (LOFIBRA) 134 MG capsule [id number] Yes Take 1 capsule (134 mg total) by mouth daily before breakfast. [name] LABOR, MD  Active   gabapentin  (NEURONTIN ) 300 MG capsule [id number] Yes TAKE 1 CAPSULE TWICE A DAY AND 2 CAPSULES AT BEDTIME [name] LABOR, MD  Active   glipiZIDE  (GLUCOTROL ) 10 MG tablet [id number] Yes Take 0.5 tablets (5 mg total) by mouth 2 (two) times daily before a meal.  Patient taking differently: Take 10 mg by mouth daily before breakfast.   [name] LABOR, MD  Active   glucose blood test strip [id number]  Use as instructed [name] LABOR, MD  Active   ibuprofen (ADVIL,MOTRIN) 200 MG tablet [id number] Yes Take 400 mg by mouth every 8 (eight) hours as needed. [provider]  Active   Lancets (FREESTYLE) lancets [id number]  Check blood sugar twice daily [name] LABOR, MD  Active     Discontinued 11/02/24 1139   lisinopril  (ZESTRIL ) 20 MG tablet [id number] Yes Take 20 mg by mouth 2 (two) times daily. [provider]  Active   LORazepam  (ATIVAN ) 1 MG tablet [id number] Yes TAKE 1/2 TABLET BY MOUTH TWICE DAILY AS NEEDED FOR ANXIETY [name] LABOR, MD  Active   Magnesium Oxide 400 MG CAPS [id number] Yes Take 1 capsule by mouth daily. Pt is taking 825mg  capsule every other day.  Patient taking differently: Take 1 capsule by mouth daily. Pt is taking 700mg  twice a day   [provider]  Active   meloxicam  (MOBIC ) 7.5 MG tablet [id number]  Take 1-2 tablets (7.5-15 mg total) by mouth daily.  Patient not taking: Reported on 11/02/2024   [name] LABOR, MD  Active   metFORMIN  (GLUCOPHAGE -XR) 750 MG 24 hr tablet [id number] Yes Take 1 tablet (750 mg total) by mouth daily with breakfast. [name] LABOR, MD  Active   metoprolol  tartrate (LOPRESSOR ) 50 MG tablet [id number] Yes TAKE 1 TABLET TWICE A DAY [name] LABOR, MD  Active   Misc Natural Products (BEET ROOT PO) [id number] Yes Take 1 tablet by mouth daily. [provider]  Active   montelukast  (SINGULAIR ) 10 MG tablet [id number] Yes TAKE 1 TABLET AT BEDTIME [name] LABOR, MD  Active   Potassium Bicarbonate 99 MG CAPS [id number] Yes Take 2 capsules by mouth daily. [provider]  Active   sitaGLIPtin  (JANUVIA ) 100 MG tablet [id number] Yes Take 100 mg by mouth daily. [provider]  Active    Patient not taking:   Discontinued 11/02/24 1131 (Cost of medication)    Patient not taking:   Discontinued 11/02/24 1130 (Cost of medication)   tiZANidine  (ZANAFLEX ) 2 MG tablet [id number]  Take 0.5-2 tablets (1-4 mg total) by mouth 2 (two) times daily between meals as needed for muscle spasms.  Patient not taking: Reported on 11/02/2024   [name] LABOR, MD  Active   triamterene -hydrochlorothiazide  (MAXZIDE-25) 37.5-25 MG tablet [id number] Yes TAKE 1 TABLET DAILY [name] LABOR, MD  Active   Vitamin D , Ergocalciferol , (DRISDOL ) 1.25 MG (50000 UNIT) CAPS capsule [id number] Yes TAKE 1 CAPSULE EVERY 7 DAYS Webb, Padonda B, FNP  Active                Assessment/Plan:   Diabetes: - Currently uncontrolled; goal A1c <7%. Cardiorenal risk reduction is optimized.. Blood pressure is at goal <130/80. LDL is at goal.  - Reviewed goal A1c, goal fasting, and goal 2 hour post prandial glucose. Recommended to check glucose twice a day, Reviewed dietary modifications including limiting foods and beverages containing sugar (provided information to MyChart about ADA plate method for meal planning ., and Reviewed signs and symptoms of hypoglycemia. - Called [employer] to review possible treatment options and costs:   Lantus - cos twould be $13 / 30 days  Trulicity  or Rybelsus  - $72 / 90 days  Mounjaro , Ozempic , Soliqua or xultophy - $150 / 90 days or $50 / 30 days  Dex Com G7 sensor - $20 / 90 days thru [facility] home delivery and $72 /90 days retail Discussed med cost above with patient. She will discuss with her husband.  - Patient to check her calendar and let me know about a time she can come to office and we will provide a trial of DexCom G7 sensor.    Follow Up Plan: 1 to 2 weeks.   Madelin Ray, PharmD Clinical Pharmacist [facility]

## 2024-11-07 ENCOUNTER — Other Ambulatory Visit: Payer: Self-pay | Admitting: Family Medicine

## 2024-11-15 ENCOUNTER — Other Ambulatory Visit: Payer: Self-pay | Admitting: Family Medicine

## 2024-11-15 NOTE — Telephone Encounter (Signed)
 Requesting: lorazepam  1mg   Contract: 04/30/23 UDS: 04/30/23 Last Visit: 09/22/24 Next Visit: 01/30/25 Last Refill: 07/19/24 #90 and 1RF   Please Advise

## 2024-11-28 ENCOUNTER — Encounter: Payer: Self-pay | Admitting: Family Medicine

## 2024-12-05 ENCOUNTER — Other Ambulatory Visit: Payer: Self-pay | Admitting: Family Medicine

## 2024-12-12 ENCOUNTER — Encounter: Payer: Self-pay | Admitting: Family Medicine

## 2024-12-15 NOTE — Telephone Encounter (Signed)
 Called patient to explain that Dr. Domenica was going to complete the form she had dropped off after finishing her visits in office but she requested the form back. She agreed and stated she got very upset because front desk told her that the form will not be completed today,12/15/24 and left. Then said that no one told her that Dr. Domenica would fill the form out for her and she was very upset because the form needed to be turned in today,12/15/24.Pt was advised that a MyChart message was send to her on 12/12/24 to bring the form or upload a better copy of the form, then she said that someone in the office stated that the form was not good and she needs to bring it to the office. Advised patient it is a turn around time for forms to be completed but Dr. Domenica was willing to complete the form for her and she did not want to wait or have the form emailed to her after the form was completed. Pt stated that she is very upset because she might not get the job and she was going to find another PCP and she was advised that a message will be send to Dr. Domenica.

## 2024-12-19 NOTE — Telephone Encounter (Signed)
 Form printed and placed in PCP folder to be completed.

## 2024-12-20 ENCOUNTER — Other Ambulatory Visit: Payer: Self-pay | Admitting: Family Medicine

## 2024-12-20 DIAGNOSIS — T7840XD Allergy, unspecified, subsequent encounter: Secondary | ICD-10-CM

## 2024-12-29 ENCOUNTER — Encounter (HOSPITAL_BASED_OUTPATIENT_CLINIC_OR_DEPARTMENT_OTHER): Payer: Self-pay

## 2024-12-29 ENCOUNTER — Other Ambulatory Visit: Payer: Self-pay

## 2024-12-29 ENCOUNTER — Emergency Department (HOSPITAL_BASED_OUTPATIENT_CLINIC_OR_DEPARTMENT_OTHER): Admission: EM | Admit: 2024-12-29 | Discharge: 2024-12-29 | Disposition: A | Discharge status: Home / Self Care

## 2024-12-29 ENCOUNTER — Emergency Department (HOSPITAL_BASED_OUTPATIENT_CLINIC_OR_DEPARTMENT_OTHER)

## 2024-12-29 DIAGNOSIS — J45909 Unspecified asthma, uncomplicated: Secondary | ICD-10-CM | POA: Insufficient documentation

## 2024-12-29 DIAGNOSIS — R112 Nausea with vomiting, unspecified: Secondary | ICD-10-CM | POA: Diagnosis not present

## 2024-12-29 DIAGNOSIS — R197 Diarrhea, unspecified: Secondary | ICD-10-CM | POA: Insufficient documentation

## 2024-12-29 DIAGNOSIS — Z7722 Contact with and (suspected) exposure to environmental tobacco smoke (acute) (chronic): Secondary | ICD-10-CM | POA: Diagnosis not present

## 2024-12-29 DIAGNOSIS — E119 Type 2 diabetes mellitus without complications: Secondary | ICD-10-CM | POA: Diagnosis not present

## 2024-12-29 DIAGNOSIS — R001 Bradycardia, unspecified: Secondary | ICD-10-CM | POA: Diagnosis not present

## 2024-12-29 DIAGNOSIS — Z7982 Long term (current) use of aspirin: Secondary | ICD-10-CM | POA: Insufficient documentation

## 2024-12-29 DIAGNOSIS — Z7984 Long term (current) use of oral hypoglycemic drugs: Secondary | ICD-10-CM | POA: Diagnosis not present

## 2024-12-29 DIAGNOSIS — Z79899 Other long term (current) drug therapy: Secondary | ICD-10-CM | POA: Diagnosis not present

## 2024-12-29 DIAGNOSIS — I10 Essential (primary) hypertension: Secondary | ICD-10-CM | POA: Diagnosis not present

## 2024-12-29 DIAGNOSIS — R911 Solitary pulmonary nodule: Secondary | ICD-10-CM | POA: Diagnosis not present

## 2024-12-29 DIAGNOSIS — R6889 Other general symptoms and signs: Secondary | ICD-10-CM

## 2024-12-29 DIAGNOSIS — R059 Cough, unspecified: Secondary | ICD-10-CM | POA: Diagnosis present

## 2024-12-29 LAB — CBC WITH DIFFERENTIAL/PLATELET
Abs Immature Granulocytes: 0.03 K/uL (ref 0.00–0.07)
Basophils Absolute: 0 K/uL (ref 0.0–0.1)
Basophils Relative: 0 %
Eosinophils Absolute: 0.1 K/uL (ref 0.0–0.5)
Eosinophils Relative: 2 %
HCT: 37.4 % (ref 36.0–46.0)
Hemoglobin: 12.2 g/dL (ref 12.0–15.0)
Immature Granulocytes: 0 %
Lymphocytes Relative: 26 %
Lymphs Abs: 1.9 K/uL (ref 0.7–4.0)
MCH: 31 pg (ref 26.0–34.0)
MCHC: 32.6 g/dL (ref 30.0–36.0)
MCV: 94.9 fL (ref 80.0–100.0)
Monocytes Absolute: 0.4 K/uL (ref 0.1–1.0)
Monocytes Relative: 5 %
Neutro Abs: 5 K/uL (ref 1.7–7.7)
Neutrophils Relative %: 67 %
Platelets: 245 K/uL (ref 150–400)
RBC: 3.94 MIL/uL (ref 3.87–5.11)
RDW: 12.5 % (ref 11.5–15.5)
WBC: 7.4 K/uL (ref 4.0–10.5)
nRBC: 0 % (ref 0.0–0.2)

## 2024-12-29 LAB — COMPREHENSIVE METABOLIC PANEL WITH GFR
ALT: 25 U/L (ref 0–44)
AST: 31 U/L (ref 15–41)
Albumin: 4.3 g/dL (ref 3.5–5.0)
Alkaline Phosphatase: 67 U/L (ref 38–126)
Anion gap: 13 (ref 5–15)
BUN: 21 mg/dL — ABNORMAL HIGH (ref 6–20)
CO2: 22 mmol/L (ref 22–32)
Calcium: 9.6 mg/dL (ref 8.9–10.3)
Chloride: 102 mmol/L (ref 98–111)
Creatinine, Ser: 1.16 mg/dL — ABNORMAL HIGH (ref 0.44–1.00)
GFR, Estimated: 56 mL/min — ABNORMAL LOW
Glucose, Bld: 191 mg/dL — ABNORMAL HIGH (ref 70–99)
Potassium: 5.2 mmol/L — ABNORMAL HIGH (ref 3.5–5.1)
Sodium: 137 mmol/L (ref 135–145)
Total Bilirubin: 0.7 mg/dL (ref 0.0–1.2)
Total Protein: 7.4 g/dL (ref 6.5–8.1)

## 2024-12-29 LAB — CK: Total CK: 101 U/L (ref 38–234)

## 2024-12-29 MED ORDER — IOHEXOL 350 MG/ML SOLN
100.0000 mL | Freq: Once | INTRAVENOUS | Status: AC | PRN
  Administered 2024-12-29: 100 mL via INTRAVENOUS

## 2024-12-29 MED ORDER — SODIUM CHLORIDE 0.9 % IV BOLUS
1000.0000 mL | Freq: Once | INTRAVENOUS | Status: AC
  Administered 2024-12-29: 1000 mL via INTRAVENOUS

## 2024-12-29 NOTE — ED Triage Notes (Signed)
 Pt states that there has been some sickness going through the house. Youngest had the flu, now the rest are vomiting, coughing, weak. Pt started feeling bad 2 weeks ago.

## 2024-12-29 NOTE — Discharge Instructions (Signed)
 Your work-up in the ER today was reassuring for acute findings.  Given exposures and your family and your description of symptoms, I suspect that your symptoms are related to influenza.  As this is a viral illness, no antibiotics are indicated.  Please be sure to get plenty of rest and maintain adequate oral hydration with fluids specifically with those with electrolytes such as Gatorade or Pedialyte.  I also recommend:  Increased fluid intake. Sports drinks offer valuable electrolytes, sugars, and fluids.  Breathing heated mist or steam (vaporizer or shower).  Eating chicken soup or other clear broths, and maintaining good nutrition.   Increasing usage of your inhaler if you have asthma.  Return to work when your temperature has returned to normal.  Gargle warm salt water and spit it out for sore throat. Take benadryl or Zyrtec  to decrease sinus secretions.   Additionally, incidentally we did find a lung nodule on imaging of your chest.  I doubt this is related to the symptoms that brought you in today, however it is very important that you follow-up closely with your primary care provider to have this further evaluated.  Please call tomorrow morning to schedule a follow-up appointment.  Your heart rate was a bit slow today as well, please discuss at this appointment if you should still be taking the metoprolol  that you are on.  Follow Up: Follow up with your primary care doctor in 5-7 days for recheck of ongoing symptoms.  Return to emergency department for emergent changing or worsening of symptoms.

## 2024-12-29 NOTE — ED Provider Notes (Signed)
 " [facility] Provider Note   CSN: [id number] Arrival date & time: 12/29/24  1225     Patient presents with: Influenza   April Warren is a 54 y.o. female.   Patient history of diabetes, hypertension, hyperlipidemia, obesity presents today with complaints of weakness. Reports symptoms began initially 2 weeks ago. Reports that everyone in her household has been sick with similar symptoms, one of her family members that she has been around tested positive for the flu.  She is certain that she had this as well.  Reports that the cough and congestion she had 2 weeks ago has mostly resolved, however she is still feeling very weak and is requiring a cane to get around which is new.  Reports that she feels dehydrated.  States that she has had some nausea, vomiting, and diarrhea.  Denies hematemesis, hematochezia or melena.  Reports that she has not had much of an appetite.  States that she this felt similarly previously and come in and had IV fluids and felt much better.  Presents requesting same.  Denies any chest pain or shortness of breath.  No abdominal pain. Patient has never smoked, but does report significant secondhand smoke exposure.  The history is provided by the patient. No language interpreter was used.  Influenza Presenting symptoms: cough, diarrhea, nausea and vomiting        Prior to Admission medications  Medication Sig Start Date End Date Taking? Authorizing Provider  albuterol  (PROAIR  HFA) 108 (90 Base) MCG/ACT inhaler USE 2 INHALATIONS EVERY 6 HOURS AS NEEDED FOR WHEEZING OR SHORTNESS OF BREATH Patient taking differently: 2 puffs every 6 (six) hours as needed for wheezing or shortness of breath. 09/23/21   [name] LABOR, MD  aspirin EC 81 MG tablet Take 81 mg by mouth daily. Swallow whole.    [provider]  atorvastatin  (LIPITOR) 80 MG tablet TAKE 1 TABLET DAILY 05/03/24   [name] LABOR, MD  Blood Glucose Monitoring Suppl  (FREESTYLE FREEDOM LITE) w/Device KIT Check blood sugar twice daily 10/14/17   [name] LABOR, MD  cetirizine  (ZYRTEC ) 10 MG tablet Take 1 tablet (10 mg total) by mouth daily as needed for allergies or rhinitis. 12/20/24   [name] LABOR, MD  ezetimibe  (ZETIA ) 10 MG tablet TAKE 1 TABLET DAILY 03/09/24   [name] LABOR, MD  fenofibrate  micronized (LOFIBRA) 134 MG capsule Take 1 capsule (134 mg total) by mouth daily before breakfast. 09/05/24   [name] LABOR, MD  gabapentin  (NEURONTIN ) 300 MG capsule TAKE 1 CAPSULE TWICE A DAY AND 2 CAPSULES AT BEDTIME 05/03/24   [name] LABOR, MD  glipiZIDE  (GLUCOTROL ) 10 MG tablet Take 0.5 tablets (5 mg total) by mouth 2 (two) times daily before a meal. Patient taking differently: Take 10 mg by mouth daily before breakfast. 01/26/24   [name] LABOR, MD  glucose blood test strip Use as instructed 02/12/18   [name] LABOR, MD  ibuprofen (ADVIL,MOTRIN) 200 MG tablet Take 400 mg by mouth every 8 (eight) hours as needed.    [provider]  JANUVIA  100 MG tablet TAKE 1 TABLET DAILY 11/07/24   [name] LABOR, MD  Lancets (FREESTYLE) lancets Check blood sugar twice daily 11/17/17   [name] LABOR, MD  lisinopril  (ZESTRIL ) 20 MG tablet Take 1 tablet (20 mg total) by mouth 2 (two) times daily. 12/05/24   [name] LABOR, MD  LORazepam  (ATIVAN ) 1 MG tablet TAKE 1/2 TABLET BY  MOUTH TWICE DAILY AS NEEDED FOR ANXIETY 11/15/24   [name] LABOR, MD  Magnesium Oxide 400 MG CAPS Take 1 capsule by mouth daily. Pt is taking 825mg  capsule every other day. Patient taking differently: Take 1 capsule by mouth daily. Pt is taking 700mg  twice a day    [provider]  meloxicam  (MOBIC ) 7.5 MG tablet Take 1-2 tablets (7.5-15 mg total) by mouth daily. Patient not taking: Reported on 11/02/2024 09/22/24   [name] LABOR, MD  metFORMIN  (GLUCOPHAGE -XR) 750 MG 24 hr tablet Take 1 tablet (750 mg total) by mouth daily with breakfast. 06/13/24   [name] LABOR, MD  metoprolol   tartrate (LOPRESSOR ) 50 MG tablet TAKE 1 TABLET TWICE A DAY 07/26/24   [name] LABOR, MD  Misc Natural Products (BEET ROOT PO) Take 1 tablet by mouth daily.    [provider]  montelukast  (SINGULAIR ) 10 MG tablet TAKE 1 TABLET AT BEDTIME 02/09/24   [name] LABOR, MD  Potassium Bicarbonate 99 MG CAPS Take 2 capsules by mouth daily.    [provider]  tiZANidine  (ZANAFLEX ) 2 MG tablet Take 0.5-2 tablets (1-4 mg total) by mouth 2 (two) times daily between meals as needed for muscle spasms. Patient not taking: Reported on 11/02/2024 09/22/24   [name] LABOR, MD  triamterene -hydrochlorothiazide  (MAXZIDE-25) 37.5-25 MG tablet TAKE 1 TABLET DAILY 07/26/24   [name] LABOR, MD  Vitamin D , Ergocalciferol , (DRISDOL ) 1.25 MG (50000 UNIT) CAPS capsule TAKE 1 CAPSULE EVERY 7 DAYS 12/11/23   Webb, Padonda B, FNP    Allergies: Patient has no known allergies.    Review of Systems  Respiratory:  Positive for cough.   Gastrointestinal:  Positive for diarrhea, nausea and vomiting.  Neurological:  Positive for weakness.  All other systems reviewed and are negative.   Updated Vital Signs BP (!) 137/56 (BP Location: Right Arm)   Pulse (!) 51   Temp 98.1 F (36.7 C) (Oral)   Resp 18   Ht 5' 9 (1.753 m)   Wt 124.7 kg   LMP 10/28/2014   SpO2 95%   BMI 40.61 kg/m   Physical Exam Vitals and nursing note reviewed.  Constitutional:      General: She is not in acute distress.    Appearance: Normal appearance. She is obese. She is not ill-appearing, toxic-appearing or diaphoretic.  HENT:     Head: Normocephalic and atraumatic.  Eyes:     Extraocular Movements: Extraocular movements intact.     Pupils: Pupils are equal, round, and reactive to light.  Cardiovascular:     Rate and Rhythm: Normal rate and regular rhythm.     Heart sounds: Normal heart sounds.  Pulmonary:     Effort: Pulmonary effort is normal. No respiratory distress.     Breath sounds: Normal breath sounds.   Abdominal:     General: Abdomen is flat.     Palpations: Abdomen is soft.     Tenderness: There is no abdominal tenderness.  Musculoskeletal:        General: Normal range of motion.     Cervical back: Normal range of motion.     Right lower leg: No edema.     Left lower leg: No edema.  Skin:    General: Skin is warm and dry.  Neurological:     General: No focal deficit present.     Mental Status: She is alert and oriented to person, place, and time.  Psychiatric:  Mood and Affect: Mood normal.        Behavior: Behavior normal.     (all labs ordered are listed, but only abnormal results are displayed) Labs Reviewed  COMPREHENSIVE METABOLIC PANEL WITH GFR - Abnormal; Notable for the following components:      Result Value   Potassium 5.2 (*)    Glucose, Bld 191 (*)    BUN 21 (*)    Creatinine, Ser 1.16 (*)    GFR, Estimated 56 (*)    All other components within normal limits  CBC WITH DIFFERENTIAL/PLATELET  CK    EKG: EKG Interpretation Date/Time:  Thursday December 29 2024 14:29:24 EST Ventricular Rate:  48 PR Interval:  180 QRS Duration:  89 QT Interval:  459 QTC Calculation: 411 R Axis:   22  Text Interpretation: Sinus bradycardia Low voltage, precordial leads Confirmed by Neysa Clap [phone]) on 12/29/2024 3:14:15 PM  Radiology: [facility] Chest 2 View Result Date: 12/29/2024 EXAM: 2 VIEW(S) XRAY OF THE CHEST 12/29/2024 01:41:16 PM COMPARISON: None available. CLINICAL HISTORY: SOB FINDINGS: LUNGS AND PLEURA: 2.5 cm retrocardiac nodular density.  No pleural effusion. No pneumothorax. HEART AND MEDIASTINUM: No acute abnormality of the cardiac and mediastinal silhouettes. BONES AND SOFT TISSUES: Thoracic degenerative changes. No acute osseous abnormality. IMPRESSION: 1. 2.5 cm retrocardiac nodular density, which may represent a pulmonary nodule/mass. 2. Recommend chest CT for further characterization. Electronically signed by: Waddell Calk MD 12/29/2024 02:12 PM EST RP  Workstation: [id number]     Procedures   Medications Ordered in the ED  sodium chloride  0.9 % bolus 1,000 mL (has no administration in time range)                                    Medical Decision Making Amount and/or Complexity of Data Reviewed Labs: ordered. Radiology: ordered.  Risk Prescription drug management.   This patient is a 54 y.o. female who presents to the ED for concern of weakness, this involves an extensive number of treatment options, and is a complaint that carries with it a high risk of complications and morbidity. The emergent differential diagnosis prior to evaluation includes, but is not limited to, URI, CVA, ACS, arrhythmia, syncope, orthostatic hypotension, sepsis, hypoglycemia, hypoxia, electrolyte disturbance, endocrine disorder, anemia, environmental exposure, polypharmacy   This is not an exhaustive differential.   Past Medical History / Co-morbidities / Social History:  has a past medical history of Allergy, Asthma, Chicken pox (54 yrs old), Diabetes mellitus (08/12/2012), Diabetic retinopathy (HCC), Dysmenorrhea (08/20/2012), HTN (hypertension) (08/20/2012), Hyperlipidemia, mixed (08/28/2013), Neuromuscular disorder (HCC), Neuropathy (02/16/2017), Obesity, Obesity, unspecified (08/28/2013), Other and unspecified hyperlipidemia (08/28/2013), Preventative health care (08/20/2012), Sinusitis (11/14/2013), and Sinusitis, acute (05/02/2016).  Additional history: Chart reviewed.  Physical Exam: Physical exam performed. The pertinent findings include: No acute physical exam abnormalities  Lab Tests: I ordered, and personally interpreted labs.  The pertinent results include:  K 5.2, glucose 191 (hx diabetes), kidney function consistent with baseline, CK WNL.  No leukocytosis or anemia.   Imaging Studies: I ordered imaging studies including CXR, CTA PE. I independently visualized and interpreted imaging which showed   CXR: 1. 2.5 cm retrocardiac  nodular density, which may represent a pulmonary nodule/mass. 2. Recommend chest CT for further characterization.  CTA: 1. No acute intrathoracic pathology. No CT evidence of central pulmonary artery embolus. 2. A 2.5 x 2.0 cm left lower lobe nodule. Multidisciplinary consult and further evaluation with  PET-CT is recommended.  I agree with the radiologist interpretation.   Cardiac Monitoring:  The patient was maintained on a cardiac monitor.  My attending physician viewed and interpreted the cardiac monitored which showed an underlying rhythm of: sinus bradycardia, no STEMI. I agree with this interpretation.   Medications: I ordered medication including IV fluids  for dehydration. Reevaluation of the patient after these medicines showed that the patient improved. I have reviewed the patients home medicines and have made adjustments as needed.   Disposition: After consideration of the diagnostic results and the patients response to treatment, I feel that emergency department workup does not suggest an emergent condition requiring admission or immediate intervention beyond what has been performed at this time. The plan is: Discharge with close outpatient follow-up with return precautions.  Discussed the pulmonary nodule seen on patient's CT imaging as well as recommendations for close outpatient follow-up to continue evaluating this.  She has a PCP that she follows with.  Recommend she call them tomorrow to schedule an appointment.  This nodule is likely incidental and unrelated to symptoms today.  Patient is also bradycardic around 50, she is on metoprolol , recommend she discuss if this is still a good medicine for her with her PCP at her next appointment.  Otherwise, after fluid she feels better and wants to go home.  Suspect she has the flu given exposures in her family.  Discussed same with patient who is understanding.  She is out of the window for Tamiflu. Evaluation and diagnostic testing in the  emergency department does not suggest an emergent condition requiring admission or immediate intervention beyond what has been performed at this time.  Plan for discharge with close PCP follow-up.  Patient is understanding and amenable with plan, educated on red flag symptoms that would prompt immediate return.  Patient discharged in stable condition.  Final diagnoses:  Flu-like symptoms  Bradycardia  Lung nodule seen on imaging study    ED Discharge Orders     None     An After Visit Summary was printed and given to the patient.      Nora Lauraine DELENA DEVONNA 12/29/24 1638  "

## 2024-12-29 NOTE — ED Notes (Signed)
D/c paperwork reviewed with pt, including follow up care.  All questions and/or concerns addressed at time of d/c.  No further needs expressed. . Pt verbalized understanding, Wheeled by family to ED exit, NAD.

## 2024-12-29 NOTE — ED Notes (Addendum)
 Lab notified to add-on CK to previously collected blood sample.

## 2024-12-29 NOTE — ED Notes (Addendum)
 Pt 1-person assistance to stand-pivot to wheelchair, and then to bathroom. Assisted back to room and back into stretcher, call bell within reach, spouse at bedside.

## 2024-12-30 ENCOUNTER — Telehealth: Payer: Self-pay

## 2024-12-30 ENCOUNTER — Ambulatory Visit: Payer: Self-pay

## 2024-12-30 DIAGNOSIS — E782 Mixed hyperlipidemia: Secondary | ICD-10-CM

## 2024-12-30 MED ORDER — EZETIMIBE 10 MG PO TABS: 10.0000 mg | ORAL_TABLET | Freq: Every day | ORAL | 3 refills | Status: AC

## 2024-12-30 NOTE — Telephone Encounter (Signed)
 FYI Only or Action Required?: FYI only for provider: appointment scheduled on 01/02/25.  Patient was last seen in primary care on 09/22/2024 by [name] LABOR, MD.  Called Nurse Triage reporting Fatigue.  Symptoms began several weeks ago.  Interventions attempted: OTC medications: Tylenol and ibuprofen.  Symptoms are: gradually worsening.  Triage Disposition: See PCP When Office is Open (Within 3 Days)  Patient/caregiver understands and will follow disposition?: Yes    Spoke with pts husband Lynwood. On DPR. There with pt now. Legs spasming/tightening up when she walks and weakness x3 weeks. Went to ED yesterday. Dx with the flu. Also got a chest CT which showed she has a spot on her lung that needs to be f/u on. Felt better after getting IVF and dc'd home with instructions to f/u with PCP.   Reporting persistent generalized weakness as well as sore throat, pain both ears, nasal congestion, and cough. Has been getting around with a cane starting a few days ago d/t increased weakness. Denies numbness, tingling, or changes to speech or vision. No SOB or CP. No fever. BG has been 130-150. HR was in the 50s in the ED yesterday and persists now. Taking metoprolol  50 mg BID, advised at that time to f/u with PCP. Has been drinking Gatorade, urinating fine. ED f/u scheduled Monday. Advised UC or ED for worsening symptoms.     Copied from CRM [id number]. Topic: Clinical - Red Word Triage >> Dec 30, 2024  1:27 PM China J wrote: [facility] that prompted transfer to Nurse Triage: Leg numbness that is limiting the patient from walking. Her husband is having to help her move. She was in the ED yesterday but is still struggling today. She would like to speak with Dr. Domenica if possible.  (Spouse is on DPR list.) Reason for Disposition  [1] Fatigue (i.e., tires easily, decreased energy) AND [2] persists > 1 week  Answer Assessment - Initial Assessment Questions 1. DESCRIPTION: Describe how you are  feeling.     Weak and fatigued  2. SEVERITY: How bad is it?  Can you stand and walk?     8/10, able to stand and walk but using cane to walk short distances  3. ONSET: When did these symptoms begin? (e.g., hours, days, weeks, months)     3 weeks ago  4. CAUSE: What do you think is causing the weakness or fatigue? (e.g., not drinking enough fluids, medical problem, trouble sleeping)     Recent flu dx, dehydration  5. NEW MEDICINES:  Have you started on any new medicines recently? (e.g., opioid pain medicines, benzodiazepines, muscle relaxants, antidepressants, antihistamines, neuroleptics, beta blockers)     Denies  6. OTHER SYMPTOMS: Do you have any other symptoms? (e.g., chest pain, fever, cough, SOB, vomiting, diarrhea, bleeding, other areas of pain)     Sore throat, pain both ears, nasal congestion, cough  Protocols used: Weakness (Generalized) and Fatigue-A-AH

## 2024-12-30 NOTE — Telephone Encounter (Signed)
 Pharmacy send a request for Ezetimibe .

## 2024-12-30 NOTE — Progress Notes (Signed)
 [facility] [address] [place], [place] [address] [phone]  Date:  01/02/2025   Name:  April Warren   DOB:  July 09, 1971   MRN:  [id number]  PCP:  [name] LABOR, MD    Chief Complaint: Hospitalization Follow-up (I'm still not myself, I'm very weak husband states she's need help with everything from walking to sitting )   History of Present Illness:  April Warren is a 54 y.o. very pleasant female patient who presents with the following:  Pt seen today for follow-up- she was seen in the ER on 12/29/24 with flu like sx.  I have not seen her myself in the past  History of DM, HTN, hyperlipidemia, CRI Patient history of diabetes, hypertension, hyperlipidemia, obesity presents today with complaints of weakness. Reports symptoms began initially 2 weeks ago. Reports that everyone in her household has been sick with similar symptoms, one of her family members that she has been around tested positive for the flu. She is certain that she had this as well. Reports that the cough and congestion she had 2 weeks ago has mostly resolved, however she is still feeling very weak and is requiring a cane to get around which is new. Reports that she feels dehydrated. States that she has had some nausea, vomiting, and diarrhea.   She did have a CT chest which showed finding needed further assessment  IMPRESSION: 1. No acute intrathoracic pathology. No CT evidence of central pulmonary artery embolus. 2. A 2.5 x 2.0 cm left lower lobe nodule. Multidisciplinary consult and further evaluation with PET-CT is recommended.  ER also did some labs for her- did not test for flu or covid at the emergency room.  They also had not tested at home.  Lab Results  Component Value Date   HGBA1C 8.1 (H) 09/22/2024    Discussed the use of AI scribe software for clinical note transcription with the patient, who gave verbal consent to proceed.  History of  Present Illness April Warren is a 54 year old female with diabetes who presents with persistent weakness following a recent illness.  She has been experiencing persistent weakness following an illness that began approximately three to four weeks ago. Initially, she had symptoms resembling a head cold, which progressed to nausea, vomiting, and diarrhea. While the gastrointestinal symptoms have resolved, she continues to feel weak and requires assistance from her partner to stand and move around. She also reports a persistent cough and significant congestion in her ears and throat. No fever or vomiting has been noted in the past couple of weeks. Her appetite is reduced, and she has not been eating or drinking adequately, which may contribute to her weakness.  The patient and her husband do note that if she is still ill she is making progress towards getting better.  She was exposed to a positive flu contact at home, as her three-year-old child and other family members were ill. However, she was not tested for the flu at the hospital. During a recent hospital visit on New Year's Day, a CT chest was performed to rule out a blood clot, which was negative. However, a lung nodule was incidentally found. She has no history of smoking, although her father and brother were smokers, and she was exposed to secondhand smoke. She has not had a CT of her chest before, so it is unclear if the nodule is new.  She has a history  of diabetes, and her blood sugar levels have been between 140 and 153. She is currently taking Tylenol  for symptom relief. She mentions a fall two days before Christmas, which resulted in knee pain and may have contributed to her current state of fatigue and reduced mobility.  She lives in a household with seven people including 5 kids, there has been a lot of illness going around   Patient Active Problem List   Diagnosis Date Noted   Vitamin D  deficiency 04/30/2023   Right shoulder pain  04/15/2022   Left hip pain 04/15/2022   Contact with and (suspected) exposure to covid-19 01/15/2020   Atrophic vaginitis 01/06/2019   Anxiety 08/19/2018   Muscle cramps 07/13/2017   Neuropathy 02/16/2017   Breast cancer screening 02/03/2016   Cervical cancer screening 11/14/2013   Hyperlipidemia, mixed 08/28/2013   HTN (hypertension) 08/20/2012   Preventative health care 08/20/2012   Dysmenorrhea 08/20/2012   Diabetic retinopathy (HCC)    Obesity    Allergy    Type 2 diabetes mellitus with obesity 08/12/2012    Past Medical History:  Diagnosis Date   Allergy    Asthma    Certain time of the year   Chicken pox 54 yrs old   Diabetes mellitus 08/12/2012   type 2- dr alvia diagnosed   Diabetic retinopathy (HCC)    Dysmenorrhea 08/20/2012   HTN (hypertension) 08/20/2012   Hyperlipidemia, mixed 08/28/2013   Neuromuscular disorder (HCC)    Not sure of date   Neuropathy 02/16/2017   Obesity    Obesity, unspecified 08/28/2013   Other and unspecified hyperlipidemia 08/28/2013   Preventative health care 08/20/2012   Sinusitis 11/14/2013   Sinusitis, acute 05/02/2016    Past Surgical History:  Procedure Laterality Date   BREAST BIOPSY Right 01/29/2024   US  RT BREAST BX W LOC DEV 1ST LESION IMG BX SPEC US  GUIDE 01/29/2024 [facility]   EYE SURGERY     Laser surgery for bleeds   REFRACTIVE SURGERY  08/12/2012   right, left on 09/16/12    Social History[1]  Family History  Problem Relation Age of Onset   Cancer Mother 46       breast   Diabetes Mother        type 2   Hypertension Mother    Hypertension Father    Hyperlipidemia Father    COPD Father    Arthritis Father    Asthma Father    Hypertension Maternal Grandmother    Hyperlipidemia Maternal Grandmother    COPD Maternal Grandmother    Asthma Maternal Grandmother    Hypertension Maternal Grandfather    Heart attack Maternal Grandfather    Stroke Maternal Grandfather    Gout Brother    Cancer  Brother        lung   Heart disease Paternal Grandfather    Alcohol abuse Paternal Grandfather     Allergies[2]  Medication list has been reviewed and updated.  Medications Ordered Prior to Encounter[3]  Review of Systems:  As per HPI- otherwise negative.  Pulse Readings from Last 3 Encounters:  01/02/25 70  12/29/24 (!) 50  09/22/24 (!) 55      Physical Examination: Vitals:   01/02/25 1052  BP: 122/80  Pulse: 70  SpO2: 95%   Vitals:   01/02/25 1052  Weight: 275 lb (124.7 kg)  Height: 5' 9 (1.753 m)   Body mass index is 40.61 kg/m. Ideal Body Weight: Weight in (lb) to have BMI = 25: 168.9  GEN: no acute distress.  Obese, does not appear toxic.  She is sitting in clinic provided wheelchair today HEENT: Atraumatic, Normocephalic.  Ears and Nose: No external deformity. CV: RRR, No M/G/R. No JVD. No thrill. No extra heart sounds. PULM: CTA B, no wheezes, crackles, rhonchi. No retractions. No resp. distress. No accessory muscle use. ABD: S, NT, ND,  Belly is benign EXTR: No c/c/e PSYCH: Normally interactive. Conversant.    Assessment and Plan: Bronchitis - Plan: doxycycline  (VIBRAMYCIN ) 100 MG capsule  Nodule of left lung - Plan: Ambulatory referral to Pulmonology, NM PET Image Initial (PI) Skull Base To Thigh (F-18 FDG)  Assessment & Plan Bronchitis and acute sinusitis Symptoms suggest bronchitis with possible sinusitis. - Prescribed doxycycline  100 mg twice daily for 10 days. - Advised taking doxycycline  with food and water. - Encouraged increased fluid intake and nutrition. If she is not getting better over the next several days they will let me know, we can repeat blood work and other evaluation as needed  Solitary pulmonary nodule, left lung Incidental solitary pulmonary nodule found on CT. No smoking history suggests benign potential. Further evaluation needed. - Ordered PET CT scan to assess nodule. - Referred to pulmonology for further evaluation  and potential biopsy. - Advised follow-up if no contact regarding PET scan.  Type 2 diabetes mellitus with chronic kidney disease Blood sugar levels acceptable. Kidney function improved since September. - Monitor blood sugar levels regularly.  Bradycardia Bradycardia noted during hospital visit; normal heart rate today. Likely due to illness. - Monitor heart rate at home. - Advised to report if heart rate consistently falls below 60 bpm. - Continue current metoprolol  dosage unless advised otherwise.  Signed Harlene Schroeder, MD     [1]  Social History Tobacco Use   Smoking status: Never   Smokeless tobacco: Never  Substance Use Topics   Alcohol use: Yes    Comment: One or two a year   Drug use: No  [2] No Known Allergies [3]  Current Outpatient Medications on File Prior to Visit  Medication Sig Dispense Refill   albuterol  (PROAIR  HFA) 108 (90 Base) MCG/ACT inhaler USE 2 INHALATIONS EVERY 6 HOURS AS NEEDED FOR WHEEZING OR SHORTNESS OF BREATH (Patient taking differently: 2 puffs every 6 (six) hours as needed for wheezing or shortness of breath.) 18 g 5   aspirin EC 81 MG tablet Take 81 mg by mouth daily. Swallow whole.     atorvastatin  (LIPITOR) 80 MG tablet TAKE 1 TABLET DAILY 90 tablet 3   Blood Glucose Monitoring Suppl (FREESTYLE FREEDOM LITE) w/Device KIT Check blood sugar twice daily 1 each 0   cetirizine  (ZYRTEC ) 10 MG tablet Take 1 tablet (10 mg total) by mouth daily as needed for allergies or rhinitis. 90 tablet 1   ezetimibe  (ZETIA ) 10 MG tablet Take 1 tablet (10 mg total) by mouth daily. 90 tablet 3   fenofibrate  micronized (LOFIBRA) 134 MG capsule Take 1 capsule (134 mg total) by mouth daily before breakfast. 90 capsule 1   gabapentin  (NEURONTIN ) 300 MG capsule TAKE 1 CAPSULE TWICE A DAY AND 2 CAPSULES AT BEDTIME 360 capsule 3   glipiZIDE  (GLUCOTROL ) 10 MG tablet Take 0.5 tablets (5 mg total) by mouth 2 (two) times daily before a meal. (Patient taking differently: Take  10 mg by mouth daily before breakfast.) 180 tablet 1   glucose blood test strip Use as instructed 100 each 12   ibuprofen (ADVIL,MOTRIN) 200 MG tablet Take 400 mg by mouth every 8 (  eight) hours as needed.     JANUVIA  100 MG tablet TAKE 1 TABLET DAILY 90 tablet 3   Lancets (FREESTYLE) lancets Check blood sugar twice daily 200 each 5   lisinopril  (ZESTRIL ) 20 MG tablet Take 1 tablet (20 mg total) by mouth 2 (two) times daily. 180 tablet 1   LORazepam  (ATIVAN ) 1 MG tablet TAKE 1/2 TABLET BY MOUTH TWICE DAILY AS NEEDED FOR ANXIETY 90 tablet 1   Magnesium Oxide 400 MG CAPS Take 1 capsule by mouth daily. Pt is taking 825mg  capsule every other day. (Patient taking differently: Take 1 capsule by mouth daily. Pt is taking 700mg  twice a day)     meloxicam  (MOBIC ) 7.5 MG tablet Take 1-2 tablets (7.5-15 mg total) by mouth daily. 40 tablet 1   metFORMIN  (GLUCOPHAGE -XR) 750 MG 24 hr tablet Take 1 tablet (750 mg total) by mouth daily with breakfast.     metoprolol  tartrate (LOPRESSOR ) 50 MG tablet TAKE 1 TABLET TWICE A DAY 180 tablet 3   Misc Natural Products (BEET ROOT PO) Take 1 tablet by mouth daily.     montelukast  (SINGULAIR ) 10 MG tablet TAKE 1 TABLET AT BEDTIME 90 tablet 3   Potassium Bicarbonate 99 MG CAPS Take 2 capsules by mouth daily.     tiZANidine  (ZANAFLEX ) 2 MG tablet Take 0.5-2 tablets (1-4 mg total) by mouth 2 (two) times daily between meals as needed for muscle spasms. 40 tablet 1   triamterene -hydrochlorothiazide  (MAXZIDE-25) 37.5-25 MG tablet TAKE 1 TABLET DAILY 90 tablet 3   Vitamin D , Ergocalciferol , (DRISDOL ) 1.25 MG (50000 UNIT) CAPS capsule TAKE 1 CAPSULE EVERY 7 DAYS 12 capsule 3   No current facility-administered medications on file prior to visit.   "

## 2024-12-30 NOTE — Telephone Encounter (Signed)
 Appt scheduled on 1/05

## 2024-12-30 NOTE — Telephone Encounter (Signed)
 Copied from CRM [id number]. Topic: Clinical - Medical Advice >> Dec 30, 2024 11:05 AM Berneda FALCON wrote: Reason for CRM: ER last night-pt went in for leg numbness-could hardly walk-she saw PCP a couple months ago for muscle spasms. Was dx with flu while in ER. Found a nodule with Xray and recommends to get PET Scan. She did not wish to schedule a hospital follow up at this time, just wanted the nurse to call her to discuss what she should do, please.  Patient callback is [phone] (home)

## 2025-01-02 ENCOUNTER — Emergency Department (HOSPITAL_COMMUNITY)
Admission: EM | Admit: 2025-01-02 | Discharge: 2025-01-02 | Disposition: A | Discharge status: Home / Self Care | Source: Home / Self Care | Attending: Emergency Medicine | Admitting: Emergency Medicine

## 2025-01-02 ENCOUNTER — Emergency Department (HOSPITAL_COMMUNITY)

## 2025-01-02 ENCOUNTER — Ambulatory Visit: Admitting: Family Medicine

## 2025-01-02 ENCOUNTER — Encounter (HOSPITAL_COMMUNITY): Payer: Self-pay

## 2025-01-02 ENCOUNTER — Other Ambulatory Visit: Payer: Self-pay

## 2025-01-02 VITALS — BP 122/80 | HR 70 | Ht 69.0 in | Wt 275.0 lb

## 2025-01-02 DIAGNOSIS — G992 Myelopathy in diseases classified elsewhere: Secondary | ICD-10-CM | POA: Diagnosis not present

## 2025-01-02 DIAGNOSIS — M488X2 Other specified spondylopathies, cervical region: Secondary | ICD-10-CM | POA: Diagnosis not present

## 2025-01-02 DIAGNOSIS — M25562 Pain in left knee: Secondary | ICD-10-CM | POA: Insufficient documentation

## 2025-01-02 DIAGNOSIS — G8252 Quadriplegia, C1-C4 incomplete: Secondary | ICD-10-CM | POA: Diagnosis not present

## 2025-01-02 DIAGNOSIS — M25512 Pain in left shoulder: Secondary | ICD-10-CM | POA: Insufficient documentation

## 2025-01-02 DIAGNOSIS — Z7984 Long term (current) use of oral hypoglycemic drugs: Secondary | ICD-10-CM | POA: Diagnosis not present

## 2025-01-02 DIAGNOSIS — W010XXA Fall on same level from slipping, tripping and stumbling without subsequent striking against object, initial encounter: Secondary | ICD-10-CM | POA: Insufficient documentation

## 2025-01-02 DIAGNOSIS — Z825 Family history of asthma and other chronic lower respiratory diseases: Secondary | ICD-10-CM | POA: Diagnosis not present

## 2025-01-02 DIAGNOSIS — W07XXXA Fall from chair, initial encounter: Secondary | ICD-10-CM | POA: Diagnosis not present

## 2025-01-02 DIAGNOSIS — Z7982 Long term (current) use of aspirin: Secondary | ICD-10-CM | POA: Insufficient documentation

## 2025-01-02 DIAGNOSIS — J45909 Unspecified asthma, uncomplicated: Secondary | ICD-10-CM | POA: Diagnosis not present

## 2025-01-02 DIAGNOSIS — E860 Dehydration: Secondary | ICD-10-CM | POA: Diagnosis present

## 2025-01-02 DIAGNOSIS — Z1152 Encounter for screening for COVID-19: Secondary | ICD-10-CM | POA: Diagnosis not present

## 2025-01-02 DIAGNOSIS — J40 Bronchitis, not specified as acute or chronic: Secondary | ICD-10-CM | POA: Diagnosis not present

## 2025-01-02 DIAGNOSIS — S0990XA Unspecified injury of head, initial encounter: Secondary | ICD-10-CM | POA: Insufficient documentation

## 2025-01-02 DIAGNOSIS — M542 Cervicalgia: Secondary | ICD-10-CM | POA: Insufficient documentation

## 2025-01-02 DIAGNOSIS — E11319 Type 2 diabetes mellitus with unspecified diabetic retinopathy without macular edema: Secondary | ICD-10-CM | POA: Diagnosis not present

## 2025-01-02 DIAGNOSIS — R040 Epistaxis: Secondary | ICD-10-CM | POA: Insufficient documentation

## 2025-01-02 DIAGNOSIS — E1165 Type 2 diabetes mellitus with hyperglycemia: Secondary | ICD-10-CM | POA: Diagnosis not present

## 2025-01-02 DIAGNOSIS — W19XXXA Unspecified fall, initial encounter: Secondary | ICD-10-CM

## 2025-01-02 DIAGNOSIS — E782 Mixed hyperlipidemia: Secondary | ICD-10-CM | POA: Diagnosis not present

## 2025-01-02 DIAGNOSIS — Y92008 Other place in unspecified non-institutional (private) residence as the place of occurrence of the external cause: Secondary | ICD-10-CM | POA: Diagnosis not present

## 2025-01-02 DIAGNOSIS — R911 Solitary pulmonary nodule: Secondary | ICD-10-CM

## 2025-01-02 DIAGNOSIS — E66813 Obesity, class 3: Secondary | ICD-10-CM | POA: Diagnosis not present

## 2025-01-02 DIAGNOSIS — E11649 Type 2 diabetes mellitus with hypoglycemia without coma: Secondary | ICD-10-CM | POA: Diagnosis not present

## 2025-01-02 DIAGNOSIS — Z6841 Body Mass Index (BMI) 40.0 and over, adult: Secondary | ICD-10-CM | POA: Diagnosis not present

## 2025-01-02 DIAGNOSIS — Z833 Family history of diabetes mellitus: Secondary | ICD-10-CM | POA: Diagnosis not present

## 2025-01-02 DIAGNOSIS — M4802 Spinal stenosis, cervical region: Secondary | ICD-10-CM | POA: Diagnosis not present

## 2025-01-02 DIAGNOSIS — M4801 Spinal stenosis, occipito-atlanto-axial region: Secondary | ICD-10-CM | POA: Diagnosis not present

## 2025-01-02 DIAGNOSIS — K592 Neurogenic bowel, not elsewhere classified: Secondary | ICD-10-CM | POA: Diagnosis not present

## 2025-01-02 DIAGNOSIS — Z91013 Allergy to seafood: Secondary | ICD-10-CM | POA: Diagnosis not present

## 2025-01-02 DIAGNOSIS — N179 Acute kidney failure, unspecified: Secondary | ICD-10-CM | POA: Diagnosis not present

## 2025-01-02 DIAGNOSIS — E1122 Type 2 diabetes mellitus with diabetic chronic kidney disease: Secondary | ICD-10-CM | POA: Diagnosis not present

## 2025-01-02 DIAGNOSIS — I129 Hypertensive chronic kidney disease with stage 1 through stage 4 chronic kidney disease, or unspecified chronic kidney disease: Secondary | ICD-10-CM | POA: Diagnosis not present

## 2025-01-02 DIAGNOSIS — Z8249 Family history of ischemic heart disease and other diseases of the circulatory system: Secondary | ICD-10-CM | POA: Diagnosis not present

## 2025-01-02 MED ORDER — DOXYCYCLINE HYCLATE 100 MG PO CAPS: 100.0000 mg | ORAL_CAPSULE | Freq: Two times a day (BID) | ORAL | 0 refills | Status: DC

## 2025-01-02 NOTE — Discharge Instructions (Signed)
 As discussed, your evaluation today has been largely reassuring.  But, it is important that you monitor your condition carefully, and do not hesitate to return to the ED if you develop new, or concerning changes in your condition. ? ?Otherwise, please follow-up with your physician for appropriate ongoing care. ? ?

## 2025-01-02 NOTE — ED Provider Notes (Signed)
 " [place] EMERGENCY DEPARTMENT AT [facility] Provider Note   CSN: [id number] Arrival date & time: 01/02/25  1314     Patient presents with: [name]   April Warren is a 54 y.o. female.   HPI Pt arrived via [facility] from home. Pt went to Dr. Tomasa and was confirmed to have bronchitis. Upon arrival back home, pt slipped backwards going up the ramp hitting her head, neck, and left shoulder with pain to all areas. No LOC reported, pt takes aspirin. Pt also busted her nose, bleeding controlled at this time. Vitals stable with EMS.   Patient herself notes that she did not lose consciousness, has no limited range of motion in her neck, has no focal weakness in her extremities though she does have some ongoing pain in her left knee, and left leg discomfort from a fall earlier in the week.      Prior to Admission medications  Medication Sig Start Date End Date Taking? Authorizing Provider  albuterol  (PROAIR  HFA) 108 (90 Base) MCG/ACT inhaler USE 2 INHALATIONS EVERY 6 HOURS AS NEEDED FOR WHEEZING OR SHORTNESS OF BREATH Patient taking differently: 2 puffs every 6 (six) hours as needed for wheezing or shortness of breath. 09/23/21   [name] LABOR, MD  aspirin EC 81 MG tablet Take 81 mg by mouth daily. Swallow whole.    [provider]  atorvastatin  (LIPITOR) 80 MG tablet TAKE 1 TABLET DAILY 05/03/24   [name] LABOR, MD  Blood Glucose Monitoring Suppl (FREESTYLE FREEDOM LITE) w/Device KIT Check blood sugar twice daily 10/14/17   [name] LABOR, MD  cetirizine  (ZYRTEC ) 10 MG tablet Take 1 tablet (10 mg total) by mouth daily as needed for allergies or rhinitis. 12/20/24   [name] LABOR, MD  doxycycline  (VIBRAMYCIN ) 100 MG capsule Take 1 capsule (100 mg total) by mouth 2 (two) times daily. 01/02/25   Copland, Harlene BROCKS, MD  ezetimibe  (ZETIA ) 10 MG tablet Take 1 tablet (10 mg total) by mouth daily. 12/30/24   [name] LABOR, MD  fenofibrate  micronized (LOFIBRA) 134 MG capsule Take 1  capsule (134 mg total) by mouth daily before breakfast. 09/05/24   [name] LABOR, MD  gabapentin  (NEURONTIN ) 300 MG capsule TAKE 1 CAPSULE TWICE A DAY AND 2 CAPSULES AT BEDTIME 05/03/24   [name] LABOR, MD  glipiZIDE  (GLUCOTROL ) 10 MG tablet Take 0.5 tablets (5 mg total) by mouth 2 (two) times daily before a meal. Patient taking differently: Take 10 mg by mouth daily before breakfast. 01/26/24   [name] LABOR, MD  glucose blood test strip Use as instructed 02/12/18   [name] LABOR, MD  ibuprofen (ADVIL,MOTRIN) 200 MG tablet Take 400 mg by mouth every 8 (eight) hours as needed.    [provider]  JANUVIA  100 MG tablet TAKE 1 TABLET DAILY 11/07/24   [name] LABOR, MD  Lancets (FREESTYLE) lancets Check blood sugar twice daily 11/17/17   [name] LABOR, MD  lisinopril  (ZESTRIL ) 20 MG tablet Take 1 tablet (20 mg total) by mouth 2 (two) times daily. 12/05/24   [name] LABOR, MD  LORazepam  (ATIVAN ) 1 MG tablet TAKE 1/2 TABLET BY MOUTH TWICE DAILY AS NEEDED FOR ANXIETY 11/15/24   [name] LABOR, MD  Magnesium Oxide 400 MG CAPS Take 1 capsule by mouth daily. Pt is taking 825mg  capsule every other day. Patient taking differently: Take 1 capsule by mouth daily. Pt is taking 700mg  twice a day    [provider]  meloxicam  (MOBIC ) 7.5 MG tablet Take 1-2 tablets (7.5-15 mg total) by mouth daily. 09/22/24   [name] LABOR, MD  metFORMIN  (GLUCOPHAGE -XR) 750 MG 24 hr tablet Take 1 tablet (750 mg total) by mouth daily with breakfast. 06/13/24   [name] LABOR, MD  metoprolol  tartrate (LOPRESSOR ) 50 MG tablet TAKE 1 TABLET TWICE A DAY 07/26/24   [name] LABOR, MD  Misc Natural Products (BEET ROOT PO) Take 1 tablet by mouth daily.    [provider]  montelukast  (SINGULAIR ) 10 MG tablet TAKE 1 TABLET AT BEDTIME 02/09/24   [name] LABOR, MD  Potassium Bicarbonate 99 MG CAPS Take 2 capsules by mouth daily.    [provider]  tiZANidine  (ZANAFLEX ) 2 MG tablet Take 0.5-2  tablets (1-4 mg total) by mouth 2 (two) times daily between meals as needed for muscle spasms. 09/22/24   [name] LABOR, MD  triamterene -hydrochlorothiazide  (MAXZIDE-25) 37.5-25 MG tablet TAKE 1 TABLET DAILY 07/26/24   [name] LABOR, MD  Vitamin D , Ergocalciferol , (DRISDOL ) 1.25 MG (50000 UNIT) CAPS capsule TAKE 1 CAPSULE EVERY 7 DAYS 12/11/23   Webb, Padonda B, FNP    Allergies: Patient has no known allergies.    Review of Systems  Updated Vital Signs BP 131/68 (BP Location: Left Arm)   Pulse (!) 55   Temp 98.8 F (37.1 C) (Oral)   Resp 18   Ht 1.753 m (5' 9)   Wt 124.7 kg   LMP 10/28/2014   SpO2 94%   BMI 40.61 kg/m   Physical Exam Vitals and nursing note reviewed.  Constitutional:      General: She is not in acute distress.    Appearance: She is well-developed. She is obese.  HENT:     Head: Normocephalic and atraumatic.  Eyes:     Conjunctiva/sclera: Conjunctivae normal.  Cardiovascular:     Rate and Rhythm: Normal rate and regular rhythm.  Pulmonary:     Effort: Pulmonary effort is normal. No respiratory distress.     Breath sounds: Normal breath sounds. No stridor.  Abdominal:     General: There is no distension.  Musculoskeletal:     Cervical back: No spinous process tenderness or muscular tenderness.     Comments: Knee grossly unremarkable, though tender to palpation  Skin:    General: Skin is warm and dry.  Neurological:     General: No focal deficit present.     Mental Status: She is alert and oriented to person, place, and time.     Cranial Nerves: No cranial nerve deficit.  Psychiatric:        Mood and Affect: Mood normal.     (all labs ordered are listed, but only abnormal results are displayed) Labs Reviewed - No data to display  EKG: None  Radiology: DG Knee Complete 4 Views Left Result Date: 01/02/2025 EXAM: 4 VIEW(S) XRAY OF THE LEFT KNEE 01/02/2025 02:40:00 PM COMPARISON: None available. CLINICAL HISTORY: fall FINDINGS: BONES AND JOINTS:  No acute fracture. No malalignment. No joint effusion. Enthesophytes at the quadriceps tendon insertion and patellar tendon origin/insertion. Mild patellofemoral osteoarthritis. SOFT TISSUES: The soft tissues are unremarkable. IMPRESSION: 1. No evidence of acute traumatic injury. Electronically signed by: Rockey Kilts MD 01/02/2025 03:28 PM EST RP Workstation: [id number]   CT Cervical Spine Wo Contrast Result Date: 01/02/2025 EXAM: CT CERVICAL SPINE WITHOUT CONTRAST 01/02/2025 02:54:49 PM TECHNIQUE: CT of the cervical spine was performed without the administration of intravenous contrast. Multiplanar reformatted images are  provided for review. Automated exposure control, iterative reconstruction, and/or weight based adjustment of the mA/kV was utilized to reduce the radiation dose to as low as reasonably achievable. COMPARISON: None available. CLINICAL HISTORY: Polytrauma, blunt. Slipped backwards going up a ramp and fell, hitting the head, neck, and left shoulder with pain to these areas. FINDINGS: BONES AND ALIGNMENT: No acute fracture or traumatic malalignment. 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 Broad posterior disc osteophyte complexes and/or ossification of the posterior longitudinal ligament at T2-T3 resulting in moderate spinal stenosis. SOFT TISSUES: No prevertebral soft tissue swelling. IMPRESSION: 1. No acute cervical spine fracture or traumatic malalignment. 2. 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 Electronically signed by: Dasie Hamburg MD 01/02/2025 03:25 PM EST RP Workstation: [id number]   CT Head Wo Contrast Result Date: 01/02/2025 EXAM: CT HEAD WITHOUT CONTRAST 01/02/2025 02:54:49 PM TECHNIQUE: CT of the head was performed without the administration of intravenous contrast. Automated exposure  control, iterative reconstruction, and/or weight based adjustment of the mA/kV was utilized to reduce the radiation dose to as low as reasonably achievable. COMPARISON: None available. CLINICAL HISTORY: Polytrauma, blunt. Slipped backwards going up a ramp and fell, hitting the head, neck, and left shoulder with pain to these areas. FINDINGS: BRAIN AND VENTRICLES: There is no evidence of an acute infarct, intracranial hemorrhage, mass, midline shift, hydrocephalus, or extra-axial fluid collection. Cerebral volume is normal. Calcified atherosclerosis at the skull base. ORBITS: No acute abnormality. SINUSES: Mild mucosal thickening in the ethmoid sinuses. Clear mastoid air cells. SOFT TISSUES AND SKULL: No acute soft tissue abnormality. No skull fracture. IMPRESSION: 1. No acute intracranial abnormality. Electronically signed by: Dasie Hamburg MD 01/02/2025 03:19 PM EST RP Workstation: [id number]     Procedures   Medications Ordered in the ED - No data to display                                  Medical Decision Making Adult female presents after mechanical fall.  Patient had earlier nosebleed, but has none currently, she is awake, alert, hemodynamically unremarkable.  Concern for intracranial abnormality versus fracture versus sprain.   Amount and/or Complexity of Data Reviewed Independent Historian: EMS Radiology: ordered and independent interpretation performed. Decision-making details documented in ED Course.  Risk Decision regarding hospitalization. Diagnosis or treatment significantly limited by social determinants of health.   4:48 PM Patient awake and alert, in no distress.  Knee exam generally unremarkable, well-healing scab.  X-rays reviewed, no fracture, CTs reviewed, no intracranial hemorrhage, patient discharged in stable condition.     Final diagnoses:  Fall, initial encounter    ED Discharge Orders     None          Garrick Charleston, MD 01/02/25 1648  "

## 2025-01-02 NOTE — ED Triage Notes (Signed)
 Pt arrived via [facility] from home. Pt went to Dr. Tomasa and was confirmed to have bronchitis. Upon arrival back home, pt slipped backwards going up the ramp hitting her head, neck, and left shoulder with pain to all areas. No LOC reported, pt takes aspirin. Pt also busted her nose, bleeding controlled at this time. Vitals stable with EMS.

## 2025-01-04 ENCOUNTER — Emergency Department (HOSPITAL_COMMUNITY)

## 2025-01-04 ENCOUNTER — Telehealth: Payer: Self-pay

## 2025-01-04 ENCOUNTER — Other Ambulatory Visit: Payer: Self-pay

## 2025-01-04 ENCOUNTER — Encounter (HOSPITAL_COMMUNITY): Payer: Self-pay

## 2025-01-04 ENCOUNTER — Inpatient Hospital Stay (HOSPITAL_COMMUNITY)
Admission: EM | Admit: 2025-01-04 | Discharge: 2025-01-12 | DRG: 471 | Disposition: A | Discharge status: Rehabilitation Facility | Attending: Internal Medicine | Admitting: Internal Medicine

## 2025-01-04 DIAGNOSIS — Z8249 Family history of ischemic heart disease and other diseases of the circulatory system: Secondary | ICD-10-CM

## 2025-01-04 DIAGNOSIS — Z823 Family history of stroke: Secondary | ICD-10-CM

## 2025-01-04 DIAGNOSIS — K592 Neurogenic bowel, not elsewhere classified: Secondary | ICD-10-CM | POA: Diagnosis present

## 2025-01-04 DIAGNOSIS — N183 Chronic kidney disease, stage 3 unspecified: Secondary | ICD-10-CM

## 2025-01-04 DIAGNOSIS — Z981 Arthrodesis status: Secondary | ICD-10-CM

## 2025-01-04 DIAGNOSIS — G4700 Insomnia, unspecified: Secondary | ICD-10-CM | POA: Diagnosis present

## 2025-01-04 DIAGNOSIS — I129 Hypertensive chronic kidney disease with stage 1 through stage 4 chronic kidney disease, or unspecified chronic kidney disease: Secondary | ICD-10-CM | POA: Diagnosis present

## 2025-01-04 DIAGNOSIS — N179 Acute kidney failure, unspecified: Secondary | ICD-10-CM | POA: Diagnosis present

## 2025-01-04 DIAGNOSIS — E119 Type 2 diabetes mellitus without complications: Secondary | ICD-10-CM | POA: Diagnosis not present

## 2025-01-04 DIAGNOSIS — E11649 Type 2 diabetes mellitus with hypoglycemia without coma: Secondary | ICD-10-CM | POA: Diagnosis present

## 2025-01-04 DIAGNOSIS — R197 Diarrhea, unspecified: Secondary | ICD-10-CM | POA: Diagnosis present

## 2025-01-04 DIAGNOSIS — Z79899 Other long term (current) drug therapy: Secondary | ICD-10-CM

## 2025-01-04 DIAGNOSIS — G8252 Quadriplegia, C1-C4 incomplete: Secondary | ICD-10-CM | POA: Diagnosis present

## 2025-01-04 DIAGNOSIS — E782 Mixed hyperlipidemia: Secondary | ICD-10-CM | POA: Diagnosis present

## 2025-01-04 DIAGNOSIS — N319 Neuromuscular dysfunction of bladder, unspecified: Secondary | ICD-10-CM | POA: Diagnosis present

## 2025-01-04 DIAGNOSIS — M4801 Spinal stenosis, occipito-atlanto-axial region: Secondary | ICD-10-CM | POA: Diagnosis present

## 2025-01-04 DIAGNOSIS — T380X5A Adverse effect of glucocorticoids and synthetic analogues, initial encounter: Secondary | ICD-10-CM | POA: Diagnosis not present

## 2025-01-04 DIAGNOSIS — E66813 Obesity, class 3: Secondary | ICD-10-CM | POA: Diagnosis present

## 2025-01-04 DIAGNOSIS — Z83438 Family history of other disorder of lipoprotein metabolism and other lipidemia: Secondary | ICD-10-CM

## 2025-01-04 DIAGNOSIS — E860 Dehydration: Principal | ICD-10-CM | POA: Diagnosis present

## 2025-01-04 DIAGNOSIS — Z791 Long term (current) use of non-steroidal anti-inflammatories (NSAID): Secondary | ICD-10-CM

## 2025-01-04 DIAGNOSIS — R339 Retention of urine, unspecified: Secondary | ICD-10-CM | POA: Diagnosis present

## 2025-01-04 DIAGNOSIS — Z7984 Long term (current) use of oral hypoglycemic drugs: Secondary | ICD-10-CM

## 2025-01-04 DIAGNOSIS — Z6841 Body Mass Index (BMI) 40.0 and over, adult: Secondary | ICD-10-CM

## 2025-01-04 DIAGNOSIS — W19XXXA Unspecified fall, initial encounter: Secondary | ICD-10-CM | POA: Diagnosis present

## 2025-01-04 DIAGNOSIS — M4802 Spinal stenosis, cervical region: Principal | ICD-10-CM | POA: Diagnosis present

## 2025-01-04 DIAGNOSIS — J40 Bronchitis, not specified as acute or chronic: Secondary | ICD-10-CM

## 2025-01-04 DIAGNOSIS — Y92008 Other place in unspecified non-institutional (private) residence as the place of occurrence of the external cause: Secondary | ICD-10-CM

## 2025-01-04 DIAGNOSIS — Z8261 Family history of arthritis: Secondary | ICD-10-CM

## 2025-01-04 DIAGNOSIS — E669 Obesity, unspecified: Secondary | ICD-10-CM

## 2025-01-04 DIAGNOSIS — J45909 Unspecified asthma, uncomplicated: Secondary | ICD-10-CM | POA: Diagnosis present

## 2025-01-04 DIAGNOSIS — R5381 Other malaise: Secondary | ICD-10-CM | POA: Diagnosis present

## 2025-01-04 DIAGNOSIS — Z7982 Long term (current) use of aspirin: Secondary | ICD-10-CM

## 2025-01-04 DIAGNOSIS — E11319 Type 2 diabetes mellitus with unspecified diabetic retinopathy without macular edema: Secondary | ICD-10-CM | POA: Diagnosis present

## 2025-01-04 DIAGNOSIS — E1122 Type 2 diabetes mellitus with diabetic chronic kidney disease: Secondary | ICD-10-CM | POA: Diagnosis present

## 2025-01-04 DIAGNOSIS — G992 Myelopathy in diseases classified elsewhere: Secondary | ICD-10-CM | POA: Diagnosis present

## 2025-01-04 DIAGNOSIS — Z833 Family history of diabetes mellitus: Secondary | ICD-10-CM

## 2025-01-04 DIAGNOSIS — E1165 Type 2 diabetes mellitus with hyperglycemia: Secondary | ICD-10-CM | POA: Diagnosis not present

## 2025-01-04 DIAGNOSIS — W07XXXA Fall from chair, initial encounter: Secondary | ICD-10-CM | POA: Diagnosis present

## 2025-01-04 DIAGNOSIS — J069 Acute upper respiratory infection, unspecified: Secondary | ICD-10-CM | POA: Diagnosis present

## 2025-01-04 DIAGNOSIS — N182 Chronic kidney disease, stage 2 (mild): Secondary | ICD-10-CM | POA: Diagnosis present

## 2025-01-04 DIAGNOSIS — Z9181 History of falling: Secondary | ICD-10-CM

## 2025-01-04 DIAGNOSIS — I10 Essential (primary) hypertension: Secondary | ICD-10-CM | POA: Diagnosis present

## 2025-01-04 DIAGNOSIS — Z1152 Encounter for screening for COVID-19: Secondary | ICD-10-CM

## 2025-01-04 DIAGNOSIS — R531 Weakness: Secondary | ICD-10-CM

## 2025-01-04 DIAGNOSIS — Z91013 Allergy to seafood: Secondary | ICD-10-CM

## 2025-01-04 DIAGNOSIS — K5900 Constipation, unspecified: Secondary | ICD-10-CM | POA: Diagnosis present

## 2025-01-04 DIAGNOSIS — M488X2 Other specified spondylopathies, cervical region: Secondary | ICD-10-CM | POA: Diagnosis present

## 2025-01-04 DIAGNOSIS — Z825 Family history of asthma and other chronic lower respiratory diseases: Secondary | ICD-10-CM

## 2025-01-04 LAB — CBC WITH DIFFERENTIAL/PLATELET
Abs Immature Granulocytes: 0.02 K/uL (ref 0.00–0.07)
Basophils Absolute: 0.1 K/uL (ref 0.0–0.1)
Basophils Relative: 1 %
Eosinophils Absolute: 0.2 K/uL (ref 0.0–0.5)
Eosinophils Relative: 2 %
HCT: 37.6 % (ref 36.0–46.0)
Hemoglobin: 11.5 g/dL — ABNORMAL LOW (ref 12.0–15.0)
Immature Granulocytes: 0 %
Lymphocytes Relative: 23 %
Lymphs Abs: 2.1 K/uL (ref 0.7–4.0)
MCH: 30.2 pg (ref 26.0–34.0)
MCHC: 30.6 g/dL (ref 30.0–36.0)
MCV: 98.7 fL (ref 80.0–100.0)
Monocytes Absolute: 0.7 K/uL (ref 0.1–1.0)
Monocytes Relative: 8 %
Neutro Abs: 6 K/uL (ref 1.7–7.7)
Neutrophils Relative %: 66 %
Platelets: 306 K/uL (ref 150–400)
RBC: 3.81 MIL/uL — ABNORMAL LOW (ref 3.87–5.11)
RDW: 12.7 % (ref 11.5–15.5)
WBC: 9 K/uL (ref 4.0–10.5)
nRBC: 0 % (ref 0.0–0.2)

## 2025-01-04 LAB — RESP PANEL BY RT-PCR (RSV, FLU A&B, COVID)  RVPGX2
Influenza A by PCR: NEGATIVE
Influenza B by PCR: NEGATIVE
Resp Syncytial Virus by PCR: NEGATIVE
SARS Coronavirus 2 by RT PCR: NEGATIVE

## 2025-01-04 LAB — BASIC METABOLIC PANEL WITH GFR
Anion gap: 13 (ref 5–15)
BUN: 51 mg/dL — ABNORMAL HIGH (ref 6–20)
CO2: 21 mmol/L — ABNORMAL LOW (ref 22–32)
Calcium: 8.8 mg/dL — ABNORMAL LOW (ref 8.9–10.3)
Chloride: 109 mmol/L (ref 98–111)
Creatinine, Ser: 1.69 mg/dL — ABNORMAL HIGH (ref 0.44–1.00)
GFR, Estimated: 35 mL/min — ABNORMAL LOW
Glucose, Bld: 74 mg/dL (ref 70–99)
Potassium: 5.3 mmol/L — ABNORMAL HIGH (ref 3.5–5.1)
Sodium: 143 mmol/L (ref 135–145)

## 2025-01-04 LAB — CBG MONITORING, ED
Glucose-Capillary: 55 mg/dL — ABNORMAL LOW (ref 70–99)
Glucose-Capillary: 81 mg/dL (ref 70–99)
Glucose-Capillary: 77 mg/dL (ref 70–99)

## 2025-01-04 LAB — GLUCOSE, CAPILLARY
Glucose-Capillary: 110 mg/dL — ABNORMAL HIGH (ref 70–99)
Glucose-Capillary: 58 mg/dL — ABNORMAL LOW (ref 70–99)

## 2025-01-04 LAB — MAGNESIUM: Magnesium: 2 mg/dL (ref 1.7–2.4)

## 2025-01-04 MED ORDER — SODIUM CHLORIDE 0.9 % IV BOLUS
500.0000 mL | Freq: Once | INTRAVENOUS | Status: AC
  Administered 2025-01-04: 500 mL via INTRAVENOUS

## 2025-01-04 MED ORDER — SODIUM CHLORIDE 0.9 % IV BOLUS
1000.0000 mL | Freq: Once | INTRAVENOUS | Status: AC
  Administered 2025-01-04: 1000 mL via INTRAVENOUS

## 2025-01-04 MED ORDER — ACETAMINOPHEN 650 MG RE SUPP: 650.0000 mg | Freq: Four times a day (QID) | RECTAL | Status: DC | PRN

## 2025-01-04 MED ORDER — ONDANSETRON HCL 4 MG/2ML IJ SOLN: 4.0000 mg | Freq: Four times a day (QID) | INTRAMUSCULAR | Status: DC | PRN

## 2025-01-04 MED ORDER — ATORVASTATIN CALCIUM 80 MG PO TABS
80.0000 mg | ORAL_TABLET | Freq: Every day | ORAL | Status: DC
  Administered 2025-01-05 – 2025-01-12 (×8): 80 mg via ORAL
  Filled 2025-01-04 (×8): qty 1

## 2025-01-04 MED ORDER — ACETAMINOPHEN 500 MG PO TABS
1000.0000 mg | ORAL_TABLET | Freq: Once | ORAL | Status: AC
  Administered 2025-01-04: 1000 mg via ORAL
  Filled 2025-01-04: qty 2

## 2025-01-04 MED ORDER — ACETAMINOPHEN 325 MG PO TABS
650.0000 mg | ORAL_TABLET | Freq: Four times a day (QID) | ORAL | Status: DC | PRN
  Administered 2025-01-05 – 2025-01-07 (×4): 650 mg via ORAL
  Filled 2025-01-04 (×4): qty 2

## 2025-01-04 MED ORDER — INSULIN ASPART 100 UNIT/ML IJ SOLN
0.0000 [IU] | INTRAMUSCULAR | Status: DC
  Administered 2025-01-05 (×2): 2 [IU] via SUBCUTANEOUS
  Administered 2025-01-05: 3 [IU] via SUBCUTANEOUS
  Administered 2025-01-06 (×2): 2 [IU] via SUBCUTANEOUS
  Administered 2025-01-06 (×2): 1 [IU] via SUBCUTANEOUS
  Administered 2025-01-06: 3 [IU] via SUBCUTANEOUS
  Administered 2025-01-06: 1 [IU] via SUBCUTANEOUS
  Administered 2025-01-07: 7 [IU] via SUBCUTANEOUS
  Administered 2025-01-07: 1 [IU] via SUBCUTANEOUS
  Administered 2025-01-07: 5 [IU] via SUBCUTANEOUS
  Administered 2025-01-07 (×3): 2 [IU] via SUBCUTANEOUS
  Administered 2025-01-08 (×3): 3 [IU] via SUBCUTANEOUS
  Administered 2025-01-08 (×3): 7 [IU] via SUBCUTANEOUS
  Administered 2025-01-08 – 2025-01-09 (×2): 3 [IU] via SUBCUTANEOUS
  Administered 2025-01-09 – 2025-01-10 (×3): 5 [IU] via SUBCUTANEOUS
  Administered 2025-01-10: 3 [IU] via SUBCUTANEOUS
  Administered 2025-01-10: 7 [IU] via SUBCUTANEOUS
  Administered 2025-01-10: 5 [IU] via SUBCUTANEOUS
  Filled 2025-01-04: qty 2
  Filled 2025-01-04 (×2): qty 7
  Filled 2025-01-04: qty 5
  Filled 2025-01-04 (×2): qty 2
  Filled 2025-01-04: qty 5
  Filled 2025-01-04 (×3): qty 3
  Filled 2025-01-04: qty 5
  Filled 2025-01-04: qty 2
  Filled 2025-01-04: qty 7
  Filled 2025-01-04: qty 2
  Filled 2025-01-04: qty 3
  Filled 2025-01-04 (×2): qty 2
  Filled 2025-01-04: qty 3
  Filled 2025-01-04: qty 5
  Filled 2025-01-04: qty 1
  Filled 2025-01-04: qty 3
  Filled 2025-01-04: qty 5
  Filled 2025-01-04: qty 3
  Filled 2025-01-04: qty 7
  Filled 2025-01-04: qty 3
  Filled 2025-01-04 (×2): qty 1
  Filled 2025-01-04: qty 2
  Filled 2025-01-04: qty 3

## 2025-01-04 MED ORDER — LORAZEPAM 0.5 MG PO TABS
0.5000 mg | ORAL_TABLET | Freq: Every evening | ORAL | Status: DC | PRN
  Administered 2025-01-07: 0.5 mg via ORAL
  Filled 2025-01-04: qty 1

## 2025-01-04 MED ORDER — DOXYCYCLINE HYCLATE 100 MG PO TABS
100.0000 mg | ORAL_TABLET | Freq: Two times a day (BID) | ORAL | Status: AC
  Administered 2025-01-04 – 2025-01-12 (×16): 100 mg via ORAL
  Filled 2025-01-04 (×16): qty 1

## 2025-01-04 MED ORDER — ONDANSETRON HCL 4 MG PO TABS: 4.0000 mg | ORAL_TABLET | Freq: Four times a day (QID) | ORAL | Status: DC | PRN

## 2025-01-04 NOTE — ED Notes (Signed)
 [facility] at bedside. Pt spouse took home pt belongings other than cell phone which pt has in hand

## 2025-01-04 NOTE — ED Notes (Signed)
 Cbg 55. Pt was given juice.

## 2025-01-04 NOTE — ED Provider Notes (Signed)
 " [facility] AT [facility] Provider Note   CSN: [id number] Arrival date & time: 01/04/25  9046     Patient presents with: April Warren Warren   April Warren Warren is a 54 y.o. female.   Pt complains of increased weakness.  Patient reports that she had a fall 2 days ago and was seen and evaluated.  Patient states that today she slid out of her recliner and could not get up on her own.  Patient's husband reports that he could not lift the patient up off the floor.  Patient complains of being weak all over.  Patient reports that she saw her primary care physician on 1/5.  Patient reports she was told that she might have the flu.  Patient was treated with doxycycline  and providers notes report that she was diagnosed with bronchitis and sinusitis.  Patient reports she has progressively become weaker to the point that she cannot stand and walk.  Patient reports that she has had left leg weakness since September.  Patient reports that she can barely move any part of her left leg.  Patient states that last month she began having left arm weakness.  Patient has a past medical history of diabetes high blood pressure retinopathy, hyperlipidemia and neuropathy.Patient states when she fell today she did not strike anything patient states that she was trying to stand and was sliding out of the chair.  She reports her husband helped her to the ground she did not injure anything.  When she fell 2 days ago she was seen and x-rayed.  Patient did have a CT of her head at that time which did not show any acute abnormalities.  The history is provided by the patient. No language interpreter was used.  Fall This is a new problem. The current episode started less than 1 hour ago. The problem occurs constantly. The problem has been gradually worsening. Pertinent negatives include no chest pain. Nothing aggravates the symptoms. Nothing relieves the symptoms.  April Warren Warren got to go talk to her and told her were going  to get her admitted so she will stop coming     Prior to Admission medications  Medication Sig Start Date End Date Taking? Authorizing Provider  albuterol  (PROAIR  HFA) 108 (90 Base) MCG/ACT inhaler USE 2 INHALATIONS EVERY 6 HOURS AS NEEDED FOR WHEEZING OR SHORTNESS OF BREATH Patient taking differently: 2 puffs every 6 (six) hours as needed for wheezing or shortness of breath. 09/23/21   [name] LABOR, MD  aspirin EC 81 MG tablet Take 81 mg by mouth daily. Swallow whole.    [provider]  atorvastatin  (LIPITOR ) 80 MG tablet TAKE 1 TABLET DAILY 05/03/24   [name] LABOR, MD  Blood Glucose Monitoring Suppl (FREESTYLE FREEDOM LITE) w/Device KIT Check blood sugar twice daily 10/14/17   [name] LABOR, MD  cetirizine  (ZYRTEC ) 10 MG tablet Take 1 tablet (10 mg total) by mouth daily as needed for allergies or rhinitis. 12/20/24   [name] LABOR, MD  doxycycline  (VIBRAMYCIN ) 100 MG capsule Take 1 capsule (100 mg total) by mouth 2 (two) times daily. 01/02/25   Copland, Harlene BROCKS, MD  ezetimibe  (ZETIA ) 10 MG tablet Take 1 tablet (10 mg total) by mouth daily. 12/30/24   [name] LABOR, MD  fenofibrate  micronized (LOFIBRA) 134 MG capsule Take 1 capsule (134 mg total) by mouth daily before breakfast. 09/05/24   [name] LABOR, MD  gabapentin  (NEURONTIN ) 300 MG capsule TAKE 1 CAPSULE TWICE A  DAY AND 2 CAPSULES AT BEDTIME 05/03/24   [name] LABOR, MD  glipiZIDE  (GLUCOTROL ) 10 MG tablet Take 0.5 tablets (5 mg total) by mouth 2 (two) times daily before a meal. Patient taking differently: Take 10 mg by mouth daily before breakfast. 01/26/24   [name] LABOR, MD  glucose blood test strip Use as instructed 02/12/18   [name] LABOR, MD  ibuprofen (ADVIL,MOTRIN) 200 MG tablet Take 400 mg by mouth every 8 (eight) hours as needed.    [provider]  JANUVIA  100 MG tablet TAKE 1 TABLET DAILY 11/07/24   [name] LABOR, MD  Lancets (FREESTYLE) lancets Check blood sugar twice daily 11/17/17   [name] LABOR, MD  lisinopril  (ZESTRIL ) 20 MG tablet Take 1 tablet (20 mg total) by mouth 2 (two) times daily. 12/05/24   [name] LABOR, MD  LORazepam  (ATIVAN ) 1 MG tablet TAKE 1/2 TABLET BY MOUTH TWICE DAILY AS NEEDED FOR ANXIETY 11/15/24   [name] LABOR, MD  Magnesium Oxide 400 MG CAPS Take 1 capsule by mouth daily. Pt is taking 825mg  capsule every other day. Patient taking differently: Take 1 capsule by mouth daily. Pt is taking 700mg  twice a day    [provider]  meloxicam  (MOBIC ) 7.5 MG tablet Take 1-2 tablets (7.5-15 mg total) by mouth daily. 09/22/24   [name] LABOR, MD  metFORMIN  (GLUCOPHAGE -XR) 750 MG 24 hr tablet Take 1 tablet (750 mg total) by mouth daily with breakfast. 06/13/24   [name] LABOR, MD  metoprolol  tartrate (LOPRESSOR ) 50 MG tablet TAKE 1 TABLET TWICE A DAY 07/26/24   [name] LABOR, MD  Misc Natural Products (BEET ROOT PO) Take 1 tablet by mouth daily.    [provider]  montelukast  (SINGULAIR ) 10 MG tablet TAKE 1 TABLET AT BEDTIME 02/09/24   [name] LABOR, MD  Potassium Bicarbonate 99 MG CAPS Take 2 capsules by mouth daily.    [provider]  tiZANidine  (ZANAFLEX ) 2 MG tablet Take 0.5-2 tablets (1-4 mg total) by mouth 2 (two) times daily between meals as needed for muscle spasms. 09/22/24   [name] LABOR, MD  triamterene -hydrochlorothiazide  (MAXZIDE-25) 37.5-25 MG tablet TAKE 1 TABLET DAILY 07/26/24   [name] LABOR, MD  Vitamin D , Ergocalciferol , (DRISDOL ) 1.25 MG (50000 UNIT) CAPS capsule TAKE 1 CAPSULE EVERY 7 DAYS 12/11/23   Webb, Padonda B, FNP    Allergies: Patient has no known allergies.    Review of Systems  Cardiovascular:  Negative for chest pain.  All other systems reviewed and are negative.   Updated Vital Signs BP 95/69   Pulse (!) 52   Temp 98.2 F (36.8 C) (Oral)   Resp 18   Ht 5' 9 (1.753 m)   Wt 124 kg   LMP 10/28/2014   SpO2 97%   BMI 40.37 kg/m   Physical Exam Vitals and nursing note reviewed.   Constitutional:      Appearance: She is well-developed.  HENT:     Head: Normocephalic.  Cardiovascular:     Rate and Rhythm: Normal rate.  Pulmonary:     Effort: Pulmonary effort is normal.  Abdominal:     General: There is no distension.  Musculoskeletal:     Cervical back: Normal range of motion.     Comments: Decreased range of motion left arm and left leg.    Skin:    General: Skin is warm.  Neurological:     General: No focal deficit present.  Mental Status: She is alert and oriented to person, place, and time.     (all labs ordered are listed, but only abnormal results are displayed) Labs Reviewed  CBC WITH DIFFERENTIAL/PLATELET - Abnormal; Notable for the following components:      Result Value   RBC 3.81 (*)    Hemoglobin 11.5 (*)    All other components within normal limits  BASIC METABOLIC PANEL WITH GFR - Abnormal; Notable for the following components:   Potassium 5.3 (*)    CO2 21 (*)    BUN 51 (*)    Creatinine, Ser 1.69 (*)    Calcium  8.8 (*)    GFR, Estimated 35 (*)    All other components within normal limits  RESP PANEL BY RT-PCR (RSV, FLU A&B, COVID)  RVPGX2  MAGNESIUM    EKG: None  Radiology: DG Knee Complete 4 Views Left Result Date: 01/02/2025 EXAM: 4 VIEW(S) XRAY OF THE LEFT KNEE 01/02/2025 02:40:00 PM COMPARISON: None available. CLINICAL HISTORY: fall FINDINGS: BONES AND JOINTS: No acute fracture. No malalignment. No joint effusion. Enthesophytes at the quadriceps tendon insertion and patellar tendon origin/insertion. Mild patellofemoral osteoarthritis. SOFT TISSUES: The soft tissues are unremarkable. IMPRESSION: 1. No evidence of acute traumatic injury. Electronically signed by: Rockey Kilts MD 01/02/2025 03:28 PM EST RP Workstation: [id number]   CT Cervical Spine Wo Contrast Result Date: 01/02/2025 EXAM: CT CERVICAL SPINE WITHOUT CONTRAST 01/02/2025 02:54:49 PM TECHNIQUE: CT of the cervical spine was performed without the administration of  intravenous contrast. Multiplanar reformatted images are provided for review. Automated exposure control, iterative reconstruction, and/or weight based adjustment of the mA/kV was utilized to reduce the radiation dose to as low as reasonably achievable. COMPARISON: None available. CLINICAL HISTORY: Polytrauma, blunt. Slipped backwards going up a ramp and fell, hitting the head, neck, and left shoulder with pain to these areas. FINDINGS: BONES AND ALIGNMENT: No acute fracture or traumatic malalignment. 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 Broad posterior disc osteophyte complexes and/or ossification of the posterior longitudinal ligament at T2-T3 resulting in moderate spinal stenosis. SOFT TISSUES: No prevertebral soft tissue swelling. IMPRESSION: 1. No acute cervical spine fracture or traumatic malalignment. 2. 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 Electronically signed by: Dasie Hamburg MD 01/02/2025 03:25 PM EST RP Workstation: [id number]   CT Head Wo Contrast Result Date: 01/02/2025 EXAM: CT HEAD WITHOUT CONTRAST 01/02/2025 02:54:49 PM TECHNIQUE: CT of the head was performed without the administration of intravenous contrast. Automated exposure control, iterative reconstruction, and/or weight based adjustment of the mA/kV was utilized to reduce the radiation dose to as low as reasonably achievable. COMPARISON: None available. CLINICAL HISTORY: Polytrauma, blunt. Slipped backwards going up a ramp and fell, hitting the head, neck, and left shoulder with pain to these areas. FINDINGS: BRAIN AND VENTRICLES: There is no evidence of an acute infarct, intracranial hemorrhage, mass, midline shift, hydrocephalus, or extra-axial fluid collection. Cerebral volume is normal. Calcified atherosclerosis at the skull base.  ORBITS: No acute abnormality. SINUSES: Mild mucosal thickening in the ethmoid sinuses. Clear mastoid air cells. SOFT TISSUES AND SKULL: No acute soft tissue abnormality. No skull fracture. IMPRESSION: 1. No acute intracranial abnormality. Electronically signed by: Dasie Hamburg MD 01/02/2025 03:19 PM EST RP Workstation: [id number]     Procedures   Medications Ordered in the ED  sodium chloride  0.9 % bolus 1,000 mL (1,000 mLs  Intravenous Bolus 01/04/25 1046)                                    Medical Decision Making Patient reports that she slid out of the chair today and could not stand up.  Patient complains of increased weakness.  Patient reports that she began having weakness in September to her left leg.  Patient reports prior to that she had some hip pain on the left side.  Patient states for over a month she has been experiencing weakness in her left arm and her left leg.  Patient states 2 days ago she was diagnosed with influenza.  Patient is currently being treated with doxycycline  for sinusitis.  Patient states she fell 2 days ago and was seen in the emergency department and had x-rays.  Patient states today that she did not injure herself that she slid out of the chair and that her husband lowered her to the floor.  Amount and/or Complexity of Data Reviewed Independent Historian:     Details: Husband states that he cannot care for patient in the shape at home he cannot lift her off the ground. Labs: ordered.    Details: Labs ordered reviewed and interpreted.  Potassium is 5.3 creatinine is 1.69 GFR is 35.  Influenza COVID and RSV are negative Radiology: ordered and independent interpretation performed. Decision-making details documented in ED Course.    Details: MRI Brain, cspine and tspine ordered  Discussion of management or test interpretation with external provider(s): Hospitalist consulted for admission. Dr. Pearlean requested Neurosurgery consult given patient's recent CT scan of  her cervical spine which showed spinal stenosis.  I spoke with Dr. Joshua neurosurgeon on-call.  He advised obtaining MRI of areas of concern.  He advised if concerns on MRI patient could be admitted to [facility] and he can consult.  Risk Decision regarding hospitalization.        Final diagnoses:  Dehydration  Weakness  Fall, initial encounter  Acute kidney injury    ED Discharge Orders     None          Fl[place] Sonny MARLA DEVONNA 01/04/25 1916    Charlyn Sora, MD 01/05/25 1447  "

## 2025-01-04 NOTE — ED Notes (Addendum)
 Called 3 W at [facility] informed staff that carelink is here,, let RN know to message/call me with any questions -[facility] in [facility]

## 2025-01-04 NOTE — Progress Notes (Signed)
 Hypoglycemic Event  CBG: 58  Treatment: 4 oz juice/soda  Symptoms: None  Follow-up CBG: Time:2335 CBG Result:110  Possible Reasons for Event: Inadequate meal intake  Comments/MD notified: Dr. Charlton made aware    April Warren

## 2025-01-04 NOTE — ED Notes (Signed)
 Pt is refusing in and out cath

## 2025-01-04 NOTE — ED Notes (Signed)
 Patient transported to MRI

## 2025-01-04 NOTE — ED Notes (Addendum)
 Pt assisted on bedpan, reports she needs to have BM- no output noted, just gas per pt

## 2025-01-04 NOTE — H&P (Signed)
 " History and Physical    April Warren FMW:[id number] DOB: November 13, 1971 DOA: 01/04/2025  PCP: [name] LABOR, MD   Patient coming from: Home  I have personally briefly reviewed patient's old medical records in [facility]  Chief Complaint: Fall  HPI: April Warren is a 54 y.o. female with medical history significant for diabetic retinopathy, hypertension.  Patient presented to the ED with complaints of falls, worsening weakness over the past 2 days.  She was seen by her outpatient provider, presumed to have the flu, diagnosed with bronchitis and sinusitis, started on a course of doxycycline  also presented to the ED later that afternoon after she got home and subsequently fell.  She has had ongoing left lower extremity weakness since September 2025, and onset of left upper extremity weakness over the past months.  She reports fluctuating symptoms-times worse, sometimes better, of both upper and lower extremities since onset.  No problems with her bowel or bladder. She reports 4 watery bowel movements today.  No abdominal pain.  No vomiting.  Her upper respiratory tract tones are improving.  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  ED Course: Temperature 98.2.  Heart rate 50s to 60s.  Respirate rate 15-20.  Blood pressure systolic 92-136.  O2 sats greater 94% on room air. COVID and flu negative. EDP talked to neurosurgery, recommended MRI C-spine, admit to [facility].  Review of Systems: As per HPI all other systems reviewed and negative.  Past Medical History:  Diagnosis Date   Allergy    Asthma    Certain time of the year   Chicken pox 54 yrs old   Diabetes mellitus 08/12/2012   type 2- dr alvia diagnosed   Diabetic retinopathy (HCC)    Dysmenorrhea 08/20/2012   HTN (hypertension) 08/20/2012   Hyperlipidemia, mixed 08/28/2013   Neuromuscular disorder  (HCC)    Not sure of date   Neuropathy 02/16/2017   Obesity    Obesity, unspecified 08/28/2013   Other and unspecified hyperlipidemia 08/28/2013   Preventative health care 08/20/2012   Sinusitis 11/14/2013   Sinusitis, acute 05/02/2016    Past Surgical History:  Procedure Laterality Date   BREAST BIOPSY Right 01/29/2024   US  RT BREAST BX W LOC DEV 1ST LESION IMG BX SPEC US  GUIDE 01/29/2024 [facility] MAMMOGRAPHY   EYE SURGERY     Laser surgery for bleeds   REFRACTIVE SURGERY  08/12/2012   right, left on 09/16/12     reports that she has never smoked. She has never used smokeless tobacco. She reports current alcohol use. She reports that she does not use drugs.  Allergies[1]  Family History  Problem Relation Age of Onset   Cancer Mother 70       breast   Diabetes Mother        type 2   Hypertension Mother    Hypertension Father    Hyperlipidemia Father    COPD Father    Arthritis Father    Asthma Father    Hypertension Maternal Grandmother    Hyperlipidemia Maternal Grandmother    COPD Maternal Grandmother    Asthma Maternal Grandmother    Hypertension Maternal Grandfather    Heart attack Maternal Grandfather    Stroke Maternal Grandfather    Gout Brother    Cancer Brother        lung   Heart disease Paternal Grandfather  Alcohol abuse Paternal Grandfather     Prior to Admission medications  Medication Sig Start Date End Date Taking? Authorizing Provider  albuterol  (PROAIR  HFA) 108 (90 Base) MCG/ACT inhaler USE 2 INHALATIONS EVERY 6 HOURS AS NEEDED FOR WHEEZING OR SHORTNESS OF BREATH Patient taking differently: 2 puffs every 6 (six) hours as needed for wheezing or shortness of breath. 09/23/21   [name] LABOR, MD  aspirin EC 81 MG tablet Take 81 mg by mouth daily. Swallow whole.    [provider]  atorvastatin  (LIPITOR) 80 MG tablet TAKE 1 TABLET DAILY 05/03/24   [name] LABOR, MD  Blood Glucose Monitoring Suppl (FREESTYLE FREEDOM LITE) w/Device KIT Check  blood sugar twice daily 10/14/17   [name] LABOR, MD  cetirizine  (ZYRTEC ) 10 MG tablet Take 1 tablet (10 mg total) by mouth daily as needed for allergies or rhinitis. 12/20/24   [name] LABOR, MD  doxycycline  (VIBRAMYCIN ) 100 MG capsule Take 1 capsule (100 mg total) by mouth 2 (two) times daily. 01/02/25   Copland, Harlene BROCKS, MD  ezetimibe  (ZETIA ) 10 MG tablet Take 1 tablet (10 mg total) by mouth daily. 12/30/24   [name] LABOR, MD  fenofibrate  micronized (LOFIBRA) 134 MG capsule Take 1 capsule (134 mg total) by mouth daily before breakfast. 09/05/24   [name] LABOR, MD  gabapentin  (NEURONTIN ) 300 MG capsule TAKE 1 CAPSULE TWICE A DAY AND 2 CAPSULES AT BEDTIME 05/03/24   [name] LABOR, MD  glipiZIDE  (GLUCOTROL ) 10 MG tablet Take 0.5 tablets (5 mg total) by mouth 2 (two) times daily before a meal. Patient taking differently: Take 10 mg by mouth daily before breakfast. 01/26/24   [name] LABOR, MD  glucose blood test strip Use as instructed 02/12/18   [name] LABOR, MD  ibuprofen (ADVIL,MOTRIN) 200 MG tablet Take 400 mg by mouth every 8 (eight) hours as needed.    [provider]  JANUVIA  100 MG tablet TAKE 1 TABLET DAILY 11/07/24   [name] LABOR, MD  Lancets (FREESTYLE) lancets Check blood sugar twice daily 11/17/17   [name] LABOR, MD  lisinopril  (ZESTRIL ) 20 MG tablet Take 1 tablet (20 mg total) by mouth 2 (two) times daily. 12/05/24   [name] LABOR, MD  LORazepam  (ATIVAN ) 1 MG tablet TAKE 1/2 TABLET BY MOUTH TWICE DAILY AS NEEDED FOR ANXIETY 11/15/24   [name] LABOR, MD  Magnesium Oxide 400 MG CAPS Take 1 capsule by mouth daily. Pt is taking 825mg  capsule every other day. Patient taking differently: Take 1 capsule by mouth daily. Pt is taking 700mg  twice a day    [provider]  meloxicam  (MOBIC ) 7.5 MG tablet Take 1-2 tablets (7.5-15 mg total) by mouth daily. 09/22/24   [name] LABOR, MD  metFORMIN  (GLUCOPHAGE -XR) 750 MG 24 hr tablet Take 1 tablet (750 mg total) by  mouth daily with breakfast. 06/13/24   [name] LABOR, MD  metoprolol  tartrate (LOPRESSOR ) 50 MG tablet TAKE 1 TABLET TWICE A DAY 07/26/24   [name] LABOR, MD  Misc Natural Products (BEET ROOT PO) Take 1 tablet by mouth daily.    [provider]  montelukast  (SINGULAIR ) 10 MG tablet TAKE 1 TABLET AT BEDTIME 02/09/24   [name] LABOR, MD  Potassium Bicarbonate 99 MG CAPS Take 2 capsules by mouth daily.    [provider]  tiZANidine  (ZANAFLEX ) 2 MG tablet Take 0.5-2 tablets (1-4 mg total) by mouth 2 (two) times daily between meals as needed for  muscle spasms. 09/22/24   [name] LABOR, MD  triamterene -hydrochlorothiazide  (MAXZIDE-25) 37.5-25 MG tablet TAKE 1 TABLET DAILY 07/26/24   [name] LABOR, MD  Vitamin D , Ergocalciferol , (DRISDOL ) 1.25 MG (50000 UNIT) CAPS capsule TAKE 1 CAPSULE EVERY 7 DAYS 12/11/23   Douglass Kenney NOVAK, FNP    Physical Exam: Vitals:   01/04/25 1047 01/04/25 1145 01/04/25 1300 01/04/25 1515  BP: (!) 92/56 95/69 136/66 115/68  Pulse: (!) 55 (!) 52 (!) 59 (!) 53  Resp: 18 18 20 15   Temp: 98.2 F (36.8 C)     TempSrc: Oral     SpO2: 96% 97% 96% 94%  Weight:      Height:        Constitutional: NAD, calm, comfortable Vitals:   01/04/25 1047 01/04/25 1145 01/04/25 1300 01/04/25 1515  BP: (!) 92/56 95/69 136/66 115/68  Pulse: (!) 55 (!) 52 (!) 59 (!) 53  Resp: 18 18 20 15   Temp: 98.2 F (36.8 C)     TempSrc: Oral     SpO2: 96% 97% 96% 94%  Weight:      Height:       Eyes: PERRL, lids and conjunctivae normal ENMT: Mucous membranes are mildly dry Neck: normal, supple, no masses, no thyromegaly Respiratory: clear to auscultation bilaterally, no wheezing, no crackles. Normal respiratory effort. No accessory muscle use.  Cardiovascular: Regular rate and rhythm, no murmurs / rubs / gallops. No extremity edema.  Abdomen: Obese, no tenderness, no masses palpated. No hepatosplenomegaly.   Musculoskeletal: no clubbing / cyanosis. No joint deformity  upper and lower extremities.  Skin: no rashes, lesions, ulcers. No induration Neurologic: No facial asymmetry, speech fluent, grip strength appreciably slightly weaker on the left compared to right, 3/5 strength to left lower extremity, she reports treating symptoms, earlier today she was able to lift it above gravity.  Full 5/5 strength to right lower extremity. Psychiatric: Normal judgment and insight. Alert and oriented x 3. Normal mood.   Labs on Admission: I have personally reviewed following labs and imaging studies  CBC: Recent Labs  Lab 12/29/24 1406 01/04/25 1114  WBC 7.4 9.0  NEUTROABS 5.0 6.0  HGB 12.2 11.5*  HCT 37.4 37.6  MCV 94.9 98.7  PLT 245 306   Basic Metabolic Panel: Recent Labs  Lab 12/29/24 1406 01/04/25 1114  NA 137 143  K 5.2* 5.3*  CL 102 109  CO2 22 21*  GLUCOSE 191* 74  BUN 21* 51*  CREATININE 1.16* 1.69*  CALCIUM  9.6 8.8*  MG  --  2.0   GFR: Estimated Creatinine Clearance: 53.6 mL/min (A) (by C-G formula based on SCr of 1.69 mg/dL (H)). Liver Function Tests: Recent Labs  Lab 12/29/24 1406  AST 31  ALT 25  ALKPHOS 67  BILITOT 0.7  PROT 7.4  ALBUMIN 4.3   Cardiac Enzymes: Recent Labs  Lab 12/29/24 1406  CKTOTAL 101   Urine analysis:    Component Value Date/Time   COLORURINE YELLOW 03/04/2011 2244   APPEARANCEUR CLEAR 03/04/2011 2244   LABSPEC 1.038 (H) 03/04/2011 2244   PHURINE 5.5 03/04/2011 2244   GLUCOSEU >1000 (A) 03/04/2011 2244   HGBUR TRACE (A) 03/04/2011 2244   BILIRUBINUR NEGATIVE 03/04/2011 2244   KETONESUR NEGATIVE 03/04/2011 2244   PROTEINUR NEGATIVE 03/04/2011 2244   UROBILINOGEN 0.2 03/04/2011 2244   NITRITE NEGATIVE 03/04/2011 2244   LEUKOCYTESUR NEGATIVE 03/04/2011 2244    Radiological Exams on Admission: No results found.  EKG: Independently reviewed.  Sinus rhythm, rate 54,  QTc 445.  No significant change from prior.  Assessment/Plan Principal Problem:   Left-sided weakness Active Problems:    Type 2 diabetes mellitus in patient with obesity (HCC)   HTN (hypertension)   Fall   Assessment and Plan:  Left-sided weakness, falls- LUE weakness of 1 month, LLE weakness since last September.  No bowel or urinary problems.  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 - EDP talk to neurosurgeon- Dr.   Recommended getting MRI, admit to [facility] - [place] brain, C-spine, thoracic spine pending.  - PT eval when able  Recent acute bronchitis-congestion, cough improving.  Lungs clear on exam.  O2 sat greater 94% on room air.  Not dyspneic. - Complete course of doxycycline   Diarrhea- 4 episodes, onset today.  Afebrile, no leukocytosis.  Abdomen benign.  No vomiting.  Started on doxycycline - possible etiology. - Monitor for now, stool studies may be needed if persistent. - 1.5 L bolus given, cont LR 75cc/hr x 12hrs  Uncontrolled diabetes mellitus with hypoglycemia CBG of 55 > 81-likely from poor oral intake today.  A1c 09/22/2024- 8.1. - SSI- S -Hold glipizide , Januvia , metformin  for now  Hypertension-blood pressure soft, 90s to 130s. - Hold metoprolol , lisinopril , triamterene -HCTZ.  CKD 3 A/B- creatinine 1.6, close to baseline 1.1-1.5.   DVT prophylaxis: SCDs for now pending neurosurgery evaluation Code Status: Full code Family Communication: None at bedside Disposition Plan: ~ 2 days Consults called: Neurosurgery Admission status: Inpatient, telemetry   Author: Tully FORBES Carwin, MD 01/04/2025 6:31 PM  For on call review [email or web address].      [1] No Known Allergies  "

## 2025-01-04 NOTE — Telephone Encounter (Signed)
 Copied from CRM #[id number]. Topic: General - Other >> Jan 04, 2025 10:40 AM Burnard DEL wrote: Reason for CRM: Patients husband called in to let provider know that she is currently in the ER with left side numbness not able to move left arm or leg.

## 2025-01-04 NOTE — ED Triage Notes (Signed)
 Pt bib rcems with complaints of a fall that happened this morning. Pt states that she slid out of the recliner. Pt is flu positive and has been weak. Pt also states that she has not urinated in the last 24 hours. Left arm and leg are weak, but have been since September. Cbg was 102 in the truck.

## 2025-01-04 NOTE — Progress Notes (Signed)
 Patient arrived from [facility] to [facility] via [facility]. Pt alert and oriented and with continued left sided weakness. Tele placed and vitals completed.

## 2025-01-05 ENCOUNTER — Other Ambulatory Visit: Payer: Self-pay | Admitting: Neurological Surgery

## 2025-01-05 ENCOUNTER — Encounter (HOSPITAL_COMMUNITY): Payer: Self-pay | Admitting: Internal Medicine

## 2025-01-05 DIAGNOSIS — K5901 Slow transit constipation: Secondary | ICD-10-CM | POA: Diagnosis not present

## 2025-01-05 DIAGNOSIS — G8252 Quadriplegia, C1-C4 incomplete: Secondary | ICD-10-CM | POA: Diagnosis present

## 2025-01-05 DIAGNOSIS — Z91013 Allergy to seafood: Secondary | ICD-10-CM | POA: Diagnosis not present

## 2025-01-05 DIAGNOSIS — E1122 Type 2 diabetes mellitus with diabetic chronic kidney disease: Secondary | ICD-10-CM | POA: Diagnosis present

## 2025-01-05 DIAGNOSIS — Z825 Family history of asthma and other chronic lower respiratory diseases: Secondary | ICD-10-CM | POA: Diagnosis not present

## 2025-01-05 DIAGNOSIS — N179 Acute kidney failure, unspecified: Secondary | ICD-10-CM | POA: Diagnosis present

## 2025-01-05 DIAGNOSIS — K592 Neurogenic bowel, not elsewhere classified: Secondary | ICD-10-CM | POA: Diagnosis present

## 2025-01-05 DIAGNOSIS — R531 Weakness: Secondary | ICD-10-CM | POA: Diagnosis not present

## 2025-01-05 DIAGNOSIS — N1831 Chronic kidney disease, stage 3a: Secondary | ICD-10-CM | POA: Diagnosis not present

## 2025-01-05 DIAGNOSIS — E1165 Type 2 diabetes mellitus with hyperglycemia: Secondary | ICD-10-CM | POA: Diagnosis not present

## 2025-01-05 DIAGNOSIS — G992 Myelopathy in diseases classified elsewhere: Secondary | ICD-10-CM | POA: Diagnosis present

## 2025-01-05 DIAGNOSIS — E782 Mixed hyperlipidemia: Secondary | ICD-10-CM | POA: Diagnosis present

## 2025-01-05 DIAGNOSIS — E860 Dehydration: Secondary | ICD-10-CM | POA: Diagnosis present

## 2025-01-05 DIAGNOSIS — M4802 Spinal stenosis, cervical region: Secondary | ICD-10-CM | POA: Diagnosis present

## 2025-01-05 DIAGNOSIS — F419 Anxiety disorder, unspecified: Secondary | ICD-10-CM | POA: Diagnosis not present

## 2025-01-05 DIAGNOSIS — M488X2 Other specified spondylopathies, cervical region: Secondary | ICD-10-CM | POA: Diagnosis present

## 2025-01-05 DIAGNOSIS — R739 Hyperglycemia, unspecified: Secondary | ICD-10-CM | POA: Diagnosis not present

## 2025-01-05 DIAGNOSIS — J45909 Unspecified asthma, uncomplicated: Secondary | ICD-10-CM | POA: Diagnosis present

## 2025-01-05 DIAGNOSIS — W07XXXA Fall from chair, initial encounter: Secondary | ICD-10-CM | POA: Diagnosis present

## 2025-01-05 DIAGNOSIS — Z1152 Encounter for screening for COVID-19: Secondary | ICD-10-CM | POA: Diagnosis not present

## 2025-01-05 DIAGNOSIS — Z7982 Long term (current) use of aspirin: Secondary | ICD-10-CM | POA: Diagnosis not present

## 2025-01-05 DIAGNOSIS — Z7984 Long term (current) use of oral hypoglycemic drugs: Secondary | ICD-10-CM | POA: Diagnosis not present

## 2025-01-05 DIAGNOSIS — E66813 Obesity, class 3: Secondary | ICD-10-CM | POA: Diagnosis present

## 2025-01-05 DIAGNOSIS — Z8249 Family history of ischemic heart disease and other diseases of the circulatory system: Secondary | ICD-10-CM | POA: Diagnosis not present

## 2025-01-05 DIAGNOSIS — N319 Neuromuscular dysfunction of bladder, unspecified: Secondary | ICD-10-CM | POA: Diagnosis not present

## 2025-01-05 DIAGNOSIS — Z833 Family history of diabetes mellitus: Secondary | ICD-10-CM | POA: Diagnosis not present

## 2025-01-05 DIAGNOSIS — M4801 Spinal stenosis, occipito-atlanto-axial region: Secondary | ICD-10-CM | POA: Diagnosis present

## 2025-01-05 DIAGNOSIS — G8250 Quadriplegia, unspecified: Secondary | ICD-10-CM | POA: Diagnosis not present

## 2025-01-05 DIAGNOSIS — Y92008 Other place in unspecified non-institutional (private) residence as the place of occurrence of the external cause: Secondary | ICD-10-CM | POA: Diagnosis not present

## 2025-01-05 DIAGNOSIS — E11649 Type 2 diabetes mellitus with hypoglycemia without coma: Secondary | ICD-10-CM | POA: Diagnosis present

## 2025-01-05 DIAGNOSIS — I129 Hypertensive chronic kidney disease with stage 1 through stage 4 chronic kidney disease, or unspecified chronic kidney disease: Secondary | ICD-10-CM | POA: Diagnosis present

## 2025-01-05 DIAGNOSIS — Z6841 Body Mass Index (BMI) 40.0 and over, adult: Secondary | ICD-10-CM | POA: Diagnosis not present

## 2025-01-05 DIAGNOSIS — I10 Essential (primary) hypertension: Secondary | ICD-10-CM | POA: Diagnosis not present

## 2025-01-05 DIAGNOSIS — F4321 Adjustment disorder with depressed mood: Secondary | ICD-10-CM | POA: Diagnosis not present

## 2025-01-05 DIAGNOSIS — E11319 Type 2 diabetes mellitus with unspecified diabetic retinopathy without macular edema: Secondary | ICD-10-CM | POA: Diagnosis present

## 2025-01-05 LAB — GLUCOSE, CAPILLARY
Glucose-Capillary: 71 mg/dL (ref 70–99)
Glucose-Capillary: 83 mg/dL (ref 70–99)
Glucose-Capillary: 155 mg/dL — ABNORMAL HIGH (ref 70–99)
Glucose-Capillary: 155 mg/dL — ABNORMAL HIGH (ref 70–99)
Glucose-Capillary: 206 mg/dL — ABNORMAL HIGH (ref 70–99)

## 2025-01-05 LAB — BASIC METABOLIC PANEL WITH GFR
Anion gap: 8 (ref 5–15)
BUN: 43 mg/dL — ABNORMAL HIGH (ref 6–20)
CO2: 24 mmol/L (ref 22–32)
Calcium: 8.5 mg/dL — ABNORMAL LOW (ref 8.9–10.3)
Chloride: 107 mmol/L (ref 98–111)
Creatinine, Ser: 1.54 mg/dL — ABNORMAL HIGH (ref 0.44–1.00)
GFR, Estimated: 40 mL/min — ABNORMAL LOW
Glucose, Bld: 82 mg/dL (ref 70–99)
Potassium: 4.6 mmol/L (ref 3.5–5.1)
Sodium: 139 mmol/L (ref 135–145)

## 2025-01-05 LAB — CBC
HCT: 34.8 % — ABNORMAL LOW (ref 36.0–46.0)
Hemoglobin: 11.1 g/dL — ABNORMAL LOW (ref 12.0–15.0)
MCH: 31.1 pg (ref 26.0–34.0)
MCHC: 31.9 g/dL (ref 30.0–36.0)
MCV: 97.5 fL (ref 80.0–100.0)
Platelets: 283 K/uL (ref 150–400)
RBC: 3.57 MIL/uL — ABNORMAL LOW (ref 3.87–5.11)
RDW: 12.7 % (ref 11.5–15.5)
WBC: 9.1 K/uL (ref 4.0–10.5)
nRBC: 0 % (ref 0.0–0.2)

## 2025-01-05 LAB — HEMOGLOBIN A1C
Hgb A1c MFr Bld: 7.1 % — ABNORMAL HIGH (ref 4.8–5.6)
Mean Plasma Glucose: 157.07 mg/dL

## 2025-01-05 LAB — HIV ANTIBODY (ROUTINE TESTING W REFLEX): HIV Screen 4th Generation wRfx: NONREACTIVE

## 2025-01-05 MED ORDER — CHLORHEXIDINE GLUCONATE CLOTH 2 % EX PADS
6.0000 | MEDICATED_PAD | Freq: Every day | CUTANEOUS | Status: DC
  Administered 2025-01-05 – 2025-01-12 (×8): 6 via TOPICAL

## 2025-01-05 MED ORDER — ORAL CARE MOUTH RINSE: 15.0000 mL | OROMUCOSAL | Status: DC | PRN

## 2025-01-05 MED ORDER — ENOXAPARIN SODIUM 40 MG/0.4ML IJ SOSY
40.0000 mg | PREFILLED_SYRINGE | INTRAMUSCULAR | Status: DC
  Administered 2025-01-05 – 2025-01-06 (×2): 40 mg via SUBCUTANEOUS
  Filled 2025-01-05 (×2): qty 0.4

## 2025-01-05 NOTE — Evaluation (Signed)
 Physical Therapy Evaluation Patient Details Name: April Warren MRN: [id number] DOB: 1971-09-14 Today's Date: 01/05/2025  History of Present Illness  54 year old female presented to ED 01/04/25 s/p falls and increasing weakness x 2 days.  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 Neurosurgery consult with plans for C@-7 posterior laminectomy next week after medical tune-up (due to current respiratory infection and incr creatinine). PMH significant for diabetic retinopathy, hypertension.  Clinical Impression   Pt admitted secondary to problem above with deficits below. PTA patient had recent falls PRIOR to the fall on her ramp that resulted in Lt sided weakness (one slip, one trip). Did not use a device prior to last fall. Husband purchased a wheelchair for pt to use to get to bathroom door for the couple of days she was at home.  Pt currently requires mod assist for side to sit and min assist to sit EOB with tendency to lose balance posteriorly. Patient currently is not safe for transfers OOB and pt agrees with this assessment. Encouraged AROM while awaiting surgery.  Anticipate patient will benefit from PT to address problems listed below. Will continue to follow acutely to maximize functional mobility, independence, and safety.  Will reassess for discharge needs post-op.          If plan is discharge home, recommend the following:     Can travel by private vehicle        Equipment Recommendations BSC/3in1;Hospital bed  Recommendations for Other Services  OT consult    Functional Status Assessment Patient has had a recent decline in their functional status and demonstrates the ability to make significant improvements in function in a reasonable and predictable amount of time.     Precautions / Restrictions Precautions Precautions: Fall Recall of Precautions/Restrictions: Intact       Mobility  Bed Mobility Overal bed mobility: Needs Assistance Bed Mobility: Rolling, Sidelying to Sit, Sit to Sidelying Rolling: Min assist, Used rails Sidelying to sit: Mod assist, HOB elevated     Sit to sidelying: Min assist General bed mobility comments: prefers exit to left (simulates home environment); assist to roll due to pain in left side; assist to move LLE over EOB, assist to raise torso    Transfers                   General transfer comment: deferred for safety due to weakness and h/o multiple falls    Ambulation/Gait                  Stairs            Wheelchair Mobility     Tilt Bed    Modified Rankin (Stroke Patients Only)       Balance Overall balance assessment: Needs assistance Sitting-balance support: Single extremity supported, Feet supported Sitting balance-Leahy Scale: Poor Sitting balance - Comments: tendency to lean posterior                                     Pertinent Vitals/Pain Pain Assessment Pain Assessment: Faces Faces Pain Scale: Hurts a little bit Pain Location: left side Pain Descriptors / Indicators: Discomfort, Moaning, Grimacing Pain Intervention(s): Limited activity within patient's tolerance, Monitored during session, Repositioned    Home Living Family/patient expects to be discharged to:: Private residence Living Arrangements: Spouse/significant other;Children (5 children) Available Help at Discharge: Family;Available 24 hours/day  Type of Home: House Home Access: Ramped entrance       Home Layout: One level Home Equipment: Cane - single point;Wheelchair - manual      Prior Function Prior Level of Function : Independent/Modified Independent;History of Falls (last six months)             Mobility Comments: prior to christmas; 2 falls (one slip in shower, one step off curb); then getting out of car fell backwards       Extremity/Trunk Assessment   Upper Extremity  Assessment Upper Extremity Assessment: Defer to OT evaluation    Lower Extremity Assessment Lower Extremity Assessment: LLE deficits/detail LLE Deficits / Details: AAROM WFL; AROM hip 2+, knee extension 2+, ankle 0 LLE Sensation: history of peripheral neuropathy    Cervical / Trunk Assessment Cervical / Trunk Assessment: Other exceptions Cervical / Trunk Exceptions: overweight  Communication   Communication Communication: No apparent difficulties    Cognition Arousal: Alert Behavior During Therapy: WFL for tasks assessed/performed   PT - Cognitive impairments: No apparent impairments                         Following commands: Intact       Cueing Cueing Techniques: Verbal cues     General Comments      Exercises Other Exercises Other Exercises: LAQ RLE x 10; LLE x 3 Other Exercises: Encouraged all AROM she can do with all 4 extremities until surgery Other Exercises: Encouraged asking for assist with rolling rt and lt to reduce risk of bed sores   Assessment/Plan    PT Assessment Patient needs continued PT services  PT Problem List Decreased strength;Decreased activity tolerance;Decreased balance;Decreased mobility;Decreased knowledge of use of DME;Decreased knowledge of precautions;Impaired sensation;Obesity;Pain       PT Treatment Interventions Functional mobility training;Therapeutic activities;Therapeutic exercise;Balance training;Patient/family education    PT Goals (Current goals can be found in the Care Plan section)  Acute Rehab PT Goals Patient Stated Goal: go home ASAP PT Goal Formulation: With patient Time For Goal Achievement: 01/19/25 Potential to Achieve Goals: Good    Frequency Min 2X/week     Co-evaluation               AM-PAC PT 6 Clicks Mobility  Outcome Measure Help needed turning from your back to your side while in a flat bed without using bedrails?: A Little Help needed moving from lying on your back to sitting on  the side of a flat bed without using bedrails?: A Lot Help needed moving to and from a bed to a chair (including a wheelchair)?: Total Help needed standing up from a chair using your arms (e.g., wheelchair or bedside chair)?: Total Help needed to walk in hospital room?: Total Help needed climbing 3-5 steps with a railing? : Total 6 Click Score: 9    End of Session   Activity Tolerance: Patient tolerated treatment well Patient left: in bed;with call bell/phone within reach Nurse Communication: Mobility status PT Visit Diagnosis: Repeated falls (R29.6);Muscle weakness (generalized) (M62.81);Difficulty in walking, not elsewhere classified (R26.2)    Time: 8683-8653 PT Time Calculation (min) (ACUTE ONLY): 30 min   Charges:   PT Evaluation $PT Eval Low Complexity: 1 Low PT Treatments $Therapeutic Activity: 8-22 mins PT General Charges $$ ACUTE PT VISIT: 1 Visit          Macario RAMAN, PT Acute Rehabilitation Services  Office [phone]   Macario SHAUNNA Soja 01/05/2025, 1:58 PM

## 2025-01-05 NOTE — Consult Note (Signed)
 Reason for Consult: OPLL with left arm and leg weakness Referring Physician: EDP  April Warren is an 54 y.o. female.   HPI:  54 year old female who fell about 3 days ago.  She had gone to see her primary care physician who started her on treatment for suspected flu and sinusitis.  She fell backwards on a ramp and hit her head and neck and back.  She went to the emergency department and was evaluated but I really do not see a neurologic exam in the notes.  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  She then presented back to the emergency department after sliding out of a chair yesterday complaining of weakness in her left hand and leg.  She had an MRI of the brain cervical spine and thoracic spine and was admitted to the hospitalist service.  She states she has trouble using her left hand and moving her left foot.  She has trouble urinating laying in bed.  She states before she came to the hospital she was urinating fine.  She had an upper respiratory tract issue for about 4 weeks.  She was found to have AKI with increased creatinine.  Past Medical History:  Diagnosis Date   Allergy    Asthma    Certain time of the year   Chicken pox 54 yrs old   Diabetes mellitus 08/12/2012   type 2- dr alvia diagnosed   Diabetic retinopathy (HCC)    Dysmenorrhea 08/20/2012   HTN (hypertension) 08/20/2012   Hyperlipidemia, mixed 08/28/2013   Neuromuscular disorder (HCC)    Not sure of date   Neuropathy 02/16/2017   Obesity    Obesity, unspecified 08/28/2013   Other and unspecified hyperlipidemia 08/28/2013   Preventative health care 08/20/2012   Sinusitis 11/14/2013   Sinusitis, acute 05/02/2016    Past Surgical History:  Procedure Laterality Date   BREAST BIOPSY Right 01/29/2024   US  RT BREAST BX W LOC DEV 1ST LESION IMG BX SPEC US  GUIDE 01/29/2024 [facility]   EYE SURGERY     Laser surgery for bleeds   REFRACTIVE SURGERY   08/12/2012   right, left on 09/16/12    Allergies[1]  Social History   Tobacco Use   Smoking status: Never   Smokeless tobacco: Never  Substance Use Topics   Alcohol  use: Yes    Comment: One or two a year    Family History  Problem Relation Age of Onset   Cancer Mother 52       breast   Diabetes Mother        type 2   Hypertension Mother    Hypertension Father    Hyperlipidemia Father    COPD Father    Arthritis Father    Asthma Father    Hypertension Maternal Grandmother    Hyperlipidemia Maternal Grandmother    COPD Maternal Grandmother    Asthma Maternal Grandmother    Hypertension Maternal Grandfather    Heart attack Maternal Grandfather    Stroke Maternal Grandfather    Gout Brother    Cancer Brother        lung   Heart disease Paternal Grandfather    Alcohol  abuse Paternal Grandfather      Review of Systems  Positive ROS: As above  All other systems have been reviewed and were otherwise negative with the exception of those mentioned in the HPI and as above.  Objective: Vital signs  in last 24 hours: Temp:  [97.8 F (36.6 C)-98.9 F (37.2 C)] 97.8 F (36.6 C) (01/08 0817) Pulse Rate:  [52-66] 57 (01/08 0817) Resp:  [15-24] 16 (01/08 0817) BP: (92-143)/(41-93) 143/57 (01/08 0817) SpO2:  [94 %-98 %] 96 % (01/08 0817) Weight:  [875 kg] 124 kg (01/07 1011)  General Appearance: Alert, cooperative, no distress, appears stated age, morbidly obese with central obesity and thin limbs Head: Normocephalic, without obvious abnormality, atraumatic Eyes: PERRL, conjunctiva/corneas clear, EOM's intact     Throat: benign Neck: Supple, symmetrical, trachea midline Lungs:  respirations unlabored Heart: Regular rate and rhythm Abdomen: Soft, non-tender Extremities: Extremities normal, atraumatic, no cyanosis or edema Pulses: 2+ and symmetric all extremities Skin: Skin color, texture, turgor normal, no rashes or lesions  NEUROLOGIC:   Mental status: A&O x4, no  aphasia, good attention span, Memory and fund of knowledge appear to be appropriate Motor Exam -he appears to be spastic in all 4 extremities with a foot drop on the left and antigravity strength of hip flexion and knee extension, poor handgrip and poor finger extension on the left but fairly strong though spastic through the remainder of the upper extremity on the left.  Maybe some early atrophy of the right hand. Sensory Exam - grossly normal Reflexes: He is hyperreflexic with a positive Hoffmann sign bilaterally Coordination - g she appears to be spastic in her movement Gait -not tested Balance -not tested Cranial Nerves: I: smell Not tested  II: visual acuity  OS: na  OD: na  II: visual fields Full to confrontation  II: pupils Equal, round, reactive to light  III,VII: ptosis None  III,IV,VI: extraocular muscles  Full ROM  V: mastication Normal  V: facial light touch sensation  Normal  V,VII: corneal reflex  Present  VII: facial muscle function - upper  Normal  VII: facial muscle function - lower Normal  VIII: hearing Not tested  IX: soft palate elevation  Normal  IX,X: gag reflex Present  XI: trapezius strength  5/5  XI: sternocleidomastoid strength 5/5  XI: neck flexion strength  5/5  XII: tongue strength  Normal    Data Review Lab Results  Component Value Date   WBC 9.1 01/05/2025   HGB 11.1 (L) 01/05/2025   HCT 34.8 (L) 01/05/2025   MCV 97.5 01/05/2025   PLT 283 01/05/2025   Lab Results  Component Value Date   NA 139 01/05/2025   K 4.6 01/05/2025   CL 107 01/05/2025   CO2 24 01/05/2025   BUN 43 (H) 01/05/2025   CREATININE 1.54 (H) 01/05/2025   GLUCOSE 82 01/05/2025   Lab Results  Component Value Date   INR 1.03 03/04/2011    Radiology: MR THORACIC SPINE WO CONTRAST Result Date: 01/04/2025 EXAM: MRI THORACIC SPINE WITHOUT INTRAVENOUS CONTRAST 01/04/2025 06:29:13 PM TECHNIQUE: Multiplanar multisequence MRI of the thoracic spine was performed without the  administration of intravenous contrast. COMPARISON: MRI of the cervical spine and CT of the cervical spine on 01/02/2025. CLINICAL HISTORY: Mid-back pain. FINDINGS: BONES AND ALIGNMENT: Normal alignment. Normal vertebral body heights. Chronic appearing deformity of the T1 superior endplate. Additional chronic deformities of the T5 endplates. Mild bone marrow edema in the anterior and inferior T2 vertebral body which may reflect a nondisplaced fracture versus degenerative endplate changes. SPINAL CORD: Normal spinal cord volume. Normal spinal cord signal. Mild flattening of the ventral thoracic cord at T2-T3 without cord signal abnormality. Subtle flattening of the right ventral cord at T8-T9. SOFT TISSUES: Unremarkable. DEGENERATIVE  CHANGES: Chronic changes of the disc noted at T2. Prominent disc bulge at T2-T3 eccentric to the right which indents the ventral thecal sac with mild flattening of the ventral thoracic cord without cord signal abnormality. Mild spinal canal stenosis at T2-T3. Disc bulge at T8-T9 which indents the ventral thecal sac with subtle flattening of the right ventral cord. Additional shallow disc bulges at T9-T10 and T10-T11 which indent the ventral thecal sac without significant spinal canal stenosis. Facet arthrosis at multiple levels throughout the thoracic spine. Additional degenerative changes at multiple costovertebral articulations. Moderate foraminal stenosis on the right at T2-T3. Moderate foraminal stenosis on the right at T4-T5. Moderate foraminal stenosis bilaterally at T5-T6. Moderate foraminal stenosis on the left at T6-T7. Moderate right and severe left foraminal stenosis at T7-T8 primarily due to facet arthrosis. Moderate foraminal stenosis on the left at T9-T10. Moderate bilateral foraminal stenosis at T10-T11 and T11-T12. IMPRESSION: 1. Mild bone marrow edema in the anterior and inferior T2 vertebral body, possibly reflecting a nondisplaced fracture versus degenerative endplate  changes. 2. Prominent disc bulge at T2-T3 eccentric to the right, causing mild spinal canal stenosis and mild flattening of the ventral thoracic cord without cord signal abnormality. 3. Degenerative changes as above. Significant foraminal stenosis at multiple levels, greatest and severe on the left at T7-8. Electronically signed by: Donnice Mania MD MD 01/04/2025 08:00 PM EST RP Workstation: [id number]   MR BRAIN WO CONTRAST Result Date: 01/04/2025 EXAM: MRI BRAIN WITHOUT CONTRAST 01/04/2025 06:29:13 PM TECHNIQUE: Multiplanar multisequence MRI of the head/brain was performed without the administration of intravenous contrast. COMPARISON: CT head 01/02/2025 CLINICAL HISTORY: Neuro deficit, acute, stroke suspected; subacute FINDINGS: BRAIN AND VENTRICLES: No acute infarct. No intracranial hemorrhage. No mass. No midline shift. No hydrocephalus. The sella is unremarkable. Normal flow voids. ORBITS: No significant abnormality. SINUSES AND MASTOIDS: No significant abnormality. BONES AND SOFT TISSUES: Normal marrow signal. No significant soft tissue abnormality. IMPRESSION: 1. No significant abnormality. Electronically signed by: Gilmore Molt MD 01/04/2025 07:53 PM EST RP Workstation: [id number]   MR Cervical Spine Wo Contrast Result Date: 01/04/2025 EXAM: MRI CERVICAL SPINE WITHOUT CONTRAST 01/04/2025 06:29:13 PM TECHNIQUE: Multiplanar multisequence MRI of the cervical spine was performed. COMPARISON: CT cervical spine 01/02/2025. CLINICAL HISTORY: Neck pain, acute, no red flags. FINDINGS: BONES AND ALIGNMENT: Normal alignment. Normal vertebral body heights. Bone marrow signal is unremarkable. No bone marrow edema or evidence of fracture. Degenerative endplate changes. Redemonstrated prominent ossification of the posterior longitudinal ligament. No evidence of acute ligamentous injury in the cervical spine. SPINAL CORD: Normal spinal cord size. No abnormal spinal cord signal at C2-C3. Possible subtle cord signal  abnormality favored to reflect myelomalacia at C3-C4, C4-C5, and C5-C6. Subtle flattening of the ventral cord at C6-C7. SOFT TISSUES: No paraspinal mass. C2-C3: Prominent central disc protrusion which contacts the ventral cord with mild flattening of the cord. Thickening of the ligamentum flavum. Moderate spinal canal stenosis. No cord signal abnormality. Bilateral facet arthrosis. Mild foraminal stenosis on the left. 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 Thickening of the ligamentum flavum. Severe spinal canal stenosis and cord compression. Possible subtle cord signal abnormality favored to reflect myelomalacia. Bilateral facet arthrosis and uncovertebral hypertrophy. Mild right and moderate left foraminal stenosis. 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 Thickening of the ligamentum flavum. Severe spinal canal stenosis and cord compression noted. Subtle cord signal  abnormality favored to reflect myelomalacia. Bilateral facet arthrosis and uncovertebral hypertrophy. Moderate right and severe left foraminal stenosis. 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 Thickening of the ligamentum flavum. Moderate to severe spinal canal stenosis with cord compression at this level. Possible cord signal abnormality which is favoring myelomalacia. Bilateral facet arthrosis and uncovertebral hypertrophy. Moderate bilateral foraminal stenosis. C6-C7: Disc bulge eccentric to the right which indents the ventral thecal sac with subtle flattening of the ventral cord. Thickening of the ligamentum flavum. Mild spinal canal stenosis. Bilateral facet arthrosis. Mild foraminal stenosis on the left. C7-T1: Small disc bulge. No  significant spinal canal stenosis. Bilateral facet arthrosis. Mild foraminal stenosis on the left. IMPRESSION: 1. Severe spinal canal stenosis and cord compression at C3-4 and C4-5 with possible subtle cord signal abnormality favored to reflect myelomalacia. 2. Moderate to severe spinal canal stenosis with cord compression at C5-6 with possible cord signal abnormality favoring myelomalacia. 3. Moderate spinal canal stenosis at C2-3 without cord signal abnormality. 4. 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 5. Significant foraminal stenosis at multiple levels as above. 6. No findings to suggest acute traumatic injury. Electronically signed by: Donnice Mania MD MD 01/04/2025 07:49 PM EST RP Workstation: [id number]     Assessment/Plan: Estimated body mass index is 40.37 kg/m as calculated from the following:   Height as of this encounter: 5' 9 (1.753 m).   Weight as of this encounter: 124 kg.   Significant OPLL C2-C6 with severe canal stenosis with signal change in the cord and neurologic deterioration after recent fall.  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  Also has an upper respiratory tract infection and a bump in her creatinine.  She also has a very cushingoid appearance.  I do think she needs decompressive surgery and I think she needs a C2-C7 posterior laminectomy with a lateral mass fusion and fixation.  However given her upper respiratory tach issues and bump in creatinine I think we should tune her up medically before surgery, even given her weakness.  I think she has at least subacute myelopathy and then weakness of the hand and foot after the fall.  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  I do not think we should proceed  with urgent intervention.  I have briefly described the surgery to her and we have talked about typical outcomes and recovery times.  She understands that the risk of spinal cord injury at the time of positioning and surgery is higher than average and is certainly the most worrisome potential complication of the surgery, but given the amount of stenosis and the spinal cord injury I think surgery is indicated and her risk of neurologic deterioration without surgery is 100%.  Fully we can move forward with surgical intervention sometime next week.   Alm GORMAN Molt 01/05/2025 8:40 AM        [1] No Known Allergies

## 2025-01-05 NOTE — Progress Notes (Addendum)
 " PROGRESS NOTE    April Warren  FMW:[id number] DOB: Jun 28, 1971 DOA: 01/04/2025 PCP: [name] LABOR, MD  Chief Complaint  Patient presents with   Fall    Brief Narrative:    April Warren is April Warren 54 y.o. female with medical history significant for hypertension, diabetes, CKD and other medical issues who presented with falls and worsening weakness.   Imaging showed findings concerning for spinal canal stenosis and cord compression of cervical spine.   Neurosurgery planning surgery sometime next week.     Assessment & Plan:   Principal Problem:   Left-sided weakness Active Problems:   Type 2 diabetes mellitus in patient with obesity (HCC)   HTN (hypertension)   Fall  Left-sided weakness, falls MRI with severe spinal canal stenosis and cord compression at C3-4 and C4-5 with possible subtle cord signal abnormality favored to reflect myelomalacia.  Moderate to severe spinal canal stenosis with cord compression at C5-6 with possible cord signal abnormality favoring myelomalacia.  Moderate spinal canal stenosis at C2-3 without cord signal abnormality.  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 (See report) Appreciate neurosurgery evaluation -  recommending C2-C7 posterior laminectomy with lateral mass fusion and fixation, plan for next week PT/OT   Recent acute bronchitis Negative covid, flu, RSV CT from 1/1 without acute intrathoracic pathology Doxycycline    Diarrhea She notes diarrhea commonly while on abx Monitor  Uncontrolled diabetes mellitus with hypoglycemia  A1c 8.1 08/2024 A1c pending Holding home oral meds SSI for now   Hypertension Holding metoprolol , lisinopril , triamterene -HCTZ.   CKD 3 April Warren- creatinine 1.6 at presentation, baseline creatinine ranges between 1.1-1.5 trend  Urinary Retention Catheter in place (required 3 I&O cath)   DVT prophylaxis: SCD, lovenox  Code Status: full Family Communication: husband at bedside Disposition:    Status is: Observation The patient will require care spanning > 2 midnights and should be moved to inpatient because: need to continue inpatient care   Consultants:  neurosurgery  Procedures:  none  Antimicrobials:  Anti-infectives (From admission, onward)    Start     Dose/Rate Route Frequency Ordered Stop   01/04/25 2215  doxycycline  (VIBRA -TABS) tablet 100 mg        100 mg Oral 2 times daily 01/04/25 2127         Subjective: Husband at bedside Denies pain or new complaints  Objective: Vitals:   01/05/25 0323 01/05/25 0817 01/05/25 1144 01/05/25 1144  BP: (!) 120/41 (!) 143/57 (!) 121/59 (!) 121/59  Pulse: (!) 56 (!) 57 63 64  Resp: 17 16 18 18   Temp: 98.2 F (36.8 C) 97.8 F (36.6 C) 97.8 F (36.6 C) 97.8 F (36.6 C)  TempSrc: Oral Oral    SpO2: 98% 96% 97% 97%  Weight:      Height:        Intake/Output Summary (Last 24 hours) at 01/05/2025 1455 Last data filed at 01/05/2025 0946 Gross per 24 hour  Intake --  Output 2975 ml  Net -2975 ml   Filed Weights   01/04/25 1011  Weight: 124 kg    Examination:  General exam: Appears calm and comfortable - obese Respiratory system: Clear to auscultation. Respiratory effort normal.  Unlabored, occasional cough. Cardiovascular system: RRR Gastrointestinal system: Abdomen is nondistended, soft and nontender. Central nervous system: L sided weakness Extremities: no LEE   Data Reviewed: I have personally reviewed following labs and imaging studies  CBC: Recent Labs  Lab 01/04/25 1114 01/05/25 0231  WBC 9.0 9.1  NEUTROABS 6.0  --  HGB 11.5* 11.1*  HCT 37.6 34.8*  MCV 98.7 97.5  PLT 306 283    Basic Metabolic Panel: Recent Labs  Lab 01/04/25 1114 01/05/25 0231  NA 143 139  K 5.3* 4.6  CL 109 107  CO2 21* 24  GLUCOSE 74 82  BUN 51* 43*  CREATININE 1.69* 1.54*  CALCIUM  8.8* 8.5*  MG 2.0  --     GFR: Estimated Creatinine Clearance: 58.9 mL/min (April Warren) (by C-G formula based on SCr of 1.54 mg/dL  (H)).  Liver Function Tests: No results for input(s): AST, ALT, ALKPHOS, BILITOT, PROT, ALBUMIN in the last 168 hours.  CBG: Recent Labs  Lab 01/04/25 2307 01/04/25 2335 01/05/25 0306 01/05/25 0757 01/05/25 1143  GLUCAP 58* 110* 71 83 155*     Recent Results (from the past 240 hours)  Resp panel by RT-PCR (RSV, Flu Brehanna Deveny&B, Covid) Anterior Nasal Swab     Status: None   Collection Time: 01/04/25 11:36 AM   Specimen: Anterior Nasal Swab  Result Value Ref Range Status   SARS Coronavirus 2 by RT PCR NEGATIVE NEGATIVE Final    Comment: (NOTE) SARS-CoV-2 target nucleic acids are NOT DETECTED.  The SARS-CoV-2 RNA is generally detectable in upper respiratory specimens during the acute phase of infection. The lowest concentration of SARS-CoV-2 viral copies this assay can detect is 138 copies/mL. Javeah Loeza negative result does not preclude SARS-Cov-2 infection and should not be used as the sole basis for treatment or other patient management decisions. Sylina Henion negative result may occur with  improper specimen collection/handling, submission of specimen other than nasopharyngeal swab, presence of viral mutation(s) within the areas targeted by this assay, and inadequate number of viral copies(<138 copies/mL). Nataleah Scioneaux negative result must be combined with clinical observations, patient history, and epidemiological information. The expected result is Negative.  Fact Sheet for Patients:  bloggercourse.com  Fact Sheet for Healthcare Providers:  seriousbroker.it  This test is no t yet approved or cleared by the [place]  FDA and  has been authorized for detection and/or diagnosis of SARS-CoV-2 by FDA under an Emergency Use Authorization (EUA). This EUA will remain  in effect (meaning this test can be used) for the duration of the COVID-19 declaration under Section 564(b)(1) of the Act, 21 U.S.C.section 360bbb-3(b)(1), unless the authorization  is terminated  or revoked sooner.       Influenza Tanyiah Laurich by PCR NEGATIVE NEGATIVE Final   Influenza B by PCR NEGATIVE NEGATIVE Final    Comment: (NOTE) The Xpert Xpress SARS-CoV-2/FLU/RSV plus assay is intended as an aid in the diagnosis of influenza from Nasopharyngeal swab specimens and should not be used as Cheetara Hoge sole basis for treatment. Nasal washings and aspirates are unacceptable for Xpert Xpress SARS-CoV-2/FLU/RSV testing.  Fact Sheet for Patients: bloggercourse.com  Fact Sheet for Healthcare Providers: seriousbroker.it  This test is not yet approved or cleared by the [place]  FDA and has been authorized for detection and/or diagnosis of SARS-CoV-2 by FDA under an Emergency Use Authorization (EUA). This EUA will remain in effect (meaning this test can be used) for the duration of the COVID-19 declaration under Section 564(b)(1) of the Act, 21 U.S.C. section 360bbb-3(b)(1), unless the authorization is terminated or revoked.     Resp Syncytial Virus by PCR NEGATIVE NEGATIVE Final    Comment: (NOTE) Fact Sheet for Patients: bloggercourse.com  Fact Sheet for Healthcare Providers: seriousbroker.it  This test is not yet approved or cleared by the [place]  FDA and has been authorized for detection and/or diagnosis of  SARS-CoV-2 by FDA under an Emergency Use Authorization (EUA). This EUA will remain in effect (meaning this test can be used) for the duration of the COVID-19 declaration under Section 564(b)(1) of the Act, 21 U.S.C. section 360bbb-3(b)(1), unless the authorization is terminated or revoked.  Performed at [facility], [address], [place], [place] [address]          Radiology Studies: MR THORACIC SPINE WO CONTRAST Result Date: 01/04/2025 EXAM: MRI THORACIC SPINE WITHOUT INTRAVENOUS CONTRAST 01/04/2025 06:29:13 PM TECHNIQUE: Multiplanar multisequence  MRI of the thoracic spine was performed without the administration of intravenous contrast. COMPARISON: MRI of the cervical spine and CT of the cervical spine on 01/02/2025. CLINICAL HISTORY: Mid-back pain. FINDINGS: BONES AND ALIGNMENT: Normal alignment. Normal vertebral body heights. Chronic appearing deformity of the T1 superior endplate. Additional chronic deformities of the T5 endplates. Mild bone marrow edema in the anterior and inferior T2 vertebral body which may reflect Brizza Nathanson nondisplaced fracture versus degenerative endplate changes. SPINAL CORD: Normal spinal cord volume. Normal spinal cord signal. Mild flattening of the ventral thoracic cord at T2-T3 without cord signal abnormality. Subtle flattening of the right ventral cord at T8-T9. SOFT TISSUES: Unremarkable. DEGENERATIVE CHANGES: Chronic changes of the disc noted at T2. Prominent disc bulge at T2-T3 eccentric to the right which indents the ventral thecal sac with mild flattening of the ventral thoracic cord without cord signal abnormality. Mild spinal canal stenosis at T2-T3. Disc bulge at T8-T9 which indents the ventral thecal sac with subtle flattening of the right ventral cord. Additional shallow disc bulges at T9-T10 and T10-T11 which indent the ventral thecal sac without significant spinal canal stenosis. Facet arthrosis at multiple levels throughout the thoracic spine. Additional degenerative changes at multiple costovertebral articulations. Moderate foraminal stenosis on the right at T2-T3. Moderate foraminal stenosis on the right at T4-T5. Moderate foraminal stenosis bilaterally at T5-T6. Moderate foraminal stenosis on the left at T6-T7. Moderate right and severe left foraminal stenosis at T7-T8 primarily due to facet arthrosis. Moderate foraminal stenosis on the left at T9-T10. Moderate bilateral foraminal stenosis at T10-T11 and T11-T12. IMPRESSION: 1. Mild bone marrow edema in the anterior and inferior T2 vertebral body, possibly reflecting Demetri Goshert  nondisplaced fracture versus degenerative endplate changes. 2. Prominent disc bulge at T2-T3 eccentric to the right, causing mild spinal canal stenosis and mild flattening of the ventral thoracic cord without cord signal abnormality. 3. Degenerative changes as above. Significant foraminal stenosis at multiple levels, greatest and severe on the left at T7-8. Electronically signed by: Donnice Mania MD MD 01/04/2025 08:00 PM EST RP Workstation: [id number]   MR BRAIN WO CONTRAST Result Date: 01/04/2025 EXAM: MRI BRAIN WITHOUT CONTRAST 01/04/2025 06:29:13 PM TECHNIQUE: Multiplanar multisequence MRI of the head/brain was performed without the administration of intravenous contrast. COMPARISON: CT head 01/02/2025 CLINICAL HISTORY: Neuro deficit, acute, stroke suspected; subacute FINDINGS: BRAIN AND VENTRICLES: No acute infarct. No intracranial hemorrhage. No mass. No midline shift. No hydrocephalus. The sella is unremarkable. Normal flow voids. ORBITS: No significant abnormality. SINUSES AND MASTOIDS: No significant abnormality. BONES AND SOFT TISSUES: Normal marrow signal. No significant soft tissue abnormality. IMPRESSION: 1. No significant abnormality. Electronically signed by: Gilmore Molt MD 01/04/2025 07:53 PM EST RP Workstation: [id number]   MR Cervical Spine Wo Contrast Result Date: 01/04/2025 EXAM: MRI CERVICAL SPINE WITHOUT CONTRAST 01/04/2025 06:29:13 PM TECHNIQUE: Multiplanar multisequence MRI of the cervical spine was performed. COMPARISON: CT cervical spine 01/02/2025. CLINICAL HISTORY: Neck pain, acute, no red flags. FINDINGS: BONES AND ALIGNMENT: Normal alignment. Normal  vertebral body heights. Bone marrow signal is unremarkable. No bone marrow edema or evidence of fracture. Degenerative endplate changes. Redemonstrated prominent ossification of the posterior longitudinal ligament. No evidence of acute ligamentous injury in the cervical spine. SPINAL CORD: Normal spinal cord size. No abnormal spinal  cord signal at C2-C3. Possible subtle cord signal abnormality favored to reflect myelomalacia at C3-C4, C4-C5, and C5-C6. Subtle flattening of the ventral cord at C6-C7. SOFT TISSUES: No paraspinal mass. C2-C3: Prominent central disc protrusion which contacts the ventral cord with mild flattening of the cord. Thickening of the ligamentum flavum. Moderate spinal canal stenosis. No cord signal abnormality. Bilateral facet arthrosis. Mild foraminal stenosis on the left. 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 Thickening of the ligamentum flavum. Severe spinal canal stenosis and cord compression. Possible subtle cord signal abnormality favored to reflect myelomalacia. Bilateral facet arthrosis and uncovertebral hypertrophy. Mild right and moderate left foraminal stenosis. 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 Thickening of the ligamentum flavum. Severe spinal canal stenosis and cord compression noted. Subtle cord signal abnormality favored to reflect myelomalacia. Bilateral facet arthrosis and uncovertebral hypertrophy. Moderate right and severe left foraminal stenosis. 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 Thickening of the ligamentum flavum. Moderate to severe spinal canal stenosis with cord compression at this level. Possible cord signal abnormality which is favoring myelomalacia. Bilateral facet arthrosis and uncovertebral hypertrophy. Moderate bilateral foraminal stenosis. C6-C7: Disc bulge eccentric to the right which indents the ventral thecal sac with subtle flattening of the ventral cord. Thickening of the ligamentum flavum. Mild spinal canal stenosis. Bilateral facet arthrosis. Mild foraminal  stenosis on the left. C7-T1: Small disc bulge. No significant spinal canal stenosis. Bilateral facet arthrosis. Mild foraminal stenosis on the left. IMPRESSION: 1. Severe spinal canal stenosis and cord compression at C3-4 and C4-5 with possible subtle cord signal abnormality favored to reflect myelomalacia. 2. Moderate to severe spinal canal stenosis with cord compression at C5-6 with possible cord signal abnormality favoring myelomalacia. 3. Moderate spinal canal stenosis at C2-3 without cord signal abnormality. 4. 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 5. Significant foraminal stenosis at multiple levels as above. 6. No findings to suggest acute traumatic injury. Electronically signed by: Donnice Mania MD MD 01/04/2025 07:49 PM EST RP Workstation: [id number]        Scheduled Meds:  atorvastatin   80 mg Oral Daily   Chlorhexidine  Gluconate Cloth  6 each Topical Daily   doxycycline   100 mg Oral BID   insulin  aspart  0-9 Units Subcutaneous Q4H   Continuous Infusions:   LOS: 0 days    Time spent: over 30 min     Meliton Monte, MD [facility]   To contact the attending provider between 7A-7P or the covering provider during after hours 7P-7A, please log into the web site [email or web address] and access using universal [place] password for that web site. If you do not have the password, please call the hospital operator.  01/05/2025, 2:55 PM    "

## 2025-01-05 NOTE — TOC Initial Note (Signed)
 Transition of Care [facility]) - Initial/Assessment Note    Patient Details  Name: April Warren MRN: [id number] Date of Birth: Jul 16, 1971  [facility] CM/SW Contact:    Andrez JULIANNA George, RN Phone Number: 01/05/2025, 10:51 AM  Clinical Narrative:                  Adie Vilar is a 54 y.o. female with medical history significant for diabetic retinopathy, hypertension.  Patient presented to the ED with complaints of falls, worsening weakness over the past 2 days.   Pt is from home with her spouse and foster children. Spouse is with her all the time.  She manages her own medications and denies issues.  She drives or her spouse can provide transportation.  Neurosurgery to do C2-C7 posterior laminectomy with a lateral mass fusion and fixation next week.  IP care management following.  Expected Discharge Plan: Home/Self Care Barriers to Discharge: Continued Medical Work up   Patient Goals and CMS Choice     Choice offered to / list presented to : Patient      Expected Discharge Plan and Services   Discharge Planning Services: CM Consult   Living arrangements for the past 2 months: Single Family Home                                      Prior Living Arrangements/Services Living arrangements for the past 2 months: Single Family Home Lives with:: Minor Children, Spouse Patient language and need for interpreter reviewed:: Yes Do you feel safe going back to the place where you live?: Yes        Care giver support system in place?: Yes (comment) Current home services: DME (cane/ wheelchair) Criminal Activity/Legal Involvement Pertinent to Current Situation/Hospitalization: No - Comment as needed  Activities of Daily Living   ADL Screening (condition at time of admission) Independently performs ADLs?: No Does the patient have a NEW difficulty with bathing/dressing/toileting/self-feeding that is expected to last >3 days?: Yes (Initiates electronic notice to provider  for possible OT consult) Does the patient have a NEW difficulty with getting in/out of bed, walking, or climbing stairs that is expected to last >3 days?: Yes (Initiates electronic notice to provider for possible PT consult) Does the patient have a NEW difficulty with communication that is expected to last >3 days?: No Is the patient deaf or have difficulty hearing?: No Does the patient have difficulty seeing, even when wearing glasses/contacts?: No Does the patient have difficulty concentrating, remembering, or making decisions?: No  Permission Sought/Granted                  Emotional Assessment Appearance:: Appears stated age Attitude/Demeanor/Rapport: Engaged Affect (typically observed): Accepting Orientation: : Oriented to Self, Oriented to Place, Oriented to  Time, Oriented to Situation   Psych Involvement: No (comment)  Admission diagnosis:  Dehydration [E86.0] Weakness [R53.1] Bronchitis [J40] Left-sided weakness [R53.1] Acute kidney injury [N17.9] Fall, initial encounter [W19.XXXA] Patient Active Problem List   Diagnosis Date Noted   Fall 01/04/2025   Left-sided weakness 01/04/2025   Vitamin D  deficiency 04/30/2023   Right shoulder pain 04/15/2022   Left hip pain 04/15/2022   Contact with and (suspected) exposure to covid-19 01/15/2020   Atrophic vaginitis 01/06/2019   Anxiety 08/19/2018   Muscle cramps 07/13/2017   Neuropathy 02/16/2017   Breast cancer screening 02/03/2016   Cervical cancer screening 11/14/2013  Hyperlipidemia, mixed 08/28/2013   HTN (hypertension) 08/20/2012   Preventative health care 08/20/2012   Dysmenorrhea 08/20/2012   Diabetic retinopathy (HCC)    Obesity    Allergy    Type 2 diabetes mellitus in patient with obesity (HCC) 08/12/2012   PCP:  [name] LABOR, MD Pharmacy:   [facility] DELIVERY - [address] [place] - [address] [place] [address] Phone: [phone] Fax:  [phone]     Social Drivers of Health (SDOH) Social History: SDOH Screenings   Food Insecurity: No Food Insecurity (01/04/2025)  Housing: Low Risk (01/04/2025)  Transportation Needs: No Transportation Needs (01/04/2025)  Utilities: Not At Risk (01/04/2025)  Alcohol  Screen: Low Risk (04/29/2023)  Depression (PHQ2-9): Medium Risk (01/02/2025)  Financial Resource Strain: Medium Risk (06/09/2024)  Physical Activity: Inactive (06/09/2024)  Social Connections: Moderately Integrated (01/04/2025)  Stress: Stress Concern Present (06/09/2024)  Tobacco Use: Low Risk (01/05/2025)   SDOH Interventions:     Readmission Risk Interventions     No data to display

## 2025-01-06 DIAGNOSIS — R531 Weakness: Secondary | ICD-10-CM | POA: Diagnosis not present

## 2025-01-06 LAB — CBC
HCT: 35.1 % — ABNORMAL LOW (ref 36.0–46.0)
Hemoglobin: 11.4 g/dL — ABNORMAL LOW (ref 12.0–15.0)
MCH: 30.7 pg (ref 26.0–34.0)
MCHC: 32.5 g/dL (ref 30.0–36.0)
MCV: 94.6 fL (ref 80.0–100.0)
Platelets: 278 K/uL (ref 150–400)
RBC: 3.71 MIL/uL — ABNORMAL LOW (ref 3.87–5.11)
RDW: 12.2 % (ref 11.5–15.5)
WBC: 7.6 K/uL (ref 4.0–10.5)
nRBC: 0 % (ref 0.0–0.2)

## 2025-01-06 LAB — BASIC METABOLIC PANEL WITH GFR
Anion gap: 9 (ref 5–15)
BUN: 29 mg/dL — ABNORMAL HIGH (ref 6–20)
CO2: 23 mmol/L (ref 22–32)
Calcium: 9 mg/dL (ref 8.9–10.3)
Chloride: 106 mmol/L (ref 98–111)
Creatinine, Ser: 1.07 mg/dL — ABNORMAL HIGH (ref 0.44–1.00)
GFR, Estimated: >60 mL/min
Glucose, Bld: 145 mg/dL — ABNORMAL HIGH (ref 70–99)
Potassium: 4.1 mmol/L (ref 3.5–5.1)
Sodium: 138 mmol/L (ref 135–145)

## 2025-01-06 LAB — GLUCOSE, CAPILLARY
Glucose-Capillary: 144 mg/dL — ABNORMAL HIGH (ref 70–99)
Glucose-Capillary: 146 mg/dL — ABNORMAL HIGH (ref 70–99)
Glucose-Capillary: 150 mg/dL — ABNORMAL HIGH (ref 70–99)
Glucose-Capillary: 160 mg/dL — ABNORMAL HIGH (ref 70–99)
Glucose-Capillary: 166 mg/dL — ABNORMAL HIGH (ref 70–99)
Glucose-Capillary: 190 mg/dL — ABNORMAL HIGH (ref 70–99)
Glucose-Capillary: 205 mg/dL — ABNORMAL HIGH (ref 70–99)

## 2025-01-06 LAB — PHOSPHORUS: Phosphorus: 2.5 mg/dL (ref 2.5–4.6)

## 2025-01-06 LAB — MAGNESIUM: Magnesium: 1.6 mg/dL — ABNORMAL LOW (ref 1.7–2.4)

## 2025-01-06 MED ORDER — POLYVINYL ALCOHOL 1.4 % OP SOLN
1.0000 [drp] | Freq: Three times a day (TID) | OPHTHALMIC | Status: DC | PRN
  Filled 2025-01-06: qty 15

## 2025-01-06 NOTE — Progress Notes (Signed)
 " PROGRESS NOTE    [name]  FMW:[id number] DOB: January 10, 1971 DOA: 01/04/2025 PCP: [name] LABOR, MD  Chief Complaint  Patient presents with   Fall    Brief Narrative:    April Warren is April Warren 54 y.o. female with medical history significant for hypertension, diabetes, CKD and other medical issues who presented with falls and worsening weakness.   Imaging showed findings concerning for spinal canal stenosis and cord compression of cervical spine.   Neurosurgery planning surgery sometime next week.     Assessment & Plan:   Principal Problem:   Left-sided weakness Active Problems:   Type 2 diabetes mellitus in patient with obesity (HCC)   HTN (hypertension)   Fall  Left-sided weakness, falls MRI with severe spinal canal stenosis and cord compression at C3-4 and C4-5 with possible subtle cord signal abnormality favored to reflect myelomalacia.  Moderate to severe spinal canal stenosis with cord compression at C5-6 with possible cord signal abnormality favoring myelomalacia.  Moderate spinal canal stenosis at C2-3 without cord signal abnormality.  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 (See report) Appreciate neurosurgery evaluation -  recommending C2-C7 posterior laminectomy with lateral mass fusion and fixation, plan for surgery 1/12.  Lovenox  ordered to stop after 1/10. PT/OT   Recent acute bronchitis Negative covid, flu, RSV CT from 1/1 without acute intrathoracic pathology Doxycycline    Diarrhea She notes diarrhea commonly while on abx Monitor  Uncontrolled diabetes mellitus with hypoglycemia  A1c 8.1 08/2024 A1c pending Holding home oral meds SSI for now   Hypertension Holding metoprolol , lisinopril , triamterene -HCTZ.   CKD 3 April Warren- creatinine 1.6 at presentation, baseline creatinine ranges between 1.1-1.5 trend  Urinary Retention Catheter in place since 1/8.    DVT prophylaxis: SCD, lovenox  Code Status: full Family Communication:  husband at bedside Disposition:   Status is: Observation The patient will require care spanning > 2 midnights and should be moved to inpatient because: need to continue inpatient care   Consultants:  neurosurgery  Procedures:  none  Antimicrobials:  Anti-infectives (From admission, onward)    Start     Dose/Rate Route Frequency Ordered Stop   01/04/25 2215  doxycycline  (VIBRA -TABS) tablet 100 mg        100 mg Oral 2 times daily 01/04/25 2127         Subjective: Husband at bedside  Objective: Vitals:   01/06/25 0006 01/06/25 0446 01/06/25 0710 01/06/25 1216  BP: (!) 138/56 131/65 (!) 140/57 (!) 141/52  Pulse: (!) 48 (!) 54 60 65  Resp: 13 16 15    Temp: 97.9 F (36.6 C) 98.1 F (36.7 C) 98.4 F (36.9 C)   TempSrc: Oral Oral Oral   SpO2: 98% 99% 98% 97%  Weight:      Height:        Intake/Output Summary (Last 24 hours) at 01/06/2025 1356 Last data filed at 01/06/2025 1119 Gross per 24 hour  Intake --  Output 2450 ml  Net -2450 ml   Filed Weights   01/04/25 1011  Weight: 124 kg    Examination:  General: No acute distress. Cardiovascular: RRR Lungs: Clear to auscultation bilaterally - occasional coarse cough Abdomen: Soft, nontender, nondistended Neurological: L sided  > R sided weakness Extremities: No clubbing or cyanosis. No edema.    Data Reviewed: I have personally reviewed following labs and imaging studies  CBC: Recent Labs  Lab 01/04/25 1114 01/05/25 0231 01/06/25 0157  WBC 9.0 9.1 7.6  NEUTROABS 6.0  --   --  HGB 11.5* 11.1* 11.4*  HCT 37.6 34.8* 35.1*  MCV 98.7 97.5 94.6  PLT 306 283 278    Basic Metabolic Panel: Recent Labs  Lab 01/04/25 1114 01/05/25 0231 01/06/25 0157  NA 143 139 138  K 5.3* 4.6 4.1  CL 109 107 106  CO2 21* 24 23  GLUCOSE 74 82 145*  BUN 51* 43* 29*  CREATININE 1.69* 1.54* 1.07*  CALCIUM  8.8* 8.5* 9.0  MG 2.0  --  1.6*  PHOS  --   --  2.5    GFR: Estimated Creatinine Clearance: 84.7 mL/min (Aashir Umholtz) (by  C-G formula based on SCr of 1.07 mg/dL (H)).  Liver Function Tests: No results for input(s): AST, ALT, ALKPHOS, BILITOT, PROT, ALBUMIN in the last 168 hours.  CBG: Recent Labs  Lab 01/05/25 2025 01/06/25 0047 01/06/25 0457 01/06/25 0716 01/06/25 1216  GLUCAP 206* 146* 144* 150* 205*     Recent Results (from the past 240 hours)  Resp panel by RT-PCR (RSV, Flu Lucrezia Dehne&B, Covid) Anterior Nasal Swab     Status: None   Collection Time: 01/04/25 11:36 AM   Specimen: Anterior Nasal Swab  Result Value Ref Range Status   SARS Coronavirus 2 by RT PCR NEGATIVE NEGATIVE Final    Comment: (NOTE) SARS-CoV-2 target nucleic acids are NOT DETECTED.  The SARS-CoV-2 RNA is generally detectable in upper respiratory specimens during the acute phase of infection. The lowest concentration of SARS-CoV-2 viral copies this assay can detect is 138 copies/mL. Trevaris Pennella negative result does not preclude SARS-Cov-2 infection and should not be used as the sole basis for treatment or other patient management decisions. Laurajean Hosek negative result may occur with  improper specimen collection/handling, submission of specimen other than nasopharyngeal swab, presence of viral mutation(s) within the areas targeted by this assay, and inadequate number of viral copies(<138 copies/mL). Sadhana Frater negative result must be combined with clinical observations, patient history, and epidemiological information. The expected result is Negative.  Fact Sheet for Patients:  bloggercourse.com  Fact Sheet for Healthcare Providers:  seriousbroker.it  This test is no t yet approved or cleared by the [place]  FDA and  has been authorized for detection and/or diagnosis of SARS-CoV-2 by FDA under an Emergency Use Authorization (EUA). This EUA will remain  in effect (meaning this test can be used) for the duration of the COVID-19 declaration under Section 564(b)(1) of the Act,  21 U.S.C.section 360bbb-3(b)(1), unless the authorization is terminated  or revoked sooner.       Influenza Andreus Cure by PCR NEGATIVE NEGATIVE Final   Influenza B by PCR NEGATIVE NEGATIVE Final    Comment: (NOTE) The Xpert Xpress SARS-CoV-2/FLU/RSV plus assay is intended as an aid in the diagnosis of influenza from Nasopharyngeal swab specimens and should not be used as Tori Dattilio sole basis for treatment. Nasal washings and aspirates are unacceptable for Xpert Xpress SARS-CoV-2/FLU/RSV testing.  Fact Sheet for Patients: bloggercourse.com  Fact Sheet for Healthcare Providers: seriousbroker.it  This test is not yet approved or cleared by the [place]  FDA and has been authorized for detection and/or diagnosis of SARS-CoV-2 by FDA under an Emergency Use Authorization (EUA). This EUA will remain in effect (meaning this test can be used) for the duration of the COVID-19 declaration under Section 564(b)(1) of the Act, 21 U.S.C. section 360bbb-3(b)(1), unless the authorization is terminated or revoked.     Resp Syncytial Virus by PCR NEGATIVE NEGATIVE Final    Comment: (NOTE) Fact Sheet for Patients: bloggercourse.com  Fact Sheet for Healthcare Providers:  seriousbroker.it  This test is not yet approved or cleared by the [place]  FDA and has been authorized for detection and/or diagnosis of SARS-CoV-2 by FDA under an Emergency Use Authorization (EUA). This EUA will remain in effect (meaning this test can be used) for the duration of the COVID-19 declaration under Section 564(b)(1) of the Act, 21 U.S.C. section 360bbb-3(b)(1), unless the authorization is terminated or revoked.  Performed at [facility], [address], [place], [place] [address]          Radiology Studies: MR THORACIC SPINE WO CONTRAST Result Date: 01/04/2025 EXAM: MRI THORACIC SPINE WITHOUT INTRAVENOUS CONTRAST  01/04/2025 06:29:13 PM TECHNIQUE: Multiplanar multisequence MRI of the thoracic spine was performed without the administration of intravenous contrast. COMPARISON: MRI of the cervical spine and CT of the cervical spine on 01/02/2025. CLINICAL HISTORY: Mid-back pain. FINDINGS: BONES AND ALIGNMENT: Normal alignment. Normal vertebral body heights. Chronic appearing deformity of the T1 superior endplate. Additional chronic deformities of the T5 endplates. Mild bone marrow edema in the anterior and inferior T2 vertebral body which may reflect Naomii Kreger nondisplaced fracture versus degenerative endplate changes. SPINAL CORD: Normal spinal cord volume. Normal spinal cord signal. Mild flattening of the ventral thoracic cord at T2-T3 without cord signal abnormality. Subtle flattening of the right ventral cord at T8-T9. SOFT TISSUES: Unremarkable. DEGENERATIVE CHANGES: Chronic changes of the disc noted at T2. Prominent disc bulge at T2-T3 eccentric to the right which indents the ventral thecal sac with mild flattening of the ventral thoracic cord without cord signal abnormality. Mild spinal canal stenosis at T2-T3. Disc bulge at T8-T9 which indents the ventral thecal sac with subtle flattening of the right ventral cord. Additional shallow disc bulges at T9-T10 and T10-T11 which indent the ventral thecal sac without significant spinal canal stenosis. Facet arthrosis at multiple levels throughout the thoracic spine. Additional degenerative changes at multiple costovertebral articulations. Moderate foraminal stenosis on the right at T2-T3. Moderate foraminal stenosis on the right at T4-T5. Moderate foraminal stenosis bilaterally at T5-T6. Moderate foraminal stenosis on the left at T6-T7. Moderate right and severe left foraminal stenosis at T7-T8 primarily due to facet arthrosis. Moderate foraminal stenosis on the left at T9-T10. Moderate bilateral foraminal stenosis at T10-T11 and T11-T12. IMPRESSION: 1. Mild bone marrow edema in the  anterior and inferior T2 vertebral body, possibly reflecting Levent Kornegay nondisplaced fracture versus degenerative endplate changes. 2. Prominent disc bulge at T2-T3 eccentric to the right, causing mild spinal canal stenosis and mild flattening of the ventral thoracic cord without cord signal abnormality. 3. Degenerative changes as above. Significant foraminal stenosis at multiple levels, greatest and severe on the left at T7-8. Electronically signed by: Donnice Mania MD MD 01/04/2025 08:00 PM EST RP Workstation: [id number]   MR BRAIN WO CONTRAST Result Date: 01/04/2025 EXAM: MRI BRAIN WITHOUT CONTRAST 01/04/2025 06:29:13 PM TECHNIQUE: Multiplanar multisequence MRI of the head/brain was performed without the administration of intravenous contrast. COMPARISON: CT head 01/02/2025 CLINICAL HISTORY: Neuro deficit, acute, stroke suspected; subacute FINDINGS: BRAIN AND VENTRICLES: No acute infarct. No intracranial hemorrhage. No mass. No midline shift. No hydrocephalus. The sella is unremarkable. Normal flow voids. ORBITS: No significant abnormality. SINUSES AND MASTOIDS: No significant abnormality. BONES AND SOFT TISSUES: Normal marrow signal. No significant soft tissue abnormality. IMPRESSION: 1. No significant abnormality. Electronically signed by: Gilmore Molt MD 01/04/2025 07:53 PM EST RP Workstation: [id number]   MR Cervical Spine Wo Contrast Result Date: 01/04/2025 EXAM: MRI CERVICAL SPINE WITHOUT CONTRAST 01/04/2025 06:29:13 PM TECHNIQUE: Multiplanar multisequence MRI of the  cervical spine was performed. COMPARISON: CT cervical spine 01/02/2025. CLINICAL HISTORY: Neck pain, acute, no red flags. FINDINGS: BONES AND ALIGNMENT: Normal alignment. Normal vertebral body heights. Bone marrow signal is unremarkable. No bone marrow edema or evidence of fracture. Degenerative endplate changes. Redemonstrated prominent ossification of the posterior longitudinal ligament. No evidence of acute ligamentous injury in the cervical  spine. SPINAL CORD: Normal spinal cord size. No abnormal spinal cord signal at C2-C3. Possible subtle cord signal abnormality favored to reflect myelomalacia at C3-C4, C4-C5, and C5-C6. Subtle flattening of the ventral cord at C6-C7. SOFT TISSUES: No paraspinal mass. C2-C3: Prominent central disc protrusion which contacts the ventral cord with mild flattening of the cord. Thickening of the ligamentum flavum. Moderate spinal canal stenosis. No cord signal abnormality. Bilateral facet arthrosis. Mild foraminal stenosis on the left. 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 Thickening of the ligamentum flavum. Severe spinal canal stenosis and cord compression. Possible subtle cord signal abnormality favored to reflect myelomalacia. Bilateral facet arthrosis and uncovertebral hypertrophy. Mild right and moderate left foraminal stenosis. 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 Thickening of the ligamentum flavum. Severe spinal canal stenosis and cord compression noted. Subtle cord signal abnormality favored to reflect myelomalacia. Bilateral facet arthrosis and uncovertebral hypertrophy. Moderate right and severe left foraminal stenosis. 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 Thickening of the ligamentum flavum. Moderate to severe spinal canal stenosis with cord compression at this level. Possible cord signal abnormality which is favoring myelomalacia. Bilateral facet arthrosis and uncovertebral hypertrophy. Moderate bilateral foraminal stenosis. C6-C7: Disc bulge eccentric to the right which indents the ventral thecal sac with subtle flattening of the ventral cord. Thickening of the ligamentum flavum. Mild spinal  canal stenosis. Bilateral facet arthrosis. Mild foraminal stenosis on the left. C7-T1: Small disc bulge. No significant spinal canal stenosis. Bilateral facet arthrosis. Mild foraminal stenosis on the left. IMPRESSION: 1. Severe spinal canal stenosis and cord compression at C3-4 and C4-5 with possible subtle cord signal abnormality favored to reflect myelomalacia. 2. Moderate to severe spinal canal stenosis with cord compression at C5-6 with possible cord signal abnormality favoring myelomalacia. 3. Moderate spinal canal stenosis at C2-3 without cord signal abnormality. 4. 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 5. Significant foraminal stenosis at multiple levels as above. 6. No findings to suggest acute traumatic injury. Electronically signed by: Donnice Mania MD MD 01/04/2025 07:49 PM EST RP Workstation: [id number]        Scheduled Meds:  atorvastatin   80 mg Oral Daily   Chlorhexidine  Gluconate Cloth  6 each Topical Daily   doxycycline   100 mg Oral BID   enoxaparin  (LOVENOX ) injection  40 mg Subcutaneous Q24H   insulin  aspart  0-9 Units Subcutaneous Q4H   Continuous Infusions:   LOS: 1 day    Time spent: over 30 min     Meliton Monte, MD [facility]   To contact the attending provider between 7A-7P or the covering provider during after hours 7P-7A, please log into the web site [email or web address] and access using universal [place] password for that web site. If you do not have the password, please call the hospital operator.  01/06/2025, 1:56 PM    "

## 2025-01-06 NOTE — Evaluation (Addendum)
 Occupational Therapy Evaluation Patient Details Name: April Warren MRN: [id number] DOB: 12/12/1971 Today's Date: 01/06/2025   History of Present Illness   54 yo F adm to ED 01/04/25 s/p falls and increasing weakness x 2 days.  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 Neurosurgery consult with plans for C2-7 posterior laminectomy 01/09/25  (due to current respiratory infection and incr creatinine). PMH significant for diabetic retinopathy, HTN     Clinical Impressions PT admitted with OPLL with cord compression pending sx 01/09/25. Pt currently with functional limitiations due to the deficits listed below (see OT problem list). Pt currently with decreased bowel movements charted and could benefit from a bowel program. Recommendation for AIR after surgery. Pt with decreased UE fine motor and gross motor movement.  Pt will benefit from skilled OT to increase their independence and safety with adls and balance to allow discharge to intensive inpatient follow-up therapy, >3 hours/day . Please place order for post acute care follow up with SCI clinic     If plan is discharge home, recommend the following:   Two people to help with walking and/or transfers;Two people to help with bathing/dressing/bathroom     Functional Status Assessment   Patient has had a recent decline in their functional status and demonstrates the ability to make significant improvements in function in a reasonable and predictable amount of time.     Equipment Recommendations   Wheelchair (measurements OT);Wheelchair cushion (measurements OT);Hospital bed;Hoyer lift;BSC/3in1     Recommendations for Other Services   Rehab consult;Other (comment) (consult for cord involvement and spasticity)     Precautions/Restrictions   Precautions Precautions: Fall Precaution/Restrictions Comments:  cervical Restrictions Weight Bearing Restrictions Per Provider Order: No     Mobility Bed Mobility Overal bed mobility: Needs Assistance Bed Mobility: Rolling Rolling: Mod assist, Used rails         General bed mobility comments: rolls better toward L side and decreased grip strength so benefits from arm hooking    Transfers                   General transfer comment: not attempted at this time      Balance                                           ADL either performed or assessed with clinical judgement   ADL Overall ADL's : Needs assistance/impaired Eating/Feeding: Minimal assistance   Grooming: Minimal assistance   Upper Body Bathing: Moderate assistance   Lower Body Bathing: Maximal assistance   Upper Body Dressing : Moderate assistance   Lower Body Dressing: Maximal assistance                 General ADL Comments: pt incontinent of stool and unaware. pt reports knowing that she had urge but didnt know she had stool on her skin. educated about log rolling in the bed and hook elbow to elbow with R UE. pt with good return demo.     Vision Baseline Vision/History: 5 Retinopathy Ability to See in Adequate Light: 0 Adequate       Perception         Praxis         Pertinent Vitals/Pain Pain Assessment Pain Assessment: Faces Faces Pain Scale: Hurts little more Pain Location: L side spasms Pain Descriptors /  Indicators: Spasm Pain Intervention(s): Monitored during session, Repositioned     Extremity/Trunk Assessment Upper Extremity Assessment Upper Extremity Assessment: Generalized weakness;Right hand dominant;RUE deficits/detail;LUE deficits/detail RUE Deficits / Details: shoulder flexion to 90 present, decreased digit ROM and using 2nd digit in extension for task like call bell and phone. hand use is more hook grasp method-- will further assess post sx 01/09/25 RUE Sensation: decreased light touch RUE Coordination:  decreased fine motor;decreased gross motor LUE Deficits / Details: shoulder flexion to 90 present, decreased digit ROM and using 2nd digit in extension for task like call bell and phone. hand use is more hook grasp method-- will further assess post sx 01/09/25 LUE Sensation: decreased light touch LUE Coordination: decreased fine motor;decreased gross motor   Lower Extremity Assessment Lower Extremity Assessment: Defer to PT evaluation;LLE deficits/detail;RLE deficits/detail RLE Deficits / Details: demonstrates knee extension, hip flexion, knee flexion glute squeeze and ankle flexion LLE Deficits / Details: demonstrates decreased ankle flexion but initiates, squat activation present and decreased hip flexion   Cervical / Trunk Assessment Cervical / Trunk Assessment: Other exceptions Cervical / Trunk Exceptions: obesity   Communication Communication Communication: No apparent difficulties   Cognition Arousal: Alert Behavior During Therapy: WFL for tasks assessed/performed Cognition: No apparent impairments                               Following commands: Intact       Cueing  General Comments      pt could benefit from bowel program.   Exercises     Shoulder Instructions      Home Living Family/patient expects to be discharged to:: Private residence Living Arrangements: Spouse/significant other;Children Available Help at Discharge: Family;Available 24 hours/day Type of Home: House Home Access: Ramped entrance     Home Layout: One level     Bathroom Shower/Tub: Tub/shower unit;Curtain   Bathroom Toilet: Handicapped height Bathroom Accessibility: No   Home Equipment: Cane - single point;Wheelchair - manual          Prior Functioning/Environment Prior Level of Function : Independent/Modified Independent;History of Falls (last six months)             Mobility Comments: prior to christmas; 2 falls (one slip in shower, one step off curb); then  getting out of car fell backwards      OT Problem List: Decreased strength;Decreased range of motion;Decreased activity tolerance;Impaired balance (sitting and/or standing);Decreased safety awareness;Decreased knowledge of use of DME or AE;Decreased knowledge of precautions;Obesity;Impaired UE functional use;Pain;Impaired tone;Impaired sensation   OT Treatment/Interventions: Self-care/ADL training;Therapeutic exercise;Neuromuscular education;Energy conservation;DME and/or AE instruction;Manual therapy;Therapeutic activities;Patient/family education;Balance training;Modalities      OT Goals(Current goals can be found in the care plan section)   Acute Rehab OT Goals Patient Stated Goal: to get surgery OT Goal Formulation: With patient Time For Goal Achievement: 01/20/25 Potential to Achieve Goals: Good   OT Frequency:  Min 3X/week    Co-evaluation              AM-PAC OT 6 Clicks Daily Activity     Outcome Measure   Help from another person taking care of personal grooming?: A Lot Help from another person toileting, which includes using toliet, bedpan, or urinal?: Total Help from another person bathing (including washing, rinsing, drying)?: Total Help from another person to put on and taking off regular upper body clothing?: A Lot Help from another person to put on and taking off regular lower  body clothing?: Total 6 Click Score: 7   End of Session Nurse Communication: Mobility status;Precautions;Need for lift equipment  Activity Tolerance: Patient tolerated treatment well Patient left: in bed;with call bell/phone within reach;with bed alarm set  OT Visit Diagnosis: Unsteadiness on feet (R26.81);Muscle weakness (generalized) (M62.81)                Time: 8771-8755 OT Time Calculation (min): 16 min Charges:  OT General Charges $OT Visit: 1 Visit OT Evaluation $OT Eval Moderate Complexity: 1 Mod   Brynn, OTR/L  Acute Rehabilitation Services Office:  [phone] .   Ely Molt 01/06/2025, 1:15 PM

## 2025-01-06 NOTE — Progress Notes (Signed)
 Patient ID: April Warren, female   DOB: July 14, 1971, 54 y.o.   MRN: [id number] Planning surgery on Monday morning

## 2025-01-06 NOTE — Progress Notes (Addendum)
 Spoke with patient and husband at length about surgery Monday. They understood all the risks and benefits of surgery vs no surgery and they wish to proceed. Please make NPO after midnight Sunday and no Lovenox  Sunday or Monday.   We went over the imaging with him in detail.  We described the procedure in detail.  We talked about the advantages and disadvantages of the surgery and the risks benefits and alternatives.  He spoke specifically about the risk of neurologic compromise or spinal cord injury with intubation, positioning, decompression, or revascularization as well as other potential etiologies.  They have demonstrated understanding.  They wish to proceed.

## 2025-01-06 NOTE — Progress Notes (Signed)
 Inpatient Rehab Admissions Coordinator:   Per therapy recommendations pt was screened for CIR by Reche Lowers, PT, DPT.  Note plans for surgical intervention on 1/12.  We will follow up for post op therapy assessments.    Reche Lowers, PT, DPT Admissions Coordinator [phone] 01/06/2025 1:37 PM

## 2025-01-07 DIAGNOSIS — R531 Weakness: Secondary | ICD-10-CM | POA: Diagnosis not present

## 2025-01-07 LAB — GLUCOSE, CAPILLARY
Glucose-Capillary: 148 mg/dL — ABNORMAL HIGH (ref 70–99)
Glucose-Capillary: 162 mg/dL — ABNORMAL HIGH (ref 70–99)
Glucose-Capillary: 170 mg/dL — ABNORMAL HIGH (ref 70–99)
Glucose-Capillary: 242 mg/dL — ABNORMAL HIGH (ref 70–99)
Glucose-Capillary: 256 mg/dL — ABNORMAL HIGH (ref 70–99)
Glucose-Capillary: 302 mg/dL — ABNORMAL HIGH (ref 70–99)

## 2025-01-07 LAB — CBC
HCT: 36.8 % (ref 36.0–46.0)
Hemoglobin: 12.2 g/dL (ref 12.0–15.0)
MCH: 30.7 pg (ref 26.0–34.0)
MCHC: 33.2 g/dL (ref 30.0–36.0)
MCV: 92.7 fL (ref 80.0–100.0)
Platelets: 312 K/uL (ref 150–400)
RBC: 3.97 MIL/uL (ref 3.87–5.11)
RDW: 12.2 % (ref 11.5–15.5)
WBC: 8.7 K/uL (ref 4.0–10.5)
nRBC: 0 % (ref 0.0–0.2)

## 2025-01-07 LAB — BASIC METABOLIC PANEL WITH GFR
Anion gap: 12 (ref 5–15)
BUN: 29 mg/dL — ABNORMAL HIGH (ref 6–20)
CO2: 23 mmol/L (ref 22–32)
Calcium: 9.3 mg/dL (ref 8.9–10.3)
Chloride: 102 mmol/L (ref 98–111)
Creatinine, Ser: 1.06 mg/dL — ABNORMAL HIGH (ref 0.44–1.00)
GFR, Estimated: >60 mL/min
Glucose, Bld: 157 mg/dL — ABNORMAL HIGH (ref 70–99)
Potassium: 4.1 mmol/L (ref 3.5–5.1)
Sodium: 136 mmol/L (ref 135–145)

## 2025-01-07 LAB — PHOSPHORUS: Phosphorus: 2.5 mg/dL (ref 2.5–4.6)

## 2025-01-07 LAB — MAGNESIUM: Magnesium: 1.6 mg/dL — ABNORMAL LOW (ref 1.7–2.4)

## 2025-01-07 MED ORDER — MORPHINE SULFATE (PF) 2 MG/ML IV SOLN: 2.0000 mg | INTRAVENOUS | Status: DC | PRN

## 2025-01-07 MED ORDER — OXYCODONE HCL 5 MG PO TABS
10.0000 mg | ORAL_TABLET | ORAL | Status: DC | PRN
  Administered 2025-01-07 – 2025-01-09 (×5): 10 mg via ORAL
  Filled 2025-01-07 (×6): qty 2

## 2025-01-07 MED ORDER — DEXAMETHASONE SODIUM PHOSPHATE 4 MG/ML IJ SOLN
4.0000 mg | Freq: Four times a day (QID) | INTRAMUSCULAR | Status: AC
  Administered 2025-01-07 – 2025-01-09 (×8): 4 mg via INTRAVENOUS
  Filled 2025-01-07 (×8): qty 1

## 2025-01-07 NOTE — Progress Notes (Signed)
 " PROGRESS NOTE    April Warren  FMW:[id number] DOB: August 04, 1971 DOA: 01/04/2025 PCP: [name] LABOR, MD  Chief Complaint  Patient presents with   Fall    Brief Narrative:    April Warren is April Warren 54 y.o. female with medical history significant for hypertension, diabetes, CKD and other medical issues who presented with falls and worsening weakness.   Imaging showed findings concerning for spinal canal stenosis and cord compression of cervical spine.   Neurosurgery planning surgery sometime next week.     Assessment & Plan:   Principal Problem:   Left-sided weakness Active Problems:   Type 2 diabetes mellitus in patient with obesity (HCC)   HTN (hypertension)   Fall  Left-sided weakness, falls MRI with severe spinal canal stenosis and cord compression at C3-4 and C4-5 with possible subtle cord signal abnormality favored to reflect myelomalacia.  Moderate to severe spinal canal stenosis with cord compression at C5-6 with possible cord signal abnormality favoring myelomalacia.  Moderate spinal canal stenosis at C2-3 without cord signal abnormality.  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 (See report) Appreciate neurosurgery evaluation -  recommending C2-C7 posterior laminectomy with lateral mass fusion and fixation, plan for surgery 1/12.  Decadron  started 1/10. PT/OT   Recent acute bronchitis Negative covid, flu, RSV CT from 1/1 without acute intrathoracic pathology Doxycycline    Diarrhea She notes diarrhea commonly while on abx Monitor  Uncontrolled diabetes mellitus with hypoglycemia  A1c 8.1 08/2024 A1c pending Holding home oral meds SSI for now   Hypertension Holding metoprolol , lisinopril , triamterene -HCTZ.   CKD 3 [name]- creatinine 1.6 at presentation, baseline creatinine ranges between 1.1-1.5 trend  Urinary Retention Catheter in place since 1/8.    DVT prophylaxis: SCD Code Status: full Family Communication: husband at  bedside Disposition:   Status is: Observation The patient will require care spanning > 2 midnights and should be moved to inpatient because: need to continue inpatient care   Consultants:  neurosurgery  Procedures:  none  Antimicrobials:  Anti-infectives (From admission, onward)    Start     Dose/Rate Route Frequency Ordered Stop   01/04/25 2215  doxycycline  (VIBRA -TABS) tablet 100 mg        100 mg Oral 2 times daily 01/04/25 2127         Subjective: Husband at bedside Notes some more weakness in R hand today  Objective: Vitals:   01/07/25 0409 01/07/25 0716 01/07/25 1110 01/07/25 1515  BP: 139/68 (!) 150/73 (!) 138/57 (!) 131/54  Pulse: 62 83 63 69  Resp:  19 16 16   Temp: 98.2 F (36.8 C) 98 F (36.7 C) 98.4 F (36.9 C) 98.3 F (36.8 C)  TempSrc: Oral Oral Oral Oral  SpO2: 98% 98% 96% 95%  Weight:      Height:        Intake/Output Summary (Last 24 hours) at 01/07/2025 1626 Last data filed at 01/07/2025 0429 Gross per 24 hour  Intake 240 ml  Output 1050 ml  Net -810 ml   Filed Weights   01/04/25 1011  Weight: 124 kg    Examination:  General: No acute distress. Cardiovascular: RRR Lungs: unlabored Abdomen: Soft, nontender, nondistended Neurological: Alert and oriented 3. Moves all extremities 4 . Cranial nerves II through XII grossly intact. Skin: Warm and dry. No rashes or lesions. Extremities: No clubbing or cyanosis. No edema.   Data Reviewed: I have personally reviewed following labs and imaging studies  CBC: Recent Labs  Lab 01/04/25 1114 01/05/25 0231  01/06/25 0157 01/07/25 0154  WBC 9.0 9.1 7.6 8.7  NEUTROABS 6.0  --   --   --   HGB 11.5* 11.1* 11.4* 12.2  HCT 37.6 34.8* 35.1* 36.8  MCV 98.7 97.5 94.6 92.7  PLT 306 283 278 312    Basic Metabolic Panel: Recent Labs  Lab 01/04/25 1114 01/05/25 0231 01/06/25 0157 01/07/25 0154  NA 143 139 138 136  K 5.3* 4.6 4.1 4.1  CL 109 107 106 102  CO2 21* 24 23 23   GLUCOSE 74 82  145* 157*  BUN 51* 43* 29* 29*  CREATININE 1.69* 1.54* 1.07* 1.06*  CALCIUM  8.8* 8.5* 9.0 9.3  MG 2.0  --  1.6* 1.6*  PHOS  --   --  2.5 2.5    GFR: Estimated Creatinine Clearance: 85.5 mL/min (Donney Caraveo) (by C-G formula based on SCr of 1.06 mg/dL (H)).  Liver Function Tests: No results for input(s): AST, ALT, ALKPHOS, BILITOT, PROT, ALBUMIN in the last 168 hours.  CBG: Recent Labs  Lab 01/06/25 2356 01/07/25 0405 01/07/25 0721 01/07/25 1111 01/07/25 1517  GLUCAP 160* 162* 148* 170* 256*     Recent Results (from the past 240 hours)  Resp panel by RT-PCR (RSV, Flu Jawann Urbani&B, Covid) Anterior Nasal Swab     Status: None   Collection Time: 01/04/25 11:36 AM   Specimen: Anterior Nasal Swab  Result Value Ref Range Status   SARS Coronavirus 2 by RT PCR NEGATIVE NEGATIVE Final    Comment: (NOTE) SARS-CoV-2 target nucleic acids are NOT DETECTED.  The SARS-CoV-2 RNA is generally detectable in upper respiratory specimens during the acute phase of infection. The lowest concentration of SARS-CoV-2 viral copies this assay can detect is 138 copies/mL. Reagen Goates negative result does not preclude SARS-Cov-2 infection and should not be used as the sole basis for treatment or other patient management decisions. Sharon Stapel negative result may occur with  improper specimen collection/handling, submission of specimen other than nasopharyngeal swab, presence of viral mutation(s) within the areas targeted by this assay, and inadequate number of viral copies(<138 copies/mL). Peace Jost negative result must be combined with clinical observations, patient history, and epidemiological information. The expected result is Negative.  Fact Sheet for Patients:  bloggercourse.com  Fact Sheet for Healthcare Providers:  seriousbroker.it  This test is no t yet approved or cleared by the [place]  FDA and  has been authorized for detection and/or diagnosis of SARS-CoV-2  by FDA under an Emergency Use Authorization (EUA). This EUA will remain  in effect (meaning this test can be used) for the duration of the COVID-19 declaration under Section 564(b)(1) of the Act, 21 U.S.C.section 360bbb-3(b)(1), unless the authorization is terminated  or revoked sooner.       Influenza Jayvan Mcshan by PCR NEGATIVE NEGATIVE Final   Influenza B by PCR NEGATIVE NEGATIVE Final    Comment: (NOTE) The Xpert Xpress SARS-CoV-2/FLU/RSV plus assay is intended as an aid in the diagnosis of influenza from Nasopharyngeal swab specimens and should not be used as Cutberto Winfree sole basis for treatment. Nasal washings and aspirates are unacceptable for Xpert Xpress SARS-CoV-2/FLU/RSV testing.  Fact Sheet for Patients: bloggercourse.com  Fact Sheet for Healthcare Providers: seriousbroker.it  This test is not yet approved or cleared by the [place]  FDA and has been authorized for detection and/or diagnosis of SARS-CoV-2 by FDA under an Emergency Use Authorization (EUA). This EUA will remain in effect (meaning this test can be used) for the duration of the COVID-19 declaration under Section 564(b)(1) of the Act,  21 U.S.C. section 360bbb-3(b)(1), unless the authorization is terminated or revoked.     Resp Syncytial Virus by PCR NEGATIVE NEGATIVE Final    Comment: (NOTE) Fact Sheet for Patients: bloggercourse.com  Fact Sheet for Healthcare Providers: seriousbroker.it  This test is not yet approved or cleared by the [place]  FDA and has been authorized for detection and/or diagnosis of SARS-CoV-2 by FDA under an Emergency Use Authorization (EUA). This EUA will remain in effect (meaning this test can be used) for the duration of the COVID-19 declaration under Section 564(b)(1) of the Act, 21 U.S.C. section 360bbb-3(b)(1), unless the authorization is terminated or revoked.  Performed at  [facility], [address] [place], [place] [address]          Radiology Studies: No results found.       Scheduled Meds:  atorvastatin   80 mg Oral Daily   Chlorhexidine  Gluconate Cloth  6 each Topical Daily   dexamethasone  (DECADRON ) injection  4 mg Intravenous Q6H   doxycycline   100 mg Oral BID   insulin  aspart  0-9 Units Subcutaneous Q4H   Continuous Infusions:   LOS: 2 days    Time spent: over 30 min     Meliton Monte, MD [facility]   To contact the attending provider between 7A-7P or the covering provider during after hours 7P-7A, please log into the web site [email or web address] and access using universal [place] password for that web site. If you do not have the password, please call the hospital operator.  01/07/2025, 4:26 PM    "

## 2025-01-07 NOTE — Progress Notes (Signed)
" ° ° °  PROCEDURAL EXPEDITER PROGRESS NOTE  Patient Name: April Warren  DOB:[birth year] Date of Admission: 01/04/2025  Date of Assessment:01/07/2025   -------------------------------------------------------------------------------------------------------------------   Brief clinical summary: Patient going for a posterior laminectomy with lateral mass fusion and fixation on Monday, 1-12.  Orders in place:   Yes   Labs, test, and orders reviewed: yes  Requires surgical clearance:   No  What type of clearance: n/a  Clearance received: n/a  Barriers noted: need a surgical PCR order   Intervention provided by [facility] team: PCR order placed  Barrier resolved:   yes   -------------------------------------------------------------------------------------------------------------------  [employer], Rexene LITTIE Kirks Please contact us  directly via secure chat (search for [facility]) or by calling us  at [phone] [facility]).  "

## 2025-01-07 NOTE — Progress Notes (Signed)
 Patient ID: April Warren, female   DOB: 18-Jan-1971, 54 y.o.   MRN: [id number] Pt seen and examined, neuro exam unchanged but she has more pain in the neck and some tingling in the R hand today. Will start decadron  and add pain meds. Stop lovenox . Continue bed rest

## 2025-01-08 ENCOUNTER — Encounter: Payer: Self-pay | Admitting: Family Medicine

## 2025-01-08 DIAGNOSIS — R531 Weakness: Secondary | ICD-10-CM | POA: Diagnosis not present

## 2025-01-08 LAB — GLUCOSE, CAPILLARY
Glucose-Capillary: 225 mg/dL — ABNORMAL HIGH (ref 70–99)
Glucose-Capillary: 231 mg/dL — ABNORMAL HIGH (ref 70–99)
Glucose-Capillary: 325 mg/dL — ABNORMAL HIGH (ref 70–99)
Glucose-Capillary: 342 mg/dL — ABNORMAL HIGH (ref 70–99)
Glucose-Capillary: 348 mg/dL — ABNORMAL HIGH (ref 70–99)
Glucose-Capillary: 348 mg/dL — ABNORMAL HIGH (ref 70–99)

## 2025-01-08 LAB — CBC
HCT: 37.6 % (ref 36.0–46.0)
Hemoglobin: 12.7 g/dL (ref 12.0–15.0)
MCH: 31.2 pg (ref 26.0–34.0)
MCHC: 33.8 g/dL (ref 30.0–36.0)
MCV: 92.4 fL (ref 80.0–100.0)
Platelets: 317 K/uL (ref 150–400)
RBC: 4.07 MIL/uL (ref 3.87–5.11)
RDW: 12 % (ref 11.5–15.5)
WBC: 7.6 K/uL (ref 4.0–10.5)
nRBC: 0 % (ref 0.0–0.2)

## 2025-01-08 LAB — BASIC METABOLIC PANEL WITH GFR
Anion gap: 12 (ref 5–15)
BUN: 32 mg/dL — ABNORMAL HIGH (ref 6–20)
CO2: 22 mmol/L (ref 22–32)
Calcium: 9.6 mg/dL (ref 8.9–10.3)
Chloride: 102 mmol/L (ref 98–111)
Creatinine, Ser: 0.98 mg/dL (ref 0.44–1.00)
GFR, Estimated: >60 mL/min
Glucose, Bld: 226 mg/dL — ABNORMAL HIGH (ref 70–99)
Potassium: 4.5 mmol/L (ref 3.5–5.1)
Sodium: 136 mmol/L (ref 135–145)

## 2025-01-08 MED ORDER — LORAZEPAM 0.5 MG PO TABS
0.5000 mg | ORAL_TABLET | Freq: Two times a day (BID) | ORAL | Status: DC | PRN
  Administered 2025-01-08: 0.5 mg via ORAL
  Filled 2025-01-08: qty 1

## 2025-01-08 MED ORDER — CELECOXIB 200 MG PO CAPS
200.0000 mg | ORAL_CAPSULE | Freq: Once | ORAL | Status: AC
  Administered 2025-01-08: 200 mg via ORAL
  Filled 2025-01-08: qty 1

## 2025-01-08 MED ORDER — INSULIN GLARGINE 100 UNIT/ML ~~LOC~~ SOLN
10.0000 [IU] | SUBCUTANEOUS | Status: DC
  Administered 2025-01-08: 10 [IU] via SUBCUTANEOUS
  Filled 2025-01-08 (×3): qty 0.1

## 2025-01-08 MED ORDER — ACETAMINOPHEN 500 MG PO TABS
1000.0000 mg | ORAL_TABLET | Freq: Once | ORAL | Status: AC
  Administered 2025-01-08: 1000 mg via ORAL
  Filled 2025-01-08: qty 2

## 2025-01-08 MED ORDER — CHLORHEXIDINE GLUCONATE CLOTH 2 % EX PADS
6.0000 | MEDICATED_PAD | Freq: Once | CUTANEOUS | Status: AC
  Administered 2025-01-08: 6 via TOPICAL

## 2025-01-08 NOTE — Progress Notes (Addendum)
 " PROGRESS NOTE    April Warren  FMW:[id number] DOB: 10/11/1971 DOA: 01/04/2025 PCP: [name] LABOR, MD  Chief Complaint  Patient presents with   Fall    Brief Narrative:    April Warren is April Warren 54 y.o. female with medical history significant for hypertension, diabetes, CKD and other medical issues who presented with falls and worsening weakness.   Imaging showed findings concerning for spinal canal stenosis and cord compression of cervical spine.   Neurosurgery planning surgery sometime next week.     Assessment & Plan:   Principal Problem:   Left-sided weakness Active Problems:   Type 2 diabetes mellitus in patient with obesity (HCC)   HTN (hypertension)   Fall  Left-sided weakness, falls MRI with severe spinal canal stenosis and cord compression at C3-4 and C4-5 with possible subtle cord signal abnormality favored to reflect myelomalacia.  Moderate to severe spinal canal stenosis with cord compression at C5-6 with possible cord signal abnormality favoring myelomalacia.  Moderate spinal canal stenosis at C2-3 without cord signal abnormality.  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 (See report) Appreciate neurosurgery evaluation -  recommending C2-C7 posterior laminectomy with lateral mass fusion and fixation, plan for surgery 1/12.  Decadron  started 1/10. PT/OT   Recent acute bronchitis Negative covid, flu, RSV CT from 1/1 without acute intrathoracic pathology Doxycycline    Diarrhea She notes diarrhea commonly while on abx Monitor  Uncontrolled diabetes mellitus with hypoglycemia  A1c 8.1 08/2024 A1c pending Holding home oral meds SSI for now - basal given steroid induced hyperglycemia   Hypertension Holding metoprolol , lisinopril , triamterene -HCTZ.   CKD 3 April Warren- creatinine 1.6 at presentation, baseline creatinine ranges between 1.1-1.5 trend  Urinary Retention Catheter in place since 1/8.    DVT prophylaxis: SCD Code Status: full Family  Communication: husband at bedside Disposition:   Status is: Observation The patient will require care spanning > 2 midnights and should be moved to inpatient because: need to continue inpatient care   Consultants:  neurosurgery  Procedures:  none  Antimicrobials:  Anti-infectives (From admission, onward)    Start     Dose/Rate Route Frequency Ordered Stop   01/04/25 2215  doxycycline  (VIBRA -TABS) tablet 100 mg        100 mg Oral 2 times daily 01/04/25 2127         Subjective: No new complaints anxious  Objective: Vitals:   01/08/25 0333 01/08/25 0728 01/08/25 1209 01/08/25 1514  BP: 133/70 138/71 130/77 (!) 142/67  Pulse: (!) 57 (!) 55 75 79  Resp: 18     Temp: 97.8 F (36.6 C) 97.7 F (36.5 C) 98.9 F (37.2 C) 98.8 F (37.1 C)  TempSrc: Oral Oral Oral Oral  SpO2: 91% 97% 95% 97%  Weight:      Height:        Intake/Output Summary (Last 24 hours) at 01/08/2025 1632 Last data filed at 01/08/2025 0333 Gross per 24 hour  Intake 240 ml  Output 500 ml  Net -260 ml   Filed Weights   01/04/25 1011  Weight: 124 kg    Examination:  General: No acute distress. Cardiovascular: RRR Lungs: unlabored Neurological: Alert and oriented 3. Moves all extremities 4. L>R sided weakness. Extremities: No clubbing or cyanosis. No edema.   Data Reviewed: I have personally reviewed following labs and imaging studies  CBC: Recent Labs  Lab 01/04/25 1114 01/05/25 0231 01/06/25 0157 01/07/25 0154 01/08/25 0206  WBC 9.0 9.1 7.6 8.7 7.6  NEUTROABS 6.0  --   --   --   --  HGB 11.5* 11.1* 11.4* 12.2 12.7  HCT 37.6 34.8* 35.1* 36.8 37.6  MCV 98.7 97.5 94.6 92.7 92.4  PLT 306 283 278 312 317    Basic Metabolic Panel: Recent Labs  Lab 01/04/25 1114 01/05/25 0231 01/06/25 0157 01/07/25 0154 01/08/25 0206  NA 143 139 138 136 136  K 5.3* 4.6 4.1 4.1 4.5  CL 109 107 106 102 102  CO2 21* 24 23 23 22   GLUCOSE 74 82 145* 157* 226*  BUN 51* 43* 29* 29* 32*   CREATININE 1.69* 1.54* 1.07* 1.06* 0.98  CALCIUM  8.8* 8.5* 9.0 9.3 9.6  MG 2.0  --  1.6* 1.6*  --   PHOS  --   --  2.5 2.5  --     GFR: Estimated Creatinine Clearance: 92.5 mL/min (by C-G formula based on SCr of 0.98 mg/dL).  Liver Function Tests: No results for input(s): AST, ALT, ALKPHOS, BILITOT, PROT, ALBUMIN in the last 168 hours.  CBG: Recent Labs  Lab 01/07/25 2354 01/08/25 0331 01/08/25 0727 01/08/25 1205 01/08/25 1606  GLUCAP 242* 231* 225* 325* 342*     Recent Results (from the past 240 hours)  Resp panel by RT-PCR (RSV, Flu Duc Crocket&B, Covid) Anterior Nasal Swab     Status: None   Collection Time: 01/04/25 11:36 AM   Specimen: Anterior Nasal Swab  Result Value Ref Range Status   SARS Coronavirus 2 by RT PCR NEGATIVE NEGATIVE Final    Comment: (NOTE) SARS-CoV-2 target nucleic acids are NOT DETECTED.  The SARS-CoV-2 RNA is generally detectable in upper respiratory specimens during the acute phase of infection. The lowest concentration of SARS-CoV-2 viral copies this assay can detect is 138 copies/mL. April Warren negative result does not preclude SARS-Cov-2 infection and should not be used as the sole basis for treatment or other patient management decisions. April Warren negative result may occur with  improper specimen collection/handling, submission of specimen other than nasopharyngeal swab, presence of viral mutation(s) within the areas targeted by this assay, and inadequate number of viral copies(<138 copies/mL). April Warren negative result must be combined with clinical observations, patient history, and epidemiological information. The expected result is Negative.  Fact Sheet for Patients:  bloggercourse.com  Fact Sheet for Healthcare Providers:  seriousbroker.it  This test is no t yet approved or cleared by the [place]  FDA and  has been authorized for detection and/or diagnosis of SARS-CoV-2 by FDA under an  Emergency Use Authorization (EUA). This EUA will remain  in effect (meaning this test can be used) for the duration of the COVID-19 declaration under Section 564(b)(1) of the Act, 21 U.S.C.section 360bbb-3(b)(1), unless the authorization is terminated  or revoked sooner.       Influenza April Warren by PCR NEGATIVE NEGATIVE Final   Influenza B by PCR NEGATIVE NEGATIVE Final    Comment: (NOTE) The Xpert Xpress SARS-CoV-2/FLU/RSV plus assay is intended as an aid in the diagnosis of influenza from Nasopharyngeal swab specimens and should not be used as April Warren sole basis for treatment. Nasal washings and aspirates are unacceptable for Xpert Xpress SARS-CoV-2/FLU/RSV testing.  Fact Sheet for Patients: bloggercourse.com  Fact Sheet for Healthcare Providers: seriousbroker.it  This test is not yet approved or cleared by the [place]  FDA and has been authorized for detection and/or diagnosis of SARS-CoV-2 by FDA under an Emergency Use Authorization (EUA). This EUA will remain in effect (meaning this test can be used) for the duration of the COVID-19 declaration under Section 564(b)(1) of the Act, 21 U.S.C. section 360bbb-3(b)(1), unless  the authorization is terminated or revoked.     Resp Syncytial Virus by PCR NEGATIVE NEGATIVE Final    Comment: (NOTE) Fact Sheet for Patients: bloggercourse.com  Fact Sheet for Healthcare Providers: seriousbroker.it  This test is not yet approved or cleared by the [place]  FDA and has been authorized for detection and/or diagnosis of SARS-CoV-2 by FDA under an Emergency Use Authorization (EUA). This EUA will remain in effect (meaning this test can be used) for the duration of the COVID-19 declaration under Section 564(b)(1) of the Act, 21 U.S.C. section 360bbb-3(b)(1), unless the authorization is terminated or revoked.  Performed at [facility],  [address], [place], [place] [address]          Radiology Studies: No results found.       Scheduled Meds:  atorvastatin   80 mg Oral Daily   Chlorhexidine  Gluconate Cloth  6 each Topical Daily   dexamethasone  (DECADRON ) injection  4 mg Intravenous Q6H   doxycycline   100 mg Oral BID   insulin  aspart  0-9 Units Subcutaneous Q4H   Continuous Infusions:   LOS: 3 days    Time spent: over 30 min     April Monte, MD [facility]   To contact the attending provider between 7A-7P or the covering provider during after hours 7P-7A, please log into the web site [email or web address] and access using universal [place] password for that web site. If you do not have the password, please call the hospital operator.  01/08/2025, 4:32 PM    "

## 2025-01-08 NOTE — Progress Notes (Signed)
 Overall stable.  No change in her neurologic status.  Diffuse quadriparesis left worse than right.  Pain well-controlled.  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  Plan for posterior decompression and fusion surgery tomorrow per Dr. Joshua.

## 2025-01-08 NOTE — Progress Notes (Signed)
" °   01/08/25 1700  Spiritual Encounters  Type of Visit Initial  Care provided to: Patient  Conversation partners present during encounter Nurse  Referral source Patient request  Reason for visit Urgent spiritual support  OnCall Visit No  Spiritual Framework  Presenting Themes Courage hope and growth;Coping tools;Significant life change;Values and beliefs;Goals in life/care;Meaning/purpose/sources of inspiration;Rituals and practive;Impactful experiences and emotions  Values/beliefs  [employer] tradition of Faith)  Community/Connection Family;Friend(s);Significant other;Faith community  Patient Stress Factors Family relationships;Loss of control;Major life changes  Family Stress Factors Loss of control  Interventions  Spiritual Care Interventions Made Established relationship of care and support;Compassionate presence;Reflective listening;Decision-making support/facilitation;Normalization of emotions;Explored ethical dilemma;Narrative/life review;Meaning making;Prayer;Encouragement  Spiritual Care Plan  Spiritual Care Issues Still Outstanding Chaplain will continue to follow   Chaplain was called to bedside of pt this early evening.  She was filled with worry and anxiousness regarding her upcoming surgery.  Chaplain provided spiritual care and assessment - no existential distress shown - only normal worry and concern.  Provided spiritual care and comfort and encouragement and prayed at her request.  She requested chaplains to continue to see her tonight and tomrrow prior to surpery.  Ended visit with a departing blessing.   "

## 2025-01-09 ENCOUNTER — Encounter (HOSPITAL_COMMUNITY): Payer: Self-pay | Admitting: Internal Medicine

## 2025-01-09 ENCOUNTER — Encounter (HOSPITAL_COMMUNITY): Admission: EM | Disposition: A | Payer: Self-pay | Source: Home / Self Care | Attending: Family Medicine

## 2025-01-09 ENCOUNTER — Inpatient Hospital Stay (HOSPITAL_COMMUNITY)

## 2025-01-09 ENCOUNTER — Encounter (HOSPITAL_COMMUNITY): Admitting: Anesthesiology

## 2025-01-09 ENCOUNTER — Inpatient Hospital Stay (HOSPITAL_COMMUNITY): Admitting: Anesthesiology

## 2025-01-09 DIAGNOSIS — G992 Myelopathy in diseases classified elsewhere: Secondary | ICD-10-CM

## 2025-01-09 DIAGNOSIS — Z981 Arthrodesis status: Secondary | ICD-10-CM

## 2025-01-09 DIAGNOSIS — M4802 Spinal stenosis, cervical region: Secondary | ICD-10-CM

## 2025-01-09 DIAGNOSIS — J45909 Unspecified asthma, uncomplicated: Secondary | ICD-10-CM

## 2025-01-09 DIAGNOSIS — F419 Anxiety disorder, unspecified: Secondary | ICD-10-CM | POA: Diagnosis not present

## 2025-01-09 DIAGNOSIS — I10 Essential (primary) hypertension: Secondary | ICD-10-CM

## 2025-01-09 LAB — COMPREHENSIVE METABOLIC PANEL WITH GFR
ALT: 23 U/L (ref 0–44)
AST: 26 U/L (ref 15–41)
Albumin: 3.9 g/dL (ref 3.5–5.0)
Alkaline Phosphatase: 78 U/L (ref 38–126)
Anion gap: 13 (ref 5–15)
BUN: 52 mg/dL — ABNORMAL HIGH (ref 6–20)
CO2: 22 mmol/L (ref 22–32)
Calcium: 9.7 mg/dL (ref 8.9–10.3)
Chloride: 102 mmol/L (ref 98–111)
Creatinine, Ser: 1.49 mg/dL — ABNORMAL HIGH (ref 0.44–1.00)
GFR, Estimated: 41 mL/min — ABNORMAL LOW
Glucose, Bld: 257 mg/dL — ABNORMAL HIGH (ref 70–99)
Potassium: 4.7 mmol/L (ref 3.5–5.1)
Sodium: 137 mmol/L (ref 135–145)
Total Bilirubin: 0.5 mg/dL (ref 0.0–1.2)
Total Protein: 7 g/dL (ref 6.5–8.1)

## 2025-01-09 LAB — CBC WITH DIFFERENTIAL/PLATELET
Abs Immature Granulocytes: 0.04 K/uL (ref 0.00–0.07)
Basophils Absolute: 0 K/uL (ref 0.0–0.1)
Basophils Relative: 0 %
Eosinophils Absolute: 0 K/uL (ref 0.0–0.5)
Eosinophils Relative: 0 %
HCT: 37.5 % (ref 36.0–46.0)
Hemoglobin: 12.4 g/dL (ref 12.0–15.0)
Immature Granulocytes: 0 %
Lymphocytes Relative: 13 %
Lymphs Abs: 1.5 K/uL (ref 0.7–4.0)
MCH: 30.6 pg (ref 26.0–34.0)
MCHC: 33.1 g/dL (ref 30.0–36.0)
MCV: 92.6 fL (ref 80.0–100.0)
Monocytes Absolute: 0.7 K/uL (ref 0.1–1.0)
Monocytes Relative: 6 %
Neutro Abs: 8.8 K/uL — ABNORMAL HIGH (ref 1.7–7.7)
Neutrophils Relative %: 81 %
Platelets: 378 K/uL (ref 150–400)
RBC: 4.05 MIL/uL (ref 3.87–5.11)
RDW: 12 % (ref 11.5–15.5)
WBC: 10.9 K/uL — ABNORMAL HIGH (ref 4.0–10.5)
nRBC: 0 % (ref 0.0–0.2)

## 2025-01-09 LAB — TYPE AND SCREEN
ABO/RH(D): O POS
Antibody Screen: NEGATIVE

## 2025-01-09 LAB — GLUCOSE, CAPILLARY
Glucose-Capillary: 246 mg/dL — ABNORMAL HIGH (ref 70–99)
Glucose-Capillary: 265 mg/dL — ABNORMAL HIGH (ref 70–99)
Glucose-Capillary: 279 mg/dL — ABNORMAL HIGH (ref 70–99)
Glucose-Capillary: 204 mg/dL — ABNORMAL HIGH (ref 70–99)
Glucose-Capillary: 219 mg/dL — ABNORMAL HIGH (ref 70–99)
Glucose-Capillary: 279 mg/dL — ABNORMAL HIGH (ref 70–99)
Glucose-Capillary: 248 mg/dL — ABNORMAL HIGH (ref 70–99)
Glucose-Capillary: 274 mg/dL — ABNORMAL HIGH (ref 70–99)

## 2025-01-09 LAB — ABO/RH: ABO/RH(D): O POS

## 2025-01-09 LAB — SURGICAL PCR SCREEN
MRSA, PCR: NEGATIVE
Staphylococcus aureus: NEGATIVE

## 2025-01-09 LAB — PHOSPHORUS: Phosphorus: 3.2 mg/dL (ref 2.5–4.6)

## 2025-01-09 LAB — MAGNESIUM: Magnesium: 1.8 mg/dL (ref 1.7–2.4)

## 2025-01-09 MED ORDER — INSULIN ASPART 100 UNIT/ML IJ SOLN
0.0000 [IU] | INTRAMUSCULAR | Status: DC | PRN
  Administered 2025-01-09: 8 [IU] via SUBCUTANEOUS

## 2025-01-09 MED ORDER — MIDAZOLAM HCL (PF) 2 MG/2ML IJ SOLN
INTRAMUSCULAR | Status: DC | PRN
  Administered 2025-01-09: 2 mg via INTRAVENOUS

## 2025-01-09 MED ORDER — ROCURONIUM BROMIDE 10 MG/ML (PF) SYRINGE
PREFILLED_SYRINGE | INTRAVENOUS | Status: AC
  Filled 2025-01-09: qty 10

## 2025-01-09 MED ORDER — MEPERIDINE HCL 25 MG/ML IJ SOLN: 6.2500 mg | INTRAMUSCULAR | Status: DC | PRN

## 2025-01-09 MED ORDER — DEXAMETHASONE SOD PHOSPHATE PF 10 MG/ML IJ SOLN
INTRAMUSCULAR | Status: AC
  Filled 2025-01-09: qty 1

## 2025-01-09 MED ORDER — POTASSIUM CHLORIDE IN NACL 20-0.9 MEQ/L-% IV SOLN
INTRAVENOUS | Status: DC
  Filled 2025-01-09 (×3): qty 1000

## 2025-01-09 MED ORDER — FENTANYL CITRATE (PF) 250 MCG/5ML IJ SOLN
INTRAMUSCULAR | Status: DC | PRN
  Administered 2025-01-09: 50 ug via INTRAVENOUS
  Administered 2025-01-09: 100 ug via INTRAVENOUS
  Administered 2025-01-09 (×2): 50 ug via INTRAVENOUS

## 2025-01-09 MED ORDER — ONDANSETRON HCL 4 MG/2ML IJ SOLN
INTRAMUSCULAR | Status: AC
  Filled 2025-01-09: qty 2

## 2025-01-09 MED ORDER — ROCURONIUM BROMIDE 10 MG/ML (PF) SYRINGE
PREFILLED_SYRINGE | INTRAVENOUS | Status: DC | PRN
  Administered 2025-01-09: 20 mg via INTRAVENOUS
  Administered 2025-01-09: 70 mg via INTRAVENOUS
  Administered 2025-01-09 (×2): 30 mg via INTRAVENOUS

## 2025-01-09 MED ORDER — ACETAMINOPHEN 500 MG PO TABS
1000.0000 mg | ORAL_TABLET | Freq: Four times a day (QID) | ORAL | Status: AC
  Administered 2025-01-09 – 2025-01-10 (×4): 1000 mg via ORAL
  Filled 2025-01-09 (×4): qty 2

## 2025-01-09 MED ORDER — OXYCODONE HCL 5 MG/5ML PO SOLN: 5.0000 mg | Freq: Once | ORAL | Status: DC | PRN

## 2025-01-09 MED ORDER — BUPIVACAINE HCL (PF) 0.25 % IJ SOLN
INTRAMUSCULAR | Status: DC | PRN
  Administered 2025-01-09: 10 mL

## 2025-01-09 MED ORDER — DEXMEDETOMIDINE HCL IN NACL 80 MCG/20ML IV SOLN
INTRAVENOUS | Status: DC | PRN
  Administered 2025-01-09 (×2): 4 ug via INTRAVENOUS

## 2025-01-09 MED ORDER — PHENYLEPHRINE HCL-NACL 20-0.9 MG/250ML-% IV SOLN
INTRAVENOUS | Status: DC | PRN
  Administered 2025-01-09: 20 ug/min via INTRAVENOUS

## 2025-01-09 MED ORDER — CEFAZOLIN SODIUM-DEXTROSE 2-4 GM/100ML-% IV SOLN
2.0000 g | Freq: Three times a day (TID) | INTRAVENOUS | Status: AC
  Administered 2025-01-09 – 2025-01-10 (×2): 2 g via INTRAVENOUS
  Filled 2025-01-09 (×2): qty 100

## 2025-01-09 MED ORDER — FENTANYL CITRATE (PF) 100 MCG/2ML IJ SOLN
INTRAMUSCULAR | Status: AC
  Filled 2025-01-09: qty 2

## 2025-01-09 MED ORDER — LACTATED RINGERS IV SOLN: INTRAVENOUS | Status: DC

## 2025-01-09 MED ORDER — LIDOCAINE 2% (20 MG/ML) 5 ML SYRINGE
INTRAMUSCULAR | Status: AC
  Filled 2025-01-09: qty 5

## 2025-01-09 MED ORDER — ONDANSETRON HCL 4 MG/2ML IJ SOLN: 4.0000 mg | Freq: Four times a day (QID) | INTRAMUSCULAR | Status: DC | PRN

## 2025-01-09 MED ORDER — PROPOFOL 10 MG/ML IV BOLUS
INTRAVENOUS | Status: AC
  Filled 2025-01-09: qty 20

## 2025-01-09 MED ORDER — BUPIVACAINE HCL (PF) 0.25 % IJ SOLN
INTRAMUSCULAR | Status: AC
  Filled 2025-01-09: qty 30

## 2025-01-09 MED ORDER — METHOCARBAMOL 500 MG PO TABS
500.0000 mg | ORAL_TABLET | Freq: Four times a day (QID) | ORAL | Status: DC | PRN
  Administered 2025-01-09 – 2025-01-12 (×5): 500 mg via ORAL
  Filled 2025-01-09 (×5): qty 1

## 2025-01-09 MED ORDER — SODIUM CHLORIDE 0.9% FLUSH: 3.0000 mL | INTRAVENOUS | Status: DC | PRN

## 2025-01-09 MED ORDER — ONDANSETRON HCL 4 MG/2ML IJ SOLN
INTRAMUSCULAR | Status: DC | PRN
  Administered 2025-01-09: 4 mg via INTRAVENOUS

## 2025-01-09 MED ORDER — DEXAMETHASONE SOD PHOSPHATE PF 10 MG/ML IJ SOLN
INTRAMUSCULAR | Status: DC | PRN
  Administered 2025-01-09: 10 mg via INTRAVENOUS

## 2025-01-09 MED ORDER — PHENYLEPHRINE 80 MCG/ML (10ML) SYRINGE FOR IV PUSH (FOR BLOOD PRESSURE SUPPORT)
PREFILLED_SYRINGE | INTRAVENOUS | Status: AC
  Filled 2025-01-09: qty 10

## 2025-01-09 MED ORDER — THROMBIN 5000 UNITS EX KIT
PACK | CUTANEOUS | Status: AC
  Filled 2025-01-09: qty 1

## 2025-01-09 MED ORDER — SUGAMMADEX SODIUM 200 MG/2ML IV SOLN
INTRAVENOUS | Status: DC | PRN
  Administered 2025-01-09: 300 mg via INTRAVENOUS

## 2025-01-09 MED ORDER — CHLORHEXIDINE GLUCONATE 0.12 % MT SOLN: 15.0000 mL | Freq: Once | OROMUCOSAL | Status: AC

## 2025-01-09 MED ORDER — KETAMINE HCL 50 MG/5ML IJ SOSY
PREFILLED_SYRINGE | INTRAMUSCULAR | Status: AC
  Filled 2025-01-09: qty 5

## 2025-01-09 MED ORDER — HYDROMORPHONE HCL 1 MG/ML IJ SOLN
0.5000 mg | INTRAMUSCULAR | Status: DC | PRN
  Administered 2025-01-09: 0.5 mg via INTRAVENOUS
  Filled 2025-01-09: qty 0.5

## 2025-01-09 MED ORDER — PHENYLEPHRINE HCL-NACL 20-0.9 MG/250ML-% IV SOLN
INTRAVENOUS | Status: AC
  Filled 2025-01-09: qty 250

## 2025-01-09 MED ORDER — SURGIPHOR WOUND IRRIGATION SYSTEM - OPTIME
TOPICAL | Status: DC | PRN
  Administered 2025-01-09: 450 mL via TOPICAL

## 2025-01-09 MED ORDER — FENTANYL CITRATE (PF) 100 MCG/2ML IJ SOLN
25.0000 ug | INTRAMUSCULAR | Status: DC | PRN
  Administered 2025-01-09: 25 ug via INTRAVENOUS
  Administered 2025-01-09 (×2): 50 ug via INTRAVENOUS
  Administered 2025-01-09: 25 ug via INTRAVENOUS

## 2025-01-09 MED ORDER — THROMBIN 20000 UNITS EX SOLR
CUTANEOUS | Status: AC
  Filled 2025-01-09: qty 20000

## 2025-01-09 MED ORDER — SODIUM CHLORIDE 0.9 % IV SOLN: 250.0000 mL | INTRAVENOUS | Status: AC

## 2025-01-09 MED ORDER — ONDANSETRON HCL 4 MG/2ML IJ SOLN: 4.0000 mg | Freq: Once | INTRAMUSCULAR | Status: DC | PRN

## 2025-01-09 MED ORDER — 0.9 % SODIUM CHLORIDE (POUR BTL) OPTIME
TOPICAL | Status: DC | PRN
  Administered 2025-01-09: 1000 mL

## 2025-01-09 MED ORDER — DEXAMETHASONE SODIUM PHOSPHATE 4 MG/ML IJ SOLN
4.0000 mg | Freq: Four times a day (QID) | INTRAMUSCULAR | Status: DC
  Administered 2025-01-09: 4 mg via INTRAVENOUS
  Filled 2025-01-09: qty 1

## 2025-01-09 MED ORDER — VANCOMYCIN HCL 1000 MG IV SOLR
INTRAVENOUS | Status: AC
  Filled 2025-01-09: qty 20

## 2025-01-09 MED ORDER — FENTANYL CITRATE (PF) 250 MCG/5ML IJ SOLN
INTRAMUSCULAR | Status: AC
  Filled 2025-01-09: qty 5

## 2025-01-09 MED ORDER — CEFAZOLIN SODIUM-DEXTROSE 2-4 GM/100ML-% IV SOLN
2.0000 g | INTRAVENOUS | Status: AC
  Administered 2025-01-09: 3 g via INTRAVENOUS
  Filled 2025-01-09: qty 100

## 2025-01-09 MED ORDER — KETAMINE HCL 10 MG/ML IJ SOLN
INTRAMUSCULAR | Status: DC | PRN
  Administered 2025-01-09 (×2): 25 mg via INTRAVENOUS

## 2025-01-09 MED ORDER — LIDOCAINE 2% (20 MG/ML) 5 ML SYRINGE
INTRAMUSCULAR | Status: DC | PRN
  Administered 2025-01-09: 100 mg via INTRAVENOUS

## 2025-01-09 MED ORDER — ORAL CARE MOUTH RINSE: 15.0000 mL | Freq: Once | OROMUCOSAL | Status: AC

## 2025-01-09 MED ORDER — MENTHOL 3 MG MT LOZG: 1.0000 | LOZENGE | OROMUCOSAL | Status: DC | PRN

## 2025-01-09 MED ORDER — CHLORHEXIDINE GLUCONATE 0.12 % MT SOLN
OROMUCOSAL | Status: AC
  Administered 2025-01-09: 15 mL via OROMUCOSAL
  Filled 2025-01-09: qty 15

## 2025-01-09 MED ORDER — THROMBIN 20000 UNITS EX SOLR
CUTANEOUS | Status: DC | PRN
  Administered 2025-01-09: 20 mL via TOPICAL

## 2025-01-09 MED ORDER — ONDANSETRON HCL 4 MG PO TABS: 4.0000 mg | ORAL_TABLET | Freq: Four times a day (QID) | ORAL | Status: DC | PRN

## 2025-01-09 MED ORDER — INSULIN ASPART 100 UNIT/ML IJ SOLN
INTRAMUSCULAR | Status: AC
  Administered 2025-01-10: 4 [IU] via SUBCUTANEOUS
  Filled 2025-01-09: qty 8

## 2025-01-09 MED ORDER — VECURONIUM BROMIDE 10 MG IV SOLR
INTRAVENOUS | Status: DC | PRN
  Administered 2025-01-09: 3 mg via INTRAVENOUS
  Administered 2025-01-09: 2 mg via INTRAVENOUS

## 2025-01-09 MED ORDER — PROPOFOL 10 MG/ML IV BOLUS
INTRAVENOUS | Status: DC | PRN
  Administered 2025-01-09: 100 mg via INTRAVENOUS
  Administered 2025-01-09: 200 mg via INTRAVENOUS

## 2025-01-09 MED ORDER — DEXAMETHASONE 4 MG PO TABS
4.0000 mg | ORAL_TABLET | Freq: Four times a day (QID) | ORAL | Status: DC
  Administered 2025-01-10 – 2025-01-11 (×6): 4 mg via ORAL
  Filled 2025-01-09 (×6): qty 1

## 2025-01-09 MED ORDER — SENNA 8.6 MG PO TABS
1.0000 | ORAL_TABLET | Freq: Two times a day (BID) | ORAL | Status: DC
  Administered 2025-01-09 – 2025-01-12 (×6): 8.6 mg via ORAL
  Filled 2025-01-09 (×6): qty 1

## 2025-01-09 MED ORDER — ACETAMINOPHEN 500 MG PO TABS
1000.0000 mg | ORAL_TABLET | ORAL | Status: AC
  Administered 2025-01-09: 1000 mg via ORAL
  Filled 2025-01-09: qty 2

## 2025-01-09 MED ORDER — OXYCODONE HCL 5 MG PO TABS: 5.0000 mg | ORAL_TABLET | Freq: Once | ORAL | Status: DC | PRN

## 2025-01-09 MED ORDER — CEFAZOLIN SODIUM 1 G IJ SOLR
INTRAMUSCULAR | Status: AC
  Filled 2025-01-09: qty 10

## 2025-01-09 MED ORDER — SODIUM CHLORIDE 0.9% FLUSH
3.0000 mL | Freq: Two times a day (BID) | INTRAVENOUS | Status: DC
  Administered 2025-01-09 – 2025-01-12 (×3): 3 mL via INTRAVENOUS

## 2025-01-09 MED ORDER — PHENOL 1.4 % MT LIQD: 1.0000 | OROMUCOSAL | Status: DC | PRN

## 2025-01-09 MED ORDER — GABAPENTIN 300 MG PO CAPS
300.0000 mg | ORAL_CAPSULE | ORAL | Status: AC
  Administered 2025-01-09: 300 mg via ORAL
  Filled 2025-01-09: qty 1

## 2025-01-09 MED ORDER — METHOCARBAMOL 1000 MG/10ML IJ SOLN: 500.0000 mg | Freq: Four times a day (QID) | INTRAMUSCULAR | Status: DC | PRN

## 2025-01-09 MED ORDER — OXYCODONE HCL 5 MG PO TABS
10.0000 mg | ORAL_TABLET | ORAL | Status: DC | PRN
  Administered 2025-01-09 – 2025-01-12 (×15): 10 mg via ORAL
  Filled 2025-01-09 (×16): qty 2

## 2025-01-09 MED ORDER — THROMBIN 5000 UNITS EX SOLR
OROMUCOSAL | Status: DC | PRN
  Administered 2025-01-09: 5 mL via TOPICAL

## 2025-01-09 NOTE — Anesthesia Procedure Notes (Signed)
 Procedure Name: Intubation Date/Time: 01/09/2025 12:56 PM  Performed by: Hedy Jarred, CRNAPre-anesthesia Checklist: Patient identified, Emergency Drugs available, Suction available and Patient being monitored Patient Re-evaluated:Patient Re-evaluated prior to induction Oxygen Delivery Method: Circle System Utilized Preoxygenation: Pre-oxygenation with 100% oxygen Induction Type: IV induction Ventilation: Mask ventilation without difficulty Laryngoscope Size: Glidescope and 3 Grade View: Grade I Tube type: Oral Tube size: 7.5 mm Number of attempts: 1 Airway Equipment and Method: Stylet and Oral airway Placement Confirmation: ETT inserted through vocal cords under direct vision, positive ETCO2 and breath sounds checked- equal and bilateral Secured at: 22 cm Tube secured with: Tape Dental Injury: Teeth and Oropharynx as per pre-operative assessment

## 2025-01-09 NOTE — Progress Notes (Signed)
" ° ° °  PROCEDURAL EXPEDITER PROGRESS NOTE  Patient Name: April Warren  DOB:08/31/1971 Date of Admission: 01/04/2025  Date of Assessment:01/09/2025   -------------------------------------------------------------------------------------------------------------------   Brief clinical summary: Pt  having a  surgery today Posterior cervical fusion/foraminotomy level 5   Orders in place:  Yes   Communication with surgical team if no orders: n/a  Labs, test, and orders reviewed: Y  Requires surgical clearance:  No  What type of clearance: n/a  Clearance received: n/a  Barriers noted:n/a   Intervention provided by [facility] team: n/a  Barrier resolved:  not applicable   -------------------------------------------------------------------------------------------------------------------  [employer], April Warren Please contact us  directly via secure chat (search for [facility]) or by calling us  at [phone] [facility]).  "

## 2025-01-09 NOTE — Progress Notes (Signed)
 " PROGRESS NOTE    April Warren  FMW:[id number] DOB: [birth year] DOA: 01/04/2025 PCP: [name] LABOR, MD  Chief Complaint  Patient presents with   Fall    Brief Narrative:    April Warren is April Warren 54 y.o. female with medical history significant for hypertension, diabetes, CKD and other medical issues who presented with falls and worsening weakness.   Imaging showed findings concerning for spinal canal stenosis and cord compression of cervical spine.   Neurosurgery planning surgery sometime next week.     Assessment & Plan:   Principal Problem:   Left-sided weakness Active Problems:   Type 2 diabetes mellitus in patient with obesity (HCC)   HTN (hypertension)   Fall  Left-sided weakness, falls MRI with severe spinal canal stenosis and cord compression at C3-4 and C4-5 with possible subtle cord signal abnormality favored to reflect myelomalacia.  Moderate to severe spinal canal stenosis with cord compression at C5-6 with possible cord signal abnormality favoring myelomalacia.  Moderate spinal canal stenosis at C2-3 without cord signal abnormality.  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 (See report) Appreciate neurosurgery evaluation  Now s/p surgery - decompressive cervical laminectomy medial facetectomy and foraminotomies C2-3, C3-4, C4-5, C5-6.  Posterior lateral arthrodesis c2-3, C3-4, C4-5, C5-6, C6-7.  Segmental lateral mass fixation C2-C7.  1/12. PT/OT   Recent acute bronchitis Negative covid, flu, RSV CT from 1/1 without acute intrathoracic pathology Doxycycline    Diarrhea She notes diarrhea commonly while on abx Monitor  Uncontrolled diabetes mellitus with hypoglycemia  A1c 8.1 08/2024 A1c 7.1 Holding home oral meds SSI for now - basal given steroid induced hyperglycemia   Hypertension Holding metoprolol , lisinopril , triamterene -HCTZ.   CKD 3 [name]- creatinine 1.6 at presentation, baseline creatinine ranges between 1.1-1.5 Trend - uptrend  today, follow   Urinary Retention Catheter in place since 1/8.    DVT prophylaxis: SCD Code Status: full Family Communication: husband at bedside Disposition:   Status is: Observation The patient will require care spanning > 2 midnights and should be moved to inpatient because: need to continue inpatient care   Consultants:  neurosurgery  Procedures:  none  Antimicrobials:  Anti-infectives (From admission, onward)    Start     Dose/Rate Route Frequency Ordered Stop   01/09/25 1100  ceFAZolin  (ANCEF ) IVPB 2g/100 mL premix        2 g 200 mL/hr over 30 Minutes Intravenous On call to O.R. 01/09/25 1013 01/09/25 1332   01/04/25 2215  [MAR Hold]  doxycycline  (VIBRA -TABS) tablet 100 mg        (MAR Hold since Mon 01/09/2025 at 1058.Hold Reason: Transfer to [name] Procedural area)   100 mg Oral 2 times daily 01/04/25 2127         Subjective: No new complaints  Objective: Vitals:   01/09/25 0318 01/09/25 1057 01/09/25 1632 01/09/25 1645  BP: 123/67 (!) 157/68 139/65 134/62  Pulse: (!) 53 68 80 78  Resp: 18 17 20 13   Temp: 97.7 F (36.5 C) 97.6 F (36.4 C) 98.5 F (36.9 C)   TempSrc: Oral Oral    SpO2: 97% 93% 98% 97%  Weight:  124 kg    Height:  5' 9 (1.753 m)      Intake/Output Summary (Last 24 hours) at 01/09/2025 1727 Last data filed at 01/09/2025 1625 Gross per 24 hour  Intake 2040 ml  Output 600 ml  Net 1440 ml   Filed Weights   01/04/25 1011 01/09/25 1057  Weight: 124 kg 124 kg  Examination:  General: No acute distress. Cardiovascular: RRR Lungs: unlabored Neurological: lethargic post op, moving all extremities Extremities: No clubbing or cyanosis. No edema.   Data Reviewed: I have personally reviewed following labs and imaging studies  CBC: Recent Labs  Lab 01/04/25 1114 01/05/25 0231 01/06/25 0157 01/07/25 0154 01/08/25 0206 01/09/25 0134  WBC 9.0 9.1 7.6 8.7 7.6 10.9*  NEUTROABS 6.0  --   --   --   --  8.8*  HGB 11.5* 11.1* 11.4* 12.2  12.7 12.4  HCT 37.6 34.8* 35.1* 36.8 37.6 37.5  MCV 98.7 97.5 94.6 92.7 92.4 92.6  PLT 306 283 278 312 317 378    Basic Metabolic Panel: Recent Labs  Lab 01/04/25 1114 01/05/25 0231 01/06/25 0157 01/07/25 0154 01/08/25 0206 01/09/25 0134  NA 143 139 138 136 136 137  K 5.3* 4.6 4.1 4.1 4.5 4.7  CL 109 107 106 102 102 102  CO2 21* 24 23 23 22 22   GLUCOSE 74 82 145* 157* 226* 257*  BUN 51* 43* 29* 29* 32* 52*  CREATININE 1.69* 1.54* 1.07* 1.06* 0.98 1.49*  CALCIUM  8.8* 8.5* 9.0 9.3 9.6 9.7  MG 2.0  --  1.6* 1.6*  --  1.8  PHOS  --   --  2.5 2.5  --  3.2    GFR: Estimated Creatinine Clearance: 60.8 mL/min ([name]) (by C-G formula based on SCr of 1.49 mg/dL (H)).  Liver Function Tests: Recent Labs  Lab 01/09/25 0134  AST 26  ALT 23  ALKPHOS 78  BILITOT 0.5  PROT 7.0  ALBUMIN 3.9    CBG: Recent Labs  Lab 01/09/25 0352 01/09/25 0750 01/09/25 1100 01/09/25 1431 01/09/25 1636  GLUCAP 246* 265* 279* 204* 219*     Recent Results (from the past 240 hours)  Resp panel by RT-PCR (RSV, Flu [name], Covid) Anterior Nasal Swab     Status: None   Collection Time: 01/04/25 11:36 AM   Specimen: Anterior Nasal Swab  Result Value Ref Range Status   SARS Coronavirus 2 by RT PCR NEGATIVE NEGATIVE Final    Comment: (NOTE) SARS-CoV-2 target nucleic acids are NOT DETECTED.  The SARS-CoV-2 RNA is generally detectable in upper respiratory specimens during the acute phase of infection. The lowest concentration of SARS-CoV-2 viral copies this assay can detect is 138 copies/mL. [name] does not preclude SARS-Cov-2 infection and should not be used as the sole basis for treatment or other patient management decisions. [name] negative result may occur with  improper specimen collection/handling, submission of specimen other than nasopharyngeal swab, presence of viral mutation(s) within the areas targeted by this assay, and inadequate number of viral copies(<138 copies/mL). [name] negative  result must be combined with clinical observations, patient history, and epidemiological information. The expected result is Negative.  Fact Sheet for Patients:  bloggercourse.com  Fact Sheet for Healthcare Providers:  seriousbroker.it  This test is no t yet approved or cleared by the [place]  FDA and  has been authorized for detection and/or diagnosis of SARS-CoV-2 by FDA under an Emergency Use Authorization (EUA). This EUA will remain  in effect (meaning this test can be used) for the duration of the COVID-19 declaration under Section 564(b)(1) of the Act, 21 U.S.C.section 360bbb-3(b)(1), unless the authorization is terminated  or revoked sooner.       Influenza Marycatherine Maniscalco by PCR NEGATIVE NEGATIVE Final   Influenza B by PCR NEGATIVE NEGATIVE Final    Comment: (NOTE) The Xpert Xpress SARS-CoV-2/FLU/RSV plus assay is intended  as an aid in the diagnosis of influenza from Nasopharyngeal swab specimens and should not be used as Seba Madole sole basis for treatment. Nasal washings and aspirates are unacceptable for Xpert Xpress SARS-CoV-2/FLU/RSV testing.  Fact Sheet for Patients: bloggercourse.com  Fact Sheet for Healthcare Providers: seriousbroker.it  This test is not yet approved or cleared by the [place]  FDA and has been authorized for detection and/or diagnosis of SARS-CoV-2 by FDA under an Emergency Use Authorization (EUA). This EUA will remain in effect (meaning this test can be used) for the duration of the COVID-19 declaration under Section 564(b)(1) of the Act, 21 U.S.C. section 360bbb-3(b)(1), unless the authorization is terminated or revoked.     Resp Syncytial Virus by PCR NEGATIVE NEGATIVE Final    Comment: (NOTE) Fact Sheet for Patients: bloggercourse.com  Fact Sheet for Healthcare Providers: seriousbroker.it  This  test is not yet approved or cleared by the [place]  FDA and has been authorized for detection and/or diagnosis of SARS-CoV-2 by FDA under an Emergency Use Authorization (EUA). This EUA will remain in effect (meaning this test can be used) for the duration of the COVID-19 declaration under Section 564(b)(1) of the Act, 21 U.S.C. section 360bbb-3(b)(1), unless the authorization is terminated or revoked.  Performed at [facility], [address] [place], [place] [address]   Surgical pcr screen     Status: None   Collection Time: 01/07/25  5:17 PM   Specimen: Nasal Mucosa; Nasal Swab  Result Value Ref Range Status   MRSA, PCR NEGATIVE NEGATIVE Final   Staphylococcus aureus NEGATIVE NEGATIVE Final    Comment: (NOTE) The Xpert SA Assay (FDA approved for NASAL specimens in patients 12 years of age and older), is one component of [facility] comprehensive surveillance program. It is not intended to diagnose infection nor to guide or monitor treatment. Performed at [facility], [address], [place], [place] [address]          Radiology Studies: DG C-Arm 1-60 Min-No Report Result Date: 01/09/2025 Fluoroscopy was utilized by the requesting physician.  No radiographic interpretation.   DG C-Arm 1-60 Min-No Report Result Date: 01/09/2025 Fluoroscopy was utilized by the requesting physician.  No radiographic interpretation.   DG C-Arm 1-60 Min-No Report Result Date: 01/09/2025 Fluoroscopy was utilized by the requesting physician.  No radiographic interpretation.         Scheduled Meds:  [MAR Hold] atorvastatin   80 mg Oral Daily   [MAR Hold] Chlorhexidine  Gluconate Cloth  6 each Topical Daily   [MAR Hold] doxycycline   100 mg Oral BID   insulin  aspart       [MAR Hold] insulin  aspart  0-9 Units Subcutaneous Q4H   [MAR Hold] insulin  glargine  10 Units Subcutaneous Q24H   Continuous Infusions:  lactated ringers  10 mL/hr at 01/09/25 1242     LOS: 4 days    Time spent:  over 30 min     Meliton Monte, MD [facility]   To contact the attending provider between 7A-7P or the covering provider during after hours 7P-7A, please log into the web site [email or web address] and access using universal [place] password for that web site. If you do not have the password, please call the hospital operator.  01/09/2025, 5:27 PM    "

## 2025-01-09 NOTE — Progress Notes (Signed)
 Patient ID: April Warren, female   DOB: March 10, 1971, 54 y.o.   MRN: [id number] Patient seen and examined.  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  Questions have been encouraged and answered.  She understands risks benefits and alternatives and wishes to proceed.

## 2025-01-09 NOTE — Op Note (Signed)
 01/09/2025  4:25 PM  PATIENT:  April Warren  54 y.o. 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  Decompressive cervical laminectomy medial facetectomy and foraminotomies C2-3 C3-4 C4-5 C5-6, 2.  Posterior lateral arthrodesis C2-3 C3-4 C4-5 C5-6 C6-7 utilizing locally harvested morselized autologous bone graft and morselized allograft, 3.  Segmental lateral mass fixation C2-C7 utilizing ATEC lateral mass screws  SURGEON:  Alm Molt, MD  ASSISTANTS: Suzen Pean, FNP  ANESTHESIA:   General  EBL: 200 ml  Total I/O In: -  Out: 600 [Urine:400; Blood:200]  BLOOD ADMINISTERED: none  DRAINS: Medium Hemovac  SPECIMEN:  none  INDICATION FOR PROCEDURE: This patient presented with an acute spinal cord injury on top of chronic myelopathy after a fall. 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  Recommended decompressive laminectomy and instrumented fusion C2-C7. Patient understood the risks, benefits, and alternatives and potential outcomes and wished to proceed.  PROCEDURE DETAILS: The patient was brought to the operating room. Generalized endotracheal anesthesia was induced lysing the GlideScope.  The patient was affixed a 3 point [place] headrest and rolled into the prone position on chest rolls. All pressure points were padded.  Body habitus made positioning difficult but we were able to keep her neck fairly neutral to slightly flexed.  Intraoperative fluoroscopy confirmed this when she was positioned.  The posterior cervical region was cleaned with Betadine scrub and prepped with DuraPrep and then draped in the usual sterile fashion. 7 cc of local anesthesia was injected and a dorsal midline incision made in the  posterior cervical region and carried down to the cervical fascia. The fascia was opened and the paraspinous musculature was taken down to expose C2-C7. Intraoperative fluoroscopy confirmed my level and then the dissection was carried out over the lateral facets. I localized the midpoint of each lateral mass and marked a region 1 mm medial to the midpoint of the lateral mass, and then drilled in an upward and outward direction into the safe zone of each lateral mass. I drilled to a depth of 14 mm and then checked my drill hole with a ball probe. I then placed a 14 mm lateral mass screws into the safe zone of each lateral mass C3-C7 until they were 2 fingers tight. I then gently decompressed the central canal with the 1 and 2 mm Kerrison punch from C2-3 to C5-6.  We used a high-speed drill to drill the lamina down to an eggshell and then we were able to complete the laminectomies with a 2 mm gold Kerrison punch.  Great care was taken to be very gentle with the decompression.  Medial facetectomies were performed, and foraminotomies were performed at C4-5 C5-6 and C3-4. Once the decompression was complete the dura was full and capacious and I could see the spinal cord pulsatile through the dura.  We then marked our entry zones for our C2 pars screws.  Utilizing lateral fluoroscopy we drilled to a depth of 14 mm, palpated with a ball probe, and placed 18 mm pars screws at C2.  I then decorticated the lateral masses and the facet joints and packed them with local autograft and morcellized allograft to perform arthrodesis from C2-C7. I then placed rods into the multiaxial screw heads of the screws  and locked these into position with the locking caps and anti-torque device. I then checked the final construct with AP/Lat fluoroscopy. I irrigated with 0.5% povidone iodine solution followed by saline solution. I placed a medium Hemovac drain through separate stab incision, and lined the dura with Gelfoam. After hemostasis was  achieved I closed the muscle and the fascia with 0 Vicryl, subcutaneous tissue with 2-0 Vicryl, and the subcuticular tissue with 3-0 Vicryl. The skin was closed with Dermabond, benzoin and Steri-Strips. A sterile dressing was applied, the patient was turned to the supine position and taken out of the headrest, awakened from general anesthesia and transferred to the recovery room in stable condition. At the end of the procedure all sponge, needle and instrument counts were correct.    PLAN OF CARE: Admit to inpatient   PATIENT DISPOSITION:  PACU - hemodynamically stable.   Delay start of Pharmacological VTE agent (>24hrs) due to surgical blood loss or risk of bleeding:  yes

## 2025-01-09 NOTE — TOC Progression Note (Signed)
 Transition of Care [facility]) - Progression Note    Patient Details  Name: April Warren MRN: [id number] Date of Birth: 1971-11-25  [facility] CM/SW Contact  Andrez JULIANNA George, RN Phone Number: 01/09/2025, 9:40 AM  Clinical Narrative:     Plan: for posterior decompression and fusion surgery today.  IP Care management following.  Expected Discharge Plan: IP Rehab Facility Barriers to Discharge: Continued Medical Work up               Expected Discharge Plan and Services   Discharge Planning Services: CM Consult   Living arrangements for the past 2 months: Single Family Home                                       Social Drivers of Health (SDOH) Interventions SDOH Screenings   Food Insecurity: No Food Insecurity (01/04/2025)  Housing: Low Risk (01/04/2025)  Transportation Needs: No Transportation Needs (01/04/2025)  Utilities: Not At Risk (01/04/2025)  Alcohol  Screen: Low Risk (04/29/2023)  Depression (PHQ2-9): Medium Risk (01/02/2025)  Financial Resource Strain: Medium Risk (06/09/2024)  Physical Activity: Inactive (06/09/2024)  Social Connections: Moderately Integrated (01/04/2025)  Stress: Stress Concern Present (06/09/2024)  Tobacco Use: Low Risk (01/05/2025)    Readmission Risk Interventions     No data to display

## 2025-01-09 NOTE — Transfer of Care (Signed)
 Immediate Anesthesia Transfer of Care Note  Patient: April Warren  Procedure(s) Performed: POSTERIOR CERVICAL FUSION/FORAMINOTOMY LEVEL 5  Patient Location: PACU  Anesthesia Type:General  Level of Consciousness: awake, alert, oriented. Moving all extremities per baseline. R> L.  Airway & Oxygen Therapy: Patient Spontanous Breathing and Patient connected to face mask oxygen  Post-op Assessment: Report given to RN and Post -op Vital signs reviewed and stable  Post vital signs: Reviewed and stable  Last Vitals:  Vitals Value Taken Time  BP 134/62 01/09/25 16:45  Temp 36.9 C 01/09/25 16:32  Pulse 76 01/09/25 16:51  Resp 18 01/09/25 16:51  SpO2 96 % 01/09/25 16:51  Vitals shown include unfiled device data.  Last Pain:  Vitals:   01/09/25 1113  TempSrc:   PainSc: 7       Patients Stated Pain Goal: 2 (01/07/25 1955)  Complications: No notable events documented.

## 2025-01-09 NOTE — Progress Notes (Signed)
 Pt has tingling in RUE from neck to elbow that started after surgery today. Extremity is warm and dry to touch, pulses intact, pt can feel light touch and move extremity without issues. Opyd, MD notified.

## 2025-01-09 NOTE — Anesthesia Preprocedure Evaluation (Addendum)
"                                    Anesthesia Evaluation  Patient identified by MRN, date of birth, ID band Patient awake    Reviewed: Allergy & Precautions, H&P , NPO status , Patient's Chart, lab work & pertinent test results  Airway Mallampati: II  TM Distance: >3 FB Neck ROM: Full    Dental no notable dental hx.    Pulmonary neg pulmonary ROS, asthma    Pulmonary exam normal breath sounds clear to auscultation       Cardiovascular Exercise Tolerance: Good hypertension, Pt. on medications Normal cardiovascular exam Rhythm:Regular Rate:Normal     Neuro/Psych   Anxiety      Neuromuscular disease  negative psych ROS   GI/Hepatic negative GI ROS, Neg liver ROS,,,  Endo/Other  diabetes, Type 2  Class 4 obesity  Renal/GU negative Renal ROS  negative genitourinary   Musculoskeletal negative musculoskeletal ROS (+)    Abdominal   Peds negative pediatric ROS (+)  Hematology negative hematology ROS (+)   Anesthesia Other Findings   Reproductive/Obstetrics negative OB ROS                              Anesthesia Physical Anesthesia Plan  ASA: 3  Anesthesia Plan: General   Post-op Pain Management: Tylenol  PO (pre-op)*, Celebrex  PO (pre-op)*, Dilaudid  IV and Precedex    Induction:   PONV Risk Score and Plan: 2 and Ondansetron  and Dexamethasone   Airway Management Planned: Oral ETT and Video Laryngoscope Planned  Additional Equipment: None  Intra-op Plan:   Post-operative Plan: Extubation in OR  Informed Consent: I have reviewed the patients History and Physical, chart, labs and discussed the procedure including the risks, benefits and alternatives for the proposed anesthesia with the patient or authorized representative who has indicated his/her understanding and acceptance.       Plan Discussed with: Anesthesiologist and CRNA  Anesthesia Plan Comments: (54 yo F adm to ED 01/04/25 s/p falls and increasing weakness x  2 days.  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  adm to ED 01/04/25 s/p falls and increasing weakness x 2 days.  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 Neurosurgery consult with plans for C2-7 posterior laminectomy 01/09/25  (due to current respiratory infection and incr creatinine). PMH significant for diabetic retinopathy, HTN)         Anesthesia Quick Evaluation  "

## 2025-01-10 ENCOUNTER — Other Ambulatory Visit (HOSPITAL_COMMUNITY): Payer: Self-pay

## 2025-01-10 LAB — GLUCOSE, CAPILLARY
Glucose-Capillary: 251 mg/dL — ABNORMAL HIGH (ref 70–99)
Glucose-Capillary: 225 mg/dL — ABNORMAL HIGH (ref 70–99)
Glucose-Capillary: 345 mg/dL — ABNORMAL HIGH (ref 70–99)
Glucose-Capillary: 372 mg/dL — ABNORMAL HIGH (ref 70–99)
Glucose-Capillary: 316 mg/dL — ABNORMAL HIGH (ref 70–99)

## 2025-01-10 LAB — PHOSPHORUS: Phosphorus: 4.2 mg/dL (ref 2.5–4.6)

## 2025-01-10 LAB — CBC WITH DIFFERENTIAL/PLATELET
Abs Immature Granulocytes: 0.05 K/uL (ref 0.00–0.07)
Basophils Absolute: 0 K/uL (ref 0.0–0.1)
Basophils Relative: 0 %
Eosinophils Absolute: 0 K/uL (ref 0.0–0.5)
Eosinophils Relative: 0 %
HCT: 34 % — ABNORMAL LOW (ref 36.0–46.0)
Hemoglobin: 11.4 g/dL — ABNORMAL LOW (ref 12.0–15.0)
Immature Granulocytes: 0 %
Lymphocytes Relative: 12 %
Lymphs Abs: 1.5 K/uL (ref 0.7–4.0)
MCH: 31.1 pg (ref 26.0–34.0)
MCHC: 33.5 g/dL (ref 30.0–36.0)
MCV: 92.9 fL (ref 80.0–100.0)
Monocytes Absolute: 0.6 K/uL (ref 0.1–1.0)
Monocytes Relative: 5 %
Neutro Abs: 9.9 K/uL — ABNORMAL HIGH (ref 1.7–7.7)
Neutrophils Relative %: 83 %
Platelets: 319 K/uL (ref 150–400)
RBC: 3.66 MIL/uL — ABNORMAL LOW (ref 3.87–5.11)
RDW: 12.1 % (ref 11.5–15.5)
WBC: 12 K/uL — ABNORMAL HIGH (ref 4.0–10.5)
nRBC: 0 % (ref 0.0–0.2)

## 2025-01-10 LAB — COMPREHENSIVE METABOLIC PANEL WITH GFR
ALT: 18 U/L (ref 0–44)
AST: 21 U/L (ref 15–41)
Albumin: 3.5 g/dL (ref 3.5–5.0)
Alkaline Phosphatase: 66 U/L (ref 38–126)
Anion gap: 11 (ref 5–15)
BUN: 49 mg/dL — ABNORMAL HIGH (ref 6–20)
CO2: 22 mmol/L (ref 22–32)
Calcium: 8.7 mg/dL — ABNORMAL LOW (ref 8.9–10.3)
Chloride: 101 mmol/L (ref 98–111)
Creatinine, Ser: 1.18 mg/dL — ABNORMAL HIGH (ref 0.44–1.00)
GFR, Estimated: 55 mL/min — ABNORMAL LOW
Glucose, Bld: 279 mg/dL — ABNORMAL HIGH (ref 70–99)
Potassium: 4.8 mmol/L (ref 3.5–5.1)
Sodium: 134 mmol/L — ABNORMAL LOW (ref 135–145)
Total Bilirubin: 0.4 mg/dL (ref 0.0–1.2)
Total Protein: 6 g/dL — ABNORMAL LOW (ref 6.5–8.1)

## 2025-01-10 LAB — MAGNESIUM: Magnesium: 1.7 mg/dL (ref 1.7–2.4)

## 2025-01-10 MED ORDER — BACLOFEN 10 MG PO TABS
5.0000 mg | ORAL_TABLET | Freq: Two times a day (BID) | ORAL | Status: DC
  Administered 2025-01-10 – 2025-01-12 (×5): 5 mg via ORAL
  Filled 2025-01-10 (×6): qty 1

## 2025-01-10 MED ORDER — INSULIN GLARGINE 100 UNIT/ML ~~LOC~~ SOLN
18.0000 [IU] | SUBCUTANEOUS | Status: DC
  Administered 2025-01-10: 18 [IU] via SUBCUTANEOUS
  Filled 2025-01-10 (×3): qty 0.18

## 2025-01-10 MED ORDER — INSULIN ASPART 100 UNIT/ML IJ SOLN
4.0000 [IU] | Freq: Three times a day (TID) | INTRAMUSCULAR | Status: DC
  Administered 2025-01-10 – 2025-01-11 (×2): 4 [IU] via SUBCUTANEOUS
  Filled 2025-01-10 (×2): qty 4

## 2025-01-10 MED ORDER — INSULIN ASPART 100 UNIT/ML IJ SOLN
0.0000 [IU] | Freq: Three times a day (TID) | INTRAMUSCULAR | Status: DC
  Administered 2025-01-10: 15 [IU] via SUBCUTANEOUS
  Administered 2025-01-11 (×2): 8 [IU] via SUBCUTANEOUS
  Administered 2025-01-11: 5 [IU] via SUBCUTANEOUS
  Administered 2025-01-12: 3 [IU] via SUBCUTANEOUS
  Administered 2025-01-12: 8 [IU] via SUBCUTANEOUS
  Filled 2025-01-10: qty 3
  Filled 2025-01-10 (×2): qty 8
  Filled 2025-01-10: qty 10
  Filled 2025-01-10: qty 5

## 2025-01-10 MED ORDER — INSULIN ASPART 100 UNIT/ML IJ SOLN
0.0000 [IU] | Freq: Every day | INTRAMUSCULAR | Status: DC
  Administered 2025-01-11: 2 [IU] via SUBCUTANEOUS
  Filled 2025-01-10: qty 4
  Filled 2025-01-10: qty 2

## 2025-01-10 NOTE — Anesthesia Postprocedure Evaluation (Signed)
"   Anesthesia Post Note  Patient: April Warren  Procedure(s) Performed: POSTERIOR CERVICAL FUSION/FORAMINOTOMY LEVEL 5     Patient location during evaluation: PACU Anesthesia Type: General Level of consciousness: awake and alert Pain management: pain level controlled Vital Signs Assessment: post-procedure vital signs reviewed and stable Respiratory status: spontaneous breathing, nonlabored ventilation, respiratory function stable and patient connected to nasal cannula oxygen Cardiovascular status: blood pressure returned to baseline and stable Postop Assessment: no apparent nausea or vomiting Anesthetic complications: no   No notable events documented.  Last Vitals:  Vitals:   01/10/25 0510 01/10/25 0700  BP:    Pulse:    Resp: 17 16  Temp:    SpO2:      Last Pain:  Vitals:   01/10/25 0420  TempSrc: Oral  PainSc:                  Amato Sevillano L Saretta Dahlem      "

## 2025-01-10 NOTE — Progress Notes (Signed)
 Patient ID: April Warren, female   DOB: [birth year], 54 y.o.   MRN: [id number] Subjective: Patient reports right shoulder pain with some arm aching and numbness and tingling without weakness.  Feels like her left side is moving better.  Objective: Vital signs in last 24 hours: Temp:  [97.6 F (36.4 C)-98.5 F (36.9 C)] 97.8 F (36.6 C) (01/13 0420) Pulse Rate:  [48-80] 48 (01/13 0420) Resp:  [13-20] 16 (01/13 0700) BP: (133-158)/(50-68) 135/50 (01/13 0420) SpO2:  [91 %-99 %] 98 % (01/13 0420) Weight:  [875 kg] 124 kg (01/12 1057)  Intake/Output from previous day: 01/12 0701 - 01/13 0700 In: 3035.8 [P.O.:480; I.V.:2455.8; IV Piggyback:100.1] Out: 1995 [Urine:1725; Drains:70; Blood:200] Intake/Output this shift: No intake/output data recorded.  Awake and alert.  Right side has some mild atrophy and spasticity as before surgery but good strength throughout.  Left side shows better finger extension and grip and dorsiflexion than preop.  Right upper extremity is 5 out of 5 throughout.  Left upper extremity is 5 out of 5 except for the hand which now has almost full finger extension and a pretty good grip.  Lab Results: Lab Results  Component Value Date   WBC 12.0 (H) 01/10/2025   HGB 11.4 (L) 01/10/2025   HCT 34.0 (L) 01/10/2025   MCV 92.9 01/10/2025   PLT 319 01/10/2025   Lab Results  Component Value Date   INR 1.03 03/04/2011   BMET Lab Results  Component Value Date   NA 134 (L) 01/10/2025   K 4.8 01/10/2025   CL 101 01/10/2025   CO2 22 01/10/2025   GLUCOSE 279 (H) 01/10/2025   BUN 49 (H) 01/10/2025   CREATININE 1.18 (H) 01/10/2025   CALCIUM  8.7 (L) 01/10/2025    Studies/Results: DG Cervical Spine 2 or 3 views Result Date: 01/10/2025 EXAM: FLUOROSCOPIC IMAGING TECHNIQUE: Fluoroscopy was provided by the radiology department for procedure. Radiologist was not present during examination. RADIATION DOSE INDEX: Fluoro time: 23.5 seconds. Reference Air Kerma: 5.22 mGy.  COMPARISON: MRI cervical spine dated 04/15/2023. CT cervical spine dated 01/02/2025. CLINICAL HISTORY: [id number] Surgery, elective [id number]. FINDINGS: Intraoperative fluoroscopic imaging was performed. Intraoperative placement of posterior cervical fusion and foraminotomy at the C2 to C7 levels. A total of 4 intraoperative low-resolution images of the cervical spine were obtained. IMPRESSION: 1. Intraoperative fluoroscopic imaging as above. Please refer to the operative report for full details. Electronically signed by: Morgane Naveau MD MD 01/10/2025 12:46 AM EST RP Workstation: [id number]   DG C-Arm 1-60 Min-No Report Result Date: 01/09/2025 Fluoroscopy was utilized by the requesting physician.  No radiographic interpretation.   DG C-Arm 1-60 Min-No Report Result Date: 01/09/2025 Fluoroscopy was utilized by the requesting physician.  No radiographic interpretation.   DG C-Arm 1-60 Min-No Report Result Date: 01/09/2025 Fluoroscopy was utilized by the requesting physician.  No radiographic interpretation.    Assessment/Plan: Overall doing well.  Improvement in left side neurologic function.  Mobilize with therapy.  Will need CIR.  Estimated body mass index is 40.37 kg/m as calculated from the following:   Height as of this encounter: 5' 9 (1.753 m).   Weight as of this encounter: 124 kg.    LOS: 5 days    Alm GORMAN Molt 01/10/2025, 7:58 AM

## 2025-01-10 NOTE — Consult Note (Signed)
 "     Physical Medicine and Rehabilitation Consult Reason for Consult:CIR/SCI Referring Physician: Dr Perri and [facility]   HPI: April Warren is a 54 y.o.  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   Due to AKI on CKD with Cr of 1.6 down to 1.18 and acute bronchitis, pt's surgery was delayed for 1 week.   Pt underwent surgery by Dr Joshua yesterday- PLA C2-C7- with screws placed- since surgery, having severe R shoulder pain- per pt, Dr Joshua told her he taped the shoulder down and that's why hurting so much. Also obviously Neck pain- ~ 8/10.  Using Oxy q3-4 hours- working well when takes it. Was using tylenol  prior to surgery.   On Decadron  4mg  q6 hours- BG's are 200's 300's and not on home DM meds as detailed in plan.   Foley placed due to urinary retention in ED and LBM 1/10 - multiple large BM's but had a few accidents due to not being hold it a long time. Reports that usually has bowel urgency after eats.   Also reports whole body spasms occ- mainly when they lay her back in bed- not specific to any time of day.   N/T in fingers and toes but has neuropathy of feet, not hands.     Review of Systems  Constitutional:  Positive for malaise/fatigue.  HENT: Negative.    Eyes: Negative.   Respiratory:  Negative for cough and shortness of breath.        Done with bronchitis  Cardiovascular:  Negative for chest pain and leg swelling.  Gastrointestinal:  Positive for constipation. Negative for nausea and vomiting.  Genitourinary:  Negative for flank pain.       Has foley- due to urinary retention  Musculoskeletal:  Positive for falls, myalgias and neck pain.  Skin: Negative.   Neurological:  Positive for tingling, sensory change, focal weakness and weakness.  Endo/Heme/Allergies:  Negative.   Psychiatric/Behavioral:  The patient is nervous/anxious and has insomnia.   All other systems reviewed and are negative.  Past Medical History:  Diagnosis Date   Allergy    Asthma    Certain time of the year   Chicken pox 54 yrs old   Diabetes mellitus 08/12/2012   type 2- dr alvia diagnosed   Diabetic retinopathy (HCC)    Dysmenorrhea 08/20/2012   HTN (hypertension) 08/20/2012   Hyperlipidemia, mixed 08/28/2013   Neuromuscular disorder (HCC)    Not sure of date   Neuropathy 02/16/2017   Obesity    Obesity, unspecified 08/28/2013   Other and unspecified hyperlipidemia 08/28/2013   Preventative health care 08/20/2012   Sinusitis 11/14/2013   Sinusitis, acute 05/02/2016   Past Surgical History:  Procedure Laterality Date   BREAST BIOPSY Right 01/29/2024   US  RT BREAST BX W LOC DEV 1ST LESION IMG BX SPEC US  GUIDE 01/29/2024 [facility]GR[facility]   EYE SURGERY     Laser surgery for bleeds   REFRACTIVE SURGERY  08/12/2012   right, left on 09/16/12   Family History  Problem Relation Age of Onset   Cancer Mother 63       breast   Diabetes Mother        type 2   Hypertension Mother    Hypertension Father    Hyperlipidemia Father  COPD Father    Arthritis Father    Asthma Father    Hypertension Maternal Grandmother    Hyperlipidemia Maternal Grandmother    COPD Maternal Grandmother    Asthma Maternal Grandmother    Hypertension Maternal Grandfather    Heart attack Maternal Grandfather    Stroke Maternal Grandfather    Gout Brother    Cancer Brother        lung   Heart disease Paternal Grandfather    Alcohol  abuse Paternal Grandfather    Social History:  reports that she has never smoked. She has never used smokeless tobacco. She reports current alcohol  use. She reports that she does not use drugs. Allergies: Allergies[1] Medications Prior to Admission  Medication Sig Dispense Refill   albuterol  (PROAIR  HFA) 108 (90 Base) MCG/ACT inhaler USE 2  INHALATIONS EVERY 6 HOURS AS NEEDED FOR WHEEZING OR SHORTNESS OF BREATH (Patient taking differently: 2 puffs every 6 (six) hours as needed for wheezing or shortness of breath.) 18 g 5   aspirin EC 81 MG tablet Take 81 mg by mouth daily. Swallow whole.     atorvastatin  (LIPITOR ) 80 MG tablet TAKE 1 TABLET DAILY 90 tablet 3   cetirizine  (ZYRTEC ) 10 MG tablet Take 1 tablet (10 mg total) by mouth daily as needed for allergies or rhinitis. 90 tablet 1   doxycycline  (VIBRAMYCIN ) 100 MG capsule Take 1 capsule (100 mg total) by mouth 2 (two) times daily. 20 capsule 0   ezetimibe  (ZETIA ) 10 MG tablet Take 1 tablet (10 mg total) by mouth daily. 90 tablet 3   fenofibrate  micronized (LOFIBRA) 134 MG capsule Take 1 capsule (134 mg total) by mouth daily before breakfast. 90 capsule 1   gabapentin  (NEURONTIN ) 300 MG capsule TAKE 1 CAPSULE TWICE A DAY AND 2 CAPSULES AT BEDTIME 360 capsule 3   glipiZIDE  (GLUCOTROL ) 10 MG tablet Take 0.5 tablets (5 mg total) by mouth 2 (two) times daily before a meal. (Patient taking differently: Take 10 mg by mouth daily before breakfast.) 180 tablet 1   ibuprofen (ADVIL,MOTRIN) 200 MG tablet Take 400 mg by mouth every 8 (eight) hours as needed for mild pain (pain score 1-3).     JANUVIA  100 MG tablet TAKE 1 TABLET DAILY 90 tablet 3   lisinopril  (ZESTRIL ) 20 MG tablet Take 1 tablet (20 mg total) by mouth 2 (two) times daily. 180 tablet 1   LORazepam  (ATIVAN ) 1 MG tablet TAKE 1/2 TABLET BY MOUTH TWICE DAILY AS NEEDED FOR ANXIETY (Patient taking differently: Take 0.5 mg by mouth at bedtime.) 90 tablet 1   Magnesium Oxide 400 MG CAPS Take 1 capsule by mouth daily. Pt is taking 825mg  capsule every other day. (Patient taking differently: Take 1 capsule by mouth daily.)     metFORMIN  (GLUCOPHAGE -XR) 750 MG 24 hr tablet Take 1 tablet (750 mg total) by mouth daily with breakfast.     metoprolol  tartrate (LOPRESSOR ) 50 MG tablet TAKE 1 TABLET TWICE A DAY 180 tablet 3   montelukast  (SINGULAIR )  10 MG tablet TAKE 1 TABLET AT BEDTIME 90 tablet 3   Potassium Bicarbonate 99 MG CAPS Take 2 capsules by mouth daily.     triamterene -hydrochlorothiazide  (MAXZIDE-25) 37.5-25 MG tablet TAKE 1 TABLET DAILY 90 tablet 3   Vitamin D , Ergocalciferol , (DRISDOL ) 1.25 MG (50000 UNIT) CAPS capsule TAKE 1 CAPSULE EVERY 7 DAYS 12 capsule 3   Blood Glucose Monitoring Suppl (FREESTYLE FREEDOM LITE) w/Device KIT Check blood sugar twice daily 1 each 0   glucose  blood test strip Use as instructed 100 each 12   Lancets (FREESTYLE) lancets Check blood sugar twice daily 200 each 5   meloxicam  (MOBIC ) 7.5 MG tablet Take 1-2 tablets (7.5-15 mg total) by mouth daily. (Patient not taking: Reported on 01/04/2025) 40 tablet 1    Home: Home Living Family/patient expects to be discharged to:: Private residence Living Arrangements: Spouse/significant other, Children (5 children all under the age of 26 yo) Available Help at Discharge: Family, Available 24 hours/day Type of Home: House Home Access: Ramped entrance Home Layout: One level Bathroom Shower/Tub: Tub/shower unit, Engineer, Building Services: Handicapped height Bathroom Accessibility: Yes Home Equipment: Cane - single point, Wheelchair - manual Additional Comments: they have 5 children in the home 3yo- 51 yo adopted.  Lives With: Spouse, Family  Functional History: Prior Function Prior Level of Function : Independent/Modified Independent, History of Falls (last six months) Mobility Comments: prior to christmas; 2 falls (one slip in shower, one step off curb); then getting out of car fell backwards Functional Status:  Mobility: Bed Mobility Overal bed mobility: Needs Assistance Bed Mobility: Rolling, Sidelying to Sit, Sit to Sidelying Rolling: Mod assist Sidelying to sit: +2 for physical assistance, HOB elevated, Max assist Sit to sidelying: Mod assist, +2 for physical assistance General bed mobility comments: rolling for pad placement; from left side,  assisted to lower legs over EOB, HOB elevated to initiate side to sit and come to sit +2 max; guiding upper body with sit to side and assist elevating bil LEs Transfers Overall transfer level: Needs assistance Equipment used: 2 person hand held assist Transfers: Sit to/from Stand Sit to Stand: Min assist, Mod assist, From elevated surface General transfer comment: bed elevated ~4; initial stand with +2 mod assist (pt hooking elbows with each therapist and use of bed pad); second attempt +2 min assist with better upright posture obtained Ambulation/Gait Pre-gait activities: lateral wt-shifting x 5 each direction with bil UE support    ADL: ADL Overall ADL's : Needs assistance/impaired Eating/Feeding: Minimal assistance, Bed level Grooming: Minimal assistance, Bed level Upper Body Bathing: Moderate assistance, Bed level Lower Body Bathing: Total assistance Upper Body Dressing : Moderate assistance Lower Body Dressing: Maximal assistance General ADL Comments: pt able to complete self feeding of a muffin this sesison with red foam fork  Cognition: Cognition Orientation Level: Oriented X4 Cognition Arousal: Alert Behavior During Therapy: WFL for tasks assessed/performed  Blood pressure (!) 131/54, pulse 71, temperature 98.4 F (36.9 C), temperature source Oral, resp. rate 15, height 5' 9 (1.753 m), weight 124 kg, last menstrual period 10/28/2014, SpO2 95%. Physical Exam Vitals and nursing note reviewed.  Constitutional:      Appearance: Normal appearance. She is obese.     Comments: Awake, alert, appropriate, sitting up in bed; NAD  HENT:     Head: Normocephalic and atraumatic.     Comments: No facial asymmetry    Right Ear: External ear normal.     Left Ear: External ear normal.     Nose: Nose normal. No congestion.     Mouth/Throat:     Mouth: Mucous membranes are dry.     Pharynx: Oropharynx is clear. No oropharyngeal exudate.  Eyes:     General:        Right eye: No  discharge.        Left eye: No discharge.     Extraocular Movements: Extraocular movements intact.  Neck:     Comments: Posterior incision with original surgical dressing in place- moderate  assosciated swelling- betadine still on skin surrounding Cardiovascular:     Rate and Rhythm: Normal rate and regular rhythm.     Heart sounds: Normal heart sounds. No murmur heard.    No gallop.  Pulmonary:     Effort: Pulmonary effort is normal. No respiratory distress.     Breath sounds: Normal breath sounds. No wheezing, rhonchi or rales.  Abdominal:     Palpations: Abdomen is soft.     Comments: Hypoactive-  NT, ND  Genitourinary:    Comments: Foley in place (+)- medium amber urine Musculoskeletal:     Comments: RUE- deltoid 4-/5; Biceps 4-/5; WE 4/5; Triceps 4-/5; WE 5-/5; Grip 5-/5; FA 2/5 LUE- Deltoid 4-/5; biceps 4/5; WE 4+/5; triceps 4/5; Grip 3-/5, FA 2-/5 RLE- HF 4+/5; KE 4/5; DF/PF 4+/5; EHL 4+/5 LLE- HF 2/5; KE 4-/5; DF 2-/5 PF 2/5; EHL 2-/5  Skin:    General: Skin is warm and dry.     Comments: Surgical incision on neck- covered with  surgical dressing L hand IV-  Neurological:     Mental Status: She is alert and oriented to person, place, and time.     Comments: Intact mons and buttock sensation B/L- didn't do rectal exam due to pt's ASIA D Decreased to light touch from C6 (starting at C6) downwards on R and C5 (starting at C5) downwards on L Intact sensation to light touch in torso and L1-L3 B/L Decreased to light touch L4-S1- but has neuropathy MAS of 1 in Arms/Ue's (increased tone in arms- mild) , but MAS of 0 in Les-(no spasticity in legs) Hoffmans (+) B/L brisk in fingers No clonus in wrist or ankles B/L DTR's 2+ in Ue's and 3+ in LE's which is unusual due to her DM and neuropathy Ox3    Psychiatric:     Comments: Slightly depressed and anxious, but very appropriate     Results for orders placed or performed during the hospital encounter of 01/04/25 (from the past 24  hours)  Glucose, capillary     Status: Abnormal   Collection Time: 01/09/25  2:31 PM  Result Value Ref Range   Glucose-Capillary 204 (H) 70 - 99 mg/dL  Glucose, capillary     Status: Abnormal   Collection Time: 01/09/25  4:36 PM  Result Value Ref Range   Glucose-Capillary 219 (H) 70 - 99 mg/dL  Glucose, capillary     Status: Abnormal   Collection Time: 01/09/25  6:03 PM  Result Value Ref Range   Glucose-Capillary 279 (H) 70 - 99 mg/dL  Glucose, capillary     Status: Abnormal   Collection Time: 01/09/25  8:07 PM  Result Value Ref Range   Glucose-Capillary 248 (H) 70 - 99 mg/dL   Comment 1 Notify RN    Comment 2 Document in Chart   Glucose, capillary     Status: Abnormal   Collection Time: 01/09/25 11:27 PM  Result Value Ref Range   Glucose-Capillary 274 (H) 70 - 99 mg/dL   Comment 1 Notify RN    Comment 2 Document in Chart   CBC with Differential/Platelet     Status: Abnormal   Collection Time: 01/10/25  2:09 AM  Result Value Ref Range   WBC 12.0 (H) 4.0 - 10.5 K/uL   RBC 3.66 (L) 3.87 - 5.11 MIL/uL   Hemoglobin 11.4 (L) 12.0 - 15.0 g/dL   HCT 65.9 (L) 63.9 - 53.9 %   MCV 92.9 80.0 - 100.0 fL   MCH 31.1 26.0 -  34.0 pg   MCHC 33.5 30.0 - 36.0 g/dL   RDW 87.8 88.4 - 84.4 %   Platelets 319 150 - 400 K/uL   nRBC 0.0 0.0 - 0.2 %   Neutrophils Relative % 83 %   Neutro Abs 9.9 (H) 1.7 - 7.7 K/uL   Lymphocytes Relative 12 %   Lymphs Abs 1.5 0.7 - 4.0 K/uL   Monocytes Relative 5 %   Monocytes Absolute 0.6 0.1 - 1.0 K/uL   Eosinophils Relative 0 %   Eosinophils Absolute 0.0 0.0 - 0.5 K/uL   Basophils Relative 0 %   Basophils Absolute 0.0 0.0 - 0.1 K/uL   Immature Granulocytes 0 %   Abs Immature Granulocytes 0.05 0.00 - 0.07 K/uL  Comprehensive metabolic panel with GFR     Status: Abnormal   Collection Time: 01/10/25  2:09 AM  Result Value Ref Range   Sodium 134 (L) 135 - 145 mmol/L   Potassium 4.8 3.5 - 5.1 mmol/L   Chloride 101 98 - 111 mmol/L   CO2 22 22 - 32 mmol/L    Glucose, Bld 279 (H) 70 - 99 mg/dL   BUN 49 (H) 6 - 20 mg/dL   Creatinine, Ser 8.81 (H) 0.44 - 1.00 mg/dL   Calcium  8.7 (L) 8.9 - 10.3 mg/dL   Total Protein 6.0 (L) 6.5 - 8.1 g/dL   Albumin 3.5 3.5 - 5.0 g/dL   AST 21 15 - 41 U/L   ALT 18 0 - 44 U/L   Alkaline Phosphatase 66 38 - 126 U/L   Total Bilirubin 0.4 0.0 - 1.2 mg/dL   GFR, Estimated 55 (L) >60 mL/min   Anion gap 11 5 - 15  Magnesium     Status: None   Collection Time: 01/10/25  2:09 AM  Result Value Ref Range   Magnesium 1.7 1.7 - 2.4 mg/dL  Phosphorus     Status: None   Collection Time: 01/10/25  2:09 AM  Result Value Ref Range   Phosphorus 4.2 2.5 - 4.6 mg/dL  Glucose, capillary     Status: Abnormal   Collection Time: 01/10/25  4:35 AM  Result Value Ref Range   Glucose-Capillary 251 (H) 70 - 99 mg/dL  Glucose, capillary     Status: Abnormal   Collection Time: 01/10/25  8:40 AM  Result Value Ref Range   Glucose-Capillary 225 (H) 70 - 99 mg/dL  Glucose, capillary     Status: Abnormal   Collection Time: 01/10/25 11:49 AM  Result Value Ref Range   Glucose-Capillary 345 (H) 70 - 99 mg/dL   DG Cervical Spine 2 or 3 views Result Date: 01/10/2025 EXAM: FLUOROSCOPIC IMAGING TECHNIQUE: Fluoroscopy was provided by the radiology department for procedure. Radiologist was not present during examination. RADIATION DOSE INDEX: Fluoro time: 23.5 seconds. Reference Air Kerma: 5.22 mGy. COMPARISON: MRI cervical spine dated 04/15/2023. CT cervical spine dated 01/02/2025. CLINICAL HISTORY: 886218 Surgery, elective [id number]. FINDINGS: Intraoperative fluoroscopic imaging was performed. Intraoperative placement of posterior cervical fusion and foraminotomy at the C2 to C7 levels. A total of 4 intraoperative low-resolution images of the cervical spine were obtained. IMPRESSION: 1. Intraoperative fluoroscopic imaging as above. Please refer to the operative report for full details. Electronically signed by: Morgane Naveau MD MD 01/10/2025 12:46 AM  EST RP Workstation: [id number]   DG C-Arm 1-60 Min-No Report Result Date: 01/09/2025 Fluoroscopy was utilized by the requesting physician.  No radiographic interpretation.   DG C-Arm 1-60 Min-No Report Result Date: 01/09/2025 Fluoroscopy was utilized  by the requesting physician.  No radiographic interpretation.   DG C-Arm 1-60 Min-No Report Result Date: 01/09/2025 Fluoroscopy was utilized by the requesting physician.  No radiographic interpretation.     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 Yes Co-Morbidities requiring supervision/potential complications: Neurogenic bowel and bladder, spasticity, post op pain;  DM- uncontrolled, Diabetic neuropathy and retinopathy; A1c 7.1; Class 3 obesity; HTN.  Due to bladder management, bowel management, safety, skin/wound care, disease management, medication administration, pain management, and patient education, does the patient require 24 hr/day rehab nursing? Yes Does the patient require coordinated care of a physician, rehab nurse, therapy disciplines of PT and OT to address physical and functional deficits in the context of the above medical diagnosis(es)? Yes Addressing deficits in the following areas: balance, endurance, locomotion, strength, transferring, bowel/bladder control, bathing, dressing, feeding, grooming, toileting, and cognition Can the patient actively participate in an intensive therapy program of at least 3 hrs of therapy per day at least 5 days per week? Yes The potential for patient to make measurable gains while on inpatient rehab is good Anticipated functional outcomes upon discharge from inpatient rehab are min assist  with PT, min assist with OT, n/a with SLP. Estimated rehab length of stay to reach the above  functional goals is: 3.5 to 4 weeks Anticipated discharge destination: Home Overall Rehab/Functional Prognosis: good  RECOMMENDATIONS: This patient's condition is appropriate for continued rehabilitative care in the following setting: CIR Patient has agreed to participate in recommended program. Yes Note that insurance prior authorization may be required for reimbursement for recommended care.  Comment:  Will order Baclofen  5 mg BID- since has had renal issues, and Current Cr 1.18, it's OK to do, but won't increase til monitor Renal issues in more depth - pt having tone/tightness and spasticity/spasms from SCI.   2. Suggest restarting home doses of DM meds- on Januvia , Glipzide 5 mg BID, and Metformin  750 mg qday at home.  3. Once comes to inpt rehab, will remove Foley and teach in/out caths- once BP is more stable strongly suggest starting Flomax 0.4 mg Qsupper to try and help her void once foley removed.   4. Has neurogenic bowel- wait for bowel program quite yet, con't Senna BID and if no BM by tomorrow, give Miralax .   5. Spoke with admissions coordinator- will file for CIR.  6. Thank you for this consult  7. Did discuss general prognosis- will likely be able to walk, however Since it's the first day, it's hard to know if will need a RW long term- is not a central cord at this time, based on strength exam as of now.     Netha Dafoe, MD 01/10/2025    I spent a total of  86   minutes on total care today- >50% coordination of care- due to  D/w pt - interview, ASIA exam, which includes strength exam and sensory exam; as well as review of chart, therapy notes, and orders- made order changes and documentation.     [1]  Allergies Allergen Reactions   Shellfish Allergy Other (See Comments)    Tongue and Throat tingling and itching. No swelling or SOB   "

## 2025-01-10 NOTE — Inpatient Diabetes Management (Signed)
 Inpatient Diabetes Program Recommendations  AACE/ADA: New Consensus Statement on Inpatient Glycemic Control (2025)  Target Ranges:  Prepandial:   less than 140 mg/dL      Peak postprandial:   less than 180 mg/dL (1-2 hours)      Critically ill patients:  140 - 180 mg/dL   Lab Results  Component Value Date   GLUCAP 345 (H) 01/10/2025   HGBA1C 7.1 (H) 01/05/2025    Review of Glycemic Control  Latest Reference Range & Units 01/09/25 23:27 01/10/25 04:35 01/10/25 08:40 01/10/25 11:49  Glucose-Capillary 70 - 99 mg/dL 725 (H) 748 (H) 774 (H) 345 (H)   Diabetes history: DM 2 Outpatient Diabetes medications:  Glucotrol  10 mg q AM Januvia  100 mg daily Metformin  750 mg daily Current orders for Inpatient glycemic control:  Novolog  0-9 units q 4 hours Decadron  4 mg q 6 hours  Lantus  10 units daily  Inpatient Diabetes Program Recommendations:    While on steroids, consider increasing Lantus  to 18 units daily and add Novolog  meal coverage 4 units tid with meals (hold if patient eats less than 50% or NPO).   Thanks,  Randall Bullocks, RN, BC-ADM Inpatient Diabetes Coordinator Pager [phone]  (8a-5p)

## 2025-01-10 NOTE — Evaluation (Signed)
 Physical Therapy Re-Evaluation Patient Details Name: April Warren MRN: [id number] DOB: 10-13-1971 Today's Date: 01/10/2025  History of Present Illness  54 year old female presented to ED 01/04/25 s/p falls and increasing weakness x 2 days.  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 Neurosurgery consult with plans for C2-7 posterior laminectomy next week after medical tune-up (due to current respiratory infection and incr creatinine). 1/12 PLA C2-3 C3-4 C4-5 C5-6 C6-7 segmental lateral mass fixation C2-C7 utilizing ATEC lateral mass screws PMH significant for diabetic retinopathy, hypertension.  Clinical Impression   Pt admitted secondary to problem above with deficits below. PTA patient pt had 2 falls prior to Christmas, plus a third fall resulting in this hospitalization. She was ambulatory without a device prior to most recent fall.  Pt currently is now s/p Cervical posterior-lateral arthrodesis with improved strength in left extremities. Requires +2 mod-max assist for bed mobility, up to mod assist for sitting balance, and able to stand from elevated bed with +2 mod-min assist. Patient is highly motivated and will benefit from continued inpatient follow up therapy, >3 hours/day. Anticipate patient will benefit from PT to address problems listed below. Will continue to follow acutely to maximize functional mobility, independence, and safety.           If plan is discharge home, recommend the following: Two people to help with walking and/or transfers;Assistance with cooking/housework;Assistance with feeding;Assist for transportation;Help with stairs or ramp for entrance   Can travel by private vehicle        Equipment Recommendations BSC/3in1;Hospital bed  Recommendations for Other Services       Functional Status Assessment Patient has had a recent decline in their functional status and  demonstrates the ability to make significant improvements in function in a reasonable and predictable amount of time.     Precautions / Restrictions Precautions Precautions: Fall Recall of Precautions/Restrictions: Intact Restrictions Weight Bearing Restrictions Per Provider Order: No      Mobility  Bed Mobility Overal bed mobility: Needs Assistance Bed Mobility: Rolling, Sidelying to Sit, Sit to Sidelying Rolling: Mod assist Sidelying to sit: +2 for physical assistance, HOB elevated, Max assist     Sit to sidelying: Mod assist, +2 for physical assistance General bed mobility comments: rolling for pad placement; from left side, assisted to lower legs over EOB, HOB elevated to initiate side to sit and come to sit +2 max; guiding upper body with sit to side and assist elevating bil LEs    Transfers Overall transfer level: Needs assistance Equipment used: 2 person hand held assist Transfers: Sit to/from Stand Sit to Stand: Min assist, Mod assist, From elevated surface           General transfer comment: bed elevated ~4; initial stand with +2 mod assist (pt hooking elbows with each therapist and use of bed pad); second attempt +2 min assist with better upright posture obtained    Ambulation/Gait             Pre-gait activities: lateral wt-shifting x 5 each direction with bil UE support    Stairs            Wheelchair Mobility     Tilt Bed    Modified Rankin (Stroke Patients Only)       Balance Overall balance assessment: Needs assistance Sitting-balance support: Feet supported, Bilateral upper extremity supported Sitting balance-Leahy Scale: Poor Sitting balance - Comments: tendency to lean posterior and to left Postural control: Posterior  lean, Left lateral lean Standing balance support: Bilateral upper extremity supported, During functional activity Standing balance-Leahy Scale: Poor                               Pertinent  Vitals/Pain Pain Assessment Pain Assessment: 0-10 Pain Score: 6  Pain Location: R shoulder>neck Pain Descriptors / Indicators: Discomfort, Grimacing, Guarding Pain Intervention(s): Limited activity within patient's tolerance, Monitored during session, Premedicated before session, Repositioned    Home Living Family/patient expects to be discharged to:: Private residence Living Arrangements: Spouse/significant other;Children Available Help at Discharge: Family;Available 24 hours/day Type of Home: House Home Access: Ramped entrance       Home Layout: One level Home Equipment: Cane - single point;Wheelchair - manual      Prior Function Prior Level of Function : Independent/Modified Independent;History of Falls (last six months)             Mobility Comments: prior to christmas; 2 falls (one slip in shower, one step off curb); then getting out of car fell backwards       Extremity/Trunk Assessment   Upper Extremity Assessment Upper Extremity Assessment: Defer to OT evaluation    Lower Extremity Assessment Lower Extremity Assessment: RLE deficits/detail;LLE deficits/detail RLE Deficits / Details: hip flexion grossly 3, knee extension 4, ankle DF 4 RLE Sensation: history of peripheral neuropathy LLE Deficits / Details: hip flexion 2+, hip extension ~3+, knee extension 3+, ankle DF 3- LLE Sensation: history of peripheral neuropathy    Cervical / Trunk Assessment Cervical / Trunk Assessment: Other exceptions Cervical / Trunk Exceptions: obesity  Communication   Communication Communication: No apparent difficulties    Cognition Arousal: Alert Behavior During Therapy: WFL for tasks assessed/performed   PT - Cognitive impairments: No apparent impairments                         Following commands: Intact       Cueing Cueing Techniques: Verbal cues     General Comments General comments (skin integrity, edema, etc.): supine BP 114/34 (58); donned SCD  128/45 (66); progressed to sitting with MAP appropriately increasing to 70s; HR max 112 after standing    Exercises General Exercises - Lower Extremity Ankle Circles/Pumps: AROM, Both, 20 reps, Supine, Seated   Assessment/Plan    PT Assessment Patient needs continued PT services  PT Problem List Decreased strength;Decreased activity tolerance;Decreased balance;Decreased mobility;Decreased knowledge of use of DME;Decreased knowledge of precautions;Impaired sensation;Obesity;Pain;Cardiopulmonary status limiting activity       PT Treatment Interventions Functional mobility training;Therapeutic activities;Therapeutic exercise;Balance training;Patient/family education;DME instruction;Gait training;Stair training;Neuromuscular re-education;Wheelchair mobility training    PT Goals (Current goals can be found in the Care Plan section)  Acute Rehab PT Goals Patient Stated Goal: go home ASAP PT Goal Formulation: With patient Time For Goal Achievement: 01/24/25 Potential to Achieve Goals: Good    Frequency Min 3X/week     Co-evaluation PT/OT/SLP Co-Evaluation/Treatment: Yes Reason for Co-Treatment: Complexity of the patient's impairments (multi-system involvement);For patient/therapist safety;To address functional/ADL transfers           AM-PAC PT 6 Clicks Mobility  Outcome Measure Help needed turning from your back to your side while in a flat bed without using bedrails?: A Lot Help needed moving from lying on your back to sitting on the side of a flat bed without using bedrails?: Total Help needed moving to and from a bed to a chair (including a wheelchair)?:  Total Help needed standing up from a chair using your arms (e.g., wheelchair or bedside chair)?: Total Help needed to walk in hospital room?: Total Help needed climbing 3-5 steps with a railing? : Total 6 Click Score: 7    End of Session   Activity Tolerance: Patient tolerated treatment well Patient left: in bed;with call  bell/phone within reach;with bed alarm set;with family/visitor present;with SCD's reapplied Nurse Communication: Mobility status PT Visit Diagnosis: Repeated falls (R29.6);Muscle weakness (generalized) (M62.81);Difficulty in walking, not elsewhere classified (R26.2)    Time: 9053-8976 PT Time Calculation (min) (ACUTE ONLY): 37 min   Charges:   PT Evaluation $PT Re-evaluation: 1 Re-eval   PT General Charges $$ ACUTE PT VISIT: 1 Visit          Macario RAMAN, PT Acute Rehabilitation Services  Office [phone]   Macario SHAUNNA Soja 01/10/2025, 10:40 AM

## 2025-01-10 NOTE — Progress Notes (Signed)
 " PROGRESS NOTE    April Warren  FMW:[id number] DOB: 13-Nov-1971 DOA: 01/04/2025 PCP: [name] LABOR, MD  Chief Complaint  Patient presents with   Fall    Brief Narrative:    April Warren is April Warren 54 y.o. female with medical history significant for hypertension, diabetes, CKD and other medical issues who presented with falls and worsening weakness.   Imaging showed findings concerning for spinal canal stenosis and cord compression of cervical spine.   Now s/p neurosurgery 1/12.  Plan is for CIR.   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  Moderate to severe spinal canal stenosis with cord compression at C5-6 with possible cord signal abnormality favoring myelomalacia.  Moderate spinal canal stenosis at C2-3 without cord signal abnormality.  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 (See report) Appreciate neurosurgery evaluation  Now s/p surgery - decompressive cervical laminectomy medial facetectomy and foraminotomies C2-3, C3-4, C4-5, C5-6.  Posterior lateral arthrodesis c2-3, C3-4, C4-5, C5-6, C6-7.  Segmental lateral mass fixation C2-C7.  1/12. PT/OT - plan for CIR   Recent acute bronchitis Negative covid, flu, RSV CT from 1/1 without acute intrathoracic pathology Doxycycline    Diarrhea She notes diarrhea commonly while on abx Monitor  Uncontrolled diabetes mellitus with hypoglycemia  A1c 8.1 08/2024 A1c 7.1 Holding home oral meds SSI for now - basal/bolus given steroid induced hyperglycemia - expect her insulin  need to significantly reduce with reduced/discontinued steroids.   Hypertension Holding metoprolol ,  lisinopril , triamterene -HCTZ.   CKD 3 April Warren- creatinine 1.6 at presentation, baseline creatinine ranges between 1.1-1.5 At baseline  Urinary Retention Catheter in place since 1/8.  Will plan for TOV 1/14.      DVT prophylaxis: SCD Code Status: full Family Communication: husband at bedside Disposition:   Status is: Observation The patient will require care spanning > 2 midnights and should be moved to inpatient because: need to continue inpatient care   Consultants:  neurosurgery  Procedures:  none  Antimicrobials:  Anti-infectives (From admission, onward)    Start     Dose/Rate Route Frequency Ordered Stop   01/09/25 2100  ceFAZolin  (ANCEF ) IVPB 2g/100 mL premix        2 g 200 mL/hr over 30 Minutes Intravenous Every 8 hours 01/09/25 1745 01/10/25 0517   01/09/25 1100  ceFAZolin  (ANCEF ) IVPB 2g/100 mL premix        2 g 200 mL/hr over 30 Minutes Intravenous On call to O.R. 01/09/25 1013 01/09/25 1332   01/04/25 2215  doxycycline  (VIBRA -TABS) tablet 100 mg        100 mg Oral 2 times daily 01/04/25 2127         Subjective: No complaints  Objective: Vitals:   01/10/25 0420 01/10/25 0510 01/10/25 0700 01/10/25 1213  BP: (!) 135/50   (!) 131/54  Pulse: (!) 48   71  Resp: 13 17 16 15   Temp: 97.8 F (36.6 C)   98.4 F (36.9 C)  TempSrc: Oral   Oral  SpO2: 98%   95%  Weight:      Height:        Intake/Output Summary (Last 24 hours) at 01/10/2025 1432 Last data filed at  01/10/2025 0900 Gross per 24 hour  Intake 3035.83 ml  Output 2595 ml  Net 440.83 ml   Filed Weights   01/04/25 1011 01/09/25 1057  Weight: 124 kg 124 kg    Examination:  General: No acute distress. Cardiovascular: RRR Lungs: unlabored Neurological: Alert and oriented 3. Improved and symmetric strength today. Extremities: No clubbing or cyanosis. No edema.  Data Reviewed: I have personally reviewed following labs and imaging studies  CBC: Recent Labs  Lab 01/04/25 1114 01/05/25 0231  01/06/25 0157 01/07/25 0154 01/08/25 0206 01/09/25 0134 01/10/25 0209  WBC 9.0   < > 7.6 8.7 7.6 10.9* 12.0*  NEUTROABS 6.0  --   --   --   --  8.8* 9.9*  HGB 11.5*   < > 11.4* 12.2 12.7 12.4 11.4*  HCT 37.6   < > 35.1* 36.8 37.6 37.5 34.0*  MCV 98.7   < > 94.6 92.7 92.4 92.6 92.9  PLT 306   < > 278 312 317 378 319   < > = values in this interval not displayed.    Basic Metabolic Panel: Recent Labs  Lab 01/04/25 1114 01/05/25 0231 01/06/25 0157 01/07/25 0154 01/08/25 0206 01/09/25 0134 01/10/25 0209  NA 143   < > 138 136 136 137 134*  K 5.3*   < > 4.1 4.1 4.5 4.7 4.8  CL 109   < > 106 102 102 102 101  CO2 21*   < > 23 23 22 22 22   GLUCOSE 74   < > 145* 157* 226* 257* 279*  BUN 51*   < > 29* 29* 32* 52* 49*  CREATININE 1.69*   < > 1.07* 1.06* 0.98 1.49* 1.18*  CALCIUM  8.8*   < > 9.0 9.3 9.6 9.7 8.7*  MG 2.0  --  1.6* 1.6*  --  1.8 1.7  PHOS  --   --  2.5 2.5  --  3.2 4.2   < > = values in this interval not displayed.    GFR: Estimated Creatinine Clearance: 76.8 mL/min (April Warren) (by C-G formula based on SCr of 1.18 mg/dL (H)).  Liver Function Tests: Recent Labs  Lab 01/09/25 0134 01/10/25 0209  AST 26 21  ALT 23 18  ALKPHOS 78 66  BILITOT 0.5 0.4  PROT 7.0 6.0*  ALBUMIN 3.9 3.5    CBG: Recent Labs  Lab 01/09/25 2007 01/09/25 2327 01/10/25 0435 01/10/25 0840 01/10/25 1149  GLUCAP 248* 274* 251* 225* 345*     Recent Results (from the past 240 hours)  Resp panel by RT-PCR (RSV, Flu April Warren&B, Covid) Anterior Nasal Swab     Status: None   Collection Time: 01/04/25 11:36 AM   Specimen: Anterior Nasal Swab  Result Value Ref Range Status   SARS Coronavirus 2 by RT PCR NEGATIVE NEGATIVE Final    Comment: (NOTE) SARS-CoV-2 target nucleic acids are NOT DETECTED.  The SARS-CoV-2 RNA is generally detectable in upper respiratory specimens during the acute phase of infection. The lowest concentration of SARS-CoV-2 viral copies this assay can detect is 138 copies/mL.  Langley Flatley negative result does not preclude SARS-Cov-2 infection and should not be used as the sole basis for treatment or other patient management decisions. April Warren negative result may occur with  improper specimen collection/handling, submission of specimen other than nasopharyngeal swab, presence of viral mutation(s) within the areas targeted by this assay, and inadequate number of viral copies(<138 copies/mL). April Warren negative result must be combined with clinical observations, patient history, and epidemiological  information. The expected result is Negative.  Fact Sheet for Patients:  bloggercourse.com  Fact Sheet for Healthcare Providers:  seriousbroker.it  This test is no t yet approved or cleared by the [place]  FDA and  has been authorized for detection and/or diagnosis of SARS-CoV-2 by FDA under an Emergency Use Authorization (EUA). This EUA will remain  in effect (meaning this test can be used) for the duration of the COVID-19 declaration under Section 564(b)(1) of the Act, 21 U.S.C.section 360bbb-3(b)(1), unless the authorization is terminated  or revoked sooner.       Influenza April Warren by PCR NEGATIVE NEGATIVE Final   Influenza B by PCR NEGATIVE NEGATIVE Final    Comment: (NOTE) The Xpert Xpress SARS-CoV-2/FLU/RSV plus assay is intended as an aid in the diagnosis of influenza from Nasopharyngeal swab specimens and should not be used as April Warren sole basis for treatment. Nasal washings and aspirates are unacceptable for Xpert Xpress SARS-CoV-2/FLU/RSV testing.  Fact Sheet for Patients: bloggercourse.com  Fact Sheet for Healthcare Providers: seriousbroker.it  This test is not yet approved or cleared by the [place]  FDA and has been authorized for detection and/or diagnosis of SARS-CoV-2 by FDA under an Emergency Use Authorization (EUA). This EUA will remain in effect (meaning this test  can be used) for the duration of the COVID-19 declaration under Section 564(b)(1) of the Act, 21 U.S.C. section 360bbb-3(b)(1), unless the authorization is terminated or revoked.     Resp Syncytial Virus by PCR NEGATIVE NEGATIVE Final    Comment: (NOTE) Fact Sheet for Patients: bloggercourse.com  Fact Sheet for Healthcare Providers: seriousbroker.it  This test is not yet approved or cleared by the [place]  FDA and has been authorized for detection and/or diagnosis of SARS-CoV-2 by FDA under an Emergency Use Authorization (EUA). This EUA will remain in effect (meaning this test can be used) for the duration of the COVID-19 declaration under Section 564(b)(1) of the Act, 21 U.S.C. section 360bbb-3(b)(1), unless the authorization is terminated or revoked.  Performed at [facility], [address], [place], [place] [address]   Surgical pcr screen     Status: None   Collection Time: 01/07/25  5:17 PM   Specimen: Nasal Mucosa; Nasal Swab  Result Value Ref Range Status   MRSA, PCR NEGATIVE NEGATIVE Final   Staphylococcus aureus NEGATIVE NEGATIVE Final    Comment: (NOTE) The Xpert SA Assay (FDA approved for NASAL specimens in patients 37 years of age and older), is one component of Xzandria Clevinger comprehensive surveillance program. It is not intended to diagnose infection nor to guide or monitor treatment. Performed at [facility], [address], [place], [place] [address]          Radiology Studies: DG Cervical Spine 2 or 3 views Result Date: 01/10/2025 EXAM: FLUOROSCOPIC IMAGING TECHNIQUE: Fluoroscopy was provided by the radiology department for procedure. Radiologist was not present during examination. RADIATION DOSE INDEX: Fluoro time: 23.5 seconds. Reference Air Kerma: 5.22 mGy. COMPARISON: MRI cervical spine dated 04/15/2023. CT cervical spine dated 01/02/2025. CLINICAL HISTORY: [id number] Surgery, elective [id number]. FINDINGS:  Intraoperative fluoroscopic imaging was performed. Intraoperative placement of posterior cervical fusion and foraminotomy at the C2 to C7 levels. April Warren total of 4 intraoperative low-resolution images of the cervical spine were obtained. IMPRESSION: 1. Intraoperative fluoroscopic imaging as above. Please refer to the operative report for full details. Electronically signed by: Morgane Naveau MD MD 01/10/2025 12:46 AM EST RP Workstation: [id number]   DG C-Arm 1-60 Min-No Report Result Date: 01/09/2025 Fluoroscopy was  utilized by the requesting physician.  No radiographic interpretation.   DG C-Arm 1-60 Min-No Report Result Date: 01/09/2025 Fluoroscopy was utilized by the requesting physician.  No radiographic interpretation.   DG C-Arm 1-60 Min-No Report Result Date: 01/09/2025 Fluoroscopy was utilized by the requesting physician.  No radiographic interpretation.         Scheduled Meds:  atorvastatin   80 mg Oral Daily   Chlorhexidine  Gluconate Cloth  6 each Topical Daily   dexamethasone  (DECADRON ) injection  4 mg Intravenous Q6H   Or   dexamethasone   4 mg Oral Q6H   doxycycline   100 mg Oral BID   insulin  aspart  0-15 Units Subcutaneous TID WC   insulin  aspart  0-5 Units Subcutaneous QHS   insulin  aspart  4 Units Subcutaneous TID WC   insulin  glargine  18 Units Subcutaneous Q24H   senna  1 tablet Oral BID   sodium chloride  flush  3 mL Intravenous Q12H   Continuous Infusions:  sodium chloride      0.9 % NaCl with KCl 20 mEq / L 75 mL/hr at 01/10/25 1105     LOS: 5 days    Time spent: over 30 min     Meliton Monte, MD [facility]   To contact the attending provider between 7A-7P or the covering provider during after hours 7P-7A, please log into the web site [email or web address] and access using universal [place] password for that web site. If you do not have the password, please call the hospital operator.  01/10/2025, 2:32 PM    "

## 2025-01-10 NOTE — PMR Pre-admission (Signed)
 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  Updates due to  fax listed above.        Employer:  Benefits:  Phone #: online-[employer]-[phone]     Name: 1/13 Eff. Date: 01/10/2019 with no end date listed     Deduct: $150      Out of Pocket Max: (catastrophic cap) $3000      Life Max: none CIR: $250 per day up to 25% of hospital charges ( whichever is less), plus 20% of separated billed services      SNF: $250 per day or up to 25% of hospital charges ( whichever is less), plus 20% of separately billed services Outpatient: $52 per visit     Co-Pay:  Home Health: 100% coverage      Co-Pay:  DME: 80%     Co-Pay: 20% Providers: in network  SECONDARY: none      Policy#:      Phone#:   Artist:       Phone#:   The Best Boy for patients in Inpatient Rehabilitation Facilities with attached Privacy Act Statement-Health Care Records was provided and verbally reviewed with: Patient and Family  Emergency Contact Information Contact Information     Name Relation Home Work Mobile   [place] Spouse   [phone]   Alto Dagoberto Nyhan   [phone]   Floretta Eleanor Benne   [phone]      Other Contacts   None on File    Current Medical History  Patient Admitting Diagnosis: cervical myelopathy  History of Present Illness: 54 yo female with history of  DM retinopathy and HTN, and CKD. Presented to [facility] ER 01/04/25 with complaints of falls, worsening weakness over the past 2 days. Recently seen by provider 01/02/25 in ER and diagnosed with the flu, bronchitis and sinusitis and started on a course of doxycycline . Has had ongoing LLE weakness since September 2025. Transferred to [facility] on 01/04/25.  Imaging showed findings concerning for spinal canal stenosis and cord compression of cervical spine. 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 Moderate to severe spinal canal stenosis with cord compression at C5-6 with possible cord signal abnormality favoring myelomalacia. Moderate spinal canal stenosis at C2-3 without cord signal abnormality. 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 Neurosurgery consulted. Underwent C2-7 posterior laminectomy with lateral mass fusion and fixation on 1/12.   Negative Covid, flu and RSV. Hgb A1c 5.1 9/25. SSI for now and CBGS monitoring. Holding oral home meds. Holding home HTN meds of metoprolol , lisinopril , triamterene -HCTZ. Urinary retention with foley placed 1/8. Foley removed 1/14. Lovenox  for DVT prophylaxis.   Complete NIHSS TOTAL: 1  Patient's medical record  from [facility] and [facility] has been reviewed by the rehabilitation admission coordinator and physician.  Past Medical History  Past Medical History:  Diagnosis Date   Allergy    Asthma    Certain time of the year   Chicken pox 54 yrs old   Diabetes mellitus 08/12/2012   type 2- dr alvia diagnosed   Diabetic retinopathy (HCC)    Dysmenorrhea 08/20/2012   HTN (hypertension) 08/20/2012   Hyperlipidemia, mixed 08/28/2013   Neuromuscular disorder (HCC)    Not sure of date   Neuropathy 02/16/2017   Obesity    Obesity, unspecified 08/28/2013   Other and unspecified hyperlipidemia  08/28/2013   Preventative health care 08/20/2012   Sinusitis 11/14/2013   Sinusitis, acute 05/02/2016   Has the patient had major surgery during 100 days prior to admission? Yes  Family History   family history includes Alcohol  abuse in her paternal grandfather; Arthritis in her father; Asthma in her father and maternal grandmother; COPD in her father and maternal grandmother; Cancer in her brother; Cancer (age of onset: 43) in her mother; Diabetes in her mother; Gout in her brother; Heart attack in her maternal grandfather; Heart disease in her paternal grandfather; Hyperlipidemia in her father and maternal grandmother; Hypertension in her father, maternal grandfather, maternal grandmother, and mother; Stroke in her maternal grandfather.  Current Medications Current Medications[1]  Patients Current Diet:  Diet Order             Diet Carb Modified Room service appropriate? Yes  Diet effective now                  Precautions / Restrictions Precautions Precautions: Fall Precaution/Restrictions Comments: cervical Restrictions Weight Bearing Restrictions Per Provider Order: No   Has the patient had 2 or more falls or a fall with injury in the past year? Yes  Prior Activity Level Community (5-7x/wk): independent and driving  Prior Functional Level Self Care: Did the patient need help bathing, dressing, using the toilet or eating? Independent  Indoor Mobility: Did the patient need assistance with walking from room to room (with or without device)? Independent  Stairs: Did the patient need assistance with internal or external stairs (with or without device)? Independent  Functional Cognition: Did the patient need help planning regular tasks such as shopping or remembering to take medications? Independent  Patient Information Are you of Hispanic, Latino/a,or Spanish origin?: A. No, not of Hispanic, Latino/a, or Spanish origin What is your race?: A. White Do you need or want  an interpreter to communicate with a doctor or health care staff?: 0. No  Patient's Response To:  Health Literacy and Transportation Is the patient able to respond to health literacy and transportation needs?: Yes Health Literacy - How often do you need to have someone help you when you read instructions, pamphlets, or other written material from your doctor or pharmacy?: Never In the past 12 months, has lack of transportation kept you from medical appointments or from getting medications?: No In the past 12 months, has lack of transportation kept you from meetings, work, or from getting things needed for daily living?: No  Journalist, Newspaper / Equipment Home Equipment: [place] - single point, Wheelchair - manual  Prior Device Use: Indicate devices/aids used by the patient prior to current illness, exacerbation or injury? None of the above  Current Functional Level Cognition  Orientation Level: Oriented X4    Extremity Assessment (includes Sensation/Coordination)  Upper Extremity Assessment: Right hand dominant, RUE  deficits/detail, LUE deficits/detail RUE Deficits / Details: shoulder flexion 90 degrees, grip strength 4 out 5, sensation changes with tingling today, pt with decreased grasp compared to baseline. pt provided red foam and able to hold fork and cut muffin this session RUE Sensation: decreased light touch RUE Coordination: decreased fine motor LUE Deficits / Details: shoulder flexion 70 degrees. using R UE able to achieve 80 degrees AAROM flexion shoulder. pt decrease grasp 3 out 5 with decreased composite grasp. Pt at baseline has pip flexion 5th digit and 2nd digit turns inward slight. tingling and decrease sensation compared to RUE LUE Sensation: decreased light touch LUE Coordination: decreased fine motor  Lower Extremity Assessment: Defer to PT evaluation RLE Deficits / Details: hip flexion grossly 3, knee extension 4, ankle DF 4 RLE Sensation: history of peripheral  neuropathy LLE Deficits / Details: hip flexion 2+, hip extension ~3+, knee extension 3+, ankle DF 3- LLE Sensation: history of peripheral neuropathy    ADLs  Overall ADL's : Needs assistance/impaired Eating/Feeding: Set up, With adaptive utensils Eating/Feeding Details (indicate cue type and reason): patient states she is able to feed self with built up utensils Grooming: Minimal assistance, Bed level Upper Body Bathing: Moderate assistance, Bed level Lower Body Bathing: Total assistance Upper Body Dressing : Moderate assistance Lower Body Dressing: Maximal assistance, Bed level Lower Body Dressing Details (indicate cue type and reason): socks General ADL Comments: pt able to complete self feeding of a muffin this sesison with red foam fork    Mobility  Overal bed mobility: Needs Assistance Bed Mobility: Sit to Sidelying Rolling: Mod assist Sidelying to sit: +2 for physical assistance, HOB elevated, Max assist Sit to sidelying: Min assist General bed mobility comments: assist to raise legs onto bed    Transfers  Overall transfer level: Needs assistance Equipment used: Rolling walker (2 wheels) Transfers: Sit to/from Stand Sit to Stand: Min assist, +2 physical assistance, Mod assist Bed to/from chair/wheelchair/BSC transfer type:: Step pivot Step pivot transfers: Min assist, +2 physical assistance General transfer comment: cues for hand placement each time;  Sit to stand  from recliner to RW and to sink varied min to mod assist    Ambulation / Gait / Stairs / Wheelchair Mobility  Ambulation/Gait Ambulation/Gait assistance: Min assist, +2 physical assistance, +2 safety/equipment Gait Distance (Feet): 6 Feet (seated rest, 6 ft) Assistive device: Rolling walker (2 wheels) Gait Pattern/deviations: Step-to pattern, Decreased stride length, Decreased dorsiflexion - right, Decreased dorsiflexion - left, Knee hyperextension - right, Knee hyperextension - left, Shuffle, Narrow base of  support General Gait Details: steadying assist with initial chair follow; second walk no chair follow needed Gait velocity interpretation: <1.31 ft/sec, indicative of household ambulator Pre-gait activities: lateral wt-shifting x 5 each direction with bil UE support    Posture / Balance Dynamic Sitting Balance Sitting balance - Comments: tends to posteriorly lean and able to correct with verbal cues Balance Overall balance assessment: Needs assistance Sitting-balance support: Feet supported Sitting balance-Leahy Scale: Fair Sitting balance - Comments: tends to posteriorly lean and able to correct with verbal cues Postural control: Posterior lean, Left lateral lean Standing balance support: During functional activity, No upper extremity supported Standing balance-Leahy Scale: Poor Standing balance comment: bimanual tasks at sink with pelvis against counter; stood for several minutes    Special considerations/life events  Hgb A1c 8.1 9/25 Fall precautions Indwelling catheter place 1/8 and removed 1/14   Previous Home Environment  Living Arrangements: Spouse/significant other, Children (5 children all under the age of  31 yo)  Lives With: Spouse, Family Available Help at Discharge: Family, Available 24 hours/day Type of Home: House Home Layout: One level Home Access: Ramped entrance Bathroom Shower/Tub: Tub/shower unit, Engineer, Building Services: Handicapped height Bathroom Accessibility: Yes How Accessible: Accessible via walker Home Care Services: No Additional Comments: they have 5 children in the home 3yo- 42 yo adopted.  Discharge Living Setting Plans for Discharge Living Setting: Patient's home, House, Lives with (comment) (spouse and 5 children) Type of Home at Discharge: House Discharge Home Layout: One level Discharge Home Access: Ramped entrance Discharge Bathroom Shower/Tub: Tub/shower unit, Curtain Discharge Bathroom Toilet: Handicapped height Discharge Bathroom  Accessibility: Yes How Accessible: Accessible via walker Does the patient have any problems obtaining your medications?: No  Social/Family/Support Systems Patient Roles: Spouse, Parent Contact Information: spouse, [place] Anticipated Caregiver: spouse Anticipated Industrial/product Designer Information: see contacts Ability/Limitations of Caregiver: disabled veteran but can provide supervision to min assist Caregiver Availability: 24/7 Discharge Plan Discussed with Primary Caregiver: Yes Is Caregiver In Agreement with Plan?: Yes Does Caregiver/Family have Issues with Lodging/Transportation while Pt is in Rehab?: No  Spouse disabled with history of TBI 34 years ago  Goals Patient/Family Goal for Rehab: supervision to min with PT and OT Expected length of stay: ELOS 3.5 to 4 weeks Pt/Family Agrees to Admission and willing to participate: Yes Program Orientation Provided & Reviewed with Pt/Caregiver Including Roles  & Responsibilities: Yes  Decrease burden of Care through IP rehab admission: n/a  Possible need for SNF placement upon discharge: not anticipated  Patient Condition: This patient's condition remains as documented in the consult dated 01/10/25, in which the Rehabilitation Physician determined and documented that the patient's condition is appropriate for intensive rehabilitative care in an inpatient rehabilitation facility. Will admit to inpatient rehab today.  Preadmission Screen Completed By:  Alison Heron Lot, RN MSN 01/12/2025 10:51 AM ______________________________________________________________________   Discussed status with Dr. Joseantonio Dittmar on 01/12/25 at 1047 and received approval for admission today.  Admission Coordinator:  Alison Heron Lot, RN MSN time 1047 Date 01/12/25   Assessment/Plan: Diagnosis: C4 ASIA D traumatic quadriplegia/incomplete Does the need for close, 24 hr/day Medical supervision in concert with the patient's rehab needs make it unreasonable for  this patient to be served in a less intensive setting? Yes Co-Morbidities requiring supervision/potential complications: Neurogenic bowel and bladder, spasticity, CKD, HTN with new OH; DM with retinopathy and neuropathy; post op pain Due to bladder management, bowel management, safety, skin/wound care, disease management, medication administration, pain management, and patient education, does the patient require 24 hr/day rehab nursing? Yes Does the patient require coordinated care of a physician, rehab nurse, PT, OT to address physical and functional deficits in the context of the above medical diagnosis(es)? Yes Addressing deficits in the following areas: balance, endurance, locomotion, strength, transferring, bowel/bladder control, bathing, dressing, feeding, grooming, and toileting Can the patient actively participate in an intensive therapy program of at least 3 hrs of therapy 5 days a week? Yes The potential for patient to make measurable gains while on inpatient rehab is good Anticipated functional outcomes upon discharge from inpatient rehab: supervision and min assist PT, supervision and min assist OT, n/a SLP Estimated rehab length of stay to reach the above functional goals is: 3.5 to 4 weeks Anticipated discharge destination: Home 10. Overall Rehab/Functional Prognosis: good   MD Signature:      [1]  Current Facility-Administered Medications:    artificial tears ophthalmic solution 1 drop, 1 drop, Both Eyes, TID PRN, Joshua Alm Hamilton,  MD   atorvastatin  (LIPITOR ) tablet 80 mg, 80 mg, Oral, Daily, Joshua Alm Hamilton, MD, 80 mg at 01/12/25 9165   baclofen  (LIORESAL ) tablet 5 mg, 5 mg, Oral, BID, Zaxton Angerer, MD, 5 mg at 01/12/25 9165   Chlorhexidine  Gluconate Cloth 2 % PADS 6 each, 6 each, Topical, Daily, Joshua Alm Hamilton, MD, 6 each at 01/12/25 [phone]   dexamethasone  (DECADRON ) injection 1 mg, 1 mg, Intravenous, Q6H **OR** dexamethasone  (DECADRON ) tablet 1 mg, 1 mg, Oral, Q6H,  Meyran, Suzen Lacks, NP, 1 mg at 01/12/25 1046   doxycycline  (VIBRA -TABS) tablet 100 mg, 100 mg, Oral, BID, Joshua Alm Hamilton, MD, 100 mg at 01/12/25 9165   HYDROmorphone  (DILAUDID ) injection 0.5 mg, 0.5 mg, Intravenous, Q2H PRN, Joshua Alm Hamilton, MD, 0.5 mg at 01/09/25 1949   insulin  aspart (novoLOG ) injection 0-15 Units, 0-15 Units, Subcutaneous, TID WC, Perri DELENA Meliton Mickey., MD, 3 Units at 01/12/25 0700   insulin  aspart (novoLOG ) injection 0-5 Units, 0-5 Units, Subcutaneous, QHS, Perri DELENA Meliton Mickey., MD, 2 Units at 01/11/25 2219   insulin  aspart (novoLOG ) injection 7 Units, 7 Units, Subcutaneous, TID WC, Amin, Ankit C, MD, 7 Units at 01/11/25 1748   insulin  glargine (LANTUS ) injection 30 Units, 30 Units, Subcutaneous, Daily, Amin, Ankit C, MD, 30 Units at 01/12/25 1046   LORazepam  (ATIVAN ) tablet 0.5 mg, 0.5 mg, Oral, BID PRN, Joshua Alm Hamilton, MD, 0.5 mg at 01/08/25 2118   menthol  (CEPACOL) lozenge 3 mg, 1 lozenge, Oral, PRN **OR** phenol (CHLORASEPTIC) mouth spray 1 spray, 1 spray, Mouth/Throat, PRN, Joshua Alm Hamilton, MD   methocarbamol  (ROBAXIN ) tablet 500 mg, 500 mg, Oral, Q6H PRN, 500 mg at 01/12/25 0938 **OR** methocarbamol  (ROBAXIN ) injection 500 mg, 500 mg, Intravenous, Q6H PRN, Joshua Alm Hamilton, MD   ondansetron  (ZOFRAN ) tablet 4 mg, 4 mg, Oral, Q6H PRN **OR** ondansetron  (ZOFRAN ) injection 4 mg, 4 mg, Intravenous, Q6H PRN, Joshua Alm Hamilton, MD   Oral care mouth rinse, 15 mL, Mouth Rinse, PRN, Joshua Alm Hamilton, MD   oxyCODONE  (Oxy IR/ROXICODONE ) immediate release tablet 10 mg, 10 mg, Oral, Q3H PRN, Joshua Alm Hamilton, MD, 10 mg at 01/12/25 9061   senna (SENOKOT) tablet 8.6 mg, 1 tablet, Oral, BID, Joshua Alm Hamilton, MD, 8.6 mg at 01/12/25 9165   sodium chloride  flush (NS) 0.9 % injection 3 mL, 3 mL, Intravenous, Q12H, Joshua Alm Hamilton, MD, 3 mL at 01/12/25 0835   sodium chloride  flush (NS) 0.9 % injection 3 mL, 3 mL, Intravenous, PRN, Joshua Alm Hamilton, MD

## 2025-01-10 NOTE — Progress Notes (Signed)
" °  Inpatient Rehabilitation Admissions Coordinator   Met with patient and spouse at bedside for rehab assessment. We discussed goals and expectations of a possible CIR admit. They prefer CIR for rehab. Family can provide expected caregiver support that is recommended. I await  Dr Cornelio to consult and then will begin insurance Auth with [employer] for possible CIR admit pending approval. Please call me with any questions.   Heron Leavell, RN, MSN Rehab Admissions Coordinator [phone]   "

## 2025-01-10 NOTE — Care Management Important Message (Signed)
 Important Message  Patient Details  Name: April Warren MRN: [id number] Date of Birth: 1971/07/16   Important Message Given:  Yes - [employer] IM     [name] 01/10/2025, 2:37 PM

## 2025-01-10 NOTE — Progress Notes (Signed)
 SPIRITUAL CARE AND COUNSELING CONSULT NOTE   VISIT SUMMARY Patient requested prayer before surgery and support for anxiety and worry. Chaplain prayed with patient, her husband, and medical staff present.   SPIRITUAL ENCOUNTER                                                                                                                                                                      Type of Visit: Initial Care provided to:: Pt and family Referral source: Patient request Reason for visit:  (Request for prayer before surgery) OnCall Visit: No   SPIRITUAL FRAMEWORK  Patient Stress Factors: Health changes Family Stress Factors: Health changes   GOALS       INTERVENTIONS   Spiritual Care Interventions Made: Compassionate presence, Reflective listening    INTERVENTION OUTCOMES   Outcomes: Connection to spiritual care  SPIRITUAL CARE PLAN        If immediate needs arise, please contact [facility] 24 hour on call [phone]   Roxanne Slade  01/09/2025 1100

## 2025-01-10 NOTE — Evaluation (Signed)
 Occupational Therapy re-Evaluation Patient Details Name: April Warren MRN: [id number] DOB: [birth year] Today's Date: 01/10/2025   History of Present Illness   54 year old female presented to ED 01/04/25 s/p falls and increasing weakness x 2 days.  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 1/12 PLA C2-3 C3-4 C4-5 C5-6 C6-7 segmental lateral mass fixation C2-C7 utilizing ATEC lateral mass screws PMH significant for diabetic retinopathy, HTN     Clinical Impressions Patient is s/p PLA C2-7 surgery resulting in functional limitations due to the deficits listed below (see OT problem list). Pt at this time could benefit from ted hose for BP management during session. We utilized SCDs during session to sustain MAP >60. Pt tolerated EOB activity and sit<>Stand x2. Pt demonstrates L side weakness > R side.  Patient will benefit from skilled OT acutely to increase independence and safety with ADLS to allow discharge intensive inpatient follow-up therapy, >3 hours/day . Please order TED HOSE Please place referral to [facility] with Dr Lovorn for post acute followup. The clinic has an extensive wait list and pt could benefit from referral now to allow for appropriate follow up.      If plan is discharge home, recommend the following:   Two people to help with walking and/or transfers;Two people to help with bathing/dressing/bathroom     Functional Status Assessment   Patient has had a recent decline in their functional status and demonstrates the ability to make significant improvements in function in a reasonable and predictable amount of time.     Equipment Recommendations   Wheelchair (measurements OT);Wheelchair cushion (measurements OT);Hospital bed;BSC/3in1     Recommendations for Other Services   Rehab consult     Precautions/Restrictions    Precautions Precautions: Fall Recall of Precautions/Restrictions: Intact Restrictions Weight Bearing Restrictions Per Provider Order: No     Mobility Bed Mobility Overal bed mobility: Needs Assistance Bed Mobility: Rolling, Sidelying to Sit, Sit to Sidelying Rolling: Mod assist Sidelying to sit: +2 for physical assistance, HOB elevated, Max assist     Sit to sidelying: Mod assist, +2 for physical assistance General bed mobility comments: rolling for pad placement; from left side, assisted to lower legs over EOB, HOB elevated to initiate side to sit and come to sit +2 max; guiding upper body with sit to side and assist elevating bil LEs    Transfers Overall transfer level: Needs assistance Equipment used: 2 person hand held assist Transfers: Sit to/from Stand Sit to Stand: Min assist, Mod assist, From elevated surface           General transfer comment: bed elevated ~4; initial stand with +2 mod assist (pt hooking elbows with each therapist and use of bed pad); second attempt +2 min assist with better upright posture obtained      Balance Overall balance assessment: Needs assistance Sitting-balance support: Feet supported, Bilateral upper extremity supported Sitting balance-Leahy Scale: Poor Sitting balance - Comments: tendency to lean posterior and to left- HOB increased initially to allow for support of this L side weakness Postural control: Posterior lean, Left lateral lean Standing balance support: Bilateral upper extremity supported, During functional activity Standing balance-Leahy Scale: Poor                             ADL either performed or assessed with clinical judgement   ADL Overall ADL's : Needs assistance/impaired Eating/Feeding: Minimal assistance;Bed level  Grooming: Minimal assistance;Bed level   Upper Body Bathing: Moderate assistance;Bed level   Lower Body Bathing: Total assistance                         General ADL  Comments: pt able to complete self feeding of a muffin this sesison with red foam fork     Vision Baseline Vision/History: 5 Retinopathy Ability to See in Adequate Light: 0 Adequate       Perception         Praxis         Pertinent Vitals/Pain Pain Assessment Pain Assessment: 0-10 Pain Score: 6  Pain Location: R shoulder>neck Pain Descriptors / Indicators: Discomfort, Grimacing, Guarding Pain Intervention(s): Monitored during session, Repositioned, Limited activity within patient's tolerance     Extremity/Trunk Assessment Upper Extremity Assessment Upper Extremity Assessment: Right hand dominant;RUE deficits/detail;LUE deficits/detail RUE Deficits / Details: shoulder flexion 90 degrees, grip strength 4 out 5, sensation changes with tingling today, pt with decreased grasp compared to baseline. pt provided red foam and able to hold fork and cut muffin this session RUE Sensation: decreased light touch RUE Coordination: decreased fine motor LUE Deficits / Details: shoulder flexion 70 degrees. using R UE able to achieve 80 degrees AAROM flexion shoulder. pt decrease grasp 3 out 5 with decreased composite grasp. Pt at baseline has pip flexion 5th digit and 2nd digit turns inward slight. tingling and decrease sensation compared to RUE LUE Sensation: decreased light touch LUE Coordination: decreased fine motor   Lower Extremity Assessment Lower Extremity Assessment: Defer to PT evaluation RLE Deficits / Details: hip flexion grossly 3, knee extension 4, ankle DF 4 RLE Sensation: history of peripheral neuropathy LLE Deficits / Details: hip flexion 2+, hip extension ~3+, knee extension 3+, ankle DF 3- LLE Sensation: history of peripheral neuropathy   Cervical / Trunk Assessment Cervical / Trunk Assessment: Other exceptions Cervical / Trunk Exceptions: obesity   Communication Communication Communication: No apparent difficulties   Cognition Arousal: Alert Behavior During Therapy:  WFL for tasks assessed/performed Cognition: No apparent impairments                               Following commands: Intact       Cueing  General Comments   Cueing Techniques: Verbal cues  supine BP 114/34 (58); donned SCD 128/45 (66); progressed to sitting with MAP appropriately increasing to 70s; HR max 112 after standing   Exercises Exercises: General Lower Extremity General Exercises - Lower Extremity Ankle Circles/Pumps: AROM, Both, 20 reps, Supine, Seated Other Exercises Other Exercises: scapula retraction 15 reps, scapula elevation 10 reps, pt reports decreased R shoulder pain with movement this session   Shoulder Instructions      Home Living Family/patient expects to be discharged to:: Private residence Living Arrangements: Spouse/significant other;Children Available Help at Discharge: Family;Available 24 hours/day Type of Home: House Home Access: Ramped entrance     Home Layout: One level     Bathroom Shower/Tub: Tub/shower unit;Curtain   Bathroom Toilet: Handicapped height Bathroom Accessibility: No   Home Equipment: Cane - single point;Wheelchair - manual   Additional Comments: they have 5 children in the home 3yo- 62 yo adopted.      Prior Functioning/Environment Prior Level of Function : Independent/Modified Independent;History of Falls (last six months)             Mobility Comments: prior to christmas; 2 falls (one slip  in shower, one step off curb); then getting out of car fell backwards      OT Problem List: Decreased strength;Decreased range of motion;Decreased activity tolerance;Impaired balance (sitting and/or standing);Decreased safety awareness;Decreased knowledge of use of DME or AE;Decreased knowledge of precautions;Obesity;Impaired UE functional use;Pain;Impaired tone;Impaired sensation;Decreased coordination   OT Treatment/Interventions: Self-care/ADL training;Therapeutic exercise;Neuromuscular education;Energy  conservation;DME and/or AE instruction;Manual therapy;Therapeutic activities;Patient/family education;Balance training;Modalities      OT Goals(Current goals can be found in the care plan section)   Acute Rehab OT Goals Patient Stated Goal: to be able to complete everyday task again OT Goal Formulation: With patient Time For Goal Achievement: 01/20/25 Potential to Achieve Goals: Good   OT Frequency:  Min 3X/week    Co-evaluation PT/OT/SLP Co-Evaluation/Treatment: Yes Reason for Co-Treatment: Complexity of the patient's impairments (multi-system involvement);For patient/therapist safety;To address functional/ADL transfers   OT goals addressed during session: ADL's and self-care;Strengthening/ROM      AM-PAC OT 6 Clicks Daily Activity     Outcome Measure Help from another person eating meals?: A Little Help from another person taking care of personal grooming?: A Lot Help from another person toileting, which includes using toliet, bedpan, or urinal?: Total Help from another person bathing (including washing, rinsing, drying)?: Total Help from another person to put on and taking off regular upper body clothing?: A Lot Help from another person to put on and taking off regular lower body clothing?: Total 6 Click Score: 10   End of Session Equipment Utilized During Treatment: Gait belt Nurse Communication: Mobility status;Precautions;Need for lift equipment  Activity Tolerance: Patient tolerated treatment well Patient left: in bed;with call bell/phone within reach;with bed alarm set;with family/visitor present;Other (comment);with SCD's reapplied (chair position in bed)  OT Visit Diagnosis: Unsteadiness on feet (R26.81);Muscle weakness (generalized) (M62.81)                Time: 9046-8974 OT Time Calculation (min): 32 min Charges:  OT General Charges $OT Visit: 1 Visit OT Evaluation $OT Re-eval: 1 Re-eval   Brynn, OTR/L  Acute Rehabilitation Services Office:  [phone] .   Ely Molt 01/10/2025, 11:44 AM

## 2025-01-11 LAB — CBC WITH DIFFERENTIAL/PLATELET
Abs Immature Granulocytes: 0.05 K/uL (ref 0.00–0.07)
Basophils Absolute: 0 K/uL (ref 0.0–0.1)
Basophils Relative: 0 %
Eosinophils Absolute: 0 K/uL (ref 0.0–0.5)
Eosinophils Relative: 0 %
HCT: 34.8 % — ABNORMAL LOW (ref 36.0–46.0)
Hemoglobin: 11.6 g/dL — ABNORMAL LOW (ref 12.0–15.0)
Immature Granulocytes: 1 %
Lymphocytes Relative: 12 %
Lymphs Abs: 1.3 K/uL (ref 0.7–4.0)
MCH: 30.9 pg (ref 26.0–34.0)
MCHC: 33.3 g/dL (ref 30.0–36.0)
MCV: 92.6 fL (ref 80.0–100.0)
Monocytes Absolute: 0.7 K/uL (ref 0.1–1.0)
Monocytes Relative: 7 %
Neutro Abs: 8.6 K/uL — ABNORMAL HIGH (ref 1.7–7.7)
Neutrophils Relative %: 80 %
Platelets: 272 K/uL (ref 150–400)
RBC: 3.76 MIL/uL — ABNORMAL LOW (ref 3.87–5.11)
RDW: 12 % (ref 11.5–15.5)
WBC: 10.7 K/uL — ABNORMAL HIGH (ref 4.0–10.5)
nRBC: 0 % (ref 0.0–0.2)

## 2025-01-11 LAB — COMPREHENSIVE METABOLIC PANEL WITH GFR
ALT: 13 U/L (ref 0–44)
AST: 18 U/L (ref 15–41)
Albumin: 3.4 g/dL — ABNORMAL LOW (ref 3.5–5.0)
Alkaline Phosphatase: 74 U/L (ref 38–126)
Anion gap: 9 (ref 5–15)
BUN: 35 mg/dL — ABNORMAL HIGH (ref 6–20)
CO2: 23 mmol/L (ref 22–32)
Calcium: 8.7 mg/dL — ABNORMAL LOW (ref 8.9–10.3)
Chloride: 101 mmol/L (ref 98–111)
Creatinine, Ser: 0.93 mg/dL (ref 0.44–1.00)
GFR, Estimated: >60 mL/min
Glucose, Bld: 281 mg/dL — ABNORMAL HIGH (ref 70–99)
Potassium: 5 mmol/L (ref 3.5–5.1)
Sodium: 134 mmol/L — ABNORMAL LOW (ref 135–145)
Total Bilirubin: 0.5 mg/dL (ref 0.0–1.2)
Total Protein: 6 g/dL — ABNORMAL LOW (ref 6.5–8.1)

## 2025-01-11 LAB — PHOSPHORUS: Phosphorus: 2.8 mg/dL (ref 2.5–4.6)

## 2025-01-11 LAB — GLUCOSE, CAPILLARY
Glucose-Capillary: 237 mg/dL — ABNORMAL HIGH (ref 70–99)
Glucose-Capillary: 247 mg/dL — ABNORMAL HIGH (ref 70–99)
Glucose-Capillary: 259 mg/dL — ABNORMAL HIGH (ref 70–99)
Glucose-Capillary: 267 mg/dL — ABNORMAL HIGH (ref 70–99)
Glucose-Capillary: 279 mg/dL — ABNORMAL HIGH (ref 70–99)
Glucose-Capillary: 283 mg/dL — ABNORMAL HIGH (ref 70–99)

## 2025-01-11 LAB — MAGNESIUM: Magnesium: 1.8 mg/dL (ref 1.7–2.4)

## 2025-01-11 MED ORDER — INSULIN ASPART 100 UNIT/ML IJ SOLN
7.0000 [IU] | Freq: Three times a day (TID) | INTRAMUSCULAR | Status: DC
  Administered 2025-01-11 (×2): 7 [IU] via SUBCUTANEOUS
  Filled 2025-01-11 (×3): qty 7

## 2025-01-11 MED ORDER — DEXAMETHASONE SODIUM PHOSPHATE 4 MG/ML IJ SOLN: 2.0000 mg | Freq: Four times a day (QID) | INTRAMUSCULAR | Status: DC

## 2025-01-11 MED ORDER — DEXAMETHASONE 2 MG PO TABS
2.0000 mg | ORAL_TABLET | Freq: Four times a day (QID) | ORAL | Status: DC
  Administered 2025-01-11 – 2025-01-12 (×4): 2 mg via ORAL
  Filled 2025-01-11 (×4): qty 1

## 2025-01-11 MED ORDER — INSULIN GLARGINE 100 UNIT/ML ~~LOC~~ SOLN
20.0000 [IU] | SUBCUTANEOUS | Status: DC
  Administered 2025-01-11: 20 [IU] via SUBCUTANEOUS
  Filled 2025-01-11 (×2): qty 0.2

## 2025-01-11 NOTE — Progress Notes (Addendum)
 Patient ID: April Warren, female   DOB: 1971/04/17, 54 y.o.   MRN: [id number] She looks better today.  Appropriate postoperative neck and shoulder blade soreness.  Arm seems better.  Still some numbness and tingling in the medial part of the left hand and occasionally in the right arm.  This is to be expected.  Pincer strength on the left is better and finger extension is better.  Dorsi flexion on the left is better.  She stood with physical therapy yesterday.  CIR did see her yesterday.  She is a good candidate for CIR.  I expect she will make a pretty good recovery.  Continue pain control.  Continue to mobilize with therapy.  No need for a collar since I do not believe a collar would fit her body habitus.  Will pull drain tomorrow Foley discontinued so watch for neurogenic bladder as she may retain urine No anti-coagulant for DVT prophylaxis for at least 5 days please

## 2025-01-11 NOTE — Progress Notes (Signed)
" °  Inpatient Rehabilitation Admissions Coordinator   I have insurance approval and now await CIR bed in the next 24 to 48 hrs.I met with patient and she is aware.  Heron Leavell, RN, MSN Rehab Admissions Coordinator [phone] 01/11/2025 3:30 PM    "

## 2025-01-11 NOTE — Progress Notes (Signed)
 Occupational Therapy Treatment Patient Details Name: April Warren MRN: [id number] DOB: 02-05-1971 Today's Date: 01/11/2025   History of present illness 54 year old female presented to ED 01/04/25 s/p falls and increasing weakness x 2 days.  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 1/12 PLA C2-3 C3-4 C4-5 C5-6 C6-7 segmental lateral mass fixation C2-C7 utilizing ATEC lateral mass screws PMH significant for diabetic retinopathy, HTN   OT comments  Patient received in supine and eager to participate in OT/PT session. Patient's BLE were wrapped with ACE wrap due to no compression stockings and assistance to donn socks. Patient was instructed on bed mobility with mod assist to roll and max assist +2 for side lying to sitting on EOB due to RUE shoulder pain. Patient able to stand from EOB with 2 person HHA and min assist +2.  Standing attempted from recliner to [facility] with patient unable to stand, maximove transfer back to bed recommended to nursing. Patient will benefit from intensive inpatient follow-up therapy, >3 hours/day.  Acute OT to continue to follow to address established goals to facilitate DC to next venue of care.        If plan is discharge home, recommend the following:  Two people to help with walking and/or transfers;Two people to help with bathing/dressing/bathroom   Equipment Recommendations  Wheelchair (measurements OT);Wheelchair cushion (measurements OT);Hospital bed;BSC/3in1    Recommendations for Other Services      Precautions / Restrictions Precautions Precautions: Fall Recall of Precautions/Restrictions: Intact Precaution/Restrictions Comments: cervical Restrictions Weight Bearing Restrictions Per Provider Order: No       Mobility Bed Mobility Overal bed mobility: Needs Assistance Bed Mobility: Rolling, Sidelying to Sit Rolling: Mod assist Sidelying to sit: +2 for  physical assistance, HOB elevated, Max assist       General bed mobility comments: patient able to roll to right with mod assist and required max assist +2 for sidelying to sit with assistance for BLEs and trunk with patient having complaints of right shoulder pain    Transfers Overall transfer level: Needs assistance Equipment used: 2 person hand held assist Transfers: Sit to/from Stand, Bed to chair/wheelchair/BSC Sit to Stand: Min assist, +2 physical assistance     Step pivot transfers: Min assist, +2 physical assistance     General transfer comment: cues and assistance for weight shifting.  Sit to stand attempted from recliner to [facility] for staff to assist back to recliner and patient unable to stand. Recommend maximove for back to bed     Balance Overall balance assessment: Needs assistance Sitting-balance support: Feet supported Sitting balance-Leahy Scale: Fair Sitting balance - Comments: tends to posteriorly lean and able to correct with verbal cues   Standing balance support: Bilateral upper extremity supported, During functional activity Standing balance-Leahy Scale: Poor Standing balance comment: reliant on external support for standing                           ADL either performed or assessed with clinical judgement   ADL Overall ADL's : Needs assistance/impaired Eating/Feeding: Set up;With adaptive utensils Eating/Feeding Details (indicate cue type and reason): patient states she is able to feed self with built up utensils                 Lower Body Dressing: Maximal assistance;Bed level Lower Body Dressing Details (indicate cue type and reason): socks  Extremity/Trunk Assessment              Vision       [employer] Communication: No apparent difficulties   Cognition Arousal: Alert Behavior During Therapy: WFL for tasks assessed/performed Cognition: No  apparent impairments                               Following commands: Intact        Cueing   Cueing Techniques: Verbal cues  Exercises      Shoulder Instructions       General Comments Husband present and supportive, ACE wraps applied to BLE due to no compression stockings    Pertinent Vitals/ Pain       Pain Assessment Pain Assessment: Faces Faces Pain Scale: Hurts even more Pain Location: R shoulder>neck Pain Descriptors / Indicators: Discomfort, Grimacing, Guarding Pain Intervention(s): Limited activity within patient's tolerance, Monitored during session, Premedicated before session, Repositioned  Home Living                                          Prior Functioning/Environment              Frequency  Min 3X/week        Progress Toward Goals  OT Goals(current goals can now be found in the care plan section)  Progress towards OT goals: Progressing toward goals  Acute Rehab OT Goals Patient Stated Goal: to move more OT Goal Formulation: With patient Time For Goal Achievement: 01/20/25 Potential to Achieve Goals: Good ADL Goals Pt Will Perform Eating: with set-up;sitting;with adaptive utensils Pt Will Perform Grooming: with set-up;sitting;with adaptive equipment Pt Will Perform Upper Body Bathing: with min assist;sitting Pt Will Perform Upper Body Dressing: with min assist;with adaptive equipment;sitting Pt Will Transfer to Toilet: with +2 assist;with min assist;stand pivot transfer Additional ADL Goal #1: pt will complete bed mobility with mod (A)  Plan      Co-evaluation    PT/OT/SLP Co-Evaluation/Treatment: Yes Reason for Co-Treatment: Complexity of the patient's impairments (multi-system involvement);For patient/therapist safety;To address functional/ADL transfers PT goals addressed during session: Mobility/safety with mobility;Balance OT goals addressed during session: ADL's and self-care;Strengthening/ROM       AM-PAC OT 6 Clicks Daily Activity     Outcome Measure   Help from another person eating meals?: A Little Help from another person taking care of personal grooming?: A Lot Help from another person toileting, which includes using toliet, bedpan, or urinal?: Total Help from another person bathing (including washing, rinsing, drying)?: Total Help from another person to put on and taking off regular upper body clothing?: A Lot Help from another person to put on and taking off regular lower body clothing?: Total 6 Click Score: 10    End of Session Equipment Utilized During Treatment: Gait belt  OT Visit Diagnosis: Unsteadiness on feet (R26.81);Muscle weakness (generalized) (M62.81)   Activity Tolerance Patient tolerated treatment well   Patient Left in chair;with call bell/phone within reach;with chair alarm set;with family/visitor present   Nurse Communication Mobility status;Need for lift equipment;Precautions        Time: 9150-9082 OT Time Calculation (min): 28 min  Charges: OT General Charges $OT Visit: 1 Visit OT Treatments $Self Care/Home Management : 8-22 mins  Dick Laine, OTA Acute Rehabilitation Services  Office [phone]   April Warren 01/11/2025, 10:40 AM

## 2025-01-11 NOTE — Progress Notes (Signed)
 Physical Therapy Treatment Patient Details Name: April Warren MRN: [id number] DOB: 05-10-1971 Today's Date: 01/11/2025   History of Present Illness 54 year old female presented to ED 01/04/25 s/p falls and increasing weakness x 2 days.  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 1/12 PLA C2-3 C3-4 C4-5 C5-6 C6-7 segmental lateral mass fixation C2-C7 utilizing ATEC lateral mass screws PMH significant for diabetic retinopathy, HTN    PT Comments  Patient reporting continued rt shoulder pain worse than neck pain. No TED hose yet, therefore used 4 ace wraps x 4 (2 for each leg) prior to supine to sit. BP increased 149/64 HR 92 in supine to 160/69 HR 99 in sitting. After transfer to chair HR 118 bpm. Patient stands well with +2 min assist, however stepping around to chair on her right her legs were a bit weak (although she advanced each leg herself). Attempted standing from chair with stedy and not successful. Recommend nursing use maximove for back to bed. Pad ordered.     If plan is discharge home, recommend the following: Two people to help with walking and/or transfers;Assistance with cooking/housework;Assistance with feeding;Assist for transportation;Help with stairs or ramp for entrance   Can travel by private vehicle        Equipment Recommendations  BSC/3in1;Hospital bed    Recommendations for Other Services       Precautions / Restrictions Precautions Precautions: Fall Recall of Precautions/Restrictions: Intact Precaution/Restrictions Comments: cervical Restrictions Weight Bearing Restrictions Per Provider Order: No     Mobility  Bed Mobility Overal bed mobility: Needs Assistance Bed Mobility: Rolling, Sidelying to Sit Rolling: Mod assist Sidelying to sit: +2 for physical assistance, HOB elevated, Max assist       General bed mobility comments: rolled to rt and rt side to sit  (due to chair placement in room); going to rt was more painful for pt due to rt shoulder pain; assist for all aspects of bed mobility    Transfers Overall transfer level: Needs assistance Equipment used: 2 person hand held assist Transfers: Sit to/from Stand, Bed to chair/wheelchair/BSC Sit to Stand: Min assist, +2 physical assistance   Step pivot transfers: Min assist, +2 physical assistance       General transfer comment: bed at lowest height and pt hooked arms with each therapist for sit to stand; as step-pivoting, incr difficulty moving LLE compared to RLE, however was moving it herself; attempted stand from recliner with stedy and pt unable to come up due to losing her grip on bar;    Ambulation/Gait                   Stairs             Wheelchair Mobility     Tilt Bed    Modified Rankin (Stroke Patients Only)       Balance Overall balance assessment: Needs assistance Sitting-balance support: Feet supported Sitting balance-Leahy Scale: Fair     Standing balance support: Bilateral upper extremity supported, During functional activity Standing balance-Leahy Scale: Poor                              Communication Communication Communication: No apparent difficulties  Cognition Arousal: Alert Behavior During Therapy: WFL for tasks assessed/performed   PT - Cognitive impairments: No apparent impairments  Following commands: Intact      Cueing Cueing Techniques: Verbal cues  Exercises      General Comments General comments (skin integrity, edema, etc.): Husband present and encouraging. No TED hose so wrapped bil LEs with 4 ace wraps x 2 each      Pertinent Vitals/Pain Pain Assessment Pain Assessment: Faces Faces Pain Scale: Hurts even more Pain Location: R shoulder>neck Pain Descriptors / Indicators: Discomfort, Grimacing, Guarding Pain Intervention(s): Limited activity within patient's  tolerance, Monitored during session, Premedicated before session, Repositioned    Home Living                          Prior Function            PT Goals (current goals can now be found in the care plan section) Acute Rehab PT Goals Patient Stated Goal: go home ASAP Time For Goal Achievement: 01/24/25 Potential to Achieve Goals: Good Progress towards PT goals: Progressing toward goals    Frequency    Min 3X/week      PT Plan      Co-evaluation PT/OT/SLP Co-Evaluation/Treatment: Yes Reason for Co-Treatment: Complexity of the patient's impairments (multi-system involvement);For patient/therapist safety;To address functional/ADL transfers PT goals addressed during session: Mobility/safety with mobility;Balance        AM-PAC PT 6 Clicks Mobility   Outcome Measure  Help needed turning from your back to your side while in a flat bed without using bedrails?: A Lot Help needed moving from lying on your back to sitting on the side of a flat bed without using bedrails?: Total Help needed moving to and from a bed to a chair (including a wheelchair)?: Total Help needed standing up from a chair using your arms (e.g., wheelchair or bedside chair)?: Total Help needed to walk in hospital room?: Total Help needed climbing 3-5 steps with a railing? : Total 6 Click Score: 7    End of Session Equipment Utilized During Treatment: Gait belt Activity Tolerance: Patient tolerated treatment well Patient left: with call bell/phone within reach;with family/visitor present;in chair;with chair alarm set Nurse Communication: Mobility status;Need for lift equipment (recommend maximove back to bed; lift pad ordered per secretary) PT Visit Diagnosis: Repeated falls (R29.6);Muscle weakness (generalized) (M62.81);Difficulty in walking, not elsewhere classified (R26.2)     Time: 9151-9082 PT Time Calculation (min) (ACUTE ONLY): 29 min  Charges:    $Gait Training: 8-22 mins PT  General Charges $$ ACUTE PT VISIT: 1 Visit                      April Warren, PT Acute Rehabilitation Services  Office [phone]    April Warren 01/11/2025, 9:40 AM

## 2025-01-11 NOTE — Progress Notes (Signed)
 " PROGRESS NOTE    April Warren  FMW:[id number] DOB: Jul 26, 1971 DOA: 01/04/2025 PCP: [name] LABOR, MD    Brief Narrative:   54 year old with history of HTN, DM2, CKD admitted for worsening fall and weakness.  Found to have spinal canal stenosis with cervical cord compression.  Underwent decompressive cervical laminectomy by neurosurgery.  PT now recommending CIR.  Assessment & Plan:  Cervical spinal stenosis causing left-sided weakness and fall - MRI confirmed severe cervical spinal stenosis with cord compression therefore underwent decompressive laminectomy by neurosurgery on 1/12.  Planning on CIR placement once cleared by neurosurgery.  Diabetes mellitus type 2 Hyperglycemia, steroid-induced - Will slowly resume home p.o. medications upon discharge.  For now with just long-acting and sliding scale  Essential hypertension - IV as needed  CKD 2 -Overall creatinine stable around 1.0   DVT prophylaxis: Place TED hose Start: 01/10/25 1151 SCD's Start: 01/09/25 1746      Code Status: Full Code Family Communication: Spouse at bedside Status is: Inpatient Remains inpatient appropriate because: CIR placement once cleared by neurosurgery   PT Follow up Recs: Acute Inpatient Rehab (3hours/Day)01/11/2025 9066  Subjective: Seen at bedside overall feels little better   Examination:  General exam: Appears calm and comfortable  Respiratory system: Clear to auscultation. Respiratory effort normal. Cardiovascular system: S1 & S2 heard, RRR. No JVD, murmurs, rubs, gallops or clicks. No pedal edema. Gastrointestinal system: Abdomen is nondistended, soft and nontender. No organomegaly or masses felt. Normal bowel sounds heard. Central nervous system: Alert and oriented. No focal neurological deficits. Extremities: Symmetric 5 x 5 power. Skin: No rashes, lesions or ulcers Psychiatry: Judgement and insight appear normal. Mood & affect appropriate.                Diet  Orders (From admission, onward)     Start     Ordered   01/09/25 1816  Diet Carb Modified Room service appropriate? Yes  Diet effective now       Question Answer Comment  Diet-HS Snack? Nothing   Calorie Level Medium 1600-2000   Fluid consistency: Thin   Room service appropriate? Yes      01/09/25 1815            Objective: Vitals:   01/10/25 2100 01/11/25 0516 01/11/25 0823 01/11/25 1203  BP:  (!) 138/57  (!) 152/73  Pulse:  (!) 51 (!) 55 69  Resp: (!) 9   18  Temp:  97.6 F (36.4 C) 97.6 F (36.4 C) 98.1 F (36.7 C)  TempSrc:  Oral Oral Oral  SpO2:  97% 98% 94%  Weight:      Height:        Intake/Output Summary (Last 24 hours) at 01/11/2025 1402 Last data filed at 01/11/2025 0513 Gross per 24 hour  Intake 704.8 ml  Output 2760 ml  Net -2055.2 ml   Filed Weights   01/04/25 1011 01/09/25 1057  Weight: 124 kg 124 kg    Scheduled Meds:  atorvastatin   80 mg Oral Daily   baclofen   5 mg Oral BID   Chlorhexidine  Gluconate Cloth  6 each Topical Daily   dexamethasone  (DECADRON ) injection  2 mg Intravenous Q6H   Or   dexamethasone   2 mg Oral Q6H   doxycycline   100 mg Oral BID   insulin  aspart  0-15 Units Subcutaneous TID WC   insulin  aspart  0-5 Units Subcutaneous QHS   insulin  aspart  7 Units Subcutaneous TID WC   insulin  glargine  20  Units Subcutaneous Q24H   senna  1 tablet Oral BID   sodium chloride  flush  3 mL Intravenous Q12H   Continuous Infusions:  Nutritional status     Body mass index is 40.37 kg/m.  Data Reviewed:   CBC: Recent Labs  Lab 01/07/25 0154 01/08/25 0206 01/09/25 0134 01/10/25 0209 01/11/25 0150  WBC 8.7 7.6 10.9* 12.0* 10.7*  NEUTROABS  --   --  8.8* 9.9* 8.6*  HGB 12.2 12.7 12.4 11.4* 11.6*  HCT 36.8 37.6 37.5 34.0* 34.8*  MCV 92.7 92.4 92.6 92.9 92.6  PLT 312 317 378 319 272   Basic Metabolic Panel: Recent Labs  Lab 01/06/25 0157 01/07/25 0154 01/08/25 0206 01/09/25 0134 01/10/25 0209 01/11/25 0150  NA 138  136 136 137 134* 134*  K 4.1 4.1 4.5 4.7 4.8 5.0  CL 106 102 102 102 101 101  CO2 23 23 22 22 22 23   GLUCOSE 145* 157* 226* 257* 279* 281*  BUN 29* 29* 32* 52* 49* 35*  CREATININE 1.07* 1.06* 0.98 1.49* 1.18* 0.93  CALCIUM  9.0 9.3 9.6 9.7 8.7* 8.7*  MG 1.6* 1.6*  --  1.8 1.7 1.8  PHOS 2.5 2.5  --  3.2 4.2 2.8   GFR: Estimated Creatinine Clearance: 97.5 mL/min (by C-G formula based on SCr of 0.93 mg/dL). Liver Function Tests: Recent Labs  Lab 01/09/25 0134 01/10/25 0209 01/11/25 0150  AST 26 21 18   ALT 23 18 13   ALKPHOS 78 66 74  BILITOT 0.5 0.4 0.5  PROT 7.0 6.0* 6.0*  ALBUMIN 3.9 3.5 3.4*   No results for input(s): LIPASE, AMYLASE in the last 168 hours. No results for input(s): AMMONIA in the last 168 hours. Coagulation Profile: No results for input(s): INR, PROTIME in the last 168 hours. Cardiac Enzymes: No results for input(s): CKTOTAL, CKMB, CKMBINDEX, TROPONINI in the last 168 hours. BNP (last 3 results) No results for input(s): PROBNP in the last 8760 hours. HbA1C: No results for input(s): HGBA1C in the last 72 hours. CBG: Recent Labs  Lab 01/10/25 2034 01/11/25 0017 01/11/25 0441 01/11/25 0638 01/11/25 1235  GLUCAP 316* 283* 259* 247* 279*   Lipid Profile: No results for input(s): CHOL, HDL, LDLCALC, TRIG, CHOLHDL, LDLDIRECT in the last 72 hours. Thyroid  Function Tests: No results for input(s): TSH, T4TOTAL, FREET4, T3FREE, THYROIDAB in the last 72 hours. Anemia Panel: No results for input(s): VITAMINB12, FOLATE, FERRITIN, TIBC, IRON, RETICCTPCT in the last 72 hours. Sepsis Labs: No results for input(s): PROCALCITON, LATICACIDVEN in the last 168 hours.  Recent Results (from the past 240 hours)  Resp panel by RT-PCR (RSV, Flu A&B, Covid) Anterior Nasal Swab     Status: None   Collection Time: 01/04/25 11:36 AM   Specimen: Anterior Nasal Swab  Result Value Ref Range Status   SARS Coronavirus 2  by RT PCR NEGATIVE NEGATIVE Final    Comment: (NOTE) SARS-CoV-2 target nucleic acids are NOT DETECTED.  The SARS-CoV-2 RNA is generally detectable in upper respiratory specimens during the acute phase of infection. The lowest concentration of SARS-CoV-2 viral copies this assay can detect is 138 copies/mL. A negative result does not preclude SARS-Cov-2 infection and should not be used as the sole basis for treatment or other patient management decisions. A negative result may occur with  improper specimen collection/handling, submission of specimen other than nasopharyngeal swab, presence of viral mutation(s) within the areas targeted by this assay, and inadequate number of viral copies(<138 copies/mL). A negative result must be combined with  clinical observations, patient history, and epidemiological information. The expected result is Negative.  Fact Sheet for Patients:  bloggercourse.com  Fact Sheet for Healthcare Providers:  seriousbroker.it  This test is no t yet approved or cleared by the [place]  FDA and  has been authorized for detection and/or diagnosis of SARS-CoV-2 by FDA under an Emergency Use Authorization (EUA). This EUA will remain  in effect (meaning this test can be used) for the duration of the COVID-19 declaration under Section 564(b)(1) of the Act, 21 U.S.C.section 360bbb-3(b)(1), unless the authorization is terminated  or revoked sooner.       Influenza A by PCR NEGATIVE NEGATIVE Final   Influenza B by PCR NEGATIVE NEGATIVE Final    Comment: (NOTE) The Xpert Xpress SARS-CoV-2/FLU/RSV plus assay is intended as an aid in the diagnosis of influenza from Nasopharyngeal swab specimens and should not be used as a sole basis for treatment. Nasal washings and aspirates are unacceptable for Xpert Xpress SARS-CoV-2/FLU/RSV testing.  Fact Sheet for Patients: bloggercourse.com  Fact  Sheet for Healthcare Providers: seriousbroker.it  This test is not yet approved or cleared by the [place]  FDA and has been authorized for detection and/or diagnosis of SARS-CoV-2 by FDA under an Emergency Use Authorization (EUA). This EUA will remain in effect (meaning this test can be used) for the duration of the COVID-19 declaration under Section 564(b)(1) of the Act, 21 U.S.C. section 360bbb-3(b)(1), unless the authorization is terminated or revoked.     Resp Syncytial Virus by PCR NEGATIVE NEGATIVE Final    Comment: (NOTE) Fact Sheet for Patients: bloggercourse.com  Fact Sheet for Healthcare Providers: seriousbroker.it  This test is not yet approved or cleared by the [place]  FDA and has been authorized for detection and/or diagnosis of SARS-CoV-2 by FDA under an Emergency Use Authorization (EUA). This EUA will remain in effect (meaning this test can be used) for the duration of the COVID-19 declaration under Section 564(b)(1) of the Act, 21 U.S.C. section 360bbb-3(b)(1), unless the authorization is terminated or revoked.  Performed at [facility], [address] [place], [place] [address]   Surgical pcr screen     Status: None   Collection Time: 01/07/25  5:17 PM   Specimen: Nasal Mucosa; Nasal Swab  Result Value Ref Range Status   MRSA, PCR NEGATIVE NEGATIVE Final   Staphylococcus aureus NEGATIVE NEGATIVE Final    Comment: (NOTE) The Xpert SA Assay (FDA approved for NASAL specimens in patients 41 years of age and older), is one component of a comprehensive surveillance program. It is not intended to diagnose infection nor to guide or monitor treatment. Performed at [facility], [address] [place], [place] [address]          Radiology Studies: DG Cervical Spine 2 or 3 views Result Date: 01/10/2025 EXAM: FLUOROSCOPIC IMAGING TECHNIQUE: Fluoroscopy was provided by  the radiology department for procedure. Radiologist was not present during examination. RADIATION DOSE INDEX: Fluoro time: 23.5 seconds. Reference Air Kerma: 5.22 mGy. COMPARISON: MRI cervical spine dated 04/15/2023. CT cervical spine dated 01/02/2025. CLINICAL HISTORY: [id number] Surgery, elective [id number]. FINDINGS: Intraoperative fluoroscopic imaging was performed. Intraoperative placement of posterior cervical fusion and foraminotomy at the C2 to C7 levels. A total of 4 intraoperative low-resolution images of the cervical spine were obtained. IMPRESSION: 1. Intraoperative fluoroscopic imaging as above. Please refer to the operative report for full details. Electronically signed by: Morgane Naveau MD MD 01/10/2025 12:46 AM EST RP Workstation: [id number]   DG C-Arm 1-60 Min-No  Report Result Date: 01/09/2025 Fluoroscopy was utilized by the requesting physician.  No radiographic interpretation.   DG C-Arm 1-60 Min-No Report Result Date: 01/09/2025 Fluoroscopy was utilized by the requesting physician.  No radiographic interpretation.   DG C-Arm 1-60 Min-No Report Result Date: 01/09/2025 Fluoroscopy was utilized by the requesting physician.  No radiographic interpretation.           LOS: 6 days   Time spent= 35 mins    Burgess JAYSON Dare, MD [facility]  If 7PM-7AM, please contact night-coverage  01/11/2025, 2:02 PM  "

## 2025-01-11 NOTE — Hospital Course (Addendum)
 Brief Narrative:   54 year old with history of HTN, DM2, CKD admitted for worsening fall and weakness.  Found to have spinal canal stenosis with cervical cord compression.  Underwent decompressive cervical laminectomy by neurosurgery.  PT now recommending CIR.  Assessment & Plan:  Cervical spinal stenosis causing left-sided weakness and fall - MRI confirmed severe cervical spinal stenosis with cord compression therefore underwent decompressive laminectomy by neurosurgery on 1/12.  Planning on CIR placement once cleared by neurosurgery.  Will prescribe her prednisone taper for now.  Neurosurgery can extended during follow-up  Diabetes mellitus type 2 Hyperglycemia, steroid-induced - On p.o. medications.  Continue sliding scale  Essential hypertension - IV as needed  CKD 2 -Overall creatinine stable around 1.0   DVT prophylaxis: Place TED hose Start: 01/10/25 1151 SCD's Start: 01/09/25 1746      Code Status: Full Code Family Communication: Spouse at bedside Status is: Inpatient Remains inpatient appropriate because: CIR placement PT Follow up Recs: Acute Inpatient Rehab (3hours/Day)01/11/2025 9066  Subjective: Seen at bedside overall feels little better No new complaints today   Examination:  General exam: Appears calm and comfortable  Respiratory system: Clear to auscultation. Respiratory effort normal. Cardiovascular system: S1 & S2 heard, RRR. No JVD, murmurs, rubs, gallops or clicks. No pedal edema. Gastrointestinal system: Abdomen is nondistended, soft and nontender. No organomegaly or masses felt. Normal bowel sounds heard. Central nervous system: Alert and oriented. No focal neurological deficits. Extremities: Symmetric 5 x 5 power. Skin: No rashes, lesions or ulcers Psychiatry: Judgement and insight appear normal. Mood & affect appropriate.

## 2025-01-11 NOTE — Progress Notes (Signed)
" ° °  Inpatient Rehabilitation Admissions Coordinator   I met with patient and spouse at bedside to review estimated cost of care if CIR approved. I await insurance determination to admit.  Heron Leavell, RN, MSN Rehab Admissions Coordinator [phone] 01/11/2025 12:07 PM  "

## 2025-01-11 NOTE — Plan of Care (Signed)
   Problem: Coping: Goal: Level of anxiety will decrease Outcome: Progressing

## 2025-01-12 ENCOUNTER — Inpatient Hospital Stay (HOSPITAL_COMMUNITY)
Admission: AD | Admit: 2025-01-12 | DRG: 945 | Source: Intra-hospital | Attending: Physical Medicine and Rehabilitation | Admitting: Physical Medicine and Rehabilitation

## 2025-01-12 ENCOUNTER — Other Ambulatory Visit: Payer: Self-pay

## 2025-01-12 ENCOUNTER — Encounter (HOSPITAL_COMMUNITY): Payer: Self-pay | Admitting: Physical Medicine and Rehabilitation

## 2025-01-12 DIAGNOSIS — R531 Weakness: Secondary | ICD-10-CM

## 2025-01-12 DIAGNOSIS — G8250 Quadriplegia, unspecified: Secondary | ICD-10-CM | POA: Diagnosis not present

## 2025-01-12 DIAGNOSIS — S14109A Unspecified injury at unspecified level of cervical spinal cord, initial encounter: Secondary | ICD-10-CM | POA: Diagnosis present

## 2025-01-12 DIAGNOSIS — E669 Obesity, unspecified: Secondary | ICD-10-CM | POA: Diagnosis present

## 2025-01-12 DIAGNOSIS — R911 Solitary pulmonary nodule: Secondary | ICD-10-CM | POA: Diagnosis present

## 2025-01-12 DIAGNOSIS — T7840XA Allergy, unspecified, initial encounter: Secondary | ICD-10-CM | POA: Diagnosis present

## 2025-01-12 DIAGNOSIS — J40 Bronchitis, not specified as acute or chronic: Secondary | ICD-10-CM

## 2025-01-12 DIAGNOSIS — N183 Chronic kidney disease, stage 3 unspecified: Secondary | ICD-10-CM | POA: Diagnosis present

## 2025-01-12 DIAGNOSIS — E119 Type 2 diabetes mellitus without complications: Secondary | ICD-10-CM

## 2025-01-12 DIAGNOSIS — E559 Vitamin D deficiency, unspecified: Secondary | ICD-10-CM | POA: Diagnosis present

## 2025-01-12 DIAGNOSIS — F4321 Adjustment disorder with depressed mood: Secondary | ICD-10-CM | POA: Diagnosis present

## 2025-01-12 DIAGNOSIS — F419 Anxiety disorder, unspecified: Secondary | ICD-10-CM | POA: Diagnosis present

## 2025-01-12 DIAGNOSIS — E782 Mixed hyperlipidemia: Secondary | ICD-10-CM | POA: Diagnosis present

## 2025-01-12 DIAGNOSIS — N319 Neuromuscular dysfunction of bladder, unspecified: Secondary | ICD-10-CM | POA: Diagnosis present

## 2025-01-12 DIAGNOSIS — K5900 Constipation, unspecified: Secondary | ICD-10-CM | POA: Diagnosis present

## 2025-01-12 DIAGNOSIS — I10 Essential (primary) hypertension: Secondary | ICD-10-CM | POA: Diagnosis present

## 2025-01-12 DIAGNOSIS — E876 Hypokalemia: Secondary | ICD-10-CM | POA: Diagnosis present

## 2025-01-12 DIAGNOSIS — J209 Acute bronchitis, unspecified: Secondary | ICD-10-CM | POA: Diagnosis present

## 2025-01-12 DIAGNOSIS — Z981 Arthrodesis status: Secondary | ICD-10-CM

## 2025-01-12 DIAGNOSIS — G904 Autonomic dysreflexia: Secondary | ICD-10-CM | POA: Diagnosis present

## 2025-01-12 LAB — GLUCOSE, CAPILLARY
Glucose-Capillary: 195 mg/dL — ABNORMAL HIGH (ref 70–99)
Glucose-Capillary: 279 mg/dL — ABNORMAL HIGH (ref 70–99)
Glucose-Capillary: 284 mg/dL — ABNORMAL HIGH (ref 70–99)
Glucose-Capillary: 227 mg/dL — ABNORMAL HIGH (ref 70–99)

## 2025-01-12 MED ORDER — POLYVINYL ALCOHOL 1.4 % OP SOLN: 1.0000 [drp] | Freq: Three times a day (TID) | OPHTHALMIC | Status: AC | PRN

## 2025-01-12 MED ORDER — FLEET ENEMA RE ENEM: 1.0000 | ENEMA | Freq: Once | RECTAL | Status: AC | PRN

## 2025-01-12 MED ORDER — INSULIN ASPART 100 UNIT/ML IJ SOLN
0.0000 [IU] | Freq: Every day | INTRAMUSCULAR | Status: DC
  Administered 2025-01-12 – 2025-01-13 (×2): 2 [IU] via SUBCUTANEOUS
  Administered 2025-01-14: 4 [IU] via SUBCUTANEOUS
  Administered 2025-01-15 – 2025-01-17 (×3): 2 [IU] via SUBCUTANEOUS
  Administered 2025-01-18: 3 [IU] via SUBCUTANEOUS
  Administered 2025-01-19 – 2025-01-30 (×2): 2 [IU] via SUBCUTANEOUS
  Administered 2025-01-31: 3 [IU] via SUBCUTANEOUS
  Administered 2025-02-01: 2 [IU] via SUBCUTANEOUS
  Administered 2025-02-02: 3 [IU] via SUBCUTANEOUS
  Filled 2025-01-12: qty 3
  Filled 2025-01-12: qty 2
  Filled 2025-01-12: qty 4
  Filled 2025-01-12: qty 3
  Filled 2025-01-12 (×3): qty 2
  Filled 2025-01-12: qty 3
  Filled 2025-01-12 (×4): qty 2

## 2025-01-12 MED ORDER — CHLORHEXIDINE GLUCONATE CLOTH 2 % EX PADS
6.0000 | MEDICATED_PAD | Freq: Every day | CUTANEOUS | Status: DC
  Administered 2025-01-13 – 2025-01-14 (×2): 6 via TOPICAL

## 2025-01-12 MED ORDER — SORBITOL 70 % SOLN
45.0000 mL | Freq: Once | Status: AC
  Administered 2025-01-12: 45 mL via ORAL
  Filled 2025-01-12: qty 60

## 2025-01-12 MED ORDER — LORAZEPAM 0.5 MG PO TABS
0.5000 mg | ORAL_TABLET | Freq: Two times a day (BID) | ORAL | Status: AC | PRN
  Administered 2025-01-29: 0.5 mg via ORAL
  Filled 2025-01-12: qty 1

## 2025-01-12 MED ORDER — ALUM & MAG HYDROXIDE-SIMETH 200-200-20 MG/5ML PO SUSP: 30.0000 mL | ORAL | Status: DC | PRN

## 2025-01-12 MED ORDER — DEXAMETHASONE 1 MG PO TABS: ORAL_TABLET | ORAL | Status: AC

## 2025-01-12 MED ORDER — VITAMIN C 500 MG PO TABS
1000.0000 mg | ORAL_TABLET | Freq: Every day | ORAL | Status: AC
  Administered 2025-01-12 – 2025-02-03 (×23): 1000 mg via ORAL
  Filled 2025-01-12 (×23): qty 2

## 2025-01-12 MED ORDER — DEXAMETHASONE 2 MG PO TABS
1.0000 mg | ORAL_TABLET | Freq: Four times a day (QID) | ORAL | Status: DC
  Administered 2025-01-12: 1 mg via ORAL
  Filled 2025-01-12: qty 1

## 2025-01-12 MED ORDER — ZINC SULFATE 220 (50 ZN) MG PO CAPS
220.0000 mg | ORAL_CAPSULE | Freq: Every day | ORAL | Status: DC
  Administered 2025-01-15 – 2025-01-16 (×2): 220 mg via ORAL
  Filled 2025-01-12 (×9): qty 1

## 2025-01-12 MED ORDER — OXYCODONE HCL 5 MG PO TABS
10.0000 mg | ORAL_TABLET | ORAL | Status: DC | PRN
  Administered 2025-01-12 – 2025-01-13 (×3): 10 mg via ORAL
  Filled 2025-01-12 (×3): qty 2

## 2025-01-12 MED ORDER — PROSOURCE PLUS PO LIQD
30.0000 mL | Freq: Two times a day (BID) | ORAL | Status: DC
  Administered 2025-01-12 – 2025-01-27 (×9): 30 mL via ORAL
  Filled 2025-01-12 (×14): qty 30

## 2025-01-12 MED ORDER — POLYETHYLENE GLYCOL 3350 17 G PO PACK: 17.0000 g | PACK | Freq: Every day | ORAL | Status: AC | PRN

## 2025-01-12 MED ORDER — METHOCARBAMOL 500 MG PO TABS
500.0000 mg | ORAL_TABLET | Freq: Four times a day (QID) | ORAL | Status: AC | PRN
  Administered 2025-01-12 – 2025-02-03 (×9): 500 mg via ORAL
  Filled 2025-01-12 (×11): qty 1

## 2025-01-12 MED ORDER — SENNA 8.6 MG PO TABS
1.0000 | ORAL_TABLET | Freq: Two times a day (BID) | ORAL | Status: DC
  Administered 2025-01-12 – 2025-01-13 (×2): 8.6 mg via ORAL
  Filled 2025-01-12 (×2): qty 1

## 2025-01-12 MED ORDER — MENTHOL 3 MG MT LOZG: 1.0000 | LOZENGE | OROMUCOSAL | Status: AC | PRN

## 2025-01-12 MED ORDER — DEXAMETHASONE 0.5 MG PO TABS
1.0000 mg | ORAL_TABLET | Freq: Four times a day (QID) | ORAL | Status: DC
  Administered 2025-01-12 – 2025-01-13 (×3): 1 mg via ORAL
  Filled 2025-01-12 (×3): qty 2

## 2025-01-12 MED ORDER — DEXAMETHASONE SODIUM PHOSPHATE 4 MG/ML IJ SOLN
1.0000 mg | Freq: Four times a day (QID) | INTRAMUSCULAR | Status: DC
  Filled 2025-01-12 (×2): qty 1

## 2025-01-12 MED ORDER — INSULIN ASPART 100 UNIT/ML IJ SOLN
0.0000 [IU] | Freq: Three times a day (TID) | INTRAMUSCULAR | Status: DC
  Administered 2025-01-12: 8 [IU] via SUBCUTANEOUS
  Administered 2025-01-13: 11 [IU] via SUBCUTANEOUS
  Administered 2025-01-13: 3 [IU] via SUBCUTANEOUS
  Administered 2025-01-13: 8 [IU] via SUBCUTANEOUS
  Administered 2025-01-14: 3 [IU] via SUBCUTANEOUS
  Administered 2025-01-14: 8 [IU] via SUBCUTANEOUS
  Administered 2025-01-14: 15 [IU] via SUBCUTANEOUS
  Administered 2025-01-15: 5 [IU] via SUBCUTANEOUS
  Administered 2025-01-15: 3 [IU] via SUBCUTANEOUS
  Administered 2025-01-15: 5 [IU] via SUBCUTANEOUS
  Administered 2025-01-16 – 2025-01-17 (×3): 3 [IU] via SUBCUTANEOUS
  Administered 2025-01-17: 5 [IU] via SUBCUTANEOUS
  Administered 2025-01-17: 3 [IU] via SUBCUTANEOUS
  Administered 2025-01-18: 5 [IU] via SUBCUTANEOUS
  Administered 2025-01-18: 2 [IU] via SUBCUTANEOUS
  Administered 2025-01-18: 5 [IU] via SUBCUTANEOUS
  Administered 2025-01-19 – 2025-01-20 (×5): 3 [IU] via SUBCUTANEOUS
  Administered 2025-01-21: 2 [IU] via SUBCUTANEOUS
  Administered 2025-01-21: 5 [IU] via SUBCUTANEOUS
  Administered 2025-01-22: 2 [IU] via SUBCUTANEOUS
  Administered 2025-01-22: 3 [IU] via SUBCUTANEOUS
  Administered 2025-01-23: 15 [IU] via SUBCUTANEOUS
  Administered 2025-01-24 (×3): 3 [IU] via SUBCUTANEOUS
  Administered 2025-01-25: 8 [IU] via SUBCUTANEOUS
  Administered 2025-01-25 – 2025-01-26 (×2): 3 [IU] via SUBCUTANEOUS
  Administered 2025-01-26: 5 [IU] via SUBCUTANEOUS
  Administered 2025-01-27 (×2): 3 [IU] via SUBCUTANEOUS
  Administered 2025-01-28 – 2025-01-30 (×3): 2 [IU] via SUBCUTANEOUS
  Administered 2025-01-31: 5 [IU] via SUBCUTANEOUS
  Administered 2025-01-31: 8 [IU] via SUBCUTANEOUS
  Administered 2025-01-31: 11 [IU] via SUBCUTANEOUS
  Administered 2025-02-01: 8 [IU] via SUBCUTANEOUS
  Administered 2025-02-01 (×2): 5 [IU] via SUBCUTANEOUS
  Administered 2025-02-02: 3 [IU] via SUBCUTANEOUS
  Administered 2025-02-02 (×2): 5 [IU] via SUBCUTANEOUS
  Administered 2025-02-03: 3 [IU] via SUBCUTANEOUS
  Administered 2025-02-03: 8 [IU] via SUBCUTANEOUS
  Administered 2025-02-03: 5 [IU] via SUBCUTANEOUS
  Filled 2025-01-12: qty 5
  Filled 2025-01-12 (×3): qty 3
  Filled 2025-01-12: qty 8
  Filled 2025-01-12: qty 11
  Filled 2025-01-12: qty 15
  Filled 2025-01-12: qty 4
  Filled 2025-01-12: qty 8
  Filled 2025-01-12: qty 5
  Filled 2025-01-12: qty 2
  Filled 2025-01-12 (×3): qty 3
  Filled 2025-01-12 (×3): qty 5
  Filled 2025-01-12: qty 3
  Filled 2025-01-12: qty 5
  Filled 2025-01-12: qty 3
  Filled 2025-01-12: qty 2
  Filled 2025-01-12: qty 3
  Filled 2025-01-12: qty 5
  Filled 2025-01-12 (×2): qty 3
  Filled 2025-01-12: qty 2
  Filled 2025-01-12: qty 3
  Filled 2025-01-12: qty 5
  Filled 2025-01-12 (×4): qty 3
  Filled 2025-01-12 (×2): qty 5
  Filled 2025-01-12: qty 8
  Filled 2025-01-12 (×2): qty 3
  Filled 2025-01-12: qty 5
  Filled 2025-01-12: qty 2
  Filled 2025-01-12: qty 8
  Filled 2025-01-12: qty 11
  Filled 2025-01-12: qty 9
  Filled 2025-01-12 (×2): qty 8
  Filled 2025-01-12: qty 5
  Filled 2025-01-12: qty 2
  Filled 2025-01-12: qty 8
  Filled 2025-01-12: qty 15
  Filled 2025-01-12: qty 5

## 2025-01-12 MED ORDER — PHENOL 1.4 % MT LIQD: 1.0000 | OROMUCOSAL | Status: AC | PRN

## 2025-01-12 MED ORDER — BACLOFEN 5 MG HALF TABLET
5.0000 mg | ORAL_TABLET | Freq: Two times a day (BID) | ORAL | Status: AC
  Administered 2025-01-12 – 2025-02-03 (×45): 5 mg via ORAL
  Filled 2025-01-12 (×45): qty 1

## 2025-01-12 MED ORDER — GUAIFENESIN-DM 100-10 MG/5ML PO SYRP: 5.0000 mL | ORAL_SOLUTION | Freq: Four times a day (QID) | ORAL | Status: AC | PRN

## 2025-01-12 MED ORDER — INSULIN GLARGINE 100 UNIT/ML ~~LOC~~ SOLN
30.0000 [IU] | Freq: Every day | SUBCUTANEOUS | Status: DC
  Administered 2025-01-12: 30 [IU] via SUBCUTANEOUS
  Filled 2025-01-12: qty 0.3

## 2025-01-12 MED ORDER — BISACODYL 10 MG RE SUPP
10.0000 mg | Freq: Every day | RECTAL | Status: DC
  Administered 2025-01-12: 10 mg via RECTAL
  Filled 2025-01-12: qty 1

## 2025-01-12 MED ORDER — TRAZODONE HCL 50 MG PO TABS
25.0000 mg | ORAL_TABLET | Freq: Every evening | ORAL | Status: AC | PRN
  Administered 2025-02-02: 50 mg via ORAL
  Filled 2025-01-12: qty 1

## 2025-01-12 MED ORDER — ORAL CARE MOUTH RINSE: 15.0000 mL | OROMUCOSAL | Status: AC | PRN

## 2025-01-12 MED ORDER — SORBITOL 70 % SOLN: 30.0000 mL | Freq: Every day | Status: AC | PRN

## 2025-01-12 MED ORDER — INSULIN ASPART 100 UNIT/ML IJ SOLN
7.0000 [IU] | Freq: Three times a day (TID) | INTRAMUSCULAR | Status: AC
  Administered 2025-01-12 – 2025-02-03 (×55): 7 [IU] via SUBCUTANEOUS
  Filled 2025-01-12 (×57): qty 7

## 2025-01-12 MED ORDER — METHOCARBAMOL 1000 MG/10ML IJ SOLN: 500.0000 mg | Freq: Four times a day (QID) | INTRAMUSCULAR | Status: AC | PRN

## 2025-01-12 MED ORDER — DEXAMETHASONE SODIUM PHOSPHATE 4 MG/ML IJ SOLN: 1.0000 mg | Freq: Four times a day (QID) | INTRAMUSCULAR | Status: DC

## 2025-01-12 MED ORDER — DEXAMETHASONE 2 MG PO TABS: 1.0000 mg | ORAL_TABLET | Freq: Four times a day (QID) | ORAL | Status: DC

## 2025-01-12 MED ORDER — DEXAMETHASONE SODIUM PHOSPHATE 4 MG/ML IJ SOLN: 2.0000 mg | Freq: Four times a day (QID) | INTRAMUSCULAR | Status: DC

## 2025-01-12 MED ORDER — DOXYCYCLINE HYCLATE 100 MG PO TABS
100.0000 mg | ORAL_TABLET | Freq: Two times a day (BID) | ORAL | Status: AC
  Administered 2025-01-12: 100 mg via ORAL
  Filled 2025-01-12 (×3): qty 1

## 2025-01-12 MED ORDER — INSULIN GLARGINE 100 UNIT/ML ~~LOC~~ SOLN
30.0000 [IU] | Freq: Every day | SUBCUTANEOUS | Status: DC
  Administered 2025-01-13 – 2025-01-14 (×2): 30 [IU] via SUBCUTANEOUS
  Filled 2025-01-12 (×2): qty 0.3

## 2025-01-12 MED ORDER — ATORVASTATIN CALCIUM 80 MG PO TABS
80.0000 mg | ORAL_TABLET | Freq: Every day | ORAL | Status: AC
  Administered 2025-01-13 – 2025-02-03 (×22): 80 mg via ORAL
  Filled 2025-01-12 (×22): qty 1

## 2025-01-12 NOTE — H&P (Signed)
 "   Physical Medicine and Rehabilitation Admission H&P    Chief Complaint  Patient presents with   Functional deficits due to spinal cord injury    HPI: April Warren is a 54 year old right handed female with PMHx T2DM, diabetic retinopathy, HTN, HLD, CKD 3a, obesity and 3 previous falls presented to [facility] on 01/02/2025 with complaints of falls and worsening weakness over the past 2 days. Per chart review she had been seen by her PCP where she was treated for bronchitis and sinusitis with course of doxycyline. She presented to the ER later that evening after slipping backwards going up a ramp hitting her head, neck, left shoulder. She denied LOC or focal weakness at that time. Taking aspirin, nose bleed controlled and vitals were stable with EMS. Of note patient reported ongoing left lower extremity weakness since September 2025 and onset of left upper extremities since onset. Denies bowel or bladder problems.   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 Neurosurgery consulted recommended MRI C-spine and transfer to [facility]. 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 Moderate to severe spinal canal stenosis with cord compression at C5-6 with possible cord signal abnormality favoring myelomalacia. Moderate spinal canal stenosis at C2-3 without cord signal abnormality. 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 Due to AKI and CKD with cr of 1.6 and acute bronchitis, surgery was delayed for 1 week. The patient underwent C2-C7 posterior laminectomy with lateral mass fusion an fixation by Dr. Joshua on 1/12. Hospital course complicated by post operative urinary retention requiring foley catheter, now removed. Negative Covid, flu and RSV. Hgb A1c 5.1 9/25. SSI  for now and CBGS monitoring. Holding oral home meds. Holding home HTN meds of metoprolol , lisinopril , triamterene -hydrochlorothiazide . DVT prophylaxis TED hose until able to resume Lovenox .   The patient was independent and driving without use of DME. She lives in a one level home with ramped entrance with her spouse and 5 underage children. Working with PT/OT requires Min assist +2 physical assistance for ambulation of 6 ft. Transfers with min to moderate assist and completes ADLs at bed level with mod to max assist. Therapy evaluations completed due to patient decreased functional mobility was admitted for a comprehensive rehab program.  Pt reports LBM 1/10. Feeling very constipated Has been voiding but frequently  Pain really bad this AM- and with PT/PT- got up to easily 6-7/10- 5/10 with meds- they make her sleepy, so doesn't want to increase dose currently.   Spasms also better with Baclofen  5 mg BID - only a few spasms today.    Review of Systems  Constitutional:  Positive for malaise/fatigue.  HENT: Negative.  Negative for hearing loss.   Eyes: Negative.  Negative for blurred vision and double vision.  Respiratory:  Negative for cough, shortness of breath and wheezing.   Cardiovascular:  Negative for chest pain and leg swelling.  Gastrointestinal:  Positive for constipation. Negative for diarrhea, nausea and vomiting.  Genitourinary:  Positive for frequency.       Foley removed and is now voiding  Musculoskeletal:  Positive for falls, joint pain, myalgias and neck pain.  Skin: Negative.   Neurological:  Positive for tingling, sensory change, focal weakness and weakness. Negative for dizziness and headaches.  Endo/Heme/Allergies: Negative.   Psychiatric/Behavioral:  Positive for depression. The  patient is not nervous/anxious.   All other systems reviewed and are negative.       Past Medical History:  Diagnosis Date   Allergy    Asthma    Certain time of the year    Chicken pox 54 yrs old   Diabetes mellitus 08/12/2012   type 2- dr alvia diagnosed   Diabetic retinopathy (HCC)    Dysmenorrhea 08/20/2012   HTN (hypertension) 08/20/2012   Hyperlipidemia, mixed 08/28/2013   Neuromuscular disorder (HCC)    Not sure of date   Neuropathy 02/16/2017   Obesity    Obesity, unspecified 08/28/2013   Other and unspecified hyperlipidemia 08/28/2013   Preventative health care 08/20/2012   Sinusitis 11/14/2013   Sinusitis, acute 05/02/2016   Past Surgical History:  Procedure Laterality Date   BREAST BIOPSY Right 01/29/2024   US  RT BREAST BX W LOC DEV 1ST LESION IMG BX SPEC US  GUIDE 01/29/2024 [facility] MAMMOGRAPHY   EYE SURGERY     Laser surgery for bleeds   REFRACTIVE SURGERY  08/12/2012   right, left on 09/16/12   Family History  Problem Relation Age of Onset   Cancer Mother 76       breast   Diabetes Mother        type 2   Hypertension Mother    Hypertension Father    Hyperlipidemia Father    COPD Father    Arthritis Father    Asthma Father    Hypertension Maternal Grandmother    Hyperlipidemia Maternal Grandmother    COPD Maternal Grandmother    Asthma Maternal Grandmother    Hypertension Maternal Grandfather    Heart attack Maternal Grandfather    Stroke Maternal Grandfather    Gout Brother    Cancer Brother        lung   Heart disease Paternal Grandfather    Alcohol  abuse Paternal Grandfather    Social History:  reports that she has never smoked. She has never used smokeless tobacco. She reports current alcohol  use. She reports that she does not use drugs. Allergies: Allergies[1] Medications Prior to Admission  Medication Sig Dispense Refill   albuterol  (PROAIR  HFA) 108 (90 Base) MCG/ACT inhaler USE 2 INHALATIONS EVERY 6 HOURS AS NEEDED FOR WHEEZING OR SHORTNESS OF BREATH (Patient taking differently: 2 puffs every 6 (six) hours as needed for wheezing or shortness of breath.) 18 g 5   aspirin EC 81 MG tablet Take 81 mg by mouth daily.  Swallow whole.     atorvastatin  (LIPITOR ) 80 MG tablet TAKE 1 TABLET DAILY 90 tablet 3   cetirizine  (ZYRTEC ) 10 MG tablet Take 1 tablet (10 mg total) by mouth daily as needed for allergies or rhinitis. 90 tablet 1   doxycycline  (VIBRAMYCIN ) 100 MG capsule Take 1 capsule (100 mg total) by mouth 2 (two) times daily. 20 capsule 0   ezetimibe  (ZETIA ) 10 MG tablet Take 1 tablet (10 mg total) by mouth daily. 90 tablet 3   fenofibrate  micronized (LOFIBRA) 134 MG capsule Take 1 capsule (134 mg total) by mouth daily before breakfast. 90 capsule 1   gabapentin  (NEURONTIN ) 300 MG capsule TAKE 1 CAPSULE TWICE A DAY AND 2 CAPSULES AT BEDTIME 360 capsule 3   glipiZIDE  (GLUCOTROL ) 10 MG tablet Take 0.5 tablets (5 mg total) by mouth 2 (two) times daily before a meal. (Patient taking differently: Take 10 mg by mouth daily before breakfast.) 180 tablet 1   ibuprofen (ADVIL,MOTRIN) 200 MG tablet Take 400 mg by mouth every 8 (  eight) hours as needed for mild pain (pain score 1-3).     JANUVIA  100 MG tablet TAKE 1 TABLET DAILY 90 tablet 3   lisinopril  (ZESTRIL ) 20 MG tablet Take 1 tablet (20 mg total) by mouth 2 (two) times daily. 180 tablet 1   LORazepam  (ATIVAN ) 1 MG tablet TAKE 1/2 TABLET BY MOUTH TWICE DAILY AS NEEDED FOR ANXIETY (Patient taking differently: Take 0.5 mg by mouth at bedtime.) 90 tablet 1   Magnesium Oxide 400 MG CAPS Take 1 capsule by mouth daily. Pt is taking 825mg  capsule every other day. (Patient taking differently: Take 1 capsule by mouth daily.)     metFORMIN  (GLUCOPHAGE -XR) 750 MG 24 hr tablet Take 1 tablet (750 mg total) by mouth daily with breakfast.     metoprolol  tartrate (LOPRESSOR ) 50 MG tablet TAKE 1 TABLET TWICE A DAY 180 tablet 3   montelukast  (SINGULAIR ) 10 MG tablet TAKE 1 TABLET AT BEDTIME 90 tablet 3   Potassium Bicarbonate 99 MG CAPS Take 2 capsules by mouth daily.     triamterene -hydrochlorothiazide  (MAXZIDE-25) 37.5-25 MG tablet TAKE 1 TABLET DAILY 90 tablet 3   Vitamin D ,  Ergocalciferol , (DRISDOL ) 1.25 MG (50000 UNIT) CAPS capsule TAKE 1 CAPSULE EVERY 7 DAYS 12 capsule 3   Blood Glucose Monitoring Suppl (FREESTYLE FREEDOM LITE) w/Device KIT Check blood sugar twice daily 1 each 0   glucose blood test strip Use as instructed 100 each 12   Lancets (FREESTYLE) lancets Check blood sugar twice daily 200 each 5   meloxicam  (MOBIC ) 7.5 MG tablet Take 1-2 tablets (7.5-15 mg total) by mouth daily. (Patient not taking: Reported on 01/04/2025) 40 tablet 1      Home: Home Living Family/patient expects to be discharged to:: Private residence Living Arrangements: Spouse/significant other, Children (5 children all under the age of 14 yo) Available Help at Discharge: Family, Available 24 hours/day Type of Home: House Home Access: Ramped entrance Home Layout: One level Bathroom Shower/Tub: Tub/shower unit, Engineer, Building Services: Handicapped height Bathroom Accessibility: Yes Home Equipment: Cane - single point, Wheelchair - manual Additional Comments: they have 5 children in the home 3yo- 9 yo adopted.  Lives With: Spouse, Family   Functional History: Prior Function Prior Level of Function : Independent/Modified Independent, History of Falls (last six months) Mobility Comments: prior to christmas; 2 falls (one slip in shower, one step off curb); then getting out of car fell backwards  Functional Status:  Mobility: Bed Mobility Overal bed mobility: Needs Assistance Bed Mobility: Sit to Sidelying Rolling: Mod assist Sidelying to sit: +2 for physical assistance, HOB elevated, Max assist Sit to sidelying: Min assist General bed mobility comments: assist to raise legs onto bed Transfers Overall transfer level: Needs assistance Equipment used: Rolling walker (2 wheels) Transfers: Sit to/from Stand Sit to Stand: Min assist, +2 physical assistance, Mod assist Bed to/from chair/wheelchair/BSC transfer type:: Step pivot Step pivot transfers: Min assist, +2 physical  assistance General transfer comment: cues for hand placement each time;  Sit to stand  from recliner to RW and to sink varied min to mod assist Ambulation/Gait Ambulation/Gait assistance: Min assist, +2 physical assistance, +2 safety/equipment Gait Distance (Feet): 6 Feet (seated rest, 6 ft) Assistive device: Rolling walker (2 wheels) Gait Pattern/deviations: Step-to pattern, Decreased stride length, Decreased dorsiflexion - right, Decreased dorsiflexion - left, Knee hyperextension - right, Knee hyperextension - left, Shuffle, Narrow base of support General Gait Details: steadying assist with initial chair follow; second walk no chair follow needed Gait velocity interpretation: <1.31  ft/sec, indicative of household ambulator Pre-gait activities: lateral wt-shifting x 5 each direction with bil UE support    ADL: ADL Overall ADL's : Needs assistance/impaired Eating/Feeding: Set up, With adaptive utensils Eating/Feeding Details (indicate cue type and reason): patient states she is able to feed self with built up utensils Grooming: Minimal assistance, Bed level Upper Body Bathing: Moderate assistance, Bed level Lower Body Bathing: Total assistance Upper Body Dressing : Moderate assistance Lower Body Dressing: Maximal assistance, Bed level Lower Body Dressing Details (indicate cue type and reason): socks General ADL Comments: pt able to complete self feeding of a muffin this sesison with red foam fork  Cognition: Cognition Orientation Level: Oriented X4 Cognition Arousal: Alert Behavior During Therapy: WFL for tasks assessed/performed  Physical Exam: Blood pressure (!) 150/71, pulse 68, temperature 97.6 F (36.4 C), temperature source Oral, resp. rate 16, height 5' 9 (1.753 m), weight 124 kg, last menstrual period 10/28/2014, SpO2 98%. Physical Exam Vitals and nursing note reviewed.  Constitutional:      Appearance: She is obese.     Comments: BMI 37.5; awake, alert, appropriate,  sitting up in bed; NAD  HENT:     Head: Normocephalic and atraumatic.     Comments: Face red    Right Ear: External ear normal.     Left Ear: External ear normal.     Nose: Nose normal. No congestion.     Mouth/Throat:     Mouth: Mucous membranes are moist.     Pharynx: No oropharyngeal exudate.  Eyes:     General:        Right eye: No discharge.        Left eye: No discharge.     Extraocular Movements: Extraocular movements intact.  Neck:     Comments: Posterior incision covered with original surgical dressing Cardiovascular:     Rate and Rhythm: Normal rate and regular rhythm.     Heart sounds: Normal heart sounds. No murmur heard.    No gallop.  Pulmonary:     Effort: Pulmonary effort is normal. No respiratory distress.     Breath sounds: Normal breath sounds. No wheezing, rhonchi or rales.  Abdominal:     General: There is distension.     Palpations: Abdomen is soft.     Tenderness: There is no abdominal tenderness.     Comments: Soft, but distended vs protuberant- slightly hypoactive, but making some sounds- more than Tuesday when saw her last  Musculoskeletal:     Comments: RUE- biceps 4+/5; WE 4+ to 5-/5; Triceps 4+/5; Grip 4/5; FA 3/5 LUE- biceps 4/5; WE 5-/5; Triceps 4/5; Grip 3+/5; and FA 2/5 RLE- HF 4+/5; KE, DF 4/5; PF 5-/5 and EHL 5-/5 LLE- HF 4-/5; otherwise 4/5 throughout  Skin:    General: Skin is warm and dry.     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 B/L Brisk Hoffman's B/L in Ue's DTR's reduced to 2+ in Ue's and LE's No clonus at wrist or LE's  Psychiatric:        Behavior: Behavior normal.     Comments: Brighter affect     Results for orders placed or performed during the hospital encounter of 01/04/25 (from the past 48 hours)  Glucose, capillary      Status: Abnormal  Collection Time: 01/10/25 11:49 AM  Result Value Ref Range   Glucose-Capillary 345 (H) 70 - 99 mg/dL    Comment: Glucose reference range applies only to samples taken after fasting for at least 8 hours.  Glucose, capillary     Status: Abnormal   Collection Time: 01/10/25  3:41 PM  Result Value Ref Range   Glucose-Capillary 372 (H) 70 - 99 mg/dL    Comment: Glucose reference range applies only to samples taken after fasting for at least 8 hours.  Glucose, capillary     Status: Abnormal   Collection Time: 01/10/25  8:34 PM  Result Value Ref Range   Glucose-Capillary 316 (H) 70 - 99 mg/dL    Comment: Glucose reference range applies only to samples taken after fasting for at least 8 hours.   Comment 1 Notify RN   Glucose, capillary     Status: Abnormal   Collection Time: 01/11/25 12:17 AM  Result Value Ref Range   Glucose-Capillary 283 (H) 70 - 99 mg/dL    Comment: Glucose reference range applies only to samples taken after fasting for at least 8 hours.   Comment 1 Notify RN   CBC with Differential/Platelet     Status: Abnormal   Collection Time: 01/11/25  1:50 AM  Result Value Ref Range   WBC 10.7 (H) 4.0 - 10.5 K/uL   RBC 3.76 (L) 3.87 - 5.11 MIL/uL   Hemoglobin 11.6 (L) 12.0 - 15.0 g/dL   HCT 65.1 (L) 63.9 - 53.9 %   MCV 92.6 80.0 - 100.0 fL   MCH 30.9 26.0 - 34.0 pg   MCHC 33.3 30.0 - 36.0 g/dL   RDW 87.9 88.4 - 84.4 %   Platelets 272 150 - 400 K/uL   nRBC 0.0 0.0 - 0.2 %   Neutrophils Relative % 80 %   Neutro Abs 8.6 (H) 1.7 - 7.7 K/uL   Lymphocytes Relative 12 %   Lymphs Abs 1.3 0.7 - 4.0 K/uL   Monocytes Relative 7 %   Monocytes Absolute 0.7 0.1 - 1.0 K/uL   Eosinophils Relative 0 %   Eosinophils Absolute 0.0 0.0 - 0.5 K/uL   Basophils Relative 0 %   Basophils Absolute 0.0 0.0 - 0.1 K/uL   Immature Granulocytes 1 %   Abs Immature Granulocytes 0.05 0.00 - 0.07 K/uL    Comment: Performed at [facility], [address] [place], [place]  [address]  Comprehensive metabolic panel with GFR     Status: Abnormal   Collection Time: 01/11/25  1:50 AM  Result Value Ref Range   Sodium 134 (L) 135 - 145 mmol/L   Potassium 5.0 3.5 - 5.1 mmol/L   Chloride 101 98 - 111 mmol/L   CO2 23 22 - 32 mmol/L   Glucose, Bld 281 (H) 70 - 99 mg/dL    Comment: Glucose reference range applies only to samples taken after fasting for at least 8 hours.   BUN 35 (H) 6 - 20 mg/dL   Creatinine, Ser 9.06 0.44 - 1.00 mg/dL   Calcium  8.7 (L) 8.9 - 10.3 mg/dL   Total Protein 6.0 (L) 6.5 - 8.1 g/dL   Albumin 3.4 (L) 3.5 - 5.0 g/dL   AST 18 15 - 41 U/L   ALT 13 0 - 44 U/L   Alkaline Phosphatase 74 38 - 126 U/L   Total Bilirubin 0.5 0.0 - 1.2 mg/dL   GFR, Estimated >39 >39 mL/min    Comment: (NOTE) Calculated using  the CKD-EPI Creatinine Equation (2021)    Anion gap 9 5 - 15    Comment: Performed at [facility], [address] [place] [address] [place], [place] [address]  Magnesium     Status: None   Collection Time: 01/11/25  1:50 AM  Result Value Ref Range   Magnesium 1.8 1.7 - 2.4 mg/dL    Comment: Performed at [facility], [address] [place], [place] [address]  Phosphorus     Status: None   Collection Time: 01/11/25  1:50 AM  Result Value Ref Range   Phosphorus 2.8 2.5 - 4.6 mg/dL    Comment: Performed at [facility], [address] [place], [place] [address]  Glucose, capillary     Status: Abnormal   Collection Time: 01/11/25  4:41 AM  Result Value Ref Range   Glucose-Capillary 259 (H) 70 - 99 mg/dL    Comment: Glucose reference range applies only to samples taken after fasting for at least 8 hours.   Comment 1 Notify RN   Glucose, capillary     Status: Abnormal   Collection Time: 01/11/25  6:38 AM  Result Value Ref Range   Glucose-Capillary 247 (H) 70 - 99 mg/dL    Comment: Glucose reference range applies only to samples taken after fasting for at least 8 hours.  Glucose, capillary     Status: Abnormal   Collection Time: 01/11/25  12:35 PM  Result Value Ref Range   Glucose-Capillary 279 (H) 70 - 99 mg/dL    Comment: Glucose reference range applies only to samples taken after fasting for at least 8 hours.  Glucose, capillary     Status: Abnormal   Collection Time: 01/11/25  5:37 PM  Result Value Ref Range   Glucose-Capillary 267 (H) 70 - 99 mg/dL    Comment: Glucose reference range applies only to samples taken after fasting for at least 8 hours.  Glucose, capillary     Status: Abnormal   Collection Time: 01/11/25  9:50 PM  Result Value Ref Range   Glucose-Capillary 237 (H) 70 - 99 mg/dL    Comment: Glucose reference range applies only to samples taken after fasting for at least 8 hours.   Comment 1 Notify RN    Comment 2 Document in Chart   Glucose, capillary     Status: Abnormal   Collection Time: 01/12/25  6:42 AM  Result Value Ref Range   Glucose-Capillary 195 (H) 70 - 99 mg/dL    Comment: Glucose reference range applies only to samples taken after fasting for at least 8 hours.   Comment 1 Notify RN    Comment 2 Document in Chart    No results found.    Blood pressure (!) 150/71, pulse 68, temperature 97.6 F (36.4 C), temperature source Oral, resp. rate 16, height 5' 9 (1.753 m), weight 124 kg, last menstrual period 10/28/2014, SpO2 98%.  Medical Problem List and Plan: 1. 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  Antithrombotics: -DVT/anticoagulation:  Mechanical:  Antiembolism stockings, knee (TED hose) Bilateral lower extremities  -antiplatelet therapy: per Dr. Joshua wait at least 5 days for DVT prophylaxis.   -Vas US -pending  3. Pain Management: Baclofen  5 mg BID. Oxycodone  and robaxin  PRN.  4. Mood/Behavior/Sleep: LCSW to  follow for evaluation and support when available.    -antipsychotic agents: Ativan  0.5 mg bid prn  5. Neuropsych/cognition: This patient is capable of making decisions on her own behalf. 6. Skin/Wound Care: Routine pressure relief measures. Monitor incision sites for signs of infection. Hemo Vac removed 1/15.  7. Fluids/Electrolytes/Nutrition: Monitor I&O and weight. Follow up labs CBC/CMP     -Carb modified diet with supplements to continue.  8. Cervical spinal stenosis: s/p laminectomy by Dr. Joshua 1/12. NSGRY following, continue on decadron  taper.  9. DM2: A1c 7.1. Holding home metformin , glipizide  and januvia .   -Hyperglycemia in the setting of steroids. Monitor glucose ac/hs with SSI Novolog , Lantus  30 units.  10.CKD IIIa: Stable, baseline range 1.1-1.5. Monitor BMP.  11. Acute bronchitis: CT from 1/1 without acute intrathoracic pathology. Completed 10 day course of doxycycline  1/15.    -encourage ins spiro.  12. HLD: Lipitor  80 mg  13. HTN: Holding metoprolol , lisinopril , triamterene -HCTZ. Monitor bp per protocol for need to resume.  Likely has orthostatic hypotension that's common for cervical injuries.  14. Neurogenic bladder: Foley removed on 1/14  monitor PVRs d/t potential for urinary retention.  15. Neurogenic bowel: Continue Senna bid.   Will give Sorbitol  45 cc x1 this afternoon- if no BM today, will give 60cc tomorrow and SSE.      Daphne LOISE Satterfield, NP 01/12/2025   I have personally performed a face to face diagnostic evaluation of this patient and formulated the key components of the plan.  Additionally, I have personally reviewed laboratory data, imaging studies, as well as relevant notes and concur with the physician assistant's documentation above.   The patient's status has not changed from the original H&P.  Any changes in documentation from the acute care chart have been noted above.       [1]  Allergies Allergen Reactions   Shellfish Allergy Other (See Comments)    Tongue and Throat tingling and itching. No  swelling or SOB   "

## 2025-01-12 NOTE — Progress Notes (Signed)
 "    Cornelio Bouchard, MD  Physician Physical Medicine and Rehabilitation   PMR Pre-admission    Signed   Date of Service: 01/10/2025  1:58 PM  Related encounter: ED to Hosp-Admission (Discharged) from 01/04/2025 in [facility]   Signed     Expand All Collapse All  Show:Clear all [x] Written[x] Templated[x] Copied  Added by: [x] Abhijot Straughter, Heron MATSU, RN[x] Lovorn, Megan, MD  [] Hover for details PMR Admission Coordinator Pre-Admission Assessment   Patient: April Warren is an 54 y.o., female MRN: [id number] DOB: 26-Nov-1971 Height: 5' 9 (175.3 cm) Weight: 124 kg   Insurance Information HMO:     PPO:      PCP:      IPA:      80/20:      OTHER: [employer]  ( 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  Updates due to  fax listed above.        Employer:  Benefits:  Phone #: [phone]     Name: 1/13 Eff. Date: 01/10/2019 with no end date listed     Deduct: $150      Out of Pocket Max: (catastrophic cap) $3000      Life Max: none CIR: $250 per day up to 25% of hospital charges ( whichever is less), plus 20% of separated billed services      SNF: $250 per day or up to 25% of hospital charges ( whichever is less), plus 20% of separately billed services Outpatient: $52 per visit     Co-Pay:  Home Health: 100% coverage      Co-Pay:  DME: 80%     Co-Pay: 20% Providers: in network  SECONDARY: none      Policy#:      Phone#:    Artist:       Phone#:    The Best Boy for patients in Inpatient Rehabilitation Facilities with attached Privacy Act Statement-Health Care Records was provided  and verbally reviewed with: Patient and Family   Emergency Contact Information Contact Information       Name Relation Home Work Mobile    Cleary Spouse     [phone]    Alto Dagoberto Nyhan     [phone]    Floretta Eleanor Benne     [phone]         Other Contacts   None on File      Current Medical History  Patient Admitting Diagnosis: cervical myelopathy   History of Present Illness: 54 yo female with history of DM retinopathy and HTN, and CKD. Presented to [facility] ER 01/04/25 with complaints of falls, worsening weakness over the past 2 days. Recently seen by provider 01/02/25 in ER and diagnosed with the flu, bronchitis and sinusitis and started on a course of doxycycline . Has had ongoing LLE weakness since September 2025. Transferred to [facility] on 01/04/25.   Imaging showed findings concerning for spinal canal stenosis and cord compression of cervical spine. 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 Moderate to severe spinal canal stenosis  with cord compression at C5-6 with possible cord signal abnormality favoring myelomalacia. Moderate spinal canal stenosis at C2-3 without cord signal abnormality. 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 Neurosurgery consulted. Underwent C2-7 posterior laminectomy with lateral mass fusion and fixation on 1/12.    Negative Covid, flu and RSV. Hgb A1c 5.1 9/25. SSI for now and CBGS monitoring. Holding oral home meds. Holding home HTN meds of metoprolol , lisinopril , triamterene -HCTZ. Urinary retention with foley placed 1/8. Foley removed 1/14. Lovenox  for DVT prophylaxis.    Complete NIHSS TOTAL: 1   Patient's medical record from [facility] and [facility] has been reviewed by the rehabilitation admission coordinator and physician.   Past Medical History      Past Medical History:   Diagnosis Date   Allergy     Asthma      Certain time of the year   Chicken pox 54 yrs old   Diabetes mellitus 08/12/2012    type 2- dr alvia diagnosed   Diabetic retinopathy (HCC)     Dysmenorrhea 08/20/2012   HTN (hypertension) 08/20/2012   Hyperlipidemia, mixed 08/28/2013   Neuromuscular disorder (HCC)      Not sure of date   Neuropathy 02/16/2017   Obesity     Obesity, unspecified 08/28/2013   Other and unspecified hyperlipidemia 08/28/2013   Preventative health care 08/20/2012   Sinusitis 11/14/2013   Sinusitis, acute 05/02/2016        Has the patient had major surgery during 100 days prior to admission? Yes   Family History   family history includes Alcohol  abuse in her paternal grandfather; Arthritis in her father; Asthma in her father and maternal grandmother; COPD in her father and maternal grandmother; Cancer in her brother; Cancer (age of onset: 21) in her mother; Diabetes in her mother; Gout in her brother; Heart attack in her maternal grandfather; Heart disease in her paternal grandfather; Hyperlipidemia in her father and maternal grandmother; Hypertension in her father, maternal grandfather, maternal grandmother, and mother; Stroke in her maternal grandfather.   Current Medications [Current Medications]  [Current Medications]    Current Facility-Administered Medications:    artificial tears ophthalmic solution 1 drop, 1 drop, Both Eyes, TID PRN, Joshua Alm Hamilton, MD   atorvastatin  (LIPITOR ) tablet 80 mg, 80 mg, Oral, Daily, Joshua Alm Hamilton, MD, 80 mg at 01/12/25 9165   baclofen  (LIORESAL ) tablet 5 mg, 5 mg, Oral, BID, Lovorn, Megan, MD, 5 mg at 01/12/25 9165   Chlorhexidine  Gluconate Cloth 2 % PADS 6 each, 6 each, Topical, Daily, Joshua Alm Hamilton, MD, 6 each at 01/12/25 [phone]   dexamethasone  (DECADRON ) injection 1 mg, 1 mg, Intravenous, Q6H **OR** dexamethasone  (DECADRON ) tablet 1 mg, 1 mg, Oral, Q6H, Meyran, Suzen Lacks, NP, 1 mg at 01/12/25 1046    doxycycline  (VIBRA -TABS) tablet 100 mg, 100 mg, Oral, BID, Joshua Alm Hamilton, MD, 100 mg at 01/12/25 9165   HYDROmorphone  (DILAUDID ) injection 0.5 mg, 0.5 mg, Intravenous, Q2H PRN, Joshua Alm Hamilton, MD, 0.5 mg at 01/09/25 1949   insulin  aspart (novoLOG ) injection 0-15 Units, 0-15 Units, Subcutaneous, TID WC, Perri DELENA Meliton Mickey., MD, 3 Units at 01/12/25 0700   insulin  aspart (novoLOG ) injection 0-5 Units, 0-5 Units, Subcutaneous, QHS, Perri DELENA Meliton Mickey., MD, 2 Units at 01/11/25 2219   insulin  aspart (novoLOG ) injection 7 Units, 7 Units, Subcutaneous, TID WC, Amin, Ankit C, MD, 7 Units at 01/11/25 1748   insulin  glargine (LANTUS ) injection 30 Units, 30 Units, Subcutaneous, Daily, Amin, Ankit C, MD, 30  Units at 01/12/25 1046   LORazepam  (ATIVAN ) tablet 0.5 mg, 0.5 mg, Oral, BID PRN, Joshua Alm Hamilton, MD, 0.5 mg at 01/08/25 2118   menthol  (CEPACOL) lozenge 3 mg, 1 lozenge, Oral, PRN **OR** phenol (CHLORASEPTIC) mouth spray 1 spray, 1 spray, Mouth/Throat, PRN, Joshua Alm Hamilton, MD   methocarbamol  (ROBAXIN ) tablet 500 mg, 500 mg, Oral, Q6H PRN, 500 mg at 01/12/25 0938 **OR** methocarbamol  (ROBAXIN ) injection 500 mg, 500 mg, Intravenous, Q6H PRN, Joshua Alm Hamilton, MD   ondansetron  (ZOFRAN ) tablet 4 mg, 4 mg, Oral, Q6H PRN **OR** ondansetron  (ZOFRAN ) injection 4 mg, 4 mg, Intravenous, Q6H PRN, Joshua Alm Hamilton, MD   Oral care mouth rinse, 15 mL, Mouth Rinse, PRN, Joshua Alm Hamilton, MD   oxyCODONE  (Oxy IR/ROXICODONE ) immediate release tablet 10 mg, 10 mg, Oral, Q3H PRN, Joshua Alm Hamilton, MD, 10 mg at 01/12/25 9061   senna (SENOKOT) tablet 8.6 mg, 1 tablet, Oral, BID, Joshua Alm Hamilton, MD, 8.6 mg at 01/12/25 9165   sodium chloride  flush (NS) 0.9 % injection 3 mL, 3 mL, Intravenous, Q12H, Joshua Alm Hamilton, MD, 3 mL at 01/12/25 9164   sodium chloride  flush (NS) 0.9 % injection 3 mL, 3 mL, Intravenous, PRN, Joshua Alm Hamilton, MD    Patients Current Diet:  Diet Order                   Diet Carb Modified Room service appropriate? Yes  Diet effective now                       Precautions / Restrictions Precautions Precautions: Fall Precaution/Restrictions Comments: cervical Restrictions Weight Bearing Restrictions Per Provider Order: No    Has the patient had 2 or more falls or a fall with injury in the past year? Yes   Prior Activity Level Community (5-7x/wk): independent and driving   Prior Functional Level Self Care: Did the patient need help bathing, dressing, using the toilet or eating? Independent   Indoor Mobility: Did the patient need assistance with walking from room to room (with or without device)? Independent   Stairs: Did the patient need assistance with internal or external stairs (with or without device)? Independent   Functional Cognition: Did the patient need help planning regular tasks such as shopping or remembering to take medications? Independent   Patient Information Are you of Hispanic, Latino/a,or Spanish origin?: A. No, not of Hispanic, Latino/a, or Spanish origin What is your race?: A. White Do you need or want an interpreter to communicate with a doctor or health care staff?: 0. No   Patient's Response To:  Health Literacy and Transportation Is the patient able to respond to health literacy and transportation needs?: Yes Health Literacy - How often do you need to have someone help you when you read instructions, pamphlets, or other written material from your doctor or pharmacy?: Never In the past 12 months, has lack of transportation kept you from medical appointments or from getting medications?: No In the past 12 months, has lack of transportation kept you from meetings, work, or from getting things needed for daily living?: No   Journalist, Newspaper / Equipment Home Equipment: [place] - single point, Wheelchair - manual   Prior Device Use: Indicate devices/aids used by the patient prior to current illness, exacerbation or  injury? None of the above   Current Functional Level Cognition   Orientation Level: Oriented X4    Extremity Assessment (includes Sensation/Coordination)   Upper Extremity Assessment: Right  hand dominant, RUE deficits/detail, LUE deficits/detail RUE Deficits / Details: shoulder flexion 90 degrees, grip strength 4 out 5, sensation changes with tingling today, pt with decreased grasp compared to baseline. pt provided red foam and able to hold fork and cut muffin this session RUE Sensation: decreased light touch RUE Coordination: decreased fine motor LUE Deficits / Details: shoulder flexion 70 degrees. using R UE able to achieve 80 degrees AAROM flexion shoulder. pt decrease grasp 3 out 5 with decreased composite grasp. Pt at baseline has pip flexion 5th digit and 2nd digit turns inward slight. tingling and decrease sensation compared to RUE LUE Sensation: decreased light touch LUE Coordination: decreased fine motor  Lower Extremity Assessment: Defer to PT evaluation RLE Deficits / Details: hip flexion grossly 3, knee extension 4, ankle DF 4 RLE Sensation: history of peripheral neuropathy LLE Deficits / Details: hip flexion 2+, hip extension ~3+, knee extension 3+, ankle DF 3- LLE Sensation: history of peripheral neuropathy     ADLs   Overall ADL's : Needs assistance/impaired Eating/Feeding: Set up, With adaptive utensils Eating/Feeding Details (indicate cue type and reason): patient states she is able to feed self with built up utensils Grooming: Minimal assistance, Bed level Upper Body Bathing: Moderate assistance, Bed level Lower Body Bathing: Total assistance Upper Body Dressing : Moderate assistance Lower Body Dressing: Maximal assistance, Bed level Lower Body Dressing Details (indicate cue type and reason): socks General ADL Comments: pt able to complete self feeding of a muffin this sesison with red foam fork     Mobility   Overal bed mobility: Needs Assistance Bed Mobility:  Sit to Sidelying Rolling: Mod assist Sidelying to sit: +2 for physical assistance, HOB elevated, Max assist Sit to sidelying: Min assist General bed mobility comments: assist to raise legs onto bed     Transfers   Overall transfer level: Needs assistance Equipment used: Rolling walker (2 wheels) Transfers: Sit to/from Stand Sit to Stand: Min assist, +2 physical assistance, Mod assist Bed to/from chair/wheelchair/BSC transfer type:: Step pivot Step pivot transfers: Min assist, +2 physical assistance General transfer comment: cues for hand placement each time;  Sit to stand  from recliner to RW and to sink varied min to mod assist     Ambulation / Gait / Stairs / Wheelchair Mobility   Ambulation/Gait Ambulation/Gait assistance: Min assist, +2 physical assistance, +2 safety/equipment Gait Distance (Feet): 6 Feet (seated rest, 6 ft) Assistive device: Rolling walker (2 wheels) Gait Pattern/deviations: Step-to pattern, Decreased stride length, Decreased dorsiflexion - right, Decreased dorsiflexion - left, Knee hyperextension - right, Knee hyperextension - left, Shuffle, Narrow base of support General Gait Details: steadying assist with initial chair follow; second walk no chair follow needed Gait velocity interpretation: <1.31 ft/sec, indicative of household ambulator Pre-gait activities: lateral wt-shifting x 5 each direction with bil UE support     Posture / Balance Dynamic Sitting Balance Sitting balance - Comments: tends to posteriorly lean and able to correct with verbal cues Balance Overall balance assessment: Needs assistance Sitting-balance support: Feet supported Sitting balance-Leahy Scale: Fair Sitting balance - Comments: tends to posteriorly lean and able to correct with verbal cues Postural control: Posterior lean, Left lateral lean Standing balance support: During functional activity, No upper extremity supported Standing balance-Leahy Scale: Poor Standing balance comment:  bimanual tasks at sink with pelvis against counter; stood for several minutes     Special considerations/life events  Hgb A1c 8.1 9/25 Fall precautions Indwelling catheter place 1/8 and removed 1/14    Previous Home  Environment  Living Arrangements: Spouse/significant other, Children (5 children all under the age of 39 yo)  Lives With: Spouse, Family Available Help at Discharge: Family, Available 24 hours/day Type of Home: House Home Layout: One level Home Access: Ramped entrance Bathroom Shower/Tub: Tub/shower unit, Engineer, Building Services: Handicapped height Bathroom Accessibility: Yes How Accessible: Accessible via walker Home Care Services: No Additional Comments: they have 5 children in the home 3yo- 33 yo adopted.   Discharge Living Setting Plans for Discharge Living Setting: Patient's home, House, Lives with (comment) (spouse and 5 children) Type of Home at Discharge: House Discharge Home Layout: One level Discharge Home Access: Ramped entrance Discharge Bathroom Shower/Tub: Tub/shower unit, Curtain Discharge Bathroom Toilet: Handicapped height Discharge Bathroom Accessibility: Yes How Accessible: Accessible via walker Does the patient have any problems obtaining your medications?: No   Social/Family/Support Systems Patient Roles: Spouse, Parent Contact Information: spouse, [place]wood Anticipated Caregiver: spouse Anticipated Industrial/product Designer Information: see contacts Ability/Limitations of Caregiver: disabled veteran but can provide supervision to min assist Caregiver Availability: 24/7 Discharge Plan Discussed with Primary Caregiver: Yes Is Caregiver In Agreement with Plan?: Yes Does Caregiver/Family have Issues with Lodging/Transportation while Pt is in Rehab?: No   Spouse disabled with history of TBI 34 years ago   Goals Patient/Family Goal for Rehab: supervision to min with PT and OT Expected length of stay: ELOS 3.5 to 4 weeks Pt/Family Agrees to Admission  and willing to participate: Yes Program Orientation Provided & Reviewed with Pt/Caregiver Including Roles  & Responsibilities: Yes   Decrease burden of Care through IP rehab admission: n/a   Possible need for SNF placement upon discharge: not anticipated   Patient Condition: This patient's condition remains as documented in the consult dated 01/10/25, in which the Rehabilitation Physician determined and documented that the patient's condition is appropriate for intensive rehabilitative care in an inpatient rehabilitation facility. Will admit to inpatient rehab today.   Preadmission Screen Completed By:  Alison Heron Lot, RN MSN 01/12/2025 10:51 AM ______________________________________________________________________   Discussed status with Dr. Lovorn on 01/12/25 at 1047 and received approval for admission today.   Admission Coordinator:  Alison Heron Lot, RN MSN time 1047 Date 01/12/25    Assessment/Plan: Diagnosis: C4 ASIA D traumatic quadriplegia/incomplete Does the need for close, 24 hr/day Medical supervision in concert with the patient's rehab needs make it unreasonable for this patient to be served in a less intensive setting? Yes Co-Morbidities requiring supervision/potential complications: Neurogenic bowel and bladder, spasticity, CKD, HTN with new OH; DM with retinopathy and neuropathy; post op pain Due to bladder management, bowel management, safety, skin/wound care, disease management, medication administration, pain management, and patient education, does the patient require 24 hr/day rehab nursing? Yes Does the patient require coordinated care of a physician, rehab nurse, PT, OT to address physical and functional deficits in the context of the above medical diagnosis(es)? Yes Addressing deficits in the following areas: balance, endurance, locomotion, strength, transferring, bowel/bladder control, bathing, dressing, feeding, grooming, and toileting Can the patient  actively participate in an intensive therapy program of at least 3 hrs of therapy 5 days a week? Yes The potential for patient to make measurable gains while on inpatient rehab is good Anticipated functional outcomes upon discharge from inpatient rehab: supervision and min assist PT, supervision and min assist OT, n/a SLP Estimated rehab length of stay to reach the above functional goals is: 3.5 to 4 weeks Anticipated discharge destination: Home 10. Overall Rehab/Functional Prognosis: good     MD Signature:  Revision History  Date/Time User Provider Type Action  01/12/2025 10:54 AM Cornelio Bouchard, MD Physician Sign  01/12/2025 10:51 AM Alison Heron MATSU, RN Rehab Admission Coordinator Share  01/11/2025  2:06 PM Alison Heron MATSU, RN Rehab Admission Coordinator Share  01/11/2025  1:46 PM Alison Heron MATSU, RN Rehab Admission Coordinator Share  01/11/2025 12:08 PM Alison Heron MATSU, RN Rehab Admission Coordinator Share  01/10/2025  3:43 PM Alison Heron MATSU, RN Rehab Admission Coordinator Share  01/10/2025  3:42 PM Alison Heron MATSU, RN Rehab Admission Coordinator Share  01/10/2025  2:12 PM Alison Heron MATSU, RN Rehab Admission Coordinator Share   View Details Report      Routing History  "

## 2025-01-12 NOTE — Progress Notes (Signed)
 NEUROSURGERY PROGRESS NOTE  Doing really well. Strength is improving daily. States she sat up in the chair for a couple hours yesterday. Pain is controlled. Hemovac was removed this morning. Ok to discharge to rehab when bed available.   Temp:  [97.5 F (36.4 C)-99.1 F (37.3 C)] 97.5 F (36.4 C) (01/15 0327) Pulse Rate:  [55-69] 56 (01/15 0327) Resp:  [14-18] 14 (01/15 0012) BP: (146-154)/(58-73) 150/68 (01/15 0012) SpO2:  [93 %-100 %] 97 % (01/15 0327)   April Chiquita Pean, NP 01/12/2025 7:48 AM

## 2025-01-12 NOTE — Progress Notes (Signed)
 Occupational Therapy Treatment Patient Details Name: April Warren MRN: [id number] DOB: 01-23-1971 Today's Date: 01/12/2025   History of present illness 54 year old female presented to ED 01/04/25 s/p falls and increasing weakness x 2 days.  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 1/12 PLA C2-3 C3-4 C4-5 C5-6 C6-7 segmental lateral mass fixation C2-C7 utilizing ATEC lateral mass screws PMH significant for diabetic retinopathy, HTN   OT comments  Patient up in recliner upon entry and states has been OOB for 2 hours but willing to participate with OT/PT before returning to bed. Patient demonstrating good gains with therapy with sit to stand, mobility, standing balance, and transfers. Patient able to stand at sink for grooming tasks with min assist to perform and CGA +2 for balance. Patient will benefit from intensive inpatient follow-up therapy, >3 hours/day.  Acute OT to continue to follow to address established goals to facilitate DC to next venue of care.        If plan is discharge home, recommend the following:  Two people to help with walking and/or transfers;Two people to help with bathing/dressing/bathroom   Equipment Recommendations  Wheelchair (measurements OT);Wheelchair cushion (measurements OT);Hospital bed;BSC/3in1    Recommendations for Other Services      Precautions / Restrictions Precautions Precautions: Fall Recall of Precautions/Restrictions: Intact Precaution/Restrictions Comments: cervical Restrictions Weight Bearing Restrictions Per Provider Order: No       Mobility Bed Mobility Overal bed mobility: Needs Assistance Bed Mobility: Sit to Sidelying         Sit to sidelying: Min assist General bed mobility comments: assistance to shift hips once in sidelying    Transfers Overall transfer level: Needs assistance Equipment used: Rolling walker (2  wheels) Transfers: Sit to/from Stand Sit to Stand: Min assist, +2 physical assistance, Mod assist           General transfer comment: cues for hand placement with difficulty pushing up with RUE due to shoulder pain     Balance Overall balance assessment: Needs assistance Sitting-balance support: Feet supported Sitting balance-Leahy Scale: Fair     Standing balance support: During functional activity, No upper extremity supported Standing balance-Leahy Scale: Poor Standing balance comment: stood at sink with hips against counter to perform grooming tasks                           ADL either performed or assessed with clinical judgement   ADL Overall ADL's : Needs assistance/impaired     Grooming: Wash/dry hands;Wash/dry face;Oral care;Minimal assistance;Standing Grooming Details (indicate cue type and reason): stood at sink with assistance for balance and opening toothpaste                                    Extremity/Trunk Assessment Upper Extremity Assessment RUE Coordination: decreased fine motor LUE Deficits / Details: shoulder flexion 70 degrees. using R UE able to achieve 80 degrees AAROM flexion shoulder. pt decrease grasp 3 out 5 with decreased composite grasp. Pt at baseline has pip flexion 5th digit and 2nd digit turns inward slight. tingling and decrease sensation compared to RUE LUE Sensation: decreased light touch LUE Coordination: decreased fine motor            Vision       Perception     Praxis     Communication Communication Communication: No apparent difficulties  Cognition Arousal: Alert Behavior During Therapy: WFL for tasks assessed/performed Cognition: No apparent impairments                               Following commands: Intact        Cueing   Cueing Techniques: Verbal cues  Exercises Exercises: Other exercises Other Exercises Other Exercises: squeeze ball provided to patient to address  BUE grip strength, patient stating too difficulty to perform and was given pink foam block with better results.    Shoulder Instructions       General Comments Husband present and supportive    Pertinent Vitals/ Pain       Pain Assessment Pain Assessment: 0-10 Pain Score: 8  Pain Location: R shoulder>neck Pain Descriptors / Indicators: Discomfort, Grimacing, Guarding, Moaning Pain Intervention(s): Limited activity within patient's tolerance, Monitored during session, Repositioned, Patient requesting pain meds-RN notified  Home Living                                          Prior Functioning/Environment              Frequency  Min 3X/week        Progress Toward Goals  OT Goals(current goals can now be found in the care plan section)  Progress towards OT goals: Progressing toward goals  Acute Rehab OT Goals Patient Stated Goal: to go to rehab then home OT Goal Formulation: With patient Time For Goal Achievement: 01/20/25 Potential to Achieve Goals: Good ADL Goals Pt Will Perform Eating: with set-up;sitting;with adaptive utensils Pt Will Perform Grooming: with set-up;sitting;with adaptive equipment Pt Will Perform Upper Body Bathing: with min assist;sitting Pt Will Perform Upper Body Dressing: with min assist;with adaptive equipment;sitting Pt Will Transfer to Toilet: with +2 assist;with min assist;stand pivot transfer Additional ADL Goal #1: pt will complete bed mobility with mod (A)  Plan      Co-evaluation    PT/OT/SLP Co-Evaluation/Treatment: Yes Reason for Co-Treatment: Complexity of the patient's impairments (multi-system involvement);For patient/therapist safety;To address functional/ADL transfers PT goals addressed during session: Mobility/safety with mobility;Balance OT goals addressed during session: ADL's and self-care;Strengthening/ROM      AM-PAC OT 6 Clicks Daily Activity     Outcome Measure   Help from another person  eating meals?: A Little Help from another person taking care of personal grooming?: A Lot Help from another person toileting, which includes using toliet, bedpan, or urinal?: Total Help from another person bathing (including washing, rinsing, drying)?: Total Help from another person to put on and taking off regular upper body clothing?: A Lot Help from another person to put on and taking off regular lower body clothing?: Total 6 Click Score: 10    End of Session Equipment Utilized During Treatment: Gait belt;Rolling walker (2 wheels)  OT Visit Diagnosis: Unsteadiness on feet (R26.81);Muscle weakness (generalized) (M62.81)   Activity Tolerance Patient tolerated treatment well;Patient limited by pain   Patient Left in bed;with call bell/phone within reach;with bed alarm set;with family/visitor present   Nurse Communication Mobility status;Precautions        Time: 9065-8998 OT Time Calculation (min): 27 min  Charges: OT General Charges $OT Visit: 1 Visit OT Treatments $Self Care/Home Management : 8-22 mins  Dick Laine, OTA Acute Rehabilitation Services  Office [phone]   BEATRIZ QUINTELA 01/12/2025, 12:09 PM

## 2025-01-12 NOTE — Progress Notes (Signed)
 Met with patient to review current situation, team conference and plan of care. Reviewed bowel program, bladder program , pressure injury prevention, safety, medications, diabetes, hypertension, incision care. Continue to follow along to provide educational needs to facilitate preparation for discharge.

## 2025-01-12 NOTE — Progress Notes (Signed)
 "    Cornelio Bouchard, MD  Physician Physical Medicine and Rehabilitation   Consult Note    Signed   Date of Service: 01/10/2025  2:27 PM  Related encounter: ED to Hosp-Admission (Discharged) from 01/04/2025 in [facility]   Signed     Expand All Collapse All  Show:Clear all [x] Written[x] Templated[] Copied  Added by: [x] Lovorn, Megan, MD  [] Hover for details          Physical Medicine and Rehabilitation Consult Reason for Consult:CIR/SCI Referring Physician: Dr Perri and [facility]     HPI: April Warren is a 54 y.o.  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    Due to AKI on CKD with Cr of 1.6 down to 1.18 and acute bronchitis, pt's surgery was delayed for 1 week.     Pt underwent surgery by Dr Joshua yesterday- PLA C2-C7- with screws placed- since surgery, having severe R shoulder pain- per pt, Dr Joshua told her he taped the shoulder down and that's why hurting so much. Also obviously Neck pain- ~ 8/10.  Using Oxy q3-4 hours- working well when takes it. Was using tylenol  prior to surgery.    On Decadron  4mg  q6 hours- BG's are 200's 300's and not on home DM meds as detailed in plan.    Foley placed due to urinary retention in ED and LBM 1/10 - multiple large BM's but had a few accidents due to not being hold it a long time. Reports that usually has bowel urgency after eats.    Also reports whole body spasms occ- mainly when they lay her back in bed- not specific to any time of day.    N/T in fingers and toes but has neuropathy of feet, not hands.        Review of Systems  Constitutional:  Positive for malaise/fatigue.  HENT: Negative.    Eyes: Negative.   Respiratory:  Negative for cough and shortness of breath.        Done with bronchitis   Cardiovascular:  Negative for chest pain and leg swelling.  Gastrointestinal:  Positive for constipation. Negative for nausea and vomiting.  Genitourinary:  Negative for flank pain.       Has foley- due to urinary retention  Musculoskeletal:  Positive for falls, myalgias and neck pain.  Skin: Negative.   Neurological:  Positive for tingling, sensory change, focal weakness and weakness.  Endo/Heme/Allergies: Negative.   Psychiatric/Behavioral:  The patient is nervous/anxious and has insomnia.   All other systems reviewed and are negative.      Past Medical History:  Diagnosis Date   Allergy     Asthma      Certain time of the year   Chicken pox 54 yrs old   Diabetes mellitus 08/12/2012    type 2- dr alvia diagnosed   Diabetic retinopathy (HCC)     Dysmenorrhea 08/20/2012   HTN (hypertension) 08/20/2012   Hyperlipidemia, mixed 08/28/2013   Neuromuscular disorder (HCC)      Not sure of date   Neuropathy 02/16/2017   Obesity     Obesity, unspecified 08/28/2013   Other and unspecified hyperlipidemia 08/28/2013   Preventative health care 08/20/2012   Sinusitis 11/14/2013   Sinusitis, acute 05/02/2016  Past Surgical History:  Procedure Laterality Date   BREAST BIOPSY Right 01/29/2024    US  RT BREAST BX W LOC DEV 1ST LESION IMG BX SPEC US  GUIDE 01/29/2024 [facility]AMMOGR[facility]HY   EYE SURGERY        Laser surgery for bleeds   REFRACTIVE SURGERY   08/12/2012    right, left on 09/16/12             Family History  Problem Relation Age of Onset   Cancer Mother 64        breast   Diabetes Mother          type 2   Hypertension Mother     Hypertension Father     Hyperlipidemia Father     COPD Father     Arthritis Father     Asthma Father     Hypertension Maternal Grandmother     Hyperlipidemia Maternal Grandmother     COPD Maternal Grandmother     Asthma Maternal Grandmother     Hypertension Maternal Grandfather     Heart attack Maternal Grandfather      Stroke Maternal Grandfather     Gout Brother     Cancer Brother          lung   Heart disease Paternal Grandfather     Alcohol  abuse Paternal Grandfather          Social History:  reports that she has never smoked. She has never used smokeless tobacco. She reports current alcohol  use. She reports that she does not use drugs. Allergies: [Allergies]  [Allergies]      Allergen Reactions   Shellfish Allergy Other (See Comments)      Tongue and Throat tingling and itching. No swelling or SOB         Medications Prior to Admission  Medication Sig Dispense Refill   albuterol  (PROAIR  HFA) 108 (90 Base) MCG/ACT inhaler USE 2 INHALATIONS EVERY 6 HOURS AS NEEDED FOR WHEEZING OR SHORTNESS OF BREATH (Patient taking differently: 2 puffs every 6 (six) hours as needed for wheezing or shortness of breath.) 18 g 5   aspirin EC 81 MG tablet Take 81 mg by mouth daily. Swallow whole.       atorvastatin  (LIPITOR ) 80 MG tablet TAKE 1 TABLET DAILY 90 tablet 3   cetirizine  (ZYRTEC ) 10 MG tablet Take 1 tablet (10 mg total) by mouth daily as needed for allergies or rhinitis. 90 tablet 1   doxycycline  (VIBRAMYCIN ) 100 MG capsule Take 1 capsule (100 mg total) by mouth 2 (two) times daily. 20 capsule 0   ezetimibe  (ZETIA ) 10 MG tablet Take 1 tablet (10 mg total) by mouth daily. 90 tablet 3   fenofibrate  micronized (LOFIBRA) 134 MG capsule Take 1 capsule (134 mg total) by mouth daily before breakfast. 90 capsule 1   gabapentin  (NEURONTIN ) 300 MG capsule TAKE 1 CAPSULE TWICE A DAY AND 2 CAPSULES AT BEDTIME 360 capsule 3   glipiZIDE  (GLUCOTROL ) 10 MG tablet Take 0.5 tablets (5 mg total) by mouth 2 (two) times daily before a meal. (Patient taking differently: Take 10 mg by mouth daily before breakfast.) 180 tablet 1   ibuprofen (ADVIL,MOTRIN) 200 MG tablet Take 400 mg by mouth every 8 (eight) hours as needed for mild pain (pain score 1-3).       JANUVIA  100 MG tablet TAKE 1 TABLET DAILY 90 tablet 3   lisinopril   (ZESTRIL ) 20 MG tablet Take 1 tablet (20 mg total) by mouth 2 (two) times  daily. 180 tablet 1   LORazepam  (ATIVAN ) 1 MG tablet TAKE 1/2 TABLET BY MOUTH TWICE DAILY AS NEEDED FOR ANXIETY (Patient taking differently: Take 0.5 mg by mouth at bedtime.) 90 tablet 1   Magnesium Oxide 400 MG CAPS Take 1 capsule by mouth daily. Pt is taking 825mg  capsule every other day. (Patient taking differently: Take 1 capsule by mouth daily.)       metFORMIN  (GLUCOPHAGE -XR) 750 MG 24 hr tablet Take 1 tablet (750 mg total) by mouth daily with breakfast.       metoprolol  tartrate (LOPRESSOR ) 50 MG tablet TAKE 1 TABLET TWICE A DAY 180 tablet 3   montelukast  (SINGULAIR ) 10 MG tablet TAKE 1 TABLET AT BEDTIME 90 tablet 3   Potassium Bicarbonate 99 MG CAPS Take 2 capsules by mouth daily.       triamterene -hydrochlorothiazide  (MAXZIDE-25) 37.5-25 MG tablet TAKE 1 TABLET DAILY 90 tablet 3   Vitamin D , Ergocalciferol , (DRISDOL ) 1.25 MG (50000 UNIT) CAPS capsule TAKE 1 CAPSULE EVERY 7 DAYS 12 capsule 3   Blood Glucose Monitoring Suppl (FREESTYLE FREEDOM LITE) w/Device KIT Check blood sugar twice daily 1 each 0   glucose blood test strip Use as instructed 100 each 12   Lancets (FREESTYLE) lancets Check blood sugar twice daily 200 each 5   meloxicam  (MOBIC ) 7.5 MG tablet Take 1-2 tablets (7.5-15 mg total) by mouth daily. (Patient not taking: Reported on 01/04/2025) 40 tablet 1          Home: Home Living Family/patient expects to be discharged to:: Private residence Living Arrangements: Spouse/significant other, Children (5 children all under the age of 51 yo) Available Help at Discharge: Family, Available 24 hours/day Type of Home: House Home Access: Ramped entrance Home Layout: One level Bathroom Shower/Tub: Tub/shower unit, Engineer, Building Services: Handicapped height Bathroom Accessibility: Yes Home Equipment: Cane - single point, Wheelchair - manual Additional Comments: they have 5 children in the home 3yo- 40 yo  adopted.  Lives With: Spouse, Family  Functional History: Prior Function Prior Level of Function : Independent/Modified Independent, History of Falls (last six months) Mobility Comments: prior to christmas; 2 falls (one slip in shower, one step off curb); then getting out of car fell backwards Functional Status:  Mobility: Bed Mobility Overal bed mobility: Needs Assistance Bed Mobility: Rolling, Sidelying to Sit, Sit to Sidelying Rolling: Mod assist Sidelying to sit: +2 for physical assistance, HOB elevated, Max assist Sit to sidelying: Mod assist, +2 for physical assistance General bed mobility comments: rolling for pad placement; from left side, assisted to lower legs over EOB, HOB elevated to initiate side to sit and come to sit +2 max; guiding upper body with sit to side and assist elevating bil LEs Transfers Overall transfer level: Needs assistance Equipment used: 2 person hand held assist Transfers: Sit to/from Stand Sit to Stand: Min assist, Mod assist, From elevated surface General transfer comment: bed elevated ~4; initial stand with +2 mod assist (pt hooking elbows with each therapist and use of bed pad); second attempt +2 min assist with better upright posture obtained Ambulation/Gait Pre-gait activities: lateral wt-shifting x 5 each direction with bil UE support   ADL: ADL Overall ADL's : Needs assistance/impaired Eating/Feeding: Minimal assistance, Bed level Grooming: Minimal assistance, Bed level Upper Body Bathing: Moderate assistance, Bed level Lower Body Bathing: Total assistance Upper Body Dressing : Moderate assistance Lower Body Dressing: Maximal assistance General ADL Comments: pt able to complete self feeding of a muffin this sesison with red foam fork  Cognition: Cognition Orientation Level: Oriented X4 Cognition Arousal: Alert Behavior During Therapy: WFL for tasks assessed/performed   Blood pressure (!) 131/54, pulse 71, temperature 98.4 F (36.9  C), temperature source Oral, resp. rate 15, height 5' 9 (1.753 m), weight 124 kg, last menstrual period 10/28/2014, SpO2 95%. Physical Exam Vitals and nursing note reviewed.  Constitutional:      Appearance: Normal appearance. She is obese.     Comments: Awake, alert, appropriate, sitting up in bed; NAD  HENT:     Head: Normocephalic and atraumatic.     Comments: No facial asymmetry    Right Ear: External ear normal.     Left Ear: External ear normal.     Nose: Nose normal. No congestion.     Mouth/Throat:     Mouth: Mucous membranes are dry.     Pharynx: Oropharynx is clear. No oropharyngeal exudate.  Eyes:     General:        Right eye: No discharge.        Left eye: No discharge.     Extraocular Movements: Extraocular movements intact.  Neck:     Comments: Posterior incision with original surgical dressing in place- moderate assosciated swelling- betadine still on skin surrounding Cardiovascular:     Rate and Rhythm: Normal rate and regular rhythm.     Heart sounds: Normal heart sounds. No murmur heard.    No gallop.  Pulmonary:     Effort: Pulmonary effort is normal. No respiratory distress.     Breath sounds: Normal breath sounds. No wheezing, rhonchi or rales.  Abdominal:     Palpations: Abdomen is soft.     Comments: Hypoactive-  NT, ND  Genitourinary:    Comments: Foley in place (+)- medium amber urine Musculoskeletal:     Comments: RUE- deltoid 4-/5; Biceps 4-/5; WE 4/5; Triceps 4-/5; WE 5-/5; Grip 5-/5; FA 2/5 LUE- Deltoid 4-/5; biceps 4/5; WE 4+/5; triceps 4/5; Grip 3-/5, FA 2-/5 RLE- HF 4+/5; KE 4/5; DF/PF 4+/5; EHL 4+/5 LLE- HF 2/5; KE 4-/5; DF 2-/5 PF 2/5; EHL 2-/5  Skin:    General: Skin is warm and dry.     Comments: Surgical incision on neck- covered with  surgical dressing L hand IV-  Neurological:     Mental Status: She is alert and oriented to person, place, and time.     Comments: Intact mons and buttock sensation B/L- didn't do rectal exam due to  pt's ASIA D Decreased to light touch from C6 (starting at C6) downwards on R and C5 (starting at C5) downwards on L Intact sensation to light touch in torso and L1-L3 B/L Decreased to light touch L4-S1- but has neuropathy MAS of 1 in Arms/Ue's (increased tone in arms- mild) , but MAS of 0 in Les-(no spasticity in legs) Hoffmans (+) B/L brisk in fingers No clonus in wrist or ankles B/L DTR's 2+ in Ue's and 3+ in LE's which is unusual due to her DM and neuropathy Ox3    Psychiatric:     Comments: Slightly depressed and anxious, but very appropriate       Lab Results Last 24 Hours       Results for orders placed or performed during the hospital encounter of 01/04/25 (from the past 24 hours)  Glucose, capillary     Status: Abnormal    Collection Time: 01/09/25  2:31 PM  Result Value Ref Range    Glucose-Capillary 204 (H) 70 - 99 mg/dL  Glucose, capillary  Status: Abnormal    Collection Time: 01/09/25  4:36 PM  Result Value Ref Range    Glucose-Capillary 219 (H) 70 - 99 mg/dL  Glucose, capillary     Status: Abnormal    Collection Time: 01/09/25  6:03 PM  Result Value Ref Range    Glucose-Capillary 279 (H) 70 - 99 mg/dL  Glucose, capillary     Status: Abnormal    Collection Time: 01/09/25  8:07 PM  Result Value Ref Range    Glucose-Capillary 248 (H) 70 - 99 mg/dL    Comment 1 Notify RN      Comment 2 Document in Chart    Glucose, capillary     Status: Abnormal    Collection Time: 01/09/25 11:27 PM  Result Value Ref Range    Glucose-Capillary 274 (H) 70 - 99 mg/dL    Comment 1 Notify RN      Comment 2 Document in Chart    CBC with Differential/Platelet     Status: Abnormal    Collection Time: 01/10/25  2:09 AM  Result Value Ref Range    WBC 12.0 (H) 4.0 - 10.5 K/uL    RBC 3.66 (L) 3.87 - 5.11 MIL/uL    Hemoglobin 11.4 (L) 12.0 - 15.0 g/dL    HCT 65.9 (L) 63.9 - 46.0 %    MCV 92.9 80.0 - 100.0 fL    MCH 31.1 26.0 - 34.0 pg    MCHC 33.5 30.0 - 36.0 g/dL    RDW 87.8 88.4  - 84.4 %    Platelets 319 150 - 400 K/uL    nRBC 0.0 0.0 - 0.2 %    Neutrophils Relative % 83 %    Neutro Abs 9.9 (H) 1.7 - 7.7 K/uL    Lymphocytes Relative 12 %    Lymphs Abs 1.5 0.7 - 4.0 K/uL    Monocytes Relative 5 %    Monocytes Absolute 0.6 0.1 - 1.0 K/uL    Eosinophils Relative 0 %    Eosinophils Absolute 0.0 0.0 - 0.5 K/uL    Basophils Relative 0 %    Basophils Absolute 0.0 0.0 - 0.1 K/uL    Immature Granulocytes 0 %    Abs Immature Granulocytes 0.05 0.00 - 0.07 K/uL  Comprehensive metabolic panel with GFR     Status: Abnormal    Collection Time: 01/10/25  2:09 AM  Result Value Ref Range    Sodium 134 (L) 135 - 145 mmol/L    Potassium 4.8 3.5 - 5.1 mmol/L    Chloride 101 98 - 111 mmol/L    CO2 22 22 - 32 mmol/L    Glucose, Bld 279 (H) 70 - 99 mg/dL    BUN 49 (H) 6 - 20 mg/dL    Creatinine, Ser 8.81 (H) 0.44 - 1.00 mg/dL    Calcium  8.7 (L) 8.9 - 10.3 mg/dL    Total Protein 6.0 (L) 6.5 - 8.1 g/dL    Albumin 3.5 3.5 - 5.0 g/dL    AST 21 15 - 41 U/L    ALT 18 0 - 44 U/L    Alkaline Phosphatase 66 38 - 126 U/L    Total Bilirubin 0.4 0.0 - 1.2 mg/dL    GFR, Estimated 55 (L) >60 mL/min    Anion gap 11 5 - 15  Magnesium     Status: None    Collection Time: 01/10/25  2:09 AM  Result Value Ref Range    Magnesium 1.7 1.7 - 2.4 mg/dL  Phosphorus  Status: None    Collection Time: 01/10/25  2:09 AM  Result Value Ref Range    Phosphorus 4.2 2.5 - 4.6 mg/dL  Glucose, capillary     Status: Abnormal    Collection Time: 01/10/25  4:35 AM  Result Value Ref Range    Glucose-Capillary 251 (H) 70 - 99 mg/dL  Glucose, capillary     Status: Abnormal    Collection Time: 01/10/25  8:40 AM  Result Value Ref Range    Glucose-Capillary 225 (H) 70 - 99 mg/dL  Glucose, capillary     Status: Abnormal    Collection Time: 01/10/25 11:49 AM  Result Value Ref Range    Glucose-Capillary 345 (H) 70 - 99 mg/dL      Imaging Results (Last 48 hours)  DG Cervical Spine 2 or 3 views Result  Date: 01/10/2025 EXAM: FLUOROSCOPIC IMAGING TECHNIQUE: Fluoroscopy was provided by the radiology department for procedure. Radiologist was not present during examination. RADIATION DOSE INDEX: Fluoro time: 23.5 seconds. Reference Air Kerma: 5.22 mGy. COMPARISON: MRI cervical spine dated 04/15/2023. CT cervical spine dated 01/02/2025. CLINICAL HISTORY: [id number] Surgery, elective [id number]. FINDINGS: Intraoperative fluoroscopic imaging was performed. Intraoperative placement of posterior cervical fusion and foraminotomy at the C2 to C7 levels. A total of 4 intraoperative low-resolution images of the cervical spine were obtained. IMPRESSION: 1. Intraoperative fluoroscopic imaging as above. Please refer to the operative report for full details. Electronically signed by: Morgane Naveau MD MD 01/10/2025 12:46 AM EST RP Workstation: [id number]    DG C-Arm 1-60 Min-No Report Result Date: 01/09/2025 Fluoroscopy was utilized by the requesting physician.  No radiographic interpretation.    DG C-Arm 1-60 Min-No Report Result Date: 01/09/2025 Fluoroscopy was utilized by the requesting physician.  No radiographic interpretation.    DG C-Arm 1-60 Min-No Report Result Date: 01/09/2025 Fluoroscopy was utilized by the requesting physician.  No radiographic interpretation.          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 Yes Co-Morbidities requiring supervision/potential complications: Neurogenic bowel and bladder, spasticity, post op pain;  DM- uncontrolled, Diabetic neuropathy and retinopathy; A1c 7.1; Class 3 obesity; HTN.  Due to bladder management, bowel management, safety, skin/wound care, disease management, medication administration, pain management, and patient education, does the patient require 24  hr/day rehab nursing? Yes Does the patient require coordinated care of a physician, rehab nurse, therapy disciplines of PT and OT to address physical and functional deficits in the context of the above medical diagnosis(es)? Yes Addressing deficits in the following areas: balance, endurance, locomotion, strength, transferring, bowel/bladder control, bathing, dressing, feeding, grooming, toileting, and cognition Can the patient actively participate in an intensive therapy program of at least 3 hrs of therapy per day at least 5 days per week? Yes The potential for patient to make measurable gains while on inpatient rehab is good Anticipated functional outcomes upon discharge from inpatient rehab are min assist  with PT, min assist with OT, n/a with SLP. Estimated rehab length of stay to reach the above functional goals is: 3.5 to 4 weeks Anticipated discharge destination: Home Overall Rehab/Functional Prognosis: good   RECOMMENDATIONS: This patient's condition is appropriate for continued rehabilitative care in the following setting: CIR Patient has agreed to participate in recommended program. Yes Note that insurance prior authorization may be required for reimbursement for  recommended care.   Comment:  Will order Baclofen  5 mg BID- since has had renal issues, and Current Cr 1.18, it's OK to do, but won't increase til monitor Renal issues in more depth - pt having tone/tightness and spasticity/spasms from SCI.   2. Suggest restarting home doses of DM meds- on Januvia , Glipzide 5 mg BID, and Metformin  750 mg qday at home.   3. Once comes to inpt rehab, will remove Foley and teach in/out caths- once BP is more stable strongly suggest starting Flomax 0.4 mg Qsupper to try and help her void once foley removed.    4. Has neurogenic bowel- wait for bowel program quite yet, con't Senna BID and if no BM by tomorrow, give Miralax .    5. Spoke with admissions coordinator- will file for CIR.   6. Thank  you for this consult   7. Did discuss general prognosis- will likely be able to walk, however Since it's the first day, it's hard to know if will need a RW long term- is not a central cord at this time, based on strength exam as of now.        Megan Lovorn, MD 01/10/2025      I spent a total of  86   minutes on total care today- >50% coordination of care- due to  D/w pt - interview, ASIA exam, which includes strength exam and sensory exam; as well as review of chart, therapy notes, and orders- made order changes and documentation.          Routing History  "

## 2025-01-12 NOTE — Progress Notes (Signed)
" ° °  Inpatient Rehabilitation Admissions Coordinator   I have CIR bed to admit her today. I will make the arrangements.  Heron Leavell, RN, MSN Rehab Admissions Coordinator [phone] 01/12/2025 9:42 AM  "

## 2025-01-12 NOTE — TOC Transition Note (Signed)
 Transition of Care [facility]) - Discharge Note   Patient Details  Name: April Warren MRN: [id number] Date of Birth: [birth year]  Transition of Care [facility]) CM/SW Contact:  Almarie CHRISTELLA Goodie, LCSW Phone Number: 01/12/2025, 9:46 AM   Clinical Narrative:   Patient discharging to CIR today. No further inpatient care management needs.    Final next level of care: IP Rehab Facility Barriers to Discharge: Continued Medical Work up   Patient Goals and CMS Choice     Choice offered to / list presented to : Patient      Discharge Placement                       Discharge Plan and Services Additional resources added to the After Visit Summary for     Discharge Planning Services: CM Consult                                 Social Drivers of Health (SDOH) Interventions SDOH Screenings   Food Insecurity: No Food Insecurity (01/04/2025)  Housing: Low Risk (01/04/2025)  Transportation Needs: No Transportation Needs (01/04/2025)  Utilities: Not At Risk (01/04/2025)  Alcohol  Screen: Low Risk (04/29/2023)  Depression (PHQ2-9): Medium Risk (01/02/2025)  Financial Resource Strain: Medium Risk (06/09/2024)  Physical Activity: Inactive (06/09/2024)  Social Connections: Moderately Integrated (01/04/2025)  Stress: Stress Concern Present (06/09/2024)  Tobacco Use: Low Risk (01/09/2025)     Readmission Risk Interventions     No data to display

## 2025-01-12 NOTE — H&P (Signed)
 "     Physical Medicine and Rehabilitation Admission H&P        Chief Complaint  Patient presents with   Functional deficits due to spinal cord injury     HPI: April Warren is a 54 year old right handed female with PMHx T2DM, diabetic retinopathy, HTN, HLD, CKD 3a, obesity and 3 previous falls presented to [facility] on 01/02/2025 with complaints of falls and worsening weakness over the past 2 days. Per chart review she had been seen by her PCP where she was treated for bronchitis and sinusitis with course of doxycyline. She presented to the ER later that evening after slipping backwards going up a ramp hitting her head, neck, left shoulder. She denied LOC or focal weakness at that time. Taking aspirin, nose bleed controlled and vitals were stable with EMS. Of note patient reported ongoing left lower extremity weakness since September 2025 and onset of left upper extremities since onset. Denies bowel or bladder problems.    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 Neurosurgery consulted recommended MRI C-spine and transfer to [facility]. 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 Moderate to severe spinal canal stenosis with cord compression at C5-6 with possible cord signal abnormality favoring myelomalacia. Moderate spinal canal stenosis at C2-3 without cord signal abnormality. 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 Due to AKI and CKD with cr of 1.6 and acute bronchitis, surgery was delayed for 1 week. The patient underwent C2-C7 posterior laminectomy with lateral mass fusion an fixation by Dr. Joshua on 1/12. Hospital course complicated by post operative urinary retention requiring foley catheter, now removed. Negative Covid, flu and RSV. Hgb A1c 5.1  9/25. SSI for now and CBGS monitoring. Holding oral home meds. Holding home HTN meds of metoprolol , lisinopril , triamterene -hydrochlorothiazide . DVT prophylaxis TED hose until able to resume Lovenox .    The patient was independent and driving without use of DME. She lives in a one level home with ramped entrance with her spouse and 5 underage children. Working with PT/OT requires Min assist +2 physical assistance for ambulation of 6 ft. Transfers with min to moderate assist and completes ADLs at bed level with mod to max assist. Therapy evaluations completed due to patient decreased functional mobility was admitted for a comprehensive rehab program.   Pt reports LBM 1/10. Feeling very constipated Has been voiding but frequently   Pain really bad this AM- and with PT/PT- got up to easily 6-7/10- 5/10 with meds- they make her sleepy, so doesn't want to increase dose currently.    Spasms also better with Baclofen  5 mg BID - only a few spasms today.      Review of Systems  Constitutional:  Positive for malaise/fatigue.  HENT: Negative.  Negative for hearing loss.   Eyes: Negative.  Negative for blurred vision and double vision.  Respiratory:  Negative for cough, shortness of breath and wheezing.   Cardiovascular:  Negative for chest pain and leg swelling.  Gastrointestinal:  Positive for constipation. Negative for diarrhea, nausea and vomiting.  Genitourinary:  Positive for frequency.       Foley removed and is now voiding  Musculoskeletal:  Positive for falls, joint pain, myalgias and neck pain.  Skin: Negative.   Neurological:  Positive for tingling, sensory change, focal weakness and weakness. Negative for  dizziness and headaches.  Endo/Heme/Allergies: Negative.   Psychiatric/Behavioral:  Positive for depression. The patient is not nervous/anxious.   All other systems reviewed and are negative.                Past Medical History:  Diagnosis Date   Allergy     Asthma       Certain time of the year   Chicken pox 54 yrs old   Diabetes mellitus 08/12/2012    type 2- dr alvia diagnosed   Diabetic retinopathy (HCC)     Dysmenorrhea 08/20/2012   HTN (hypertension) 08/20/2012   Hyperlipidemia, mixed 08/28/2013   Neuromuscular disorder (HCC)      Not sure of date   Neuropathy 02/16/2017   Obesity     Obesity, unspecified 08/28/2013   Other and unspecified hyperlipidemia 08/28/2013   Preventative health care 08/20/2012   Sinusitis 11/14/2013   Sinusitis, acute 05/02/2016             Past Surgical History:  Procedure Laterality Date   BREAST BIOPSY Right 01/29/2024    US  RT BREAST BX W LOC DEV 1ST LESION IMG BX SPEC US  GUIDE 01/29/2024 [facility] MAMMOGRAPHY   EYE SURGERY        Laser surgery for bleeds   REFRACTIVE SURGERY   08/12/2012    right, left on 09/16/12             Family History  Problem Relation Age of Onset   Cancer Mother 67        breast   Diabetes Mother          type 2   Hypertension Mother     Hypertension Father     Hyperlipidemia Father     COPD Father     Arthritis Father     Asthma Father     Hypertension Maternal Grandmother     Hyperlipidemia Maternal Grandmother     COPD Maternal Grandmother     Asthma Maternal Grandmother     Hypertension Maternal Grandfather     Heart attack Maternal Grandfather     Stroke Maternal Grandfather     Gout Brother     Cancer Brother          lung   Heart disease Paternal Grandfather     Alcohol  abuse Paternal Grandfather          Social History:  reports that she has never smoked. She has never used smokeless tobacco. She reports current alcohol  use. She reports that she does not use drugs. Allergies: [Allergies]  [Allergies]      Allergen Reactions   Shellfish Allergy Other (See Comments)      Tongue and Throat tingling and itching. No swelling or SOB         Medications Prior to Admission  Medication Sig Dispense Refill   albuterol  (PROAIR  HFA) 108 (90 Base) MCG/ACT  inhaler USE 2 INHALATIONS EVERY 6 HOURS AS NEEDED FOR WHEEZING OR SHORTNESS OF BREATH (Patient taking differently: 2 puffs every 6 (six) hours as needed for wheezing or shortness of breath.) 18 g 5   aspirin EC 81 MG tablet Take 81 mg by mouth daily. Swallow whole.       atorvastatin  (LIPITOR ) 80 MG tablet TAKE 1 TABLET DAILY 90 tablet 3   cetirizine  (ZYRTEC ) 10 MG tablet Take 1 tablet (10 mg total) by mouth daily as needed for allergies or rhinitis. 90 tablet 1   doxycycline  (VIBRAMYCIN ) 100 MG capsule Take 1 capsule (  100 mg total) by mouth 2 (two) times daily. 20 capsule 0   ezetimibe  (ZETIA ) 10 MG tablet Take 1 tablet (10 mg total) by mouth daily. 90 tablet 3   fenofibrate  micronized (LOFIBRA) 134 MG capsule Take 1 capsule (134 mg total) by mouth daily before breakfast. 90 capsule 1   gabapentin  (NEURONTIN ) 300 MG capsule TAKE 1 CAPSULE TWICE A DAY AND 2 CAPSULES AT BEDTIME 360 capsule 3   glipiZIDE  (GLUCOTROL ) 10 MG tablet Take 0.5 tablets (5 mg total) by mouth 2 (two) times daily before a meal. (Patient taking differently: Take 10 mg by mouth daily before breakfast.) 180 tablet 1   ibuprofen (ADVIL,MOTRIN) 200 MG tablet Take 400 mg by mouth every 8 (eight) hours as needed for mild pain (pain score 1-3).       JANUVIA  100 MG tablet TAKE 1 TABLET DAILY 90 tablet 3   lisinopril  (ZESTRIL ) 20 MG tablet Take 1 tablet (20 mg total) by mouth 2 (two) times daily. 180 tablet 1   LORazepam  (ATIVAN ) 1 MG tablet TAKE 1/2 TABLET BY MOUTH TWICE DAILY AS NEEDED FOR ANXIETY (Patient taking differently: Take 0.5 mg by mouth at bedtime.) 90 tablet 1   Magnesium Oxide 400 MG CAPS Take 1 capsule by mouth daily. Pt is taking 825mg  capsule every other day. (Patient taking differently: Take 1 capsule by mouth daily.)       metFORMIN  (GLUCOPHAGE -XR) 750 MG 24 hr tablet Take 1 tablet (750 mg total) by mouth daily with breakfast.       metoprolol  tartrate (LOPRESSOR ) 50 MG tablet TAKE 1 TABLET TWICE A DAY 180 tablet 3    montelukast  (SINGULAIR ) 10 MG tablet TAKE 1 TABLET AT BEDTIME 90 tablet 3   Potassium Bicarbonate 99 MG CAPS Take 2 capsules by mouth daily.       triamterene -hydrochlorothiazide  (MAXZIDE-25) 37.5-25 MG tablet TAKE 1 TABLET DAILY 90 tablet 3   Vitamin D , Ergocalciferol , (DRISDOL ) 1.25 MG (50000 UNIT) CAPS capsule TAKE 1 CAPSULE EVERY 7 DAYS 12 capsule 3   Blood Glucose Monitoring Suppl (FREESTYLE FREEDOM LITE) w/Device KIT Check blood sugar twice daily 1 each 0   glucose blood test strip Use as instructed 100 each 12   Lancets (FREESTYLE) lancets Check blood sugar twice daily 200 each 5   meloxicam  (MOBIC ) 7.5 MG tablet Take 1-2 tablets (7.5-15 mg total) by mouth daily. (Patient not taking: Reported on 01/04/2025) 40 tablet 1              Home: Home Living Family/patient expects to be discharged to:: Private residence Living Arrangements: Spouse/significant other, Children (5 children all under the age of 77 yo) Available Help at Discharge: Family, Available 24 hours/day Type of Home: House Home Access: Ramped entrance Home Layout: One level Bathroom Shower/Tub: Tub/shower unit, Engineer, Building Services: Handicapped height Bathroom Accessibility: Yes Home Equipment: Cane - single point, Wheelchair - manual Additional Comments: they have 5 children in the home 3yo- 19 yo adopted.  Lives With: Spouse, Family   Functional History: Prior Function Prior Level of Function : Independent/Modified Independent, History of Falls (last six months) Mobility Comments: prior to christmas; 2 falls (one slip in shower, one step off curb); then getting out of car fell backwards   Functional Status:  Mobility: Bed Mobility Overal bed mobility: Needs Assistance Bed Mobility: Sit to Sidelying Rolling: Mod assist Sidelying to sit: +2 for physical assistance, HOB elevated, Max assist Sit to sidelying: Min assist General bed mobility comments: assist to raise legs onto  bed Transfers Overall transfer  level: Needs assistance Equipment used: Rolling walker (2 wheels) Transfers: Sit to/from Stand Sit to Stand: Min assist, +2 physical assistance, Mod assist Bed to/from chair/wheelchair/BSC transfer type:: Step pivot Step pivot transfers: Min assist, +2 physical assistance General transfer comment: cues for hand placement each time;  Sit to stand  from recliner to RW and to sink varied min to mod assist Ambulation/Gait Ambulation/Gait assistance: Min assist, +2 physical assistance, +2 safety/equipment Gait Distance (Feet): 6 Feet (seated rest, 6 ft) Assistive device: Rolling walker (2 wheels) Gait Pattern/deviations: Step-to pattern, Decreased stride length, Decreased dorsiflexion - right, Decreased dorsiflexion - left, Knee hyperextension - right, Knee hyperextension - left, Shuffle, Narrow base of support General Gait Details: steadying assist with initial chair follow; second walk no chair follow needed Gait velocity interpretation: <1.31 ft/sec, indicative of household ambulator Pre-gait activities: lateral wt-shifting x 5 each direction with bil UE support   ADL: ADL Overall ADL's : Needs assistance/impaired Eating/Feeding: Set up, With adaptive utensils Eating/Feeding Details (indicate cue type and reason): patient states she is able to feed self with built up utensils Grooming: Minimal assistance, Bed level Upper Body Bathing: Moderate assistance, Bed level Lower Body Bathing: Total assistance Upper Body Dressing : Moderate assistance Lower Body Dressing: Maximal assistance, Bed level Lower Body Dressing Details (indicate cue type and reason): socks General ADL Comments: pt able to complete self feeding of a muffin this sesison with red foam fork   Cognition: Cognition Orientation Level: Oriented X4 Cognition Arousal: Alert Behavior During Therapy: WFL for tasks assessed/performed   Physical Exam: Blood pressure (!) 150/71, pulse 68, temperature 97.6 F (36.4 C),  temperature source Oral, resp. rate 16, height 5' 9 (1.753 m), weight 124 kg, last menstrual period 10/28/2014, SpO2 98%. Physical Exam Vitals and nursing note reviewed.  Constitutional:      Appearance: She is obese.     Comments: BMI 37.5; awake, alert, appropriate, sitting up in bed; NAD  HENT:     Head: Normocephalic and atraumatic.     Comments: Face red    Right Ear: External ear normal.     Left Ear: External ear normal.     Nose: Nose normal. No congestion.     Mouth/Throat:     Mouth: Mucous membranes are moist.     Pharynx: No oropharyngeal exudate.  Eyes:     General:        Right eye: No discharge.        Left eye: No discharge.     Extraocular Movements: Extraocular movements intact.  Neck:     Comments: Posterior incision covered with original surgical dressing Cardiovascular:     Rate and Rhythm: Normal rate and regular rhythm.     Heart sounds: Normal heart sounds. No murmur heard.    No gallop.  Pulmonary:     Effort: Pulmonary effort is normal. No respiratory distress.     Breath sounds: Normal breath sounds. No wheezing, rhonchi or rales.  Abdominal:     General: There is distension.     Palpations: Abdomen is soft.     Tenderness: There is no abdominal tenderness.     Comments: Soft, but distended vs protuberant- slightly hypoactive, but making some sounds- more than Tuesday when saw her last  Musculoskeletal:     Comments: RUE- biceps 4+/5; WE 4+ to 5-/5; Triceps 4+/5; Grip 4/5; FA 3/5 LUE- biceps 4/5; WE 5-/5; Triceps 4/5; Grip 3+/5; and FA 2/5 RLE- HF 4+/5; KE, DF  4/5; PF 5-/5 and EHL 5-/5 LLE- HF 4-/5; otherwise 4/5 throughout  Skin:    General: Skin is warm and dry.     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 B/L Brisk Hoffman's B/L in Ue's DTR's  reduced to 2+ in Ue's and LE's No clonus at wrist or LE's  Psychiatric:        Behavior: Behavior normal.     Comments: Brighter affect       Lab Results Last 48 Hours        Results for orders placed or performed during the hospital encounter of 01/04/25 (from the past 48 hours)  Glucose, capillary     Status: Abnormal    Collection Time: 01/10/25 11:49 AM  Result Value Ref Range    Glucose-Capillary 345 (H) 70 - 99 mg/dL      Comment: Glucose reference range applies only to samples taken after fasting for at least 8 hours.  Glucose, capillary     Status: Abnormal    Collection Time: 01/10/25  3:41 PM  Result Value Ref Range    Glucose-Capillary 372 (H) 70 - 99 mg/dL      Comment: Glucose reference range applies only to samples taken after fasting for at least 8 hours.  Glucose, capillary     Status: Abnormal    Collection Time: 01/10/25  8:34 PM  Result Value Ref Range    Glucose-Capillary 316 (H) 70 - 99 mg/dL      Comment: Glucose reference range applies only to samples taken after fasting for at least 8 hours.    Comment 1 Notify RN    Glucose, capillary     Status: Abnormal    Collection Time: 01/11/25 12:17 AM  Result Value Ref Range    Glucose-Capillary 283 (H) 70 - 99 mg/dL      Comment: Glucose reference range applies only to samples taken after fasting for at least 8 hours.    Comment 1 Notify RN    CBC with Differential/Platelet     Status: Abnormal    Collection Time: 01/11/25  1:50 AM  Result Value Ref Range    WBC 10.7 (H) 4.0 - 10.5 K/uL    RBC 3.76 (L) 3.87 - 5.11 MIL/uL    Hemoglobin 11.6 (L) 12.0 - 15.0 g/dL    HCT 65.1 (L) 63.9 - 46.0 %    MCV 92.6 80.0 - 100.0 fL    MCH 30.9 26.0 - 34.0 pg    MCHC 33.3 30.0 - 36.0 g/dL    RDW 87.9 88.4 - 84.4 %    Platelets 272 150 - 400 K/uL    nRBC 0.0 0.0 - 0.2 %    Neutrophils Relative % 80 %    Neutro Abs 8.6 (H) 1.7 - 7.7 K/uL    Lymphocytes Relative 12 %    Lymphs Abs 1.3 0.7 - 4.0 K/uL    Monocytes  Relative 7 %    Monocytes Absolute 0.7 0.1 - 1.0 K/uL    Eosinophils Relative 0 %    Eosinophils Absolute 0.0 0.0 - 0.5 K/uL    Basophils Relative 0 %    Basophils Absolute 0.0 0.0 - 0.1 K/uL    Immature Granulocytes 1 %    Abs Immature Granulocytes 0.05 0.00 -  0.07 K/uL      Comment: Performed at [facility], [address], [place], [place] [address]  Comprehensive metabolic panel with GFR     Status: Abnormal    Collection Time: 01/11/25  1:50 AM  Result Value Ref Range    Sodium 134 (L) 135 - 145 mmol/L    Potassium 5.0 3.5 - 5.1 mmol/L    Chloride 101 98 - 111 mmol/L    CO2 23 22 - 32 mmol/L    Glucose, Bld 281 (H) 70 - 99 mg/dL      Comment: Glucose reference range applies only to samples taken after fasting for at least 8 hours.    BUN 35 (H) 6 - 20 mg/dL    Creatinine, Ser 9.06 0.44 - 1.00 mg/dL    Calcium  8.7 (L) 8.9 - 10.3 mg/dL    Total Protein 6.0 (L) 6.5 - 8.1 g/dL    Albumin 3.4 (L) 3.5 - 5.0 g/dL    AST 18 15 - 41 U/L    ALT 13 0 - 44 U/L    Alkaline Phosphatase 74 38 - 126 U/L    Total Bilirubin 0.5 0.0 - 1.2 mg/dL    GFR, Estimated >39 >39 mL/min      Comment: (NOTE) Calculated using the CKD-EPI Creatinine Equation (2021)      Anion gap 9 5 - 15      Comment: Performed at [facility], [address] [place], [place] [address]  Magnesium     Status: None    Collection Time: 01/11/25  1:50 AM  Result Value Ref Range    Magnesium 1.8 1.7 - 2.4 mg/dL      Comment: Performed at [facility], [address], [place], [place] [address]  Phosphorus     Status: None    Collection Time: 01/11/25  1:50 AM  Result Value Ref Range    Phosphorus 2.8 2.5 - 4.6 mg/dL      Comment: Performed at [facility], [address] [place], [place] [address]  Glucose, capillary     Status: Abnormal    Collection Time: 01/11/25  4:41 AM  Result Value Ref Range    Glucose-Capillary 259 (H) 70 - 99 mg/dL      Comment: Glucose reference range applies only  to samples taken after fasting for at least 8 hours.    Comment 1 Notify RN    Glucose, capillary     Status: Abnormal    Collection Time: 01/11/25  6:38 AM  Result Value Ref Range    Glucose-Capillary 247 (H) 70 - 99 mg/dL      Comment: Glucose reference range applies only to samples taken after fasting for at least 8 hours.  Glucose, capillary     Status: Abnormal    Collection Time: 01/11/25 12:35 PM  Result Value Ref Range    Glucose-Capillary 279 (H) 70 - 99 mg/dL      Comment: Glucose reference range applies only to samples taken after fasting for at least 8 hours.  Glucose, capillary     Status: Abnormal    Collection Time: 01/11/25  5:37 PM  Result Value Ref Range    Glucose-Capillary 267 (H) 70 - 99 mg/dL      Comment: Glucose reference range applies only to samples taken after fasting for at least 8 hours.  Glucose, capillary     Status: Abnormal    Collection Time: 01/11/25  9:50 PM  Result Value Ref Range    Glucose-Capillary 237 (H) 70 - 99 mg/dL      Comment: Glucose reference range applies only to samples taken after fasting for at least 8 hours.    Comment 1 Notify RN      Comment 2 Document in Chart    Glucose, capillary     Status: Abnormal    Collection Time: 01/12/25  6:42 AM  Result Value Ref Range    Glucose-Capillary 195 (H) 70 - 99 mg/dL      Comment: Glucose reference range applies only to samples taken after fasting for at least 8 hours.    Comment 1 Notify RN      Comment 2 Document in Chart        Imaging Results (Last 48 hours)  No results found.         Blood pressure (!) 150/71, pulse 68, temperature 97.6 F (36.4 C), temperature source Oral, resp. rate 16, height 5' 9 (1.753 m), weight 124 kg, last menstrual period 10/28/2014, SpO2 98%.   Medical Problem List and Plan: 1. 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  Antithrombotics: -DVT/anticoagulation:  Mechanical:  Antiembolism stockings, knee (TED hose) Bilateral lower extremities             -antiplatelet therapy: per Dr. Joshua wait at least 5 days for DVT prophylaxis.              -Vas US -pending  3. Pain Management: Baclofen  5 mg BID. Oxycodone  and robaxin  PRN.  4. Mood/Behavior/Sleep: LCSW to follow for evaluation and support when available.              -antipsychotic agents: Ativan  0.5 mg bid prn  5. Neuropsych/cognition: This patient is capable of making decisions on her own behalf. 6. Skin/Wound Care: Routine pressure relief measures. Monitor incision sites for signs of infection. Hemo Vac removed 1/15.  7. Fluids/Electrolytes/Nutrition: Monitor I&O and weight. Follow up labs CBC/CMP                -Carb modified diet with supplements to continue.  8. Cervical spinal stenosis: s/p laminectomy by Dr. Joshua 1/12. NSGRY following, continue on decadron  taper.  9. DM2: A1c 7.1. Holding home metformin , glipizide  and januvia .   -Hyperglycemia in the setting of steroids. Monitor glucose ac/hs with SSI Novolog , Lantus  30 units.  10.CKD IIIa: Stable, baseline range 1.1-1.5. Monitor BMP.  11. Acute bronchitis: CT from 1/1 without acute intrathoracic pathology. Completed 10 day course of doxycycline  1/15.               -encourage ins spiro.  12. HLD: Lipitor  80 mg  13. HTN: Holding metoprolol , lisinopril , triamterene -HCTZ. Monitor bp per protocol for need to resume.  Likely has orthostatic hypotension that's common for cervical injuries.  14. Neurogenic bladder: Foley removed on 1/14  monitor PVRs d/t potential for urinary retention.  15. Neurogenic bowel: Continue Senna bid.              Will give Sorbitol  45 cc x1 this afternoon- if no BM today, will  give 60cc tomorrow and SSE.           Daphne LOISE Satterfield, NP 01/12/2025      I have personally performed a face to face diagnostic evaluation of this patient and formulated the key components of the plan.  Additionally, I have personally reviewed laboratory data, imaging studies, as well as relevant notes and concur with the physician assistant's documentation above.   The patient's status has not changed from the original H&P.  Any changes in documentation from the acute care chart have been noted above.    I spent a total of  84  minutes on total care today- >50% coordination of care- due to  D/w pt about update prognosis based on her new ASIA exam results today- and what this means for her prognosis- as well as less time in rehab- went over neurogenic bowel and bladder- and probably still has mild Sx's of these dx's.  Also discussed CIR stay and plans for her.      "

## 2025-01-12 NOTE — Progress Notes (Signed)
 Inpatient Rehabilitation Admission Medication Review by a Pharmacist  A complete drug regimen review was completed for this patient to identify any potential clinically significant medication issues.  High Risk Drug Classes Is patient taking? Indication by Medication  Antipsychotic No   Anticoagulant No   Antibiotic Yes PO doxycycline  - 1 dose, cellulitis  Opioid Yes Oxycodone  prn pain  Antiplatelet No   Hypoglycemics/insulin  Yes Insulin  - DM  Vasoactive Medication No   Chemotherapy No   Other Yes Trazodone  prn sleep Dexamethasone -cervical spinal stenosis  Methocarbamol , baclofen - muscle spasms Atorvastatin  - HLD Lorazepam  prn anxiety     Type of Medication Issue Identified Description of Issue Recommendation(s)  Drug Interaction(s) (clinically significant)     Duplicate Therapy     Allergy     No Medication Administration End Date     Incorrect Dose     Additional Drug Therapy Needed     Significant med changes from prior encounter (inform family/care partners about these prior to discharge).    Other       Clinically significant medication issues were identified that warrant physician communication and completion of prescribed/recommended actions by midnight of the next day:  No  Name of provider notified for urgent issues identified:   Provider Method of Notification:     Pharmacist comments: None  Time spent performing this drug regimen review (minutes):  20 minutes  Thank you. Olam Monte, PharmD

## 2025-01-12 NOTE — Plan of Care (Signed)
   Problem: Activity: Goal: Risk for activity intolerance will decrease Outcome: Progressing

## 2025-01-12 NOTE — Progress Notes (Signed)
 Physical Therapy Treatment Patient Details Name: April Warren MRN: [id number] DOB: 1971-07-12 Today's Date: 01/12/2025   History of Present Illness 54 year old female presented to ED 01/04/25 s/p falls and increasing weakness x 2 days.  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 1/12 PLA C2-3 C3-4 C4-5 C5-6 C6-7 segmental lateral mass fixation C2-C7 utilizing ATEC lateral mass screws PMH significant for diabetic retinopathy, HTN    PT Comments  Patient reported up in chair x 2 hours and ready to lie down. Patient progressed to standing with RW with +2 min-mod assist (varied over multiple transfers). Progressed to ambulation x 6 ft with RW (twice with seated rest between). Patient unsteady with knees hyperextended for support/control, poor foot clearance bil, and narrow BOS. Able to stand at sink for bi-manual task with +2 CGA with pelvis supported against counter. Patient highly motivated and will do well with post-acute therapies >3 hrs/day.     If plan is discharge home, recommend the following: Two people to help with walking and/or transfers;Assistance with cooking/housework;Assistance with feeding;Assist for transportation;Help with stairs or ramp for entrance   Can travel by private vehicle        Equipment Recommendations  BSC/3in1;Hospital bed    Recommendations for Other Services       Precautions / Restrictions Precautions Precautions: Fall Recall of Precautions/Restrictions: Intact Precaution/Restrictions Comments: cervical Restrictions Weight Bearing Restrictions Per Provider Order: No     Mobility  Bed Mobility Overal bed mobility: Needs Assistance Bed Mobility: Sit to Sidelying         Sit to sidelying: Min assist General bed mobility comments: assist to raise legs onto bed    Transfers Overall transfer level: Needs assistance Equipment used: Rolling walker (2  wheels) Transfers: Sit to/from Stand Sit to Stand: Min assist, +2 physical assistance, Mod assist           General transfer comment: cues for hand placement each time;  Sit to stand  from recliner to RW and to sink varied min to mod assist    Ambulation/Gait Ambulation/Gait assistance: Min assist, +2 physical assistance, +2 safety/equipment Gait Distance (Feet): 6 Feet (seated rest, 6 ft) Assistive device: Rolling walker (2 wheels) Gait Pattern/deviations: Step-to pattern, Decreased stride length, Decreased dorsiflexion - right, Decreased dorsiflexion - left, Knee hyperextension - right, Knee hyperextension - left, Shuffle, Narrow base of support   Gait velocity interpretation: <1.31 ft/sec, indicative of household ambulator   General Gait Details: steadying assist with initial chair follow; second walk no chair follow needed   Stairs             Wheelchair Mobility     Tilt Bed    Modified Rankin (Stroke Patients Only)       Balance Overall balance assessment: Needs assistance Sitting-balance support: Feet supported Sitting balance-Leahy Scale: Fair     Standing balance support: During functional activity, No upper extremity supported Standing balance-Leahy Scale: Poor Standing balance comment: bimanual tasks at sink with pelvis against counter; stood for several minutes                            Communication Communication Communication: No apparent difficulties  Cognition Arousal: Alert Behavior During Therapy: WFL for tasks assessed/performed   PT - Cognitive impairments: No apparent impairments  Following commands: Intact      Cueing Cueing Techniques: Verbal cues  Exercises      General Comments General comments (skin integrity, edema, etc.): Husband present. Ace wraps on bil LEs still in place from 1/14 and removed at end of session. RN informed TED hose now in room      Pertinent Vitals/Pain  Pain Assessment Pain Assessment: 0-10 Pain Score: 8  Pain Location: R shoulder>neck Pain Descriptors / Indicators: Discomfort, Grimacing, Guarding, Moaning Pain Intervention(s): Limited activity within patient's tolerance, Monitored during session, Patient requesting pain meds-RN notified, RN gave pain meds during session    Home Living                          Prior Function            PT Goals (current goals can now be found in the care plan section) Acute Rehab PT Goals Patient Stated Goal: go home ASAP Time For Goal Achievement: 01/24/25 Potential to Achieve Goals: Good Progress towards PT goals: Progressing toward goals    Frequency    Min 3X/week      PT Plan      Co-evaluation PT/OT/SLP Co-Evaluation/Treatment: Yes Reason for Co-Treatment: Complexity of the patient's impairments (multi-system involvement);For patient/therapist safety;To address functional/ADL transfers PT goals addressed during session: Mobility/safety with mobility;Balance        AM-PAC PT 6 Clicks Mobility   Outcome Measure  Help needed turning from your back to your side while in a flat bed without using bedrails?: A Lot Help needed moving from lying on your back to sitting on the side of a flat bed without using bedrails?: Total Help needed moving to and from a bed to a chair (including a wheelchair)?: Total Help needed standing up from a chair using your arms (e.g., wheelchair or bedside chair)?: Total Help needed to walk in hospital room?: Total Help needed climbing 3-5 steps with a railing? : Total 6 Click Score: 7    End of Session Equipment Utilized During Treatment: Gait belt Activity Tolerance: Patient tolerated treatment well Patient left: with call bell/phone within reach;with family/visitor present;in bed;with bed alarm set Nurse Communication: Mobility status PT Visit Diagnosis: Repeated falls (R29.6);Muscle weakness (generalized) (M62.81);Difficulty in walking,  not elsewhere classified (R26.2)     Time: 9065-9041 PT Time Calculation (min) (ACUTE ONLY): 24 min  Charges:    $Gait Training: 8-22 mins PT General Charges $$ ACUTE PT VISIT: 1 Visit                      April Warren, PT Acute Rehabilitation Services  Office [phone]    April Warren 01/12/2025, 10:17 AM

## 2025-01-12 NOTE — Discharge Summary (Signed)
 Physician Discharge Summary  April Warren FMW:[id number] DOB: 1971-09-20 DOA: 01/04/2025  PCP: [name] LABOR, MD  Admit date: 01/04/2025 Discharge date: 01/12/2025  Admitted From: Home Disposition: CIR  Recommendations for Outpatient Follow-up:  Follow up with PCP in 1-2 weeks Please obtain BMP/CBC in one week your next doctors visit.  Further recommendations per neurosurgery Also monitor blood glucose   Discharge Condition: Stable CODE STATUS: Full code Diet recommendation: Diabetic  Brief/Interim Summary: Brief Narrative:   54 year old with history of HTN, DM2, CKD admitted for worsening fall and weakness.  Found to have spinal canal stenosis with cervical cord compression.  Underwent decompressive cervical laminectomy by neurosurgery.  PT now recommending CIR.  Assessment & Plan:  Cervical spinal stenosis causing left-sided weakness and fall - MRI confirmed severe cervical spinal stenosis with cord compression therefore underwent decompressive laminectomy by neurosurgery on 1/12.  Planning on CIR placement once cleared by neurosurgery.  Will prescribe her prednisone taper for now.  Neurosurgery can extended during follow-up  Diabetes mellitus type 2 Hyperglycemia, steroid-induced - On p.o. medications.  Continue sliding scale  Essential hypertension - IV as needed  CKD 2 -Overall creatinine stable around 1.0   DVT prophylaxis: Place TED hose Start: 01/10/25 1151 SCD's Start: 01/09/25 1746      Code Status: Full Code Family Communication: Spouse at bedside Status is: Inpatient Remains inpatient appropriate because: CIR placement PT Follow up Recs: Acute Inpatient Rehab (3hours/Day)01/11/2025 9066  Subjective: Seen at bedside overall feels little better No new complaints today   Examination:  General exam: Appears calm and comfortable  Respiratory system: Clear to auscultation. Respiratory effort normal. Cardiovascular system: S1 & S2 heard, RRR. No JVD,  murmurs, rubs, gallops or clicks. No pedal edema. Gastrointestinal system: Abdomen is nondistended, soft and nontender. No organomegaly or masses felt. Normal bowel sounds heard. Central nervous system: Alert and oriented. No focal neurological deficits. Extremities: Symmetric 5 x 5 power. Skin: No rashes, lesions or ulcers Psychiatry: Judgement and insight appear normal. Mood & affect appropriate.    Discharge Diagnoses:  Principal Problem:   Left-sided weakness Active Problems:   Type 2 diabetes mellitus in patient with obesity (HCC)   HTN (hypertension)   Fall   S/P cervical spinal fusion      Discharge Exam: Vitals:   01/12/25 0327 01/12/25 0822  BP:  (!) 150/71  Pulse: (!) 56 68  Resp:  16  Temp: (!) 97.5 F (36.4 C) 97.6 F (36.4 C)  SpO2: 97% 98%   Vitals:   01/11/25 2011 01/12/25 0012 01/12/25 0327 01/12/25 0822  BP: (!) 154/70 (!) 150/68  (!) 150/71  Pulse: (!) 58 62 (!) 56 68  Resp:  14  16  Temp: 99.1 F (37.3 C) 98.4 F (36.9 C) (!) 97.5 F (36.4 C) 97.6 F (36.4 C)  TempSrc: Oral Oral Oral Oral  SpO2: 96% 100% 97% 98%  Weight:      Height:          Discharge Instructions  Discharge Instructions     Ambulatory referral to Physical Medicine Rehab   Complete by: As directed       Allergies as of 01/12/2025       Reactions   Shellfish Allergy Other (See Comments)   Tongue and Throat tingling and itching. No swelling or SOB        Medication List     STOP taking these medications    doxycycline  100 MG capsule Commonly known as: VIBRAMYCIN    ibuprofen 200  MG tablet Commonly known as: ADVIL   meloxicam  7.5 MG tablet Commonly known as: MOBIC        TAKE these medications    aspirin EC 81 MG tablet Take 81 mg by mouth daily. Swallow whole.   atorvastatin  80 MG tablet Commonly known as: LIPITOR  TAKE 1 TABLET DAILY   cetirizine  10 MG tablet Commonly known as: ZYRTEC  Take 1 tablet (10 mg total) by mouth daily as needed for  allergies or rhinitis.   dexamethasone  1 MG tablet Commonly known as: DECADRON  Take 2 tablets (2 mg total) by mouth 3 (three) times daily for 2 days, THEN 2 tablets (2 mg total) 2 (two) times daily for 2 days, THEN 2 tablets (2 mg total) daily for 2 days. Start taking on: January 12, 2025   ezetimibe  10 MG tablet Commonly known as: ZETIA  Take 1 tablet (10 mg total) by mouth daily.   fenofibrate  micronized 134 MG capsule Commonly known as: LOFIBRA Take 1 capsule (134 mg total) by mouth daily before breakfast.   FreeStyle Freedom Lite w/Device Kit Check blood sugar twice daily   freestyle lancets Check blood sugar twice daily   gabapentin  300 MG capsule Commonly known as: NEURONTIN  TAKE 1 CAPSULE TWICE A DAY AND 2 CAPSULES AT BEDTIME   glipiZIDE  10 MG tablet Commonly known as: GLUCOTROL  Take 0.5 tablets (5 mg total) by mouth 2 (two) times daily before a meal. What changed:  how much to take when to take this   glucose blood test strip Use as instructed   Januvia  100 MG tablet Generic drug: sitaGLIPtin  TAKE 1 TABLET DAILY   lisinopril  20 MG tablet Commonly known as: ZESTRIL  Take 1 tablet (20 mg total) by mouth 2 (two) times daily.   LORazepam  1 MG tablet Commonly known as: ATIVAN  TAKE 1/2 TABLET BY MOUTH TWICE DAILY AS NEEDED FOR ANXIETY What changed: See the new instructions.   Magnesium Oxide 400 MG Caps Take 1 capsule by mouth daily. Pt is taking 825mg  capsule every other day. What changed: additional instructions   metFORMIN  750 MG 24 hr tablet Commonly known as: GLUCOPHAGE -XR Take 1 tablet (750 mg total) by mouth daily with breakfast.   metoprolol  tartrate 50 MG tablet Commonly known as: LOPRESSOR  TAKE 1 TABLET TWICE A DAY   montelukast  10 MG tablet Commonly known as: SINGULAIR  TAKE 1 TABLET AT BEDTIME   Potassium Bicarbonate 99 MG Caps Take 2 capsules by mouth daily.   ProAir  HFA 108 (90 Base) MCG/ACT inhaler Generic drug: albuterol  USE 2  INHALATIONS EVERY 6 HOURS AS NEEDED FOR WHEEZING OR SHORTNESS OF BREATH What changed: See the new instructions.   triamterene -hydrochlorothiazide  37.5-25 MG tablet Commonly known as: MAXZIDE-25 TAKE 1 TABLET DAILY   Vitamin D  (Ergocalciferol ) 1.25 MG (50000 UNIT) Caps capsule Commonly known as: DRISDOL  TAKE 1 CAPSULE EVERY 7 DAYS        Follow-up Information     Joshua Alm Hamilton, MD Follow up in 1 week(s).   Specialty: Neurosurgery Contact information: [address] [place] [address] [phone]                Allergies[1]  You were cared for by a hospitalist during your hospital stay. If you have any questions about your discharge medications or the care you received while you were in the hospital after you are discharged, you can call the unit and asked to speak with the hospitalist on call if the hospitalist that took care of you is not  available. Once you are discharged, your primary care physician will handle any further medical issues. Please note that no refills for any discharge medications will be authorized once you are discharged, as it is imperative that you return to your primary care physician (or establish a relationship with a primary care physician if you do not have one) for your aftercare needs so that they can reassess your need for medications and monitor your lab values.  You were cared for by a hospitalist during your hospital stay. If you have any questions about your discharge medications or the care you received while you were in the hospital after you are discharged, you can call the unit and asked to speak with the hospitalist on call if the hospitalist that took care of you is not available. Once you are discharged, your primary care physician will handle any further medical issues. Please note that NO REFILLS for any discharge medications will be authorized once you are discharged, as it is imperative that you return to your  primary care physician (or establish a relationship with a primary care physician if you do not have one) for your aftercare needs so that they can reassess your need for medications and monitor your lab values.  Please request your Prim.MD to go over all Hospital Tests and Procedure/Radiological results at the follow up, please get all Hospital records sent to your Prim MD by signing hospital release before you go home.  Get CBC, CMP, 2 view Chest X ray checked  by Primary MD during your next visit or SNF MD in 5-7 days ( we routinely change or add medications that can affect your baseline labs and fluid status, therefore we recommend that you get the mentioned basic workup next visit with your PCP, your PCP may decide not to get them or add new tests based on their clinical decision)  On your next visit with your primary care physician please Get Medicines reviewed and adjusted.  If you experience worsening of your admission symptoms, develop shortness of breath, life threatening emergency, suicidal or homicidal thoughts you must seek medical attention immediately by calling 911 or calling your MD immediately  if symptoms less severe.  You Must read complete instructions/literature along with all the possible adverse reactions/side effects for all the Medicines you take and that have been prescribed to you. Take any new Medicines after you have completely understood and accpet all the possible adverse reactions/side effects.   Do not drive, operate heavy machinery, perform activities at heights, swimming or participation in water activities or provide baby sitting services if your were admitted for syncope or siezures until you have seen by Primary MD or a Neurologist and advised to do so again.  Do not drive when taking Pain medications.   Procedures/Studies: DG Cervical Spine 2 or 3 views Result Date: 01/10/2025 EXAM: FLUOROSCOPIC IMAGING TECHNIQUE: Fluoroscopy was provided by the radiology  department for procedure. Radiologist was not present during examination. RADIATION DOSE INDEX: Fluoro time: 23.5 seconds. Reference Air Kerma: 5.22 mGy. COMPARISON: MRI cervical spine dated 04/15/2023. CT cervical spine dated 01/02/2025. CLINICAL HISTORY: [id number] Surgery, elective [id number]. FINDINGS: Intraoperative fluoroscopic imaging was performed. Intraoperative placement of posterior cervical fusion and foraminotomy at the C2 to C7 levels. A total of 4 intraoperative low-resolution images of the cervical spine were obtained. IMPRESSION: 1. Intraoperative fluoroscopic imaging as above. Please refer to the operative report for full details. Electronically signed by: Morgane Naveau MD MD 01/10/2025 12:46 AM EST RP Workstation: [id number]  DG C-Arm 1-60 Min-No Report Result Date: 01/09/2025 Fluoroscopy was utilized by the requesting physician.  No radiographic interpretation.   DG C-Arm 1-60 Min-No Report Result Date: 01/09/2025 Fluoroscopy was utilized by the requesting physician.  No radiographic interpretation.   DG C-Arm 1-60 Min-No Report Result Date: 01/09/2025 Fluoroscopy was utilized by the requesting physician.  No radiographic interpretation.   MR THORACIC SPINE WO CONTRAST Result Date: 01/04/2025 EXAM: MRI THORACIC SPINE WITHOUT INTRAVENOUS CONTRAST 01/04/2025 06:29:13 PM TECHNIQUE: Multiplanar multisequence MRI of the thoracic spine was performed without the administration of intravenous contrast. COMPARISON: MRI of the cervical spine and CT of the cervical spine on 01/02/2025. CLINICAL HISTORY: Mid-back pain. FINDINGS: BONES AND ALIGNMENT: Normal alignment. Normal vertebral body heights. Chronic appearing deformity of the T1 superior endplate. Additional chronic deformities of the T5 endplates. Mild bone marrow edema in the anterior and inferior T2 vertebral body which may reflect a nondisplaced fracture versus degenerative endplate changes. SPINAL CORD: Normal spinal cord volume. Normal  spinal cord signal. Mild flattening of the ventral thoracic cord at T2-T3 without cord signal abnormality. Subtle flattening of the right ventral cord at T8-T9. SOFT TISSUES: Unremarkable. DEGENERATIVE CHANGES: Chronic changes of the disc noted at T2. Prominent disc bulge at T2-T3 eccentric to the right which indents the ventral thecal sac with mild flattening of the ventral thoracic cord without cord signal abnormality. Mild spinal canal stenosis at T2-T3. Disc bulge at T8-T9 which indents the ventral thecal sac with subtle flattening of the right ventral cord. Additional shallow disc bulges at T9-T10 and T10-T11 which indent the ventral thecal sac without significant spinal canal stenosis. Facet arthrosis at multiple levels throughout the thoracic spine. Additional degenerative changes at multiple costovertebral articulations. Moderate foraminal stenosis on the right at T2-T3. Moderate foraminal stenosis on the right at T4-T5. Moderate foraminal stenosis bilaterally at T5-T6. Moderate foraminal stenosis on the left at T6-T7. Moderate right and severe left foraminal stenosis at T7-T8 primarily due to facet arthrosis. Moderate foraminal stenosis on the left at T9-T10. Moderate bilateral foraminal stenosis at T10-T11 and T11-T12. IMPRESSION: 1. Mild bone marrow edema in the anterior and inferior T2 vertebral body, possibly reflecting a nondisplaced fracture versus degenerative endplate changes. 2. Prominent disc bulge at T2-T3 eccentric to the right, causing mild spinal canal stenosis and mild flattening of the ventral thoracic cord without cord signal abnormality. 3. Degenerative changes as above. Significant foraminal stenosis at multiple levels, greatest and severe on the left at T7-8. Electronically signed by: Donnice Mania MD MD 01/04/2025 08:00 PM EST RP Workstation: [id number]   MR BRAIN WO CONTRAST Result Date: 01/04/2025 EXAM: MRI BRAIN WITHOUT CONTRAST 01/04/2025 06:29:13 PM TECHNIQUE: Multiplanar  multisequence MRI of the head/brain was performed without the administration of intravenous contrast. COMPARISON: CT head 01/02/2025 CLINICAL HISTORY: Neuro deficit, acute, stroke suspected; subacute FINDINGS: BRAIN AND VENTRICLES: No acute infarct. No intracranial hemorrhage. No mass. No midline shift. No hydrocephalus. The sella is unremarkable. Normal flow voids. ORBITS: No significant abnormality. SINUSES AND MASTOIDS: No significant abnormality. BONES AND SOFT TISSUES: Normal marrow signal. No significant soft tissue abnormality. IMPRESSION: 1. No significant abnormality. Electronically signed by: Gilmore Molt MD 01/04/2025 07:53 PM EST RP Workstation: [id number]   MR Cervical Spine Wo Contrast Result Date: 01/04/2025 EXAM: MRI CERVICAL SPINE WITHOUT CONTRAST 01/04/2025 06:29:13 PM TECHNIQUE: Multiplanar multisequence MRI of the cervical spine was performed. COMPARISON: CT cervical spine 01/02/2025. CLINICAL HISTORY: Neck pain, acute, no red flags. FINDINGS: BONES AND ALIGNMENT: Normal alignment. Normal vertebral body heights. Bone marrow  signal is unremarkable. No bone marrow edema or evidence of fracture. Degenerative endplate changes. Redemonstrated prominent ossification of the posterior longitudinal ligament. No evidence of acute ligamentous injury in the cervical spine. SPINAL CORD: Normal spinal cord size. No abnormal spinal cord signal at C2-C3. Possible subtle cord signal abnormality favored to reflect myelomalacia at C3-C4, C4-C5, and C5-C6. Subtle flattening of the ventral cord at C6-C7. SOFT TISSUES: No paraspinal mass. C2-C3: Prominent central disc protrusion which contacts the ventral cord with mild flattening of the cord. Thickening of the ligamentum flavum. Moderate spinal canal stenosis. No cord signal abnormality. Bilateral facet arthrosis. Mild foraminal stenosis on the left. 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 Thickening of the ligamentum flavum. Severe spinal canal stenosis and cord compression. Possible subtle cord signal abnormality favored to reflect myelomalacia. Bilateral facet arthrosis and uncovertebral hypertrophy. Mild right and moderate left foraminal stenosis. 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 Thickening of the ligamentum flavum. Severe spinal canal stenosis and cord compression noted. Subtle cord signal abnormality favored to reflect myelomalacia. Bilateral facet arthrosis and uncovertebral hypertrophy. Moderate right and severe left foraminal stenosis. 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 Thickening of the ligamentum flavum. Moderate to severe spinal canal stenosis with cord compression at this level. Possible cord signal abnormality which is favoring myelomalacia. Bilateral facet arthrosis and uncovertebral hypertrophy. Moderate bilateral foraminal stenosis. C6-C7: Disc bulge eccentric to the right which indents the ventral thecal sac with subtle flattening of the ventral cord. Thickening of the ligamentum flavum. Mild spinal canal stenosis. Bilateral facet arthrosis. Mild foraminal stenosis on the left. C7-T1: Small disc bulge. No significant spinal canal stenosis. Bilateral facet arthrosis. Mild foraminal stenosis on the left. IMPRESSION: 1. Severe spinal canal stenosis and cord compression at C3-4 and C4-5 with possible subtle cord signal abnormality favored to reflect myelomalacia. 2. Moderate to severe spinal canal stenosis with cord compression at C5-6 with possible cord signal abnormality favoring myelomalacia. 3. Moderate spinal canal stenosis at C2-3 without cord signal abnormality. 4. 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 5. Significant foraminal stenosis at multiple levels as above. 6. No findings to suggest acute traumatic injury. Electronically signed by: Donnice Mania MD MD 01/04/2025 07:49 PM EST RP Workstation: [id number]   DG Knee Complete 4 Views Left Result Date: 01/02/2025 EXAM: 4 VIEW(S) XRAY OF THE LEFT KNEE 01/02/2025 02:40:00 PM COMPARISON: None available. CLINICAL HISTORY: fall FINDINGS: BONES AND JOINTS: No acute fracture. No malalignment. No joint effusion. Enthesophytes at the quadriceps tendon insertion and patellar tendon origin/insertion. Mild patellofemoral osteoarthritis. SOFT TISSUES: The soft tissues are unremarkable. IMPRESSION: 1. No evidence of acute traumatic injury. Electronically signed by: Rockey Kilts MD 01/02/2025 03:28 PM EST RP Workstation: [id number]   CT Cervical Spine Wo Contrast Result Date: 01/02/2025 EXAM: CT CERVICAL SPINE WITHOUT CONTRAST 01/02/2025 02:54:49 PM TECHNIQUE: CT of the cervical spine was performed without the administration of intravenous contrast. Multiplanar reformatted images are provided for review. Automated exposure control, iterative reconstruction, and/or weight based adjustment of the mA/kV was utilized to reduce the radiation dose to as low as reasonably achievable. COMPARISON: None available. CLINICAL HISTORY: Polytrauma, blunt. Slipped backwards going up a ramp and fell, hitting the head, neck, and left shoulder with pain to these areas. FINDINGS: BONES AND ALIGNMENT:  No acute fracture or traumatic malalignment. 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 Broad posterior disc osteophyte complexes and/or ossification of the posterior longitudinal ligament at T2-T3 resulting in moderate spinal stenosis. SOFT TISSUES: No prevertebral soft tissue swelling. IMPRESSION: 1. No acute cervical spine fracture or  traumatic malalignment. 2. 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 Electronically signed by: Dasie Hamburg MD 01/02/2025 03:25 PM EST RP Workstation: [id number]   CT Head Wo Contrast Result Date: 01/02/2025 EXAM: CT HEAD WITHOUT CONTRAST 01/02/2025 02:54:49 PM TECHNIQUE: CT of the head was performed without the administration of intravenous contrast. Automated exposure control, iterative reconstruction, and/or weight based adjustment of the mA/kV was utilized to reduce the radiation dose to as low as reasonably achievable. COMPARISON: None available. CLINICAL HISTORY: Polytrauma, blunt. Slipped backwards going up a ramp and fell, hitting the head, neck, and left shoulder with pain to these areas. FINDINGS: BRAIN AND VENTRICLES: There is no evidence of an acute infarct, intracranial hemorrhage, mass, midline shift, hydrocephalus, or extra-axial fluid collection. Cerebral volume is normal. Calcified atherosclerosis at the skull base. ORBITS: No acute abnormality. SINUSES: Mild mucosal thickening in the ethmoid sinuses. Clear mastoid air cells. SOFT TISSUES AND SKULL: No acute soft tissue abnormality. No skull fracture. IMPRESSION: 1. No acute intracranial abnormality. Electronically signed by: Dasie Hamburg MD 01/02/2025 03:19 PM EST RP Workstation: [id number]   CT Angio Chest PE W and/or Wo Contrast Result Date: 12/29/2024 CLINICAL DATA:  Cough.  Concern for pulmonary embolism. EXAM: CT ANGIOGRAPHY CHEST WITH CONTRAST TECHNIQUE: Multidetector CT imaging of the chest was performed using the standard protocol during bolus administration of intravenous contrast. Multiplanar CT image reconstructions and MIPs were obtained to evaluate the vascular anatomy. RADIATION DOSE REDUCTION: This exam was performed according to the departmental dose-optimization program which includes automated exposure control, adjustment of the mA and/or kV according to patient size and/or use of  iterative reconstruction technique. CONTRAST:  OMNIPAQUE  IOHEXOL  350 MG/ML SOLN COMPARISON:  Chest radiograph dated 12/29/2024. FINDINGS: Evaluation of this exam is limited due to respiratory motion. Cardiovascular: There is no cardiomegaly or pericardial effusion. The thoracic aorta is unremarkable evaluation of the pulmonary arteries is limited due to respiratory motion. No central pulmonary artery embolus identified. Mediastinum/Nodes: No hilar or mediastinal adenopathy. The esophagus is grossly unremarkable. No mediastinal fluid collection. Lungs/Pleura: There is a 2.5 x 2.0 cm nodule in the left lower lobe demonstrating a lobulated margin. This nodule is not characterized on this CT and although may represent a benign nodule such as a pulmonary hamartoma, a malignancy is not excluded. No focal consolidation, pleural effusion, or pneumothorax. The central airways are patent. Upper Abdomen: No acute abnormality. Musculoskeletal: Osteopenia with degenerative changes of the spine. No acute osseous pathology. Review of the MIP images confirms the above findings. IMPRESSION: 1. No acute intrathoracic pathology. No CT evidence of central pulmonary artery embolus. 2. A 2.5 x 2.0 cm left lower lobe nodule. Multidisciplinary consult and further evaluation with PET-CT is recommended. Electronically Signed   By: Vanetta Chou M.D.   On: 12/29/2024 16:17   DG Chest 2 View Result Date: 12/29/2024 EXAM: 2 VIEW(S) XRAY OF THE CHEST 12/29/2024 01:41:16 PM COMPARISON: None available. CLINICAL HISTORY: SOB FINDINGS: LUNGS AND PLEURA: 2.5 cm retrocardiac nodular density.  No pleural effusion. No pneumothorax. HEART AND MEDIASTINUM: No acute abnormality of the cardiac and mediastinal  silhouettes. BONES AND SOFT TISSUES: Thoracic degenerative changes. No acute osseous abnormality. IMPRESSION: 1. 2.5 cm retrocardiac nodular density, which may represent a pulmonary nodule/mass. 2. Recommend chest CT for further  characterization. Electronically signed by: Waddell Calk MD 12/29/2024 02:12 PM EST RP Workstation: [id number]     The results of significant diagnostics from this hospitalization (including imaging, microbiology, ancillary and laboratory) are listed below for reference.     Microbiology: Recent Results (from the past 240 hours)  Resp panel by RT-PCR (RSV, Flu A&B, Covid) Anterior Nasal Swab     Status: None   Collection Time: 01/04/25 11:36 AM   Specimen: Anterior Nasal Swab  Result Value Ref Range Status   SARS Coronavirus 2 by RT PCR NEGATIVE NEGATIVE Final    Comment: (NOTE) SARS-CoV-2 target nucleic acids are NOT DETECTED.  The SARS-CoV-2 RNA is generally detectable in upper respiratory specimens during the acute phase of infection. The lowest concentration of SARS-CoV-2 viral copies this assay can detect is 138 copies/mL. A negative result does not preclude SARS-Cov-2 infection and should not be used as the sole basis for treatment or other patient management decisions. A negative result may occur with  improper specimen collection/handling, submission of specimen other than nasopharyngeal swab, presence of viral mutation(s) within the areas targeted by this assay, and inadequate number of viral copies(<138 copies/mL). A negative result must be combined with clinical observations, patient history, and epidemiological information. The expected result is Negative.  Fact Sheet for Patients:  bloggercourse.com  Fact Sheet for Healthcare Providers:  seriousbroker.it  This test is no t yet approved or cleared by the [place]  FDA and  has been authorized for detection and/or diagnosis of SARS-CoV-2 by FDA under an Emergency Use Authorization (EUA). This EUA will remain  in effect (meaning this test can be used) for the duration of the COVID-19 declaration under Section 564(b)(1) of the Act, 21 U.S.C.section 360bbb-3(b)(1),  unless the authorization is terminated  or revoked sooner.       Influenza A by PCR NEGATIVE NEGATIVE Final   Influenza B by PCR NEGATIVE NEGATIVE Final    Comment: (NOTE) The Xpert Xpress SARS-CoV-2/FLU/RSV plus assay is intended as an aid in the diagnosis of influenza from Nasopharyngeal swab specimens and should not be used as a sole basis for treatment. Nasal washings and aspirates are unacceptable for Xpert Xpress SARS-CoV-2/FLU/RSV testing.  Fact Sheet for Patients: bloggercourse.com  Fact Sheet for Healthcare Providers: seriousbroker.it  This test is not yet approved or cleared by the [place]  FDA and has been authorized for detection and/or diagnosis of SARS-CoV-2 by FDA under an Emergency Use Authorization (EUA). This EUA will remain in effect (meaning this test can be used) for the duration of the COVID-19 declaration under Section 564(b)(1) of the Act, 21 U.S.C. section 360bbb-3(b)(1), unless the authorization is terminated or revoked.     Resp Syncytial Virus by PCR NEGATIVE NEGATIVE Final    Comment: (NOTE) Fact Sheet for Patients: bloggercourse.com  Fact Sheet for Healthcare Providers: seriousbroker.it  This test is not yet approved or cleared by the [place]  FDA and has been authorized for detection and/or diagnosis of SARS-CoV-2 by FDA under an Emergency Use Authorization (EUA). This EUA will remain in effect (meaning this test can be used) for the duration of the COVID-19 declaration under Section 564(b)(1) of the Act, 21 U.S.C. section 360bbb-3(b)(1), unless the authorization is terminated or revoked.  Performed at [facility], [address], [place], [place] [address]   Surgical  pcr screen     Status: None   Collection Time: 01/07/25  5:17 PM   Specimen: Nasal Mucosa; Nasal Swab  Result Value Ref Range Status   MRSA, PCR NEGATIVE NEGATIVE  Final   Staphylococcus aureus NEGATIVE NEGATIVE Final    Comment: (NOTE) The Xpert SA Assay (FDA approved for NASAL specimens in patients 31 years of age and older), is one component of a comprehensive surveillance program. It is not intended to diagnose infection nor to guide or monitor treatment. Performed at [facility], [address], [place], [place] [address]      Labs: BNP (last 3 results) No results for input(s): BNP in the last 8760 hours. Basic Metabolic Panel: Recent Labs  Lab 01/06/25 0157 01/07/25 0154 01/08/25 0206 01/09/25 0134 01/10/25 0209 01/11/25 0150  NA 138 136 136 137 134* 134*  K 4.1 4.1 4.5 4.7 4.8 5.0  CL 106 102 102 102 101 101  CO2 23 23 22 22 22 23   GLUCOSE 145* 157* 226* 257* 279* 281*  BUN 29* 29* 32* 52* 49* 35*  CREATININE 1.07* 1.06* 0.98 1.49* 1.18* 0.93  CALCIUM  9.0 9.3 9.6 9.7 8.7* 8.7*  MG 1.6* 1.6*  --  1.8 1.7 1.8  PHOS 2.5 2.5  --  3.2 4.2 2.8   Liver Function Tests: Recent Labs  Lab 01/09/25 0134 01/10/25 0209 01/11/25 0150  AST 26 21 18   ALT 23 18 13   ALKPHOS 78 66 74  BILITOT 0.5 0.4 0.5  PROT 7.0 6.0* 6.0*  ALBUMIN 3.9 3.5 3.4*   No results for input(s): LIPASE, AMYLASE in the last 168 hours. No results for input(s): AMMONIA in the last 168 hours. CBC: Recent Labs  Lab 01/07/25 0154 01/08/25 0206 01/09/25 0134 01/10/25 0209 01/11/25 0150  WBC 8.7 7.6 10.9* 12.0* 10.7*  NEUTROABS  --   --  8.8* 9.9* 8.6*  HGB 12.2 12.7 12.4 11.4* 11.6*  HCT 36.8 37.6 37.5 34.0* 34.8*  MCV 92.7 92.4 92.6 92.9 92.6  PLT 312 317 378 319 272   Cardiac Enzymes: No results for input(s): CKTOTAL, CKMB, CKMBINDEX, TROPONINI in the last 168 hours. BNP: Invalid input(s): POCBNP CBG: Recent Labs  Lab 01/11/25 0638 01/11/25 1235 01/11/25 1737 01/11/25 2150 01/12/25 0642  GLUCAP 247* 279* 267* 237* 195*   D-Dimer No results for input(s): DDIMER in the last 72 hours. Hgb A1c No results for  input(s): HGBA1C in the last 72 hours. Lipid Profile No results for input(s): CHOL, HDL, LDLCALC, TRIG, CHOLHDL, LDLDIRECT in the last 72 hours. Thyroid  function studies No results for input(s): TSH, T4TOTAL, T3FREE, THYROIDAB in the last 72 hours.  Invalid input(s): FREET3 Anemia work up No results for input(s): VITAMINB12, FOLATE, FERRITIN, TIBC, IRON, RETICCTPCT in the last 72 hours. Urinalysis    Component Value Date/Time   COLORURINE YELLOW 03/04/2011 2244   APPEARANCEUR CLEAR 03/04/2011 2244   LABSPEC 1.038 (H) 03/04/2011 2244   PHURINE 5.5 03/04/2011 2244   GLUCOSEU >1000 (A) 03/04/2011 2244   HGBUR TRACE (A) 03/04/2011 2244   BILIRUBINUR NEGATIVE 03/04/2011 2244   KETONESUR NEGATIVE 03/04/2011 2244   PROTEINUR NEGATIVE 03/04/2011 2244   UROBILINOGEN 0.2 03/04/2011 2244   NITRITE NEGATIVE 03/04/2011 2244   LEUKOCYTESUR NEGATIVE 03/04/2011 2244   Sepsis Labs Recent Labs  Lab 01/08/25 0206 01/09/25 0134 01/10/25 0209 01/11/25 0150  WBC 7.6 10.9* 12.0* 10.7*   Microbiology Recent Results (from the past 240 hours)  Resp panel by RT-PCR (RSV, Flu A&B, Covid) Anterior  Nasal Swab     Status: None   Collection Time: 01/04/25 11:36 AM   Specimen: Anterior Nasal Swab  Result Value Ref Range Status   SARS Coronavirus 2 by RT PCR NEGATIVE NEGATIVE Final    Comment: (NOTE) SARS-CoV-2 target nucleic acids are NOT DETECTED.  The SARS-CoV-2 RNA is generally detectable in upper respiratory specimens during the acute phase of infection. The lowest concentration of SARS-CoV-2 viral copies this assay can detect is 138 copies/mL. A negative result does not preclude SARS-Cov-2 infection and should not be used as the sole basis for treatment or other patient management decisions. A negative result may occur with  improper specimen collection/handling, submission of specimen other than nasopharyngeal swab, presence of viral mutation(s) within  the areas targeted by this assay, and inadequate number of viral copies(<138 copies/mL). A negative result must be combined with clinical observations, patient history, and epidemiological information. The expected result is Negative.  Fact Sheet for Patients:  bloggercourse.com  Fact Sheet for Healthcare Providers:  seriousbroker.it  This test is no t yet approved or cleared by the [place]  FDA and  has been authorized for detection and/or diagnosis of SARS-CoV-2 by FDA under an Emergency Use Authorization (EUA). This EUA will remain  in effect (meaning this test can be used) for the duration of the COVID-19 declaration under Section 564(b)(1) of the Act, 21 U.S.C.section 360bbb-3(b)(1), unless the authorization is terminated  or revoked sooner.       Influenza A by PCR NEGATIVE NEGATIVE Final   Influenza B by PCR NEGATIVE NEGATIVE Final    Comment: (NOTE) The Xpert Xpress SARS-CoV-2/FLU/RSV plus assay is intended as an aid in the diagnosis of influenza from Nasopharyngeal swab specimens and should not be used as a sole basis for treatment. Nasal washings and aspirates are unacceptable for Xpert Xpress SARS-CoV-2/FLU/RSV testing.  Fact Sheet for Patients: bloggercourse.com  Fact Sheet for Healthcare Providers: seriousbroker.it  This test is not yet approved or cleared by the [place]  FDA and has been authorized for detection and/or diagnosis of SARS-CoV-2 by FDA under an Emergency Use Authorization (EUA). This EUA will remain in effect (meaning this test can be used) for the duration of the COVID-19 declaration under Section 564(b)(1) of the Act, 21 U.S.C. section 360bbb-3(b)(1), unless the authorization is terminated or revoked.     Resp Syncytial Virus by PCR NEGATIVE NEGATIVE Final    Comment: (NOTE) Fact Sheet for  Patients: bloggercourse.com  Fact Sheet for Healthcare Providers: seriousbroker.it  This test is not yet approved or cleared by the [place]  FDA and has been authorized for detection and/or diagnosis of SARS-CoV-2 by FDA under an Emergency Use Authorization (EUA). This EUA will remain in effect (meaning this test can be used) for the duration of the COVID-19 declaration under Section 564(b)(1) of the Act, 21 U.S.C. section 360bbb-3(b)(1), unless the authorization is terminated or revoked.  Performed at [facility], [address], [place], [place] [address]   Surgical pcr screen     Status: None   Collection Time: 01/07/25  5:17 PM   Specimen: Nasal Mucosa; Nasal Swab  Result Value Ref Range Status   MRSA, PCR NEGATIVE NEGATIVE Final   Staphylococcus aureus NEGATIVE NEGATIVE Final    Comment: (NOTE) The Xpert SA Assay (FDA approved for NASAL specimens in patients 67 years of age and older), is one component of a comprehensive surveillance program. It is not intended to diagnose infection nor to guide or monitor treatment. Performed at [facility], [address] [place], [place] [address]      Time coordinating discharge:  I have spent 35 minutes face to face with the patient and on the ward discussing the patients care, assessment, plan and disposition with other care givers. >50% of the time was devoted counseling the patient about the risks and benefits of treatment/Discharge disposition and coordinating care.   SIGNED:   Burgess JAYSON Dare, MD  [facility] 01/12/2025, 11:08 AM   If 7PM-7AM, please contact night-coverage      [1]  Allergies Allergen Reactions   Shellfish Allergy Other (See Comments)    Tongue and Throat tingling and itching. No swelling or SOB

## 2025-01-13 ENCOUNTER — Inpatient Hospital Stay (HOSPITAL_COMMUNITY)

## 2025-01-13 ENCOUNTER — Inpatient Hospital Stay (HOSPITAL_COMMUNITY): Attending: Student

## 2025-01-13 DIAGNOSIS — G8250 Quadriplegia, unspecified: Secondary | ICD-10-CM

## 2025-01-13 LAB — COMPREHENSIVE METABOLIC PANEL WITH GFR
ALT: 15 U/L (ref 0–44)
AST: 18 U/L (ref 15–41)
Albumin: 3.3 g/dL — ABNORMAL LOW (ref 3.5–5.0)
Alkaline Phosphatase: 86 U/L (ref 38–126)
Anion gap: 10 (ref 5–15)
BUN: 30 mg/dL — ABNORMAL HIGH (ref 6–20)
CO2: 25 mmol/L (ref 22–32)
Calcium: 9.4 mg/dL (ref 8.9–10.3)
Chloride: 97 mmol/L — ABNORMAL LOW (ref 98–111)
Creatinine, Ser: 0.8 mg/dL (ref 0.44–1.00)
GFR, Estimated: >60 mL/min
Glucose, Bld: 177 mg/dL — ABNORMAL HIGH (ref 70–99)
Potassium: 4.4 mmol/L (ref 3.5–5.1)
Sodium: 132 mmol/L — ABNORMAL LOW (ref 135–145)
Total Bilirubin: 0.7 mg/dL (ref 0.0–1.2)
Total Protein: 6.1 g/dL — ABNORMAL LOW (ref 6.5–8.1)

## 2025-01-13 LAB — CBC WITH DIFFERENTIAL/PLATELET
Abs Immature Granulocytes: 0.04 K/uL (ref 0.00–0.07)
Basophils Absolute: 0 K/uL (ref 0.0–0.1)
Basophils Relative: 0 %
Eosinophils Absolute: 0 K/uL (ref 0.0–0.5)
Eosinophils Relative: 0 %
HCT: 36.2 % (ref 36.0–46.0)
Hemoglobin: 12.4 g/dL (ref 12.0–15.0)
Immature Granulocytes: 0 %
Lymphocytes Relative: 20 %
Lymphs Abs: 2.2 K/uL (ref 0.7–4.0)
MCH: 31 pg (ref 26.0–34.0)
MCHC: 34.3 g/dL (ref 30.0–36.0)
MCV: 90.5 fL (ref 80.0–100.0)
Monocytes Absolute: 1 K/uL (ref 0.1–1.0)
Monocytes Relative: 9 %
Neutro Abs: 7.9 K/uL — ABNORMAL HIGH (ref 1.7–7.7)
Neutrophils Relative %: 71 %
Platelets: 273 K/uL (ref 150–400)
RBC: 4 MIL/uL (ref 3.87–5.11)
RDW: 11.9 % (ref 11.5–15.5)
WBC: 11.2 K/uL — ABNORMAL HIGH (ref 4.0–10.5)
nRBC: 0 % (ref 0.0–0.2)

## 2025-01-13 LAB — GLUCOSE, CAPILLARY
Glucose-Capillary: 163 mg/dL — ABNORMAL HIGH (ref 70–99)
Glucose-Capillary: 294 mg/dL — ABNORMAL HIGH (ref 70–99)
Glucose-Capillary: 315 mg/dL — ABNORMAL HIGH (ref 70–99)
Glucose-Capillary: 253 mg/dL — ABNORMAL HIGH (ref 70–99)

## 2025-01-13 MED ORDER — SENNA 8.6 MG PO TABS
2.0000 | ORAL_TABLET | Freq: Two times a day (BID) | ORAL | Status: DC
  Administered 2025-01-13 – 2025-01-16 (×4): 17.2 mg via ORAL
  Filled 2025-01-13 (×6): qty 2

## 2025-01-13 MED ORDER — OXYCODONE HCL 5 MG PO TABS
10.0000 mg | ORAL_TABLET | ORAL | Status: AC | PRN
  Administered 2025-01-13 – 2025-01-14 (×6): 15 mg via ORAL
  Administered 2025-01-14: 10 mg via ORAL
  Administered 2025-01-15 – 2025-01-16 (×5): 15 mg via ORAL
  Administered 2025-01-16: 10 mg via ORAL
  Administered 2025-01-17 – 2025-01-22 (×20): 15 mg via ORAL
  Administered 2025-01-22: 10 mg via ORAL
  Administered 2025-01-23 – 2025-01-26 (×7): 15 mg via ORAL
  Administered 2025-01-26 – 2025-01-27 (×2): 10 mg via ORAL
  Administered 2025-01-28: 15 mg via ORAL
  Administered 2025-01-28 (×2): 10 mg via ORAL
  Administered 2025-01-29 – 2025-01-30 (×5): 15 mg via ORAL
  Administered 2025-01-30: 10 mg via ORAL
  Administered 2025-01-30 – 2025-01-31 (×3): 15 mg via ORAL
  Administered 2025-01-31 (×2): 10 mg via ORAL
  Administered 2025-02-01 (×2): 15 mg via ORAL
  Administered 2025-02-01: 10 mg via ORAL
  Administered 2025-02-02 – 2025-02-03 (×5): 15 mg via ORAL
  Filled 2025-01-13 (×9): qty 3
  Filled 2025-01-13: qty 2
  Filled 2025-01-13: qty 3
  Filled 2025-01-13: qty 2
  Filled 2025-01-13 (×12): qty 3
  Filled 2025-01-13: qty 2
  Filled 2025-01-13 (×7): qty 3
  Filled 2025-01-13: qty 2
  Filled 2025-01-13 (×5): qty 3
  Filled 2025-01-13: qty 2
  Filled 2025-01-13 (×2): qty 3
  Filled 2025-01-13: qty 2
  Filled 2025-01-13 (×5): qty 3
  Filled 2025-01-13: qty 2
  Filled 2025-01-13 (×5): qty 3
  Filled 2025-01-13 (×2): qty 2
  Filled 2025-01-13 (×9): qty 3
  Filled 2025-01-13: qty 2
  Filled 2025-01-13: qty 3

## 2025-01-13 MED ORDER — DEXAMETHASONE 0.5 MG PO TABS
1.0000 mg | ORAL_TABLET | Freq: Every day | ORAL | Status: AC
  Administered 2025-01-20 – 2025-01-22 (×3): 1 mg via ORAL
  Filled 2025-01-13 (×3): qty 2

## 2025-01-13 MED ORDER — DEXAMETHASONE 0.5 MG PO TABS
1.0000 mg | ORAL_TABLET | Freq: Two times a day (BID) | ORAL | Status: AC
  Administered 2025-01-16 – 2025-01-19 (×6): 1 mg via ORAL
  Filled 2025-01-13 (×6): qty 2

## 2025-01-13 MED ORDER — DEXAMETHASONE 0.5 MG PO TABS
1.0000 mg | ORAL_TABLET | Freq: Four times a day (QID) | ORAL | Status: AC
  Administered 2025-01-13: 1 mg via ORAL
  Filled 2025-01-13: qty 2

## 2025-01-13 MED ORDER — DEXAMETHASONE 0.5 MG PO TABS
1.0000 mg | ORAL_TABLET | Freq: Three times a day (TID) | ORAL | Status: AC
  Administered 2025-01-13 – 2025-01-16 (×9): 1 mg via ORAL
  Filled 2025-01-13 (×9): qty 2

## 2025-01-13 MED ORDER — LISINOPRIL 5 MG PO TABS
5.0000 mg | ORAL_TABLET | Freq: Every day | ORAL | Status: AC
  Administered 2025-01-13 – 2025-02-03 (×22): 5 mg via ORAL
  Filled 2025-01-13 (×22): qty 1

## 2025-01-13 MED ORDER — OXYCODONE HCL 5 MG PO TABS: 5.0000 mg | ORAL_TABLET | Freq: Once | ORAL | Status: DC

## 2025-01-13 NOTE — Progress Notes (Signed)
 IP Rehab Bowel Program Documentation  01/12/2025 Bowel Program Start time 1800  Dig Stim Indicated? Yes  Dig Stim Prior to Suppository or mini Enema X 5   Output from dig stim: Minimal  Ordered intervention: Suppository Yes , mini enema No ,   Repeat dig stim after Suppository or Mini enema  X 5,  Output? Minimal   Bowel Program Complete? No , handoff given yes  Patient Tolerated? Yes

## 2025-01-13 NOTE — Plan of Care (Signed)
" °  Problem: Consults Goal: RH SPINAL CORD INJURY PATIENT EDUCATION Description:  See Patient Education module for education specifics.  Outcome: Progressing   Problem: SCI BOWEL ELIMINATION Goal: RH STG MANAGE BOWEL WITH ASSISTANCE Description: STG Manage Bowel with bowel program min Assistance. Outcome: Progressing   Problem: SCI BLADDER ELIMINATION Goal: RH STG MANAGE BLADDER WITH ASSISTANCE Description: STG Manage Bladder With bladder program min Assistance Outcome: Progressing   "

## 2025-01-13 NOTE — Evaluation (Signed)
 Occupational Therapy Assessment and Plan  Patient Details  Name: April Warren MRN: [id number] Date of Birth: [birth year]  OT Diagnosis: abnormal posture, muscle weakness (generalized), and quadriparesis at level C6 incomplete Rehab Potential: Rehab Potential (ACUTE ONLY): Good ELOS: 3-4 weeks   Today's Date: 01/13/2025 OT Individual Time: 8954-8841 OT Individual Time Calculation (min): 73 min     Hospital Problem: Principal Problem:   Acute incomplete quadriplegia (HCC) Active Problems:   Spinal cord injury, cervical region [facility])   Past Medical History:  Past Medical History:  Diagnosis Date   Allergy    Asthma    Certain time of the year   Chicken pox 54 yrs old   Diabetes mellitus 08/12/2012   type 2- dr alvia diagnosed   Diabetic retinopathy (HCC)    Dysmenorrhea 08/20/2012   HTN (hypertension) 08/20/2012   Hyperlipidemia, mixed 08/28/2013   Neuromuscular disorder (HCC)    Not sure of date   Neuropathy 02/16/2017   Obesity    Obesity, unspecified 08/28/2013   Other and unspecified hyperlipidemia 08/28/2013   Preventative health care 08/20/2012   Sinusitis 11/14/2013   Sinusitis, acute 05/02/2016   Past Surgical History:  Past Surgical History:  Procedure Laterality Date   BREAST BIOPSY Right 01/29/2024   US  RT BREAST BX W LOC DEV 1ST LESION IMG BX SPEC US  GUIDE 01/29/2024 [facility] MAMMOGRAPHY   EYE SURGERY     Laser surgery for bleeds   REFRACTIVE SURGERY  08/12/2012   right, left on 09/16/12    Assessment & Plan Clinical Impression: Patient is a 54 y.o. year old female with recent admission to the hospital on 01/02/2025 with complaints of falls and worsening weakness over the past 2 days. 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 Moderate to severe spinal canal stenosis with cord compression at C5-6 with possible cord signal abnormality favoring  myelomalacia. Moderate spinal canal stenosis at C2-3 without cord signal abnormality. 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 Due to AKI and CKD with cr of 1.6 and acute bronchitis, surgery was delayed for 1 week. The patient underwent C2-C7 posterior laminectomy with lateral mass fusion an fixation by Dr. Joshua on 1/12 .  Patient transferred to CIR on 01/12/2025 .    Patient currently requires max with basic self-care skills secondary to muscle weakness and muscle paralysis and decreased coordination.  Prior to hospitalization, patient could complete IADLs with independent .  Patient will benefit from skilled intervention to increase independence with basic self-care skills prior to discharge home with care partner.  Anticipate patient will require minimal physical assistance and follow up home health and follow up outpatient.  OT - End of Session Activity Tolerance: Improving Endurance Deficit: Yes OT Assessment Rehab Potential (ACUTE ONLY): Good OT Patient demonstrates impairments in the following area(s): Balance;Sensory;Endurance;Motor;Pain OT Basic ADL's Functional Problem(s): Eating;Grooming;Bathing;Dressing;Toileting OT Advanced ADL's Functional Problem(s): Simple Meal Preparation OT Transfers Functional Problem(s): Tub/Shower;Toilet OT Additional Impairment(s): Fuctional Use of Upper Extremity OT Plan OT Intensity: Minimum of 1-2 x/day, 45 to 90 minutes OT Duration/Estimated Length of Stay: 3-4 weeks OT Treatment/Interventions: Balance/vestibular training;Disease mangement/prevention;Neuromuscular re-education;Self Care/advanced ADL retraining;Therapeutic Exercise;UE/LE Strength taining/ROM;Pain management;DME/adaptive equipment instruction;Community reintegration;Patient/family education;Functional electrical stimulation;Splinting/orthotics;UE/LE Coordination activities;Discharge planning;Functional mobility training;Therapeutic Activities OT Self Feeding  Anticipated Outcome(s): Modified Independent OT Basic Self-Care Anticipated Outcome(s): Supervision OT Toileting Anticipated Outcome(s): Supervision OT Bathroom Transfers Anticipated Outcome(s): Supervision OT Recommendation Recommendations for Other Services: Therapeutic Recreation consult  Therapeutic Recreation Interventions: Outing/community reintergration;Stress management;Kitchen group Patient destination: Home Follow Up Recommendations: Home health OT;Outpatient OT;24 hour supervision/assistance Equipment Recommended: 3 in 1 bedside comode;Tub/shower bench;To be determined Equipment Details: patient and SO educated on tub transfer bench and possible equipment needs for home   OT Evaluation Precautions/Restrictions  Precautions Precautions: Fall Recall of Precautions/Restrictions: Intact Precaution/Restrictions Comments: cervical Restrictions Weight Bearing Restrictions Per Provider Order: No General Chart Reviewed: Yes Family/Caregiver Present: Yes Vital Signs   Pain Pain Assessment Pain Scale: 0-10 Pain Score: 8  Pain Type: Surgical pain Pain Location: Neck Pain Descriptors / Indicators: Aching Pain Onset: On-going Pain Intervention(s): Massage Home Living/Prior Functioning Home Living Family/patient expects to be discharged to:: Private residence Living Arrangements: Spouse/significant other, Children Available Help at Discharge: Family, Available 24 hours/day Type of Home: House Home Access: Ramped entrance Home Layout: One level Bathroom Shower/Tub: Tub/shower unit, Engineer, Building Services: Handicapped height Bathroom Accessibility: Yes Additional Comments: they have 5 children in the home 3yo- 33 yo adopted.  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 Vision Baseline Vision/History: 5 Retinopathy Ability to See in Adequate Light: 0 Adequate Patient Visual Report: No change from baseline Perception  Perception: Within Functional Limits Praxis Praxis: WFL Cognition Cognition Overall Cognitive Status: Within Functional Limits for tasks assessed Arousal/Alertness: Awake/alert Memory: Appears intact Awareness: Appears intact Problem Solving: Appears intact Safety/Judgment: Appears intact Brief Interview for Mental Status (BIMS) Repetition of Three Words (First Attempt): 3 Temporal Orientation: Year: Correct Temporal Orientation: Month: Accurate within 5 days Temporal Orientation: Day: Correct Recall: Sock: Yes, no cue required Recall: Blue: Yes, no cue required Recall: Bed: Yes, no cue required BIMS Summary Score: 15 Sensation Sensation Light Touch: Impaired Detail Peripheral sensation comments: Patient with sensory impaiment in B hands. Patient reports some deficits prior, but not official dx of neuropathy in hands. Light Touch Impaired Details: Impaired LUE;Impaired RUE Hot/Cold: Appears Intact Proprioception: Appears Intact Stereognosis: Appears Intact Coordination Gross Motor Movements are Fluid and Coordinated: No Fine Motor Movements are Fluid and Coordinated: No Coordination and Movement Description: limited by weakness Motor  Motor Motor: Tetraplegia  Trunk/Postural Assessment  Cervical Assessment Cervical Assessment: Exceptions to WFL (cervical) Postural Control Postural Control: Deficits on evaluation  Balance Balance Balance Assessed: Yes Dynamic Sitting Balance Dynamic Sitting - Balance Support: During functional activity Dynamic Sitting - Level of Assistance: 5: Stand by assistance Dynamic Sitting - Balance Activities: Reaching across midline;Reaching for objects Static Standing Balance Static Standing - Balance Support: Bilateral upper  extremity supported;During functional activity Static Standing - Level of Assistance: 4: Min assist Static Standing - Comment/# of Minutes: 1 minute Dynamic Standing Balance Dynamic Standing - Balance Support: Bilateral upper extremity supported Dynamic Standing - Level of Assistance: 3: Mod assist Dynamic Standing - Balance Activities: Forward lean/weight shifting;Lateral lean/weight shifting Dynamic Standing - Comments: standing for wt shifts and clothing management Extremity/Trunk Assessment RUE Assessment Active Range of Motion (AROM) Comments: Patient with limitations into external rotation due to reported pain from surgical possitioning. Improving active range with manual treatment General Strength Comments: Patient with functional use of UE's  3+/5 shoulder flex and triceps, 4/5 biceps. gross grasp 18lbs, lateral pinch 5 llbs, 3 pt pinch 6 lbs LUE Assessment Active Range of Motion (AROM) Comments: shoulder flexion to 130 degrees without limitation.  General Strength Comments: fair strength during functional activities with notes weakness in supination, triceps activation and shoulder flexion. Gross grasp 20 lbs, lateral pinch 4 lbs, 3 pt pinch 6 lbs.  Care Tool Care Tool Self Care Eating   Eating Assist Level: Minimal Assistance - Patient > 75%    Oral Care    Oral Care Assist Level: Moderate Assistance - Patient 50 - 74%    Bathing   Body parts bathed by patient: Right arm;Left arm;Chest;Right upper leg;Left upper leg;Face Body parts bathed by helper: Abdomen;Front perineal area;Buttocks;Right lower leg;Left lower leg   Assist Level: Maximal Assistance - Patient 24 - 49%    Upper Body Dressing(including orthotics)   What is the patient wearing?: Hospital gown only   Assist Level: Minimal Assistance - Patient > 75%    Lower Body Dressing (excluding footwear)   What is the patient wearing?: Pants Assist for lower body dressing: Maximal Assistance - Patient 25 - 49%     Putting on/Taking off footwear   What is the patient wearing?: Non-skid slipper socks Assist for footwear: Dependent - Patient 0%       Care Tool Toileting Toileting activity   Assist for toileting: Moderate Assistance - Patient 50 - 74%     Care Tool Bed Mobility Roll left and right activity     Assist Level: Minimal Assistance - Patient > 75%    Sit to lying activity     Assist for toileting: Moderate Assistance - Patient 50 - 74%    Lying to sitting on side of bed activity     Assist for toileting: Moderate Assistance - Patient 50 - 74%     Care Tool Transfers Sit to stand transfer   Sit to stand assist level: Moderate Assistance - Patient 50 - 74%    Chair/bed transfer   Chair/bed transfer assist level: 2 Helpers     Toilet transfer   Assist Level: Moderate Assistance - Patient 50 - 74%     Care Tool Cognition  Expression of Ideas and Wants Expression of Ideas and Wants: 4. Without difficulty (complex and basic) - expresses complex messages without difficulty and with speech that is clear and easy to understand  Understanding Verbal and Non-Verbal Content Understanding Verbal and Non-Verbal Content: 4. Understands (complex and basic) - clear comprehension without cues or repetitions   Memory/Recall Ability Memory/Recall Ability : Current season;Location of own room;Staff names and faces;That he or she is in a hospital/hospital unit   Refer to Care Plan for Long Term Goals  SHORT TERM GOAL WEEK 1 OT Short Term Goal 1 (Week 1): Patient to perform LE dressing with Min assist OT Short Term Goal 2 (Week 1): Patient to perform tub bench transfer with Mod Assist OT Short Term Goal 3 (Week 1): Patient to increase AROM into R shoulder flexion to 150 degrees OT Short Term Goal 4 (Week 1): Patient to perform toileting with Min assist  Recommendations for other services: Adult Nurse group, Stress management, and Outing/community reintegration    Skilled Therapeutic Intervention ADL ADL Eating: Minimal assistance Where Assessed-Eating: Chair Grooming: Moderate assistance Where Assessed-Grooming: Chair Upper Body Bathing: Moderate assistance Where Assessed-Upper Body Bathing: Sitting at sink Lower Body Bathing: Maximal assistance Where Assessed-Lower Body Bathing: Sitting at sink Upper Body Dressing: Moderate assistance Where Assessed-Upper Body Dressing: Wheelchair Lower Body Dressing: Maximal assistance Where Assessed-Lower Body Dressing: Wheelchair Toileting: Moderate assistance Where Assessed-Toileting: Toilet;Bedside Commode Toilet Transfer: Moderate assistance Toilet Transfer Method: Stand pivot Toilet  Transfer Equipment: Grab bars;Extra wide bedside commode Tub/Shower Transfer: Unable to assess Tub/Shower Transfer Method: Unable to assess [employer] Transfer: Unable to assess [employer] Method: Unable to assess ADL Comments: Patient with sensory deficits affecting ADL performance. Assist with balance and clothing managment in standing at RW. Initiated education on AE for dressing and bathing Mobility  Bed Mobility Bed Mobility: Supine to Sit Supine to Sit: Moderate Assistance - Patient 50-74% Transfers Sit to Stand: Minimal Assistance - Patient > 75% Stand to Sit: Minimal Assistance - Patient > 75%   Discharge Criteria: Patient will be discharged from OT if patient refuses treatment 3 consecutive times without medical reason, if treatment goals not met, if there is a change in medical status, if patient makes no progress towards goals or if patient is discharged from hospital.  The above assessment, treatment plan, treatment alternatives and goals were discussed and mutually agreed upon: by patient and by family  Isaiah JONETTA Freund 01/13/2025, 12:55 PM

## 2025-01-13 NOTE — Evaluation (Signed)
 Recreational Therapy Assessment and Plan  Patient Details  Name: Shiah Berhow MRN: [id number] Date of Birth: 19-Dec-1971 Today's Date: 01/13/2025  Rehab Potential:  Good ELOS:   3 weeks  Assessment          Pt presents with decreased activity tolerance, decreased functional mobility, decreased balance, decreased coordination, feelings of stress Limiting pt's independence with leisure/community pursuits.  Met with pt & husband today to discuss TR services including leisure education, activity analysis/modifications and stress management.  Also discussed the importance of social, emotional, spiritual health in addition to physical health and their effects on overall health and wellness.  Pt stated understanding.    Plan  Min 1 TR session >20 minutes per week   Recommendations for other services: Neuropsych  Discharge Criteria: Patient will be discharged from TR if patient refuses treatment 3 consecutive times without medical reason.  If treatment goals not met, if there is a change in medical status, if patient makes no progress towards goals or if patient is discharged from hospital.  The above assessment, treatment plan, treatment alternatives and goals were discussed and mutually agreed upon: by patient  Bernardo Brayman 01/13/2025, 12:28 PM

## 2025-01-13 NOTE — Progress Notes (Signed)
 Physical Therapy Session Note  Patient Details  Name: April Warren MRN: [id number] Date of Birth: 07/23/1971  Today's Date: 01/13/2025 PT Individual Time: 1330-1445 PT Individual Time Calculation (min): 75 min   Short Term Goals: Week 1:  PT Short Term Goal 1 (Week 1): pt will stand >1 min without knee buckling PT Short Term Goal 2 (Week 1): pt will ambulate 20 ft PT Short Term Goal 3 (Week 1): Pt will perform bed mobility with min a  Skilled Therapeutic Interventions/Progress Updates:  Pt seated in w/c on arrival and agreeable to therapy. Pt reports high pain levels, nsg made aware. Pt transported to therapy gym for time management and energy conservation. MMT taken, please see evaluation/flowsheet. Pt performed gait trial x 11.5 ft with min a and +2 w/c follow. Note slight hyperextension L>R, but no formal knee buckle. Pt limited by fatigue and balance. Pt performed 4 x 6 mini squats. Min fading to mod to stand from low mat table, cues for hand placement and momentum/anterior weight shift. Added t band to w/c push rim to assist with LUE grip for greatly improved propulsion this session. Pt does require cues for turning at times, will benefit from further practice. Pt returned to room and remained in w/c, was left with all needs in reach and alarm active.   Therapy Documentation Precautions:  Precautions Precautions: Fall Recall of Precautions/Restrictions: Intact Precaution/Restrictions Comments: cervical Restrictions Weight Bearing Restrictions Per Provider Order: No General:      Therapy/Group: Individual Therapy  [name] 01/13/2025, 2:17 PM

## 2025-01-13 NOTE — Evaluation (Signed)
 Physical Therapy Assessment and Plan  Patient Details  Name: April Warren MRN: [id number] Date of Birth: 07/25/1971  PT Diagnosis: Difficulty walking, Impaired sensation, Muscle spasms, Muscle weakness, Pain in shoulders/neck , and Quadriplegia Rehab Potential: Good ELOS: 2.5-3 weeks   Today's Date: 01/13/2025 PT Individual Time: 0900-1000 PT Individual Time Calculation (min): 60 min    Hospital Problem: Principal Problem:   Acute incomplete quadriplegia (HCC) Active Problems:   Spinal cord injury, cervical region [facility])   Past Medical History:  Past Medical History:  Diagnosis Date   Allergy    Asthma    Certain time of the year   Chicken pox 54 yrs old   Diabetes mellitus 08/12/2012   type 2- dr alvia diagnosed   Diabetic retinopathy (HCC)    Dysmenorrhea 08/20/2012   HTN (hypertension) 08/20/2012   Hyperlipidemia, mixed 08/28/2013   Neuromuscular disorder (HCC)    Not sure of date   Neuropathy 02/16/2017   Obesity    Obesity, unspecified 08/28/2013   Other and unspecified hyperlipidemia 08/28/2013   Preventative health care 08/20/2012   Sinusitis 11/14/2013   Sinusitis, acute 05/02/2016   Past Surgical History:  Past Surgical History:  Procedure Laterality Date   BREAST BIOPSY Right 01/29/2024   US  RT BREAST BX W LOC DEV 1ST LESION IMG BX SPEC US  GUIDE 01/29/2024 [facility] MAMMOGRAPHY   EYE SURGERY     Laser surgery for bleeds   REFRACTIVE SURGERY  08/12/2012   right, left on 09/16/12    Assessment & Plan Clinical Impression: April Warren is a 54 year old right handed female with PMHx T2DM, diabetic retinopathy, HTN, HLD, CKD 3a, obesity and 3 previous falls presented to [facility] on 01/02/2025 with complaints of falls and worsening weakness over the past 2 days. Per chart review she had been seen by her PCP where she was treated for bronchitis and sinusitis with course of doxycyline. She presented to the ER later that evening after slipping backwards going  up a ramp hitting her head, neck, left shoulder. She denied LOC or focal weakness at that time. Taking aspirin, nose bleed controlled and vitals were stable with EMS. Of note patient reported ongoing left lower extremity weakness since September 2025 and onset of left upper extremities since onset. Denies bowel or bladder problems.    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 Neurosurgery consulted recommended MRI C-spine and transfer to [facility]. 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 Moderate to severe spinal canal stenosis with cord compression at C5-6 with possible cord signal abnormality favoring myelomalacia. Moderate spinal canal stenosis at C2-3 without cord signal abnormality. 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 Due to AKI and CKD with cr of 1.6 and acute bronchitis, surgery was delayed for 1 week. The patient underwent C2-C7 posterior laminectomy with lateral mass fusion an fixation by Dr. Joshua on 1/12. Hospital course complicated by post operative urinary retention requiring foley catheter, now removed. Negative Covid, flu and RSV. Hgb A1c 5.1 9/25. SSI for now and CBGS monitoring. Holding oral home meds. Holding home HTN meds of metoprolol , lisinopril , triamterene -hydrochlorothiazide . DVT prophylaxis TED hose until able to resume Lovenox .    The patient was independent and driving without use of DME. She lives in a one level home with ramped entrance with her spouse and 5  underage children. Working with PT/OT requires Min assist +2 physical assistance for ambulation of 6 ft. Transfers with min to moderate assist and completes ADLs at bed level with mod to max assist. Therapy evaluations completed due to patient decreased functional mobility was  admitted for a comprehensive rehab program.  Patient transferred to CIR on 01/12/2025 .   Patient currently requires max with mobility secondary to muscle paralysis, decreased cardiorespiratoy endurance, impaired timing and sequencing, abnormal tone, and unbalanced muscle activation, and decreased sitting balance, decreased standing balance, decreased postural control, and decreased balance strategies.  Prior to hospitalization, patient was independent  with mobility and lived with Family, Spouse in a House home.  Home access is  Ramped entrance.  Patient will benefit from skilled PT intervention to maximize safe functional mobility, minimize fall risk, and decrease caregiver burden for planned discharge home with 24 hour supervision.  Anticipate patient will benefit from follow up HH at discharge.  PT - End of Session Activity Tolerance: Tolerates 30+ min activity with multiple rests Endurance Deficit: Yes PT Assessment Rehab Potential (ACUTE/IP ONLY): Good PT Barriers to Discharge: Decreased caregiver support;Home environment access/layout;Neurogenic Bowel & Bladder;Wound Care PT Patient demonstrates impairments in the following area(s): Balance;Pain;Edema;Safety;Endurance;Sensory;Motor;Skin Integrity PT Transfers Functional Problem(s): Bed Mobility;Bed to Chair;Car PT Locomotion Functional Problem(s): Ambulation;Wheelchair Mobility PT Plan PT Intensity: Minimum of 1-2 x/day ,45 to 90 minutes PT Frequency: 5 out of 7 days PT Duration Estimated Length of Stay: 2.5-3 weeks PT Treatment/Interventions: Ambulation/gait training;Discharge planning;DME/adaptive equipment instruction;Functional mobility training;Pain management;Psychosocial support;Splinting/orthotics;Therapeutic Activities;UE/LE Strength taining/ROM;UE/LE Coordination activities;Wheelchair propulsion/positioning;Therapeutic Exercise;Stair training;Skin care/wound management;Patient/family education;Neuromuscular re-education;Functional  electrical stimulation;Disease management/prevention;Balance/vestibular training;Community reintegration PT Transfers Anticipated Outcome(s): supervision with LRAD PT Locomotion Anticipated Outcome(s): CGA short distance gait, mod I w/c PT Recommendation Recommendations for Other Services: None Follow Up Recommendations: Home health PT Patient destination: Home Equipment Recommended: To be determined   PT Evaluation Precautions/Restrictions Precautions Precautions: Fall Recall of Precautions/Restrictions: Intact Precaution/Restrictions Comments: cervical Restrictions Weight Bearing Restrictions Per Provider Order: No General   Vital Signs  Pain Pain Assessment Pain Scale: 0-10 Pain Score: 8  Pain Type: Surgical pain Pain Location: Neck Pain Descriptors / Indicators: Aching Pain Onset: On-going Pain Intervention(s): Massage Pain Interference Pain Interference Pain Effect on Sleep: 3. Frequently Pain Interference with Therapy Activities: 1. Rarely or not at all Pain Interference with Day-to-Day Activities: 1. Rarely or not at all Home Living/Prior Functioning Home Living Available Help at Discharge: Family;Available 24 hours/day Type of Home: House Home Access: Ramped entrance Home Layout: One level Bathroom Shower/Tub: Tub/shower unit;Curtain Bathroom Toilet: Handicapped height Bathroom Accessibility: Yes Additional Comments: they have 5 children in the home 3yo- 13 yo adopted.  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 Patient reports some deficits prior, but not official dx of neuropathy in hands. Light Touch Impaired Details: Impaired LUE;Impaired RUE Hot/Cold: Appears Intact Proprioception: Appears Intact Stereognosis: Appears Intact Coordination Gross Motor Movements are Fluid and Coordinated: No Fine Motor  Movements are Fluid and Coordinated: No Coordination and Movement Description: limited by weakness Motor  Motor Motor: Tetraplegia   Trunk/Postural Assessment  Cervical Assessment Cervical Assessment: Exceptions to [facility] (cervical) Postural Control Postural Control: Deficits on evaluation  Balance Balance Balance Assessed: Yes Dynamic Sitting Balance Dynamic Sitting - Balance Support: During functional activity Dynamic Sitting - Level of Assistance: 5: Stand by assistance Dynamic Sitting - Balance Activities: Reaching across midline;Reaching for objects Static Standing Balance Static Standing - Balance Support: Bilateral upper extremity supported;During functional activity Static Standing - Level of Assistance: 4: Min assist Static Standing - Comment/# of Minutes: 1 minute Dynamic Standing Balance Dynamic Standing - Balance Support: Bilateral upper extremity supported Dynamic Standing - Level of Assistance: 3: Mod assist Dynamic Standing - Balance Activities: Forward lean/weight shifting;Lateral lean/weight shifting Dynamic Standing - Comments: standing for wt shifts and clothing management Extremity Assessment  RUE Assessment Active Range of Motion (AROM) Comments: Patient with limitations into external rotation due to reported pain from surgical possitioning. Improving active range with manual treatment General  Strength Comments: Patient with functional use of UE's  3+/5 shoulder flex and triceps, 4/5 biceps. gross grasp 18lbs, lateral pinch 5 llbs, 3 pt pinch 6 lbs LUE Assessment Active Range of Motion (AROM) Comments: shoulder flexion to 130 degrees without limitation. General Strength Comments: fair strength during functional activities with notes weakness in supination, triceps activation and shoulder flexion. Gross grasp 20 lbs, lateral pinch 4 lbs, 3 pt pinch 6 lbs. RLE Assessment RLE Assessment: Exceptions to [facility] General Strength Comments: 4/5 grossly, pt reports 80% of baseline LLE Assessment LLE Assessment: Exceptions to [facility] General Strength Comments: grossly 3+/5, pt reports 60% of baseline  Care Tool Care Tool Bed Mobility Roll left and right activity   Roll left and right assist level: Minimal Assistance - Patient > 75%    Sit to lying activity   Sit to lying assist level: Maximal Assistance - Patient 25 - 49%    Lying to sitting on side of bed activity   Lying to sitting on side of bed assist level: the ability to move from lying on the back to sitting on the side of the bed with no back support.: Moderate Assistance - Patient 50 - 74%     Care Tool Transfers Sit to stand transfer   Sit to stand assist level: Minimal Assistance - Patient > 75%    Chair/bed transfer   Chair/bed transfer assist level: 2 Web Designer transfer activity did not occur: Safety/medical concerns        Care Tool Locomotion Ambulation Ambulation activity did not occur: Safety/medical concerns        Walk 10 feet activity Walk 10 feet activity did not occur: Safety/medical concerns       Walk 50 feet with 2 turns activity Walk 50 feet with 2 turns activity did not occur: Safety/medical concerns      Walk 150 feet activity Walk 150 feet activity did not occur: Safety/medical concerns      Walk 10 feet on uneven surfaces activity Walk 10 feet on uneven surfaces activity did  not occur: Safety/medical concerns      Stairs Stair activity did not occur: Safety/medical concerns        Walk up/down 1 step activity Walk up/down 1 step or curb (drop down) activity did not occur: Safety/medical concerns      Walk up/down 4 steps activity Walk up/down 4 steps activity did not occur: Safety/medical concerns  Walk up/down 12 steps activity Walk up/down 12 steps activity did not occur: Safety/medical concerns      Pick up small objects from floor Pick up small object from the floor (from standing position) activity did not occur: Safety/medical concerns      Wheelchair Is the patient using a wheelchair?: Yes Type of Wheelchair: Manual   Wheelchair assist level: Dependent - Patient 0% Max wheelchair distance: 5 ft  Wheel 50 feet with 2 turns activity Wheelchair 50 feet with 2 turns activity did not occur: Safety/medical concerns    Wheel 150 feet activity Wheelchair 150 feet activity did not occur: Safety/medical concerns      Refer to Care Plan for Long Term Goals  SHORT TERM GOAL WEEK 1 PT Short Term Goal 1 (Week 1): pt will stand >1 min without knee buckling PT Short Term Goal 2 (Week 1): pt will ambulate 15 ft PT Short Term Goal 3 (Week 1): Pt will perform bed mobility with min a  Recommendations for other services: None   Skilled Therapeutic Intervention Evaluation completed (see details above) with patient education regarding purpose of PT evaluation, PT POC and goals, therapy schedule, weekly team meetings, and other CIR information including safety plan and fall risk safety. Pt reports unrated shoulder pain, premedicated. Rest and positioning provided as needed.  Pt performed the below functional mobility tasks with the specified levels of skilled cuing and assistance.    Mobility Bed Mobility Bed Mobility: Supine to Sit Supine to Sit: Moderate Assistance - Patient 50-74% Transfers Transfers: Sit to Stand;Stand to Sit;Stand Pivot  Transfers;Transfer Sit to Stand: Minimal Assistance - Patient > 75% Stand to Sit: Minimal Assistance - Patient > 75% Stand Pivot Transfers: Moderate Assistance - Patient 50 - 74% Stand Pivot Transfer Details: Verbal cues for sequencing;Verbal cues for safe use of DME/AE Transfer (Assistive device): Rolling walker Locomotion  Gait Ambulation: No Gait Gait: No Stairs / Additional Locomotion Stairs: No Wheelchair Mobility Wheelchair Mobility: Yes Wheelchair Assistance: Dependent - Patient 0% Wheelchair Propulsion: Both upper extremities Wheelchair Parts Management: Needs assistance Distance: 2 ft (limited by LUE)   Discharge Criteria: Patient will be discharged from PT if patient refuses treatment 3 consecutive times without medical reason, if treatment goals not met, if there is a change in medical status, if patient makes no progress towards goals or if patient is discharged from hospital.  The above assessment, treatment plan, treatment alternatives and goals were discussed and mutually agreed upon: by patient  April Warren 01/13/2025, 12:59 PM

## 2025-01-13 NOTE — Progress Notes (Signed)
 "                                                        PROGRESS NOTE   Subjective/Complaints:  Pt reports huge BM x1 yesterday on toilet- feels like still needs to go.  Agrees with increasing bowel meds since increasing pain meds.  Bowel program in chart- minimal results-   Pain is SO bad for pt- on 10 mg q3 hours prn- and took ~ q3 hours o/n- but pain just uncontrolled.  Never taken pain meds before like this.     BUN down to 30 and WBC up slightly to 11.2  ROS:  Pt denies SOB, abd pain, CP, N/V/C/D, and vision changes Per HPI Objective:   No results found. Recent Labs    01/11/25 0150 01/13/25 0542  WBC 10.7* 11.2*  HGB 11.6* 12.4  HCT 34.8* 36.2  PLT 272 273   Recent Labs    01/11/25 0150 01/13/25 0542  NA 134* 132*  K 5.0 4.4  CL 101 97*  CO2 23 25  GLUCOSE 281* 177*  BUN 35* 30*  CREATININE 0.93 0.80  CALCIUM  8.7* 9.4   No intake or output data in the 24 hours ending 01/13/25 0829      Physical Exam: Vital Signs Blood pressure (!) 147/64, pulse (!) 53, temperature 97.8 F (36.6 C), temperature source Oral, resp. rate 16, height 5' 9 (1.753 m), weight 115.2 kg, last menstrual period 10/28/2014, SpO2 97%.    General: awake, alert, appropriate, sitting up in bed; NAD HENT: conjugate gaze; oropharynx moist CV: regular rhythm; bradycardic  rate; no JVD Pulmonary: CTA B/L; no W/R/R- good air movement GI: soft, NT, ND, (+)BS Psychiatric: appropriate- interactive- can tell pt wincing in pain with little movements Neurological: Ox3 Hoffman's B/L Musculoskeletal:     Comments: RUE- biceps 4+/5; WE 4+ to 5-/5; Triceps 4+/5; Grip 4/5; FA 3/5 LUE- biceps 4/5; WE 5-/5; Triceps 4/5; Grip 3+/5; and FA 2/5 RLE- HF 4+/5; KE, DF 4/5; PF 5-/5 and EHL 5-/5 LLE- HF 4-/5; otherwise 4/5 throughout  Skin:    General: Skin is warm and dry.     Comments: Posterior neck incision surgical dressing in place- C/D/I  Neurological:     Comments: Ox3  Decreased to Light  touch in C6 to T1 on R and C5-T1 on L Intact in torso T2- T12 Intact L1-3 B/L And decreased L4-S1 from neuropathy? B/L Brisk Hoffman's B/L in Ue's DTR's reduced to 2+ in Ue's and LE's No clonus at wrist or LE's  Assessment/Plan: 1. Functional deficits which require 3+ hours per day of interdisciplinary therapy in a comprehensive inpatient rehab setting. Physiatrist is providing close team supervision and 24 hour management of active medical problems listed below. Physiatrist and rehab team continue to assess barriers to discharge/monitor patient progress toward functional and medical goals  Care Tool:  Bathing              Bathing assist       Upper Body Dressing/Undressing Upper body dressing        Upper body assist      Lower Body Dressing/Undressing Lower body dressing            Lower body assist       Toileting Toileting    Toileting assist  Transfers Chair/bed transfer  Transfers assist           Locomotion Ambulation   Ambulation assist              Walk 10 feet activity   Assist           Walk 50 feet activity   Assist           Walk 150 feet activity   Assist           Walk 10 feet on uneven surface  activity   Assist           Wheelchair     Assist               Wheelchair 50 feet with 2 turns activity    Assist            Wheelchair 150 feet activity     Assist          Blood pressure (!) 147/64, pulse (!) 53, temperature 97.8 F (36.6 C), temperature source Oral, resp. rate 16, height 5' 9 (1.753 m), weight 115.2 kg, last menstrual period 10/28/2014, SpO2 97%.  Medical Problem List and Plan: 1. Functional deficits secondary to traumatic incomplete C4 ASIA D quadriplegia due to fall              -patient may  shower- cover incision             -ELOS/Goals: initially said 3.5-4 weeks- now likely 2-2.5 weeks - Supervision to min A with RW             First  day of evaluations- con't CIR PT and OT  To work on strengthening, balance, ADLs and endurance 2.  Antithrombotics: -DVT/anticoagulation:  Mechanical:  Antiembolism stockings, knee (TED hose) Bilateral lower extremities             -antiplatelet therapy: per Dr. Joshua wait at least 5 days for DVT prophylaxis.              -Vas US -pending  3. Pain Management: Baclofen  5 mg BID. Oxycodone  and robaxin  PRN.   1/16- will increase Oxy to 10-15 mg q3 hours prn- since pain only went to 8/10 with pain meds- usually 9 or higher when meds wear off.  4. Mood/Behavior/Sleep: LCSW to follow for evaluation and support when available.              -antipsychotic agents: Ativan  0.5 mg bid prn  5. Neuropsych/cognition: This patient is capable of making decisions on her own behalf. 6. Skin/Wound Care: Routine pressure relief measures. Monitor incision sites for signs of infection. Hemo Vac removed 1/15.  7. Fluids/Electrolytes/Nutrition: Monitor I&O and weight. Follow up labs CBC/CMP                -Carb modified diet with supplements to continue.  8. Cervical spinal stenosis: s/p laminectomy by Dr. Joshua 1/12. NSGRY following, continue on decadron  taper.  9. DM2: A1c 7.1. Holding home metformin , glipizide  and januvia .   -Hyperglycemia in the setting of steroids. Monitor glucose ac/hs with SSI Novolog , Lantus  30 units.  1/16- CBG's running 163 to 284- instead of increasing lantus , will wean Decadron  and that should help 10.CKD IIIa: Stable, baseline range 1.1-1.5. Monitor BMP.   1/16- Cr 0.8 and BUN 30- is dry- will push fluids- and recheck Monday 11. Acute bronchitis: CT from 1/1 without acute intrathoracic pathology. Completed 10 day course of doxycycline  1/15.               -  encourage ins spiro.  12. HLD: Lipitor  80 mg  13. HTN: Holding metoprolol , lisinopril , triamterene -HCTZ. Monitor bp per protocol for need to resume.     1/16- BP is more elevated than had been running- will restart Lisinopril - will NOT  restart B blocker due to bradycardia-  14. Neurogenic bladder: Foley removed on 1/14  monitor PVRs d/t potential for urinary retention.   1/16- PVR x1 done- of zero- will d/w nurse and see if can get more before d/c'd in 3 days hopefully.  15. Neurogenic bowel: Continue Senna bid.              Will give Sorbitol  45 cc x1 this afternoon- if no BM today, will give 60cc tomorrow and SSE.   1/16- had huge BM on toilet- but still feels like could go- will increase Senna to 2 tabs BID since increasing bowel meds- and keep on Sorbitol  prn if needed- d/c'd Bowel program for now since went on toilet- but might need to come back if starts having incontinence.    I spent a total of  51  minutes on total care today- >50% coordination of care- due to  D/w nursing multiple times regarding fluid level and bowels and pain- also PVRs- review of notes since - made multiple meds changes, reviewed labs, vitals and B/B- including CBGs      LOS: 1 days A FACE TO FACE EVALUATION WAS PERFORMED  Kreston Ahrendt 01/13/2025, 8:29 AM     "

## 2025-01-13 NOTE — Plan of Care (Signed)
" °  Problem: RH Balance Goal: LTG Patient will maintain dynamic standing with ADLs (OT) Description: LTG:  Patient will maintain dynamic standing balance with assist during activities of daily living (OT)  Flowsheets (Taken 01/13/2025 1222) LTG: Pt will maintain dynamic standing balance during ADLs with: Supervision/Verbal cueing   Problem: RH Eating Goal: LTG Patient will perform eating w/assist, cues/equip (OT) Description: LTG: Patient will perform eating with assist, with/without cues using equipment (OT) Flowsheets (Taken 01/13/2025 1222) LTG: Pt will perform eating with assistance level of: Independent with assistive device  LTG: Pt will perform eating using equipment:  Built up handle  Rocker knife   Problem: RH Grooming Goal: LTG Patient will perform grooming w/assist,cues/equip (OT) Description: LTG: Patient will perform grooming with assist, with/without cues using equipment (OT) Flowsheets (Taken 01/13/2025 1222) LTG: Pt will perform grooming with assistance level of: Independent with assistive device    Problem: RH Bathing Goal: LTG Patient will bathe all body parts with assist levels (OT) Description: LTG: Patient will bathe all body parts with assist levels (OT) Flowsheets (Taken 01/13/2025 1222) LTG: Pt will perform bathing with assistance level/cueing: Set up assist  LTG: Position pt will perform bathing:  Shower  At sink   Problem: RH Dressing Goal: LTG Patient will perform upper body dressing (OT) Description: LTG Patient will perform upper body dressing with assist, with/without cues (OT). Flowsheets (Taken 01/13/2025 1222) LTG: Pt will perform upper body dressing with assistance level of: Set up assist Goal: LTG Patient will perform lower body dressing w/assist (OT) Description: LTG: Patient will perform lower body dressing with assist, with/without cues in positioning using equipment (OT) Flowsheets (Taken 01/13/2025 1222) LTG: Pt will perform lower body dressing  with assistance level of: Supervision/Verbal cueing   Problem: RH Toileting Goal: LTG Patient will perform toileting task (3/3 steps) with assistance level (OT) Description: LTG: Patient will perform toileting task (3/3 steps) with assistance level (OT)  Flowsheets (Taken 01/13/2025 1222) LTG: Pt will perform toileting task (3/3 steps) with assistance level: Supervision/Verbal cueing   Problem: RH Functional Use of Upper Extremity Goal: LTG Patient will use RT/LT upper extremity as a (OT) Description: LTG: Patient will use right/left upper extremity as a stabilizer/gross assist/diminished/nondominant/dominant level with assist, with/without cues during functional activity (OT) Flowsheets (Taken 01/13/2025 1222) LTG: Use of upper extremity in functional activities:  RUE as dominant level  LUE as nondominant level   Problem: RH Simple Meal Prep Goal: LTG Patient will perform simple meal prep w/assist (OT) Description: LTG: Patient will perform simple meal prep with assistance, with/without cues (OT). Flowsheets (Taken 01/13/2025 1222) LTG: Pt will perform simple meal prep with assistance level of: Contact Guard/Touching assist LTG: Pt will perform simple meal prep w/level of: Ambulate with device   Problem: RH Toilet Transfers Goal: LTG Patient will perform toilet transfers w/assist (OT) Description: LTG: Patient will perform toilet transfers with assist, with/without cues using equipment (OT) Flowsheets (Taken 01/13/2025 1222) LTG: Pt will perform toilet transfers with assistance level of: Supervision/Verbal cueing   Problem: RH Tub/Shower Transfers Goal: LTG Patient will perform tub/shower transfers w/assist (OT) Description: LTG: Patient will perform tub/shower transfers with assist, with/without cues using equipment (OT) Flowsheets (Taken 01/13/2025 1222) LTG: Pt will perform tub/shower stall transfers with assistance level of: Supervision/Verbal cueing LTG: Pt will perform tub/shower  transfers from:  Tub/shower combination  Walk in shower   "

## 2025-01-13 NOTE — Plan of Care (Signed)
" °  Problem: Consults Goal: RH SPINAL CORD INJURY PATIENT EDUCATION Description:  See Patient Education module for education specifics.  Outcome: Progressing   Problem: SCI BOWEL ELIMINATION Goal: RH STG MANAGE BOWEL WITH ASSISTANCE Description: STG Manage Bowel with bowel program min Assistance. Outcome: Progressing   Problem: SCI BLADDER ELIMINATION Goal: RH STG MANAGE BLADDER WITH ASSISTANCE Description: STG Manage Bladder With bladder program min Assistance Outcome: Progressing   Problem: RH SKIN INTEGRITY Goal: RH STG SKIN FREE OF INFECTION/BREAKDOWN Description: Manage skin free of infection with min assistance Outcome: Progressing   Problem: RH SAFETY Goal: RH STG ADHERE TO SAFETY PRECAUTIONS W/ASSISTANCE/DEVICE Description: STG Adhere to Safety Precautions With min Assistance/Device. Outcome: Progressing   Problem: RH PAIN MANAGEMENT Goal: RH STG PAIN MANAGED AT OR BELOW PT'S PAIN GOAL Description: <4 w/ prns Outcome: Progressing   Problem: RH KNOWLEDGE DEFICIT SCI Goal: RH STG INCREASE KNOWLEDGE OF SELF CARE AFTER SCI Description: Manage increase knowledge of self care with min assistance from spouse using educational materials provided Outcome: Progressing   "

## 2025-01-13 NOTE — Progress Notes (Signed)
 Inpatient Rehabilitation  Patient information reviewed and entered into eRehab system by [facility]. Karen Kays., CCC/SLP, PPS Coordinator.  Information including medical coding, functional ability and quality indicators will be reviewed and updated through discharge.

## 2025-01-13 NOTE — Progress Notes (Signed)
 " Inpatient Rehabilitation Care Coordinator Assessment and Plan Patient Details  Name: April Warren MRN: [id number] Date of Birth: 1971/08/12  Today's Date: 01/13/2025  Hospital Problems: Principal Problem:   Acute incomplete quadriplegia (HCC) Active Problems:   Spinal cord injury, cervical region [facility])  Past Medical History:  Past Medical History:  Diagnosis Date   Allergy    Asthma    Certain time of the year   Chicken pox 54 yrs old   Diabetes mellitus 08/12/2012   type 2- dr alvia diagnosed   Diabetic retinopathy (HCC)    Dysmenorrhea 08/20/2012   HTN (hypertension) 08/20/2012   Hyperlipidemia, mixed 08/28/2013   Neuromuscular disorder (HCC)    Not sure of date   Neuropathy 02/16/2017   Obesity    Obesity, unspecified 08/28/2013   Other and unspecified hyperlipidemia 08/28/2013   Preventative health care 08/20/2012   Sinusitis 11/14/2013   Sinusitis, acute 05/02/2016   Past Surgical History:  Past Surgical History:  Procedure Laterality Date   BREAST BIOPSY Right 01/29/2024   US  RT BREAST BX W LOC DEV 1ST LESION IMG BX SPEC US  GUIDE 01/29/2024 [facility] MAMMOGRAPHY   EYE SURGERY     Laser surgery for bleeds   REFRACTIVE SURGERY  08/12/2012   right, left on 09/16/12   Social History:  reports that she has never smoked. She has never used smokeless tobacco. She reports current alcohol  use. She reports that she does not use drugs.  Family / Support Systems Marital Status: Married How Long?: 18 years Patient Roles: Spouse, Parent Spouse/Significant Other: Lynwood (husband) Children: Blended family. 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 pt and husband do not have any children together. Pt [place] but they will not be helping at discharge. Other Supports: None Anticipated Caregiver: Husband Ability/Limitations of Caregiver: Husband is able to provide limited physical support as he is a retired  cytogeneticist. Caregiver Availability: 24/7 Family Dynamics: Pt lives with husband and five foster children.  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, Manufacturing - How often do you need to have someone help you when you read instructions, pamphlets, or other written material from your doctor or pharmacy?: Never Writes: Yes Employment Status: Unemployed Marine Scientist Issues: Denies Guardian/Conservator: N.A   Abuse/Neglect Abuse/Neglect Assessment Can Be Completed: Yes Physical Abuse: Denies Verbal Abuse: Denies Sexual Abuse: Denies Exploitation of patient/patient's resources: Denies Self-Neglect: Denies  Patient response to: Social Isolation - How often do you feel lonely or isolated from those around you?: Never  Emotional Status Pt's affect, behavior and adjustment status: Pt in good spirits at time of visit Recent Psychosocial Issues: Denies Psychiatric History: Denie Substance Abuse History: pt admits to occassional etoh use. Denies tobacco or red drug use.  Patient / Family Perceptions, Expectations & Goals Pt/Family understanding of illness & functional limitations: Pt has general understanding of care needs Premorbid pt/family roles/activities: Independent Anticipated changes in roles/activities/participation: Assistance with ADLs/IADLs Pt/family expectations/goals: Pt goal is to work on getting back toherself, being independent and getting back to her family.  Community Resources [employer]: None Premorbid Home Care/DME Agencies: None Transportation available at discharge: Husband Is the patient able to respond to transportation needs?: Yes In the past 12 months, has lack of transportation kept you from medical appointments or from getting medications?: No In the past 12 months, has lack of transportation kept you from meetings, work, or from getting  things needed for daily living?: No Resource referrals recommended: Neuropsychology  Discharge Planning Living Arrangements: Spouse/significant other Support Systems: Spouse/significant other, Children Type of Residence: Private residence Insurance Resources: Media Planner (specify) Biomedical Engineer) Financial Resources: Family Support Financial Screen Referred: No Living Expenses: Banker Management: Spouse Does the patient have any problems obtaining your medications?: No Home Management: Pt manages all home care needs Patient/Family Preliminary Plans: TBD Care Coordinator Barriers to Discharge: Decreased caregiver support, Lack of/limited family support, Insurance for SNF coverage Care Coordinator Anticipated Follow Up Needs: HH/OP  Clinical Impression SW met with pt in room to introduce self, explain role, and discussed discharge process. Pt is not a cytogeneticist. No HCPOA. DME- w/c (husband purchased), cane and husband intends to order shower chair. Pt aware SW will follow-up with her husband.   Contina Strain A Miri Jose 01/13/2025, 2:13 PM    "

## 2025-01-13 NOTE — Progress Notes (Signed)
 BLE venous duplex has been completed.   Results can be found under chart review under CV PROC. 01/13/2025 7:39 PM Othell Jaime RVT, RDMS

## 2025-01-14 DIAGNOSIS — G8250 Quadriplegia, unspecified: Secondary | ICD-10-CM | POA: Diagnosis not present

## 2025-01-14 DIAGNOSIS — R739 Hyperglycemia, unspecified: Secondary | ICD-10-CM | POA: Diagnosis not present

## 2025-01-14 DIAGNOSIS — I10 Essential (primary) hypertension: Secondary | ICD-10-CM | POA: Diagnosis not present

## 2025-01-14 DIAGNOSIS — K5901 Slow transit constipation: Secondary | ICD-10-CM | POA: Diagnosis not present

## 2025-01-14 DIAGNOSIS — K592 Neurogenic bowel, not elsewhere classified: Secondary | ICD-10-CM | POA: Diagnosis not present

## 2025-01-14 LAB — GLUCOSE, CAPILLARY
Glucose-Capillary: 156 mg/dL — ABNORMAL HIGH (ref 70–99)
Glucose-Capillary: 267 mg/dL — ABNORMAL HIGH (ref 70–99)
Glucose-Capillary: 367 mg/dL — ABNORMAL HIGH (ref 70–99)
Glucose-Capillary: 319 mg/dL — ABNORMAL HIGH (ref 70–99)

## 2025-01-14 LAB — URINALYSIS, ROUTINE W REFLEX MICROSCOPIC
Bacteria, UA: NONE SEEN
Bilirubin Urine: NEGATIVE
Glucose, UA: >=500 mg/dL — AB
Hgb urine dipstick: NEGATIVE
Ketones, ur: NEGATIVE mg/dL
Nitrite: NEGATIVE
Protein, ur: NEGATIVE mg/dL
Specific Gravity, Urine: 1.02 (ref 1.005–1.030)
pH: 5 (ref 5.0–8.0)

## 2025-01-14 MED ORDER — INSULIN GLARGINE 100 UNIT/ML ~~LOC~~ SOLN
35.0000 [IU] | Freq: Every day | SUBCUTANEOUS | Status: DC
  Administered 2025-01-15 – 2025-01-20 (×6): 35 [IU] via SUBCUTANEOUS
  Filled 2025-01-14 (×7): qty 0.35

## 2025-01-14 MED ORDER — BISACODYL 10 MG RE SUPP
10.0000 mg | Freq: Every day | RECTAL | Status: DC
  Administered 2025-01-14 – 2025-01-22 (×7): 10 mg via RECTAL
  Filled 2025-01-14 (×9): qty 1

## 2025-01-14 NOTE — Plan of Care (Signed)
" °  Problem: SCI BOWEL ELIMINATION Goal: RH STG MANAGE BOWEL WITH ASSISTANCE Description: STG Manage Bowel with bowel program min Assistance. Outcome: Progressing   Problem: RH SAFETY Goal: RH STG ADHERE TO SAFETY PRECAUTIONS W/ASSISTANCE/DEVICE Description: STG Adhere to Safety Precautions With min Assistance/Device. Outcome: Progressing   "

## 2025-01-14 NOTE — Progress Notes (Signed)
 "                                                        PROGRESS NOTE   Subjective/Complaints:  Pt doing well, slept well, pain well managed and doing better. LBM Thurs 2 days ago states she didn't get the bowel program yesterday because the nurse said it wasn't ordered-- looks like the suppository fell off the orders, replaced it now. Advised to use sorbitol  today at dinnertime so she can then have the bowel program after that and hopefully get results. Urinating well. No other complaints or concerns.   ROS:  Pt denies SOB, abd pain, CP, N/V/D, and vision changes Per HPI  Objective:   VAS US  LOWER EXTREMITY VENOUS (DVT) Result Date: 01/13/2025  Lower Venous DVT Study Patient Name:  April Warren  Date of Exam:   01/13/2025 Medical Rec #: [id number]    Accession #:    [id number] Date of Birth: 09-Jan-1971    Patient Gender: F Patient Age:  54 years Exam Location:  [facility] Procedure:      VAS US  LOWER EXTREMITY VENOUS (DVT) Referring Phys: DAPHNE SATTERFIELD --------------------------------------------------------------------------------  Indications: Immobility. Other Indications: Rehab patient. Risk Factors: Spinal cord injury - acute incomplete quadriplegia. Limitations: Poor ultrasound/tissue interface. Comparison Study: No previous exams Performing Technologist: Jody Hill RVT, RDMS  Examination Guidelines: A complete evaluation includes B-mode imaging, spectral Doppler, color Doppler, and power Doppler as needed of all accessible portions of each vessel. Bilateral testing is considered an integral part of a complete examination. Limited examinations for reoccurring indications may be performed as noted. The reflux portion of the exam is performed with the patient in reverse Trendelenburg.  +---------+---------------+---------+-----------+----------+--------------+ RIGHT    CompressibilityPhasicitySpontaneityPropertiesThrombus Aging  +---------+---------------+---------+-----------+----------+--------------+ CFV      Full           Yes      Yes                                 +---------+---------------+---------+-----------+----------+--------------+ SFJ      Full                                                        +---------+---------------+---------+-----------+----------+--------------+ FV Prox  Full           Yes      Yes                                 +---------+---------------+---------+-----------+----------+--------------+ FV Mid   Full           Yes      Yes                                 +---------+---------------+---------+-----------+----------+--------------+ FV DistalFull           Yes      Yes                                 +---------+---------------+---------+-----------+----------+--------------+  PFV      Full                                                        +---------+---------------+---------+-----------+----------+--------------+ POP      Full           Yes      Yes                                 +---------+---------------+---------+-----------+----------+--------------+ PTV      Full                                                        +---------+---------------+---------+-----------+----------+--------------+ PERO     Full                                                        +---------+---------------+---------+-----------+----------+--------------+   +---------+---------------+---------+-----------+----------+--------------+ LEFT     CompressibilityPhasicitySpontaneityPropertiesThrombus Aging +---------+---------------+---------+-----------+----------+--------------+ CFV      Full           Yes      Yes                                 +---------+---------------+---------+-----------+----------+--------------+ SFJ      Full                                                         +---------+---------------+---------+-----------+----------+--------------+ FV Prox  Full           Yes      Yes                                 +---------+---------------+---------+-----------+----------+--------------+ FV Mid   Full           Yes      Yes                                 +---------+---------------+---------+-----------+----------+--------------+ FV DistalFull           Yes      Yes                                 +---------+---------------+---------+-----------+----------+--------------+ PFV      Full                                                        +---------+---------------+---------+-----------+----------+--------------+  POP      Full           Yes      Yes                                 +---------+---------------+---------+-----------+----------+--------------+ PTV      Full                                                        +---------+---------------+---------+-----------+----------+--------------+ PERO     Full                                                        +---------+---------------+---------+-----------+----------+--------------+     Summary: BILATERAL: - No evidence of deep vein thrombosis seen in the lower extremities, bilaterally. -No evidence of popliteal cyst, bilaterally.   *See table(s) above for measurements and observations. Electronically signed by Gaile New MD on 01/13/2025 at 10:34:11 PM.    Final    Recent Labs    01/13/25 0542  WBC 11.2*  HGB 12.4  HCT 36.2  PLT 273   Recent Labs    01/13/25 0542  NA 132*  K 4.4  CL 97*  CO2 25  GLUCOSE 177*  BUN 30*  CREATININE 0.80  CALCIUM  9.4    Intake/Output Summary (Last 24 hours) at 01/14/2025 1130 Last data filed at 01/14/2025 0830 Gross per 24 hour  Intake 720 ml  Output --  Net 720 ml        Physical Exam: Vital Signs Blood pressure 124/68, pulse 60, temperature 97.6 F (36.4 C), temperature source Oral, resp. rate 16, height 5'  9 (1.753 m), weight 115.2 kg, last menstrual period 10/28/2014, SpO2 97%.    General: awake, alert, appropriate, resting comfortably in bed; NAD HENT: conjugate gaze; oropharynx moist CV: regular rhythm; borderline bradycardic  rate; no JVD Pulmonary: CTA B/L; no W/R/R- good air movement GI: soft, NT, ND, (+)BS- normoactive surprisingly Psychiatric: appropriate- interactive Neurological: Ox3  PRIOR EXAMS: Hoffman's B/L Musculoskeletal:     Comments: RUE- biceps 4+/5; WE 4+ to 5-/5; Triceps 4+/5; Grip 4/5; FA 3/5 LUE- biceps 4/5; WE 5-/5; Triceps 4/5; Grip 3+/5; and FA 2/5 RLE- HF 4+/5; KE, DF 4/5; PF 5-/5 and EHL 5-/5 LLE- HF 4-/5; otherwise 4/5 throughout  Skin:    General: Skin is warm and dry.     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 B/L Brisk Hoffman's B/L in Ue's DTR's reduced to 2+ in Ue's and LE's No clonus at wrist or LE's  Assessment/Plan: 1. Functional deficits which require 3+ hours per day of interdisciplinary therapy in a comprehensive inpatient rehab setting. Physiatrist is providing close team supervision and 24 hour management of active medical problems listed below. Physiatrist and rehab team continue to assess barriers to discharge/monitor patient progress toward functional and medical goals  Care Tool:  Bathing  Body parts bathed by patient: Right arm, Left arm, Chest, Right upper leg, Left upper leg, Face   Body parts bathed by helper: Abdomen, Front perineal area, Buttocks, Right lower leg, Left lower leg     Bathing assist Assist Level: Maximal Assistance - Patient 24 - 49%     Upper Body Dressing/Undressing Upper body dressing   What is the patient wearing?: Hospital gown only    Upper body assist Assist Level: Minimal Assistance - Patient >  75%    Lower Body Dressing/Undressing Lower body dressing      What is the patient wearing?: Pants     Lower body assist Assist for lower body dressing: Maximal Assistance - Patient 25 - 49%     Toileting Toileting    Toileting assist Assist for toileting: Moderate Assistance - Patient 50 - 74%     Transfers Chair/bed transfer  Transfers assist     Chair/bed transfer assist level: 2 Helpers     Locomotion Ambulation   Ambulation assist   Ambulation activity did not occur: Safety/medical concerns  Assist level: 2 helpers Assistive device: Walker-rolling Max distance: 11.5 ft   Walk 10 feet activity   Assist  Walk 10 feet activity did not occur: Safety/medical concerns  Assist level: 2 helpers Assistive device: Walker-rolling   Walk 50 feet activity   Assist Walk 50 feet with 2 turns activity did not occur: Safety/medical concerns         Walk 150 feet activity   Assist Walk 150 feet activity did not occur: Safety/medical concerns         Walk 10 feet on uneven surface  activity   Assist Walk 10 feet on uneven surfaces activity did not occur: Safety/medical concerns         Wheelchair     Assist Is the patient using a wheelchair?: Yes Type of Wheelchair: Manual    Wheelchair assist level: Dependent - Patient 0% Max wheelchair distance: 5 ft    Wheelchair 50 feet with 2 turns activity    Assist    Wheelchair 50 feet with 2 turns activity did not occur: Safety/medical concerns       Wheelchair 150 feet activity     Assist  Wheelchair 150 feet activity did not occur: Safety/medical concerns       Blood pressure 124/68, pulse 60, temperature 97.6 F (36.4 C), temperature source Oral, resp. rate 16, height 5' 9 (1.753 m), weight 115.2 kg, last menstrual period 10/28/2014, SpO2 97%.  Medical Problem List and Plan: 1. 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  Antithrombotics: -DVT/anticoagulation:  Mechanical:  Antiembolism stockings, knee (TED hose) Bilateral lower extremities-- 01/14/25 DVT study neg -antiplatelet therapy: per Dr. Joshua wait at least 5 days for DVT prophylaxis.            3. Pain Management: Baclofen  5 mg BID. Oxycodone  and robaxin  PRN.  1/16- will increase Oxy to 10-15 mg q3 hours prn- since pain only went to 8/10 with pain meds- usually 9 or higher when meds wear off. -improved 1/17  4. Mood/Behavior/Sleep: LCSW to follow for evaluation and support when available.              -antipsychotic agents: Ativan  0.5 mg bid prn   5. Neuropsych/cognition: This patient is capable of making decisions on her own behalf.  6. Skin/Wound Care: Routine pressure relief measures. Monitor incision sites for signs of infection. Hemo Vac removed 1/15.   7. Fluids/Electrolytes/Nutrition: Monitor I&O and weight. Follow up labs CBC/CMP                -Carb modified diet with supplements to continue.   8. Cervical spinal stenosis: s/p laminectomy by Dr. Joshua 1/12. NSGRY following, continue on decadron  taper.   9. DM2: A1c 7.1. Holding home metformin , glipizide  and januvia .   -Hyperglycemia in the setting of steroids. Monitor glucose ac/hs with SSI Novolog , Lantus  30 units daily. Novolog  7U with meals scheduled as well.  1/16- CBG's running 163 to 284- instead of increasing lantus , will wean Decadron  and that should help -01/14/25 CBGs still pretty high, pt agreeable with increasing Lantus  to 35U daily (starting tomorrow) until she's tapered further down on decadron . Discussed diet mods as well.   CBG (last 3)  Recent Labs    01/13/25 2043 01/14/25 0612 01/14/25 1132  GLUCAP 253* 156*  267*    10.CKD IIIa: Stable, baseline range 1.1-1.5. Monitor BMP.   1/16- Cr 0.8 and BUN 30- is dry- will push fluids- and recheck Monday  11. Acute bronchitis: CT from 1/1 without acute intrathoracic pathology. Completed 10 day course of doxycycline  1/15.               -encourage ins spiro.   12. HLD: Lipitor  80 mg   13. HTN: Holding metoprolol , lisinopril , triamterene -HCTZ. Monitor bp per protocol for need to resume.    1/16- BP is more elevated than had been running- will restart Lisinopril  5mg  daily- will NOT restart B blocker due to bradycardia-  -01/14/25 BPs actually pretty good now that pain better controlled. Monitor.   Vitals:   01/12/25 1353 01/12/25 1353 01/12/25 1941 01/13/25 0352  BP: (!) 154/78 (!) 154/78 (!) 157/80 (!) 147/64   01/13/25 1445 01/13/25 1953 01/14/25 0448  BP: (!) 128/90 122/65 124/68    14. Neurogenic bladder: Foley removed on 1/14  monitor PVRs d/t potential for urinary retention.  1/16- PVR x1 done- of zero- will d/w nurse and see if can get more before d/c'd in 3 days hopefully.   15. Neurogenic bowel: Continue Senna bid.  -Will give Sorbitol  45 cc x1 this afternoon- if no BM today, will give 60cc tomorrow and SSE. -1/16- had huge BM on toilet- but still feels like could go- will increase Senna to 2 tabs BID since increasing bowel meds- and keep on Sorbitol  prn if needed- d/c'd Bowel program for now since went on toilet- but might need to come back if starts having incontinence.  -01/14/25 LBM 1/15, pt stated no bowel program yesterday. Will restart suppository, continue bowel program, advised pt to use sorbitol  today before dinner as well to encourage better output tonight with program. Could see if she can d/c bowel program again later.        LOS: 2 days A FACE TO FACE EVALUATION WAS PERFORMED  [address] 01/14/2025, 11:30 AM     "

## 2025-01-14 NOTE — Progress Notes (Signed)
 IP Rehab Bowel Program Documentation   Bowel Program Start time 1815  Dig Stim Indicated? Yes  Dig Stim Prior to Suppository or mini Enema X 5   Output from dig stim: Minimal  Ordered intervention: Suppository Yes , mini enema No ,   Repeat dig stim after Suppository or Mini enema  X 5,  Output? Minimal   Bowel Program Complete? No , handoff given yes  Patient Tolerated? Yes

## 2025-01-15 DIAGNOSIS — G8250 Quadriplegia, unspecified: Secondary | ICD-10-CM | POA: Diagnosis not present

## 2025-01-15 DIAGNOSIS — K5901 Slow transit constipation: Secondary | ICD-10-CM | POA: Diagnosis not present

## 2025-01-15 DIAGNOSIS — R739 Hyperglycemia, unspecified: Secondary | ICD-10-CM | POA: Diagnosis not present

## 2025-01-15 DIAGNOSIS — I10 Essential (primary) hypertension: Secondary | ICD-10-CM | POA: Diagnosis not present

## 2025-01-15 DIAGNOSIS — K592 Neurogenic bowel, not elsewhere classified: Secondary | ICD-10-CM

## 2025-01-15 LAB — GLUCOSE, CAPILLARY
Glucose-Capillary: 169 mg/dL — ABNORMAL HIGH (ref 70–99)
Glucose-Capillary: 244 mg/dL — ABNORMAL HIGH (ref 70–99)
Glucose-Capillary: 213 mg/dL — ABNORMAL HIGH (ref 70–99)
Glucose-Capillary: 243 mg/dL — ABNORMAL HIGH (ref 70–99)

## 2025-01-15 NOTE — Progress Notes (Signed)
 Occupational Therapy Session Note  Patient Details  Name: April Warren MRN: [id number] Date of Birth: 01-30-1971  Today's Date: 01/15/2025 OT Individual Time: 8984-8884 OT Individual Time Calculation (min): 60 min    Short Term Goals: Week 1:  OT Short Term Goal 1 (Week 1): Patient to perform LE dressing with Min assist OT Short Term Goal 2 (Week 1): Patient to perform tub bench transfer with Mod Assist OT Short Term Goal 3 (Week 1): Patient to increase AROM into R shoulder flexion to 150 degrees OT Short Term Goal 4 (Week 1): Patient to perform toileting with Min assist  Skilled Therapeutic Interventions/Progress Updates:      Therapy Documentation Precautions:  Precautions Precautions: Fall Recall of Precautions/Restrictions: Intact Precaution/Restrictions Comments: cervical Restrictions Weight Bearing Restrictions Per Provider Order: No General: Pt seated in W/C upon OT arrival, agreeable to OT.  Pain:  5/10 pain reported in Rt shoulder, activity, intermittent rest breaks, distractions provided for pain management, pt reports tolerable to proceed.   ADL: OT providing skilled intervention on ADL retraining in order to increase independence with tasks and increase activity tolerance. Pt completed the following tasks at the current level of assist: Bed mobility: SBA EOB>supine with use of bed rails to adjust once in bed  LB dressing: CGA in standing at RW to manage over waist, otherwise pt completing task with reacher to don over feet at SBA Transfers: Min A stand step transfer from W/C>EOB with RW no LOB/SOB  Exercises: Pt completed 11 minutes of arm bike, 4 min forward, 5 min backward in order to increase BUE/BLEstrength and endurance in preparation for increased independence in ADLs. Occasional brief rest break, on level 1 resistance.  Other Treatments: Pt participated in [facility] activity with BUE, pt retrieving beads within theraputty with BUE and placing them into container for  increased dexterity and coordination in order to complete ADLs. OT also issuing foam tubing for utensils for increased grip.    Pt seated in W/C at end of session with W/C alarm donned, call light within reach and 4Ps assessed.    Therapy/Group: Individual Therapy  Camie Hoe, OTD, OTR/L 01/15/2025, 12:43 PM

## 2025-01-15 NOTE — Progress Notes (Signed)
 Patient refused bowel program and stated that she understood that she needs it but did not want it tonight. Patient educated to use of narcotic pain meds and having a regular bowel movement. Patient stated understanding. Fredie LITTIE Morones, RN

## 2025-01-15 NOTE — Progress Notes (Signed)
 Physical Therapy Session Note  Patient Details  Name: April Warren MRN: [id number] Date of Birth: 12-21-1971  Today's Date: 01/15/2025 PT Individual Time: 9194-9084 and 1415-1530 PT Individual Time Calculation (min): 70 min and 75 min  Short Term Goals: Week 1:  PT Short Term Goal 1 (Week 1): pt will stand >1 min without knee buckling PT Short Term Goal 2 (Week 1): pt will ambulate 20 ft PT Short Term Goal 3 (Week 1): Pt will perform bed mobility with min a  Skilled Therapeutic Interventions/Progress Updates: Pt presented in bed agreeable to therapy. Pt c/o pain back/shoulder unrated, received pain meds during session. Pt completed supine to sit with modA and use of bed features. Pt requesting to use toilet, completed Stedy transfer to toilet with pt requiring minA to stand from EOB and CGA to stand from [place] (+ urinary void). PTA providing total A for peri-care. Pt transferred to w/c with Stedy. In w/c performed oral hygiene mod I. Pt transported to day room for energy conservation. Participated in Cybex Kinetron x 3 min at 40cm/sec for reciprocal movement and general conditioning. Pt then completed stand step transfer to mat with minA. Performed Sit to stand x 5 with minA fading to CGA for increased BLE ms recruitment. Pt required cues to minimize pushing against mat with BLE. Performed toe taps to 4in step alternating 2 x 5. Pt also participated in standing balance task placing clothespins on basketball net with RUE with pt able to x 1 performing bimanual task changing hands to hold horseshoe. Pt then completed ambulatory transfer to w/c and transported back to room. Pt requesting to use bathroom at end of session. Completed stand step transfer with minA to toilet and provided total A for clothing management. Pt left at toilet at end of session with pt aware to pull cord once completed and NT notified of pt's disposition.   Tx2: Pt presented in bed agreeable to therapy. Pt states pain 6/10 neck and  shoulder, premedicated. Pt required modA for bed mobility and modA to scoot to EOB. Performed stand step with RW to w/c with minA. Pt transported to day room and participated in gait training with w/c follow with minA fading to CGA. Pt ambulated distances of 45, 68, and 10ft respectively. Noted that with fatigue some increased knee instability and genu recurvatum  however no LOB. Discussed importance on rest breaks to allow for improved activity tolerance. Pt then participated in standing balance activity performing ball taps with single and no UE support. Pt was able to perform 3 rounds of 2 min each with CGA. Pt demonstrated improved ankle strategy in standing as well. Pt then ambulated to NuStep with minA due to fatigue and increased knee instability and participated on trails course L3 x 8 min all extremities avg 40 SPM for general conditioning then completing x 2 min of BLE only at L2. Pt then completed stand step transfer to w/c wth CGA and transported back to room. In room pt requesting to use restroom and completed stand step in same manner to toilet and pt was able to manage LB clothing with CGA. Pt left on toilet at end of session with pt acknowledging to use pull cord once completed and NT notified of pt's disposition.      Therapy Documentation Precautions:  Precautions Precautions: Fall Recall of Precautions/Restrictions: Intact Precaution/Restrictions Comments: cervical Restrictions Weight Bearing Restrictions Per Provider Order: No General:     Therapy/Group: Individual Therapy  Mariza Bourget 01/15/2025, 12:57 PM

## 2025-01-15 NOTE — Progress Notes (Signed)
 "                                                        PROGRESS NOTE   Subjective/Complaints:  Pt doing well again, but slept poorly d/t having BMs. Did feel that the bowel program was beneficial, had good BMs x2.  Pain well managed. Urinating well. No other complaints or concerns.   ROS:  Pt denies SOB, abd pain, CP, N/V/D, and vision changes Per HPI  Objective:   VAS US  LOWER EXTREMITY VENOUS (DVT) Result Date: 01/13/2025  Lower Venous DVT Study Patient Name:  April Warren  Date of Exam:   01/13/2025 Medical Rec #: [id number]    Accession #:    [id number] Date of Birth: 02-Aug-1971    Patient Gender: F Patient Age:   54 years Exam Location:  [facility] Procedure:      VAS US  LOWER EXTREMITY VENOUS (DVT) Referring Phys: DAPHNE SATTERFIELD --------------------------------------------------------------------------------  Indications: Immobility. Other Indications: Rehab patient. Risk Factors: Spinal cord injury - acute incomplete quadriplegia. Limitations: Poor ultrasound/tissue interface. Comparison Study: No previous exams Performing Technologist: Jody Hill RVT, RDMS  Examination Guidelines: A complete evaluation includes B-mode imaging, spectral Doppler, color Doppler, and power Doppler as needed of all accessible portions of each vessel. Bilateral testing is considered an integral part of a complete examination. Limited examinations for reoccurring indications may be performed as noted. The reflux portion of the exam is performed with the patient in reverse Trendelenburg.  +---------+---------------+---------+-----------+----------+--------------+ RIGHT    CompressibilityPhasicitySpontaneityPropertiesThrombus Aging +---------+---------------+---------+-----------+----------+--------------+ CFV      Full           Yes      Yes                                 +---------+---------------+---------+-----------+----------+--------------+ SFJ      Full                                                         +---------+---------------+---------+-----------+----------+--------------+ FV Prox  Full           Yes      Yes                                 +---------+---------------+---------+-----------+----------+--------------+ FV Mid   Full           Yes      Yes                                 +---------+---------------+---------+-----------+----------+--------------+ FV DistalFull           Yes      Yes                                 +---------+---------------+---------+-----------+----------+--------------+ PFV      Full                                                        +---------+---------------+---------+-----------+----------+--------------+  POP      Full           Yes      Yes                                 +---------+---------------+---------+-----------+----------+--------------+ PTV      Full                                                        +---------+---------------+---------+-----------+----------+--------------+ PERO     Full                                                        +---------+---------------+---------+-----------+----------+--------------+   +---------+---------------+---------+-----------+----------+--------------+ LEFT     CompressibilityPhasicitySpontaneityPropertiesThrombus Aging +---------+---------------+---------+-----------+----------+--------------+ CFV      Full           Yes      Yes                                 +---------+---------------+---------+-----------+----------+--------------+ SFJ      Full                                                        +---------+---------------+---------+-----------+----------+--------------+ FV Prox  Full           Yes      Yes                                 +---------+---------------+---------+-----------+----------+--------------+ FV Mid   Full           Yes      Yes                                  +---------+---------------+---------+-----------+----------+--------------+ FV DistalFull           Yes      Yes                                 +---------+---------------+---------+-----------+----------+--------------+ PFV      Full                                                        +---------+---------------+---------+-----------+----------+--------------+ POP      Full           Yes      Yes                                 +---------+---------------+---------+-----------+----------+--------------+ PTV  Full                                                        +---------+---------------+---------+-----------+----------+--------------+ PERO     Full                                                        +---------+---------------+---------+-----------+----------+--------------+     Summary: BILATERAL: - No evidence of deep vein thrombosis seen in the lower extremities, bilaterally. -No evidence of popliteal cyst, bilaterally.   *See table(s) above for measurements and observations. Electronically signed by Gaile New MD on 01/13/2025 at 10:34:11 PM.    Final    Recent Labs    01/13/25 0542  WBC 11.2*  HGB 12.4  HCT 36.2  PLT 273   Recent Labs    01/13/25 0542  NA 132*  K 4.4  CL 97*  CO2 25  GLUCOSE 177*  BUN 30*  CREATININE 0.80  CALCIUM  9.4    Intake/Output Summary (Last 24 hours) at 01/15/2025 1202 Last data filed at 01/14/2025 2100 Gross per 24 hour  Intake 660 ml  Output --  Net 660 ml        Physical Exam: Vital Signs Blood pressure 130/74, pulse 64, temperature 97.8 F (36.6 C), temperature source Oral, resp. rate 16, height 5' 9 (1.753 m), weight 115.2 kg, last menstrual period 10/28/2014, SpO2 100%.    General: awake, alert, appropriate, sitting up in w/c; NAD HENT: conjugate gaze; oropharynx moist CV: regular rhythm; borderline bradycardic  rate; no JVD Pulmonary: CTA B/L; no W/R/R- good air movement GI: soft, NT,  ND, (+)BS- normoactive Psychiatric: appropriate- interactive Neurological: Ox3 Skin: posterior neck incision with surgical dressing c/d/i  PRIOR EXAMS: Hoffman's B/L Musculoskeletal:     Comments: RUE- biceps 4+/5; WE 4+ to 5-/5; Triceps 4+/5; Grip 4/5; FA 3/5 LUE- biceps 4/5; WE 5-/5; Triceps 4/5; Grip 3+/5; and FA 2/5 RLE- HF 4+/5; KE, DF 4/5; PF 5-/5 and EHL 5-/5 LLE- HF 4-/5; otherwise 4/5 throughout  Skin:    General: Skin is warm and dry.     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 B/L Brisk Hoffman's B/L in Ue's DTR's reduced to 2+ in Ue's and LE's No clonus at wrist or LE's  Assessment/Plan: 1. Functional deficits which require 3+ hours per day of interdisciplinary therapy in a comprehensive inpatient rehab setting. Physiatrist is providing close team supervision and 24 hour management of active medical problems listed below. Physiatrist and rehab team continue to assess barriers to discharge/monitor patient progress toward functional and medical goals  Care Tool:  Bathing    Body parts bathed by patient: Right arm, Left arm, Chest, Right upper leg, Left upper leg, Face   Body parts bathed by helper: Abdomen, Front perineal area, Buttocks, Right lower leg, Left lower leg     Bathing assist Assist Level: Maximal Assistance - Patient 24 - 49%     Upper Body  Dressing/Undressing Upper body dressing   What is the patient wearing?: Hospital gown only    Upper body assist Assist Level: Minimal Assistance - Patient > 75%    Lower Body Dressing/Undressing Lower body dressing      What is the patient wearing?: Pants     Lower body assist Assist for lower body dressing: Maximal Assistance - Patient 25 - 49%     Toileting Toileting    Toileting assist Assist for  toileting: Moderate Assistance - Patient 50 - 74%     Transfers Chair/bed transfer  Transfers assist     Chair/bed transfer assist level: 2 Helpers     Locomotion Ambulation   Ambulation assist   Ambulation activity did not occur: Safety/medical concerns  Assist level: 2 helpers Assistive device: Walker-rolling Max distance: 11.5 ft   Walk 10 feet activity   Assist  Walk 10 feet activity did not occur: Safety/medical concerns  Assist level: 2 helpers Assistive device: Walker-rolling   Walk 50 feet activity   Assist Walk 50 feet with 2 turns activity did not occur: Safety/medical concerns         Walk 150 feet activity   Assist Walk 150 feet activity did not occur: Safety/medical concerns         Walk 10 feet on uneven surface  activity   Assist Walk 10 feet on uneven surfaces activity did not occur: Safety/medical concerns         Wheelchair     Assist Is the patient using a wheelchair?: Yes Type of Wheelchair: Manual    Wheelchair assist level: Dependent - Patient 0% Max wheelchair distance: 5 ft    Wheelchair 50 feet with 2 turns activity    Assist    Wheelchair 50 feet with 2 turns activity did not occur: Safety/medical concerns       Wheelchair 150 feet activity     Assist  Wheelchair 150 feet activity did not occur: Safety/medical concerns       Blood pressure 130/74, pulse 64, temperature 97.8 F (36.6 C), temperature source Oral, resp. rate 16, height 5' 9 (1.753 m), weight 115.2 kg, last menstrual period 10/28/2014, SpO2 100%.  Medical Problem List and Plan: 1. 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  Antithrombotics: -DVT/anticoagulation:  Mechanical:  Antiembolism stockings, knee (TED hose) Bilateral lower extremities-- 01/14/25 DVT study neg -antiplatelet therapy: per Dr. Joshua wait at least 5 days for DVT prophylaxis.            3. Pain Management: Baclofen  5 mg BID. Oxycodone  and robaxin  PRN.  1/16- will increase Oxy to 10-15 mg q3 hours prn- since pain only went to 8/10 with pain meds- usually 9 or higher when meds wear off. -improved 1/17  4. Mood/Behavior/Sleep: LCSW to follow for evaluation and support when available.              -antipsychotic agents: Ativan  0.5 mg bid prn   5. Neuropsych/cognition: This patient is capable of making decisions on her own behalf.  6. Skin/Wound Care: Routine pressure relief measures. Monitor  incision sites for signs of infection. Hemo Vac removed 1/15.   7. Fluids/Electrolytes/Nutrition: Monitor I&O and weight. Follow up labs CBC/CMP                -Carb modified diet with supplements to continue.   8. Cervical spinal stenosis: s/p laminectomy by Dr. Joshua 1/12. NSGRY following, continue on decadron  taper.   9. DM2: A1c 7.1. Holding home metformin , glipizide  and januvia .   -Hyperglycemia in the setting of steroids. Monitor glucose ac/hs with SSI Novolog , Lantus  30 units daily. Novolog  7U with meals scheduled as well.  1/16- CBG's running 163 to 284- instead of increasing lantus , will wean Decadron  and that should help -01/14/25 CBGs still pretty high, pt agreeable with increasing Lantus  to 35U daily (starting tomorrow) until she's tapered further down on decadron . Discussed diet mods as well.  -01/15/25 higher lantus  dose starts today, monitor trend  CBG (last 3)  Recent Labs    01/14/25 2016 01/15/25 0559 01/15/25 1141  GLUCAP 319* 169* 244*    10.CKD IIIa: Stable, baseline range 1.1-1.5. Monitor BMP.   1/16- Cr 0.8 and BUN 30- is dry- will push fluids- and recheck Monday  11. Acute bronchitis: CT from 1/1 without acute  intrathoracic pathology. Completed 10 day course of doxycycline  1/15.               -encourage ins spiro.   12. HLD: Lipitor  80 mg   13. HTN: Holding metoprolol , lisinopril , triamterene -HCTZ. Monitor bp per protocol for need to resume.    1/16- BP is more elevated than had been running- will restart Lisinopril  5mg  daily- will NOT restart B blocker due to bradycardia-  -1/17-18/26 BPs actually pretty good now that pain better controlled. Monitor.   Vitals:   01/12/25 1353 01/12/25 1353 01/12/25 1941 01/13/25 0352  BP: (!) 154/78 (!) 154/78 (!) 157/80 (!) 147/64   01/13/25 1445 01/13/25 1953 01/14/25 0448 01/14/25 1337  BP: (!) 128/90 122/65 124/68 112/66   01/14/25 1857 01/15/25 0523 01/15/25 0851  BP: 137/70 130/74 130/74    14. Neurogenic bladder: Foley removed on 1/14  monitor PVRs d/t potential for urinary retention.  1/16- PVR x1 done- of zero- will d/w nurse and see if can get more before d/c'd in 3 days hopefully.   15. Neurogenic bowel: Continue Senna bid.  -Will give Sorbitol  45 cc x1 this afternoon- if no BM today, will give 60cc tomorrow and SSE. -1/16- had huge BM on toilet- but still feels like could go- will increase Senna to 2 tabs BID since increasing bowel meds- and keep on Sorbitol  prn if needed- d/c'd Bowel program for now since went on toilet- but might need to come back if starts having incontinence.  -01/14/25 LBM 1/15, pt stated no bowel program yesterday. Will restart suppository, continue bowel program, advised pt to use sorbitol  today before dinner as well to encourage better output tonight with program. Could see if she can d/c bowel program again later.  -01/15/25 good output with bowel program last night, was incontinent this morning-- leave bowel program for now, if starts having more regular continent BMs, might be able to consider stopping bowel program at a later time.        LOS: 3 days A FACE TO FACE EVALUATION WAS PERFORMED  [address] April Warren 01/15/2025, 12:02 PM     "

## 2025-01-15 NOTE — Plan of Care (Signed)
" °  Problem: Consults Goal: RH SPINAL CORD INJURY PATIENT EDUCATION Description:  See Patient Education module for education specifics.  Outcome: Progressing   Problem: SCI BOWEL ELIMINATION Goal: RH STG MANAGE BOWEL WITH ASSISTANCE Description: STG Manage Bowel with bowel program min Assistance. Outcome: Progressing   Problem: SCI BLADDER ELIMINATION Goal: RH STG MANAGE BLADDER WITH ASSISTANCE Description: STG Manage Bladder With bladder program min Assistance Outcome: Progressing   Problem: RH SKIN INTEGRITY Goal: RH STG SKIN FREE OF INFECTION/BREAKDOWN Description: Manage skin free of infection with min assistance Outcome: Progressing   Problem: RH SAFETY Goal: RH STG ADHERE TO SAFETY PRECAUTIONS W/ASSISTANCE/DEVICE Description: STG Adhere to Safety Precautions With min Assistance/Device. Outcome: Progressing   Problem: RH PAIN MANAGEMENT Goal: RH STG PAIN MANAGED AT OR BELOW PT'S PAIN GOAL Description: <4 w/ prns Outcome: Progressing   Problem: RH KNOWLEDGE DEFICIT SCI Goal: RH STG INCREASE KNOWLEDGE OF SELF CARE AFTER SCI Description: Manage increase knowledge of self care with min assistance from spouse using educational materials provided Outcome: Progressing   "

## 2025-01-15 NOTE — IPOC Note (Signed)
 Overall Plan of Care [facility]) Patient Details Name: April Warren MRN: [id number] DOB: 1971-09-01  Admitting Diagnosis: Acute incomplete quadriplegia [facility])  Hospital Problems: Principal Problem:   Acute incomplete quadriplegia (HCC) Active Problems:   Spinal cord injury, cervical region [facility])     Functional Problem List: Nursing Bladder, Bowel, Edema, Endurance, Medication Management, Pain, Safety, Sensory, Skin Integrity, Nutrition, Motor  PT Balance, Pain, Edema, Safety, Endurance, Sensory, Motor, Skin Integrity  OT Balance, Sensory, Endurance, Motor, Pain  SLP    TR         Basic ADLs: OT Eating, Grooming, Bathing, Dressing, Toileting     Advanced  ADLs: OT Simple Meal Preparation     Transfers: PT Bed Mobility, Bed to Chair, Banker, Toilet     Locomotion: PT Ambulation, Wheelchair Mobility     Additional Impairments: OT Fuctional Use of Upper Extremity  SLP        TR      Anticipated Outcomes Item Anticipated Outcome  Self Feeding Modified Independent  Swallowing      Basic self-care  Supervision  Toileting  Supervision   Bathroom Transfers Supervision  Bowel/Bladder  manage bowels with bowel program/ manage bladder with bladder program  Transfers  supervision with LRAD  Locomotion  CGA short distance gait, mod I w/c  Communication     Cognition     Pain  <4 w/ prns  Safety/Judgment  manage safety with min assistance   Therapy Plan: PT Intensity: Minimum of 1-2 x/day ,45 to 90 minutes PT Frequency: 5 out of 7 days PT Duration Estimated Length of Stay: 2.5-3 weeks OT Intensity: Minimum of 1-2 x/day, 45 to 90 minutes OT Frequency: 5 out of 7 days OT Duration/Estimated Length of Stay: 3-4 weeks     Team Interventions: Nursing Interventions Patient/Family Education, Skin Care/Wound Management, Bladder Management, Bowel Management, Disease Management/Prevention, Pain Management, Medication Management, Discharge Planning  PT  interventions Ambulation/gait training, Discharge planning, DME/adaptive equipment instruction, Functional mobility training, Pain management, Psychosocial support, Splinting/orthotics, Therapeutic Activities, UE/LE Strength taining/ROM, UE/LE Coordination activities, Wheelchair propulsion/positioning, Therapeutic Exercise, Stair training, Skin care/wound management, Patient/family education, Neuromuscular re-education, Functional electrical stimulation, Disease management/prevention, Warden/ranger, Community reintegration  OT Interventions Warden/ranger, Disease mangement/prevention, Neuromuscular re-education, Self Care/advanced ADL retraining, Therapeutic Exercise, UE/LE Strength taining/ROM, Pain management, DME/adaptive equipment instruction, Community reintegration, Patient/family education, Functional electrical stimulation, Splinting/orthotics, UE/LE Coordination activities, Discharge planning, Functional mobility training, Therapeutic Activities  SLP Interventions    TR Interventions    SW/CM Interventions Discharge Planning, Psychosocial Support, Patient/Family Education   Barriers to Discharge MD  Medical stability  Nursing Decreased caregiver support, Home environment access/layout, Neurogenic Bowel & Bladder Discharge: House  Discharge Home Layout: One level  Discharge Home Access: Ramped entrance;Limitations of Caregiver: disabled veteran but can provide supervision to min assist  PT Decreased caregiver support, Home environment access/layout, Neurogenic Bowel & Bladder, Wound Care    OT      SLP      SW Decreased caregiver support, Lack of/limited family support, Community Education Officer for SNF coverage     Team Discharge Planning: Destination: PT-Home ,OT- Home , SLP-  Projected Follow-up: PT-Home health PT, OT-  Home health OT, Outpatient OT, 24 hour supervision/assistance, SLP-  Projected Equipment Needs: PT-To be determined, OT- 3 in 1 bedside comode, Tub/shower  bench, To be determined, SLP-  Equipment Details: PT- , OT-patient and SO educated on tub transfer bench and possible equipment needs for home Patient/family involved in discharge planning: PT- Patient,  OT-Family member/caregiver, Patient, SLP-   MD ELOS: 3-4 weeks Medical Rehab Prognosis:  Excellent Assessment: The patient has been admitted for CIR therapies with the diagnosis of cervical spinal cord injury. The team will be addressing functional mobility, strength, stamina, balance, safety, adaptive techniques and equipment, self-care, bowel and bladder mgt, patient and caregiver education, NMR, community reentry. Goals have been set at supervision with mobility and self-care. Anticipated discharge destination is home.        See Team Conference Notes for weekly updates to the plan of care

## 2025-01-15 NOTE — Plan of Care (Signed)
" °  Problem: SCI BOWEL ELIMINATION Goal: RH STG MANAGE BOWEL WITH ASSISTANCE Description: STG Manage Bowel with bowel program min Assistance. Outcome: Progressing   Problem: SCI BLADDER ELIMINATION Goal: RH STG MANAGE BLADDER WITH ASSISTANCE Description: STG Manage Bladder With bladder program min Assistance Outcome: Progressing   Problem: RH SKIN INTEGRITY Goal: RH STG SKIN FREE OF INFECTION/BREAKDOWN Description: Manage skin free of infection with min assistance Outcome: Progressing   "

## 2025-01-16 ENCOUNTER — Inpatient Hospital Stay (HOSPITAL_COMMUNITY)

## 2025-01-16 DIAGNOSIS — N319 Neuromuscular dysfunction of bladder, unspecified: Secondary | ICD-10-CM | POA: Diagnosis not present

## 2025-01-16 DIAGNOSIS — N1831 Chronic kidney disease, stage 3a: Secondary | ICD-10-CM

## 2025-01-16 DIAGNOSIS — N179 Acute kidney failure, unspecified: Secondary | ICD-10-CM

## 2025-01-16 DIAGNOSIS — K592 Neurogenic bowel, not elsewhere classified: Secondary | ICD-10-CM | POA: Diagnosis not present

## 2025-01-16 DIAGNOSIS — I10 Essential (primary) hypertension: Secondary | ICD-10-CM | POA: Diagnosis not present

## 2025-01-16 DIAGNOSIS — G8250 Quadriplegia, unspecified: Secondary | ICD-10-CM | POA: Diagnosis not present

## 2025-01-16 LAB — BASIC METABOLIC PANEL WITH GFR
Anion gap: 8 (ref 5–15)
BUN: 38 mg/dL — ABNORMAL HIGH (ref 6–20)
CO2: 28 mmol/L (ref 22–32)
Calcium: 9.2 mg/dL (ref 8.9–10.3)
Chloride: 97 mmol/L — ABNORMAL LOW (ref 98–111)
Creatinine, Ser: 0.93 mg/dL (ref 0.44–1.00)
GFR, Estimated: >60 mL/min
Glucose, Bld: 136 mg/dL — ABNORMAL HIGH (ref 70–99)
Potassium: 4.7 mmol/L (ref 3.5–5.1)
Sodium: 133 mmol/L — ABNORMAL LOW (ref 135–145)

## 2025-01-16 LAB — GLUCOSE, CAPILLARY
Glucose-Capillary: 123 mg/dL — ABNORMAL HIGH (ref 70–99)
Glucose-Capillary: 187 mg/dL — ABNORMAL HIGH (ref 70–99)
Glucose-Capillary: 178 mg/dL — ABNORMAL HIGH (ref 70–99)
Glucose-Capillary: 232 mg/dL — ABNORMAL HIGH (ref 70–99)

## 2025-01-16 LAB — CBC
HCT: 34.8 % — ABNORMAL LOW (ref 36.0–46.0)
Hemoglobin: 11.7 g/dL — ABNORMAL LOW (ref 12.0–15.0)
MCH: 30.7 pg (ref 26.0–34.0)
MCHC: 33.6 g/dL (ref 30.0–36.0)
MCV: 91.3 fL (ref 80.0–100.0)
Platelets: 242 K/uL (ref 150–400)
RBC: 3.81 MIL/uL — ABNORMAL LOW (ref 3.87–5.11)
RDW: 12.3 % (ref 11.5–15.5)
WBC: 8.6 K/uL (ref 4.0–10.5)
nRBC: 0 % (ref 0.0–0.2)

## 2025-01-16 MED ORDER — PANTOPRAZOLE SODIUM 40 MG PO TBEC
40.0000 mg | DELAYED_RELEASE_TABLET | Freq: Every day | ORAL | Status: DC
  Administered 2025-01-16 – 2025-01-19 (×4): 40 mg via ORAL
  Filled 2025-01-16 (×4): qty 1

## 2025-01-16 MED ORDER — SENNA 8.6 MG PO TABS
2.0000 | ORAL_TABLET | Freq: Every day | ORAL | Status: DC
  Administered 2025-01-17 – 2025-01-20 (×4): 17.2 mg via ORAL
  Filled 2025-01-16 (×4): qty 2

## 2025-01-16 MED ORDER — CALCIUM CARBONATE ANTACID 500 MG PO CHEW: 2.0000 | CHEWABLE_TABLET | ORAL | Status: AC | PRN

## 2025-01-16 NOTE — Progress Notes (Signed)
 Patient ID: April Warren, female   DOB: 07/15/1971, 54 y.o.   MRN: [id number]   1059- SW left message for pt husband to introduce self,explain role discuss discharge process,and inform on ELOS. SW will follow-up after team conference.     Graeme Jude, MSW, LCSW Office: [phone] Cell: [phone] Fax: [phone]

## 2025-01-16 NOTE — Progress Notes (Signed)
 Occupational Therapy Session Note  Patient Details  Name: April Warren MRN: [id number] Date of Birth: 1971/04/07  Today's Date: 01/16/2025 OT Individual Time: 8984-8870 OT Individual Time Calculation (min): 74 min    Short Term Goals: Week 1:  OT Short Term Goal 1 (Week 1): Patient to perform LE dressing with Min assist OT Short Term Goal 2 (Week 1): Patient to perform tub bench transfer with Mod Assist OT Short Term Goal 3 (Week 1): Patient to increase AROM into R shoulder flexion to 150 degrees OT Short Term Goal 4 (Week 1): Patient to perform toileting with Min assist  Skilled Therapeutic Interventions/Progress Updates:    [name] with focus on BUE [facility] tasks at table level: theraputty with small beads, Squigz, removing screws from board, stacking poke chips, in hand manipulation tasks with poker chips and quarters, manipulating deck of cards, crumpling paper in one hand. Pt educated on tasks she can do in room and at home to increase BUE function. Pt returned to room and remained in w/c. All needs within reach.   Therapy Documentation Precautions:  Precautions Precautions: Fall Recall of Precautions/Restrictions: Intact Precaution/Restrictions Comments: cervical Restrictions Weight Bearing Restrictions Per Provider Order: No   Pain: Pt reports 6/10 Rt upper and lateral back pain adjacent to scapula; PA aware, images obtained, rest as appopriate ADL: ADL Eating: Minimal assistance Where Assessed-Eating: Chair Grooming: Moderate assistance Where Assessed-Grooming: Chair Upper Body Bathing: Moderate assistance Where Assessed-Upper Body Bathing: Sitting at sink Lower Body Bathing: Maximal assistance Where Assessed-Lower Body Bathing: Sitting at sink Upper Body Dressing: Moderate assistance Where Assessed-Upper Body Dressing: Wheelchair Lower Body Dressing: Maximal assistance Where Assessed-Lower Body Dressing: Wheelchair Toileting: Moderate assistance Where  Assessed-Toileting: Toilet, Psychiatrist Transfer: Moderate assistance Toilet Transfer Method: Stand pivot Toilet Transfer Equipment: Grab bars, Extra wide bedside commode Tub/Shower Transfer: Unable to assess Tub/Shower Transfer Method: Unable to assess [employer] Transfer: Unable to assess [employer] Method: Unable to assess ADL Comments: Patient with sensory deficits affecting ADL performance. Assist with balance and clothing managment in standing at RW. Initiated education on AE for dressing and bathing Vision   Perception    Praxis   Balance   Exercises:   Other Treatments:     Therapy/Group: Individual Therapy  [name] 01/16/2025, 11:29 AM

## 2025-01-16 NOTE — Progress Notes (Signed)
 "                                                        PROGRESS NOTE   Subjective/Complaints:  Felt nauseas last night with reflux. Had tomatoes which upset her stomach. Still feels a little upset this morning. Doesn't like maalox so refused.   ROS: Patient denies fever, rash, sore throat, blurred vision, dizziness, nausea, vomiting, diarrhea, cough, shortness of breath or chest pain, joint or back/neck pain, headache, or mood change.   Objective:   No results found.  Recent Labs    01/16/25 0515  WBC 8.6  HGB 11.7*  HCT 34.8*  PLT 242   Recent Labs    01/16/25 0515  NA 133*  K 4.7  CL 97*  CO2 28  GLUCOSE 136*  BUN 38*  CREATININE 0.93  CALCIUM  9.2    Intake/Output Summary (Last 24 hours) at 01/16/2025 1035 Last data filed at 01/16/2025 1024 Gross per 24 hour  Intake 920 ml  Output --  Net 920 ml        Physical Exam: Vital Signs Blood pressure (!) 125/51, pulse (!) 57, temperature 98 F (36.7 C), temperature source Oral, resp. rate 16, height 5' 9 (1.753 m), weight 115.2 kg, last menstrual period 10/28/2014, SpO2 98%.    Constitutional: No distress . Vital signs reviewed. obese HEENT: NCAT, EOMI, oral membranes moist Neck: supple Cardiovascular: RRR without murmur. No JVD    Respiratory/Chest: CTA Bilaterally without wheezes or rales. Normal effort    GI/Abdomen: BS +, non-tender, non-distended Ext: no clubbing, cyanosis, or edema Psych: pleasant and cooperative  Neurological: Ox3 Skin: posterior neck incision with surgical dressing c/d/i   Musculoskeletal:  Neuro:   Comments: RUE- biceps 4+/5; WE 4+ to 5-/5; Triceps 4+/5; Grip 4/5; FA 3/5 LUE- biceps 4/5; WE 5-/5; Triceps 4/5; Grip 3+/5; and FA 2/5 RLE- HF 4+/5; KE, DF 4/5; PF 5-/5 and EHL 5-/5 LLE- HF 4-/5; otherwise 4/5 throughout --remains stable 1/19    Comments: Ox3  Decreased to Light touch in C6 to T1 on R and C5-T1 on L Intact in torso T2- T12 Intact L1-3 B/L And decreased L4-S1 from  neuropathy? B/L Brisk Hoffman's B/L in Ue's DTR's reduced to 2+ in Ue's and LE's No clonus    Assessment/Plan: 1. Functional deficits which require 3+ hours per day of interdisciplinary therapy in a comprehensive inpatient rehab setting. Physiatrist is providing close team supervision and 24 hour management of active medical problems listed below. Physiatrist and rehab team continue to assess barriers to discharge/monitor patient progress toward functional and medical goals  Care Tool:  Bathing    Body parts bathed by patient: Right arm, Left arm, Chest, Right upper leg, Left upper leg, Face   Body parts bathed by helper: Abdomen, Front perineal area, Buttocks, Right lower leg, Left lower leg     Bathing assist Assist Level: Maximal Assistance - Patient 24 - 49%     Upper Body Dressing/Undressing Upper body dressing   What is the patient wearing?: Hospital gown only    Upper body assist Assist Level: Minimal Assistance - Patient > 75%    Lower Body Dressing/Undressing Lower body dressing      What is the patient wearing?: Pants     Lower body assist Assist for lower body dressing:  Maximal Assistance - Patient 25 - 49%     Toileting Toileting    Toileting assist Assist for toileting: Moderate Assistance - Patient 50 - 74%     Transfers Chair/bed transfer  Transfers assist     Chair/bed transfer assist level: 2 Helpers     Locomotion Ambulation   Ambulation assist   Ambulation activity did not occur: Safety/medical concerns  Assist level: 2 helpers Assistive device: Walker-rolling Max distance: 11.5 ft   Walk 10 feet activity   Assist  Walk 10 feet activity did not occur: Safety/medical concerns  Assist level: 2 helpers Assistive device: Walker-rolling   Walk 50 feet activity   Assist Walk 50 feet with 2 turns activity did not occur: Safety/medical concerns         Walk 150 feet activity   Assist Walk 150 feet activity did not occur:  Safety/medical concerns         Walk 10 feet on uneven surface  activity   Assist Walk 10 feet on uneven surfaces activity did not occur: Safety/medical concerns         Wheelchair     Assist Is the patient using a wheelchair?: Yes Type of Wheelchair: Manual    Wheelchair assist level: Dependent - Patient 0% Max wheelchair distance: 5 ft    Wheelchair 50 feet with 2 turns activity    Assist    Wheelchair 50 feet with 2 turns activity did not occur: Safety/medical concerns       Wheelchair 150 feet activity     Assist  Wheelchair 150 feet activity did not occur: Safety/medical concerns       Blood pressure (!) 125/51, pulse (!) 57, temperature 98 F (36.7 C), temperature source Oral, resp. rate 16, height 5' 9 (1.753 m), weight 115.2 kg, last menstrual period 10/28/2014, SpO2 98%.  Medical Problem List and Plan: 1. 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  Antithrombotics: -DVT/anticoagulation:  Mechanical:  Antiembolism stockings, knee (TED hose) Bilateral lower extremities-- 01/14/25 DVT study neg -antiplatelet therapy: per Dr. Joshua wait at least 5 days for DVT prophylaxis.             -begin prophylaxis this week? 3. Pain Management: Baclofen  5 mg BID. Oxycodone  and robaxin  PRN.  1/16- will increase Oxy to 10-15 mg q3 hours prn- since pain only went to 8/10 with pain meds- usually 9 or higher when meds wear off. -improved 1/17  4. Mood/Behavior/Sleep: LCSW to follow for evaluation and support when available.              -antipsychotic agents: Ativan  0.5 mg bid prn   5. Neuropsych/cognition: This patient is capable of making decisions on her own behalf.  6. Skin/Wound Care: Routine pressure  relief measures. Monitor incision sites for signs of infection. Hemo Vac removed 1/15.   7. Fluids/Electrolytes/Nutrition: Monitor I&O and weight. Follow up labs CBC/CMP                -Carb modified diet with supplements to continue.   8. Cervical spinal stenosis: s/p laminectomy by Dr. Joshua 1/12. NSGRY following, continue on decadron  taper.  9. DM2: A1c 7.1. Holding home metformin , glipizide  and januvia .   -Hyperglycemia in the setting of steroids.  SSI. Novolog , Lantus  30 units daily. Novolog  7U with meals scheduled as well.  1/19 CBGs still pretty high although better this am.   increased Lantus  to 35U daily as decadron  tapers down.   CBG (last 3)  Recent Labs    01/15/25 1624 01/15/25 2037 01/16/25 0539  GLUCAP 213* 243* 123*    10.CKD IIIa: Stable, baseline range 1.1-1.5. Monitor BMP.   1/16- Cr 0.8 and BUN 30- is dry- will push fluids- and recheck Monday  1/19 BUN up to 38---push fluids, she should be able to take in plenty on her own. 11. Acute bronchitis: CT from 1/1 without acute intrathoracic pathology. Completed 10 day course of doxycycline  1/15.               -encourage ins spiro.   12. HLD: Lipitor  80 mg   13. HTN: Holding metoprolol , lisinopril , triamterene -HCTZ. Monitor bp per protocol for need to resume.    1/19 bp controlled  Vitals:   01/12/25 1941 01/13/25 0352 01/13/25 1445 01/13/25 1953  BP: (!) 157/80 (!) 147/64 (!) 128/90 122/65   01/14/25 0448 01/14/25 1337 01/14/25 1857 01/15/25 0523  BP: 124/68 112/66 137/70 130/74   01/15/25 0851 01/15/25 1552 01/15/25 1947 01/16/25 0312  BP: 130/74 111/79 129/72 (!) 125/51    14. Neurogenic bladder: Foley removed on 1/14  monitor PVRs d/t potential for urinary retention.  1/19 PVR's low/absent. Occasional incontinence  -timed emptying for now q4 while awake  15. Neurogenic bowel: Continue Senna bid.  -Will give Sorbitol  45 cc x1 this afternoon- if no BM today, will give 60cc tomorrow and SSE. -1/16- had huge  BM on toilet- but still feels like could go- will increase Senna to 2 tabs BID since increasing bowel meds- and keep on Sorbitol  prn if needed- d/c'd Bowel program for now since went on toilet- but might need to come back if starts having incontinence.  -01/14/25 LBM 1/15, pt stated no bowel program yesterday. Will restart suppository, continue bowel program, advised pt to use sorbitol  today before dinner as well to encourage better output tonight with program. Could see if she can d/c bowel program again later.  -1/19 still having incontinent bm's  -maintain bowel program for now. Pt aware and ok  16. Nausea and reflux  -added protonix  daily  -prn tums, dc maalox       LOS: 4 days A FACE TO FACE EVALUATION WAS PERFORMED  Arthea ONEIDA Gunther 01/16/2025, 10:35 AM     "

## 2025-01-16 NOTE — Progress Notes (Signed)
 Occupational Therapy Session Note  Patient Details  Name: April Warren MRN: [id number] Date of Birth: 12/08/1971  Today's Date: 01/16/2025 OT Individual Time: 1300-1400 OT Individual Time Calculation (min): 60 min    Short Term Goals: Week 1:  OT Short Term Goal 1 (Week 1): Patient to perform LE dressing with Min assist OT Short Term Goal 2 (Week 1): Patient to perform tub bench transfer with Mod Assist OT Short Term Goal 3 (Week 1): Patient to increase AROM into R shoulder flexion to 150 degrees OT Short Term Goal 4 (Week 1): Patient to perform toileting with Min assist  Skilled Therapeutic Interventions/Progress Updates:      Therapy Documentation Precautions:  Precautions Precautions: Fall Recall of Precautions/Restrictions: Intact Precaution/Restrictions Comments: cervical Restrictions Weight Bearing Restrictions Per Provider Order: No General: Pt seated in W/C upon OT arrival, agreeable to OT.  Pain:  7/10 pain reported in neck and shoulder, positioning, intermittent rest breaks, distractions and heat modalities provided for pain management, pt reports tolerable to proceed.   ADL: OT providing skilled intervention on ADL retraining in order to increase independence with tasks and increase activity tolerance. Pt completed the following tasks at the current level of assist: Bed mobility: SBA EOB>supine, able to manage BLE onto bed  LB dressing: CGA to manage over waist with BUE, able to take foot out once sitting Transfers: CGA W/C>EOB using RW, no LOB  Exercises: OT issuing foam block hand home exercise program in order to increase strength/ROM of B hands. Pt educated on purpose of exercises and reps/sets to complete. Pt given blue foam block and instructed on the following exercises:  -power grip -key pinch -pincer grasp -three finger pinch -thumb flexion -PIP grip  Other Treatments:  OT inflating ROHO cushion d/t pt c/o of discomfort when sitting. OT told pt to f/u  with team regarding how it feels once sitting back in chair   Pt supine in bed with bed alarm activated, 2 bed rails up, call light within reach and 4Ps assessed. Husband present.    Therapy/Group: Individual Therapy  Camie Hoe, OTD, OTR/L 01/16/2025, 4:08 PM

## 2025-01-16 NOTE — Progress Notes (Signed)
 Physical Therapy Session Note  Patient Details  Name: April Warren MRN: [id number] Date of Birth: 01-24-1971  Today's Date: 01/16/2025 PT Individual Time: 0830-0930 PT Individual Time Calculation (min): 60 min   Short Term Goals: Week 1:  PT Short Term Goal 1 (Week 1): pt will stand >1 min without knee buckling PT Short Term Goal 2 (Week 1): pt will ambulate 20 ft PT Short Term Goal 3 (Week 1): Pt will perform bed mobility with min a  Skilled Therapeutic Interventions/Progress Updates:    pt received in bed and agreeable to therapy. No complaint of pain. Bed mobility with min a.   Stand pivot transfer with min a at start of session x 2. Continent bladder void on commode. Pt assisted with dressing on commode. While working on pulling up pants, pt experienced audible pop in her neck. Pt reports initial pain but quickly recovered to baseline. Discussed concern if she experiences motor or sensory changes, but pt reports feeling normal. OT notified NP of concerns during next session. Pt then ambulated 2 x ~50 ft and x ~100 ft with RW and CGA, note some knee instability/hyperextension, but greatly improved from eval. Pt remained seated in w/c, was left with all needs in reach and alarm active.    Therapy Documentation Precautions:  Precautions Precautions: Fall Recall of Precautions/Restrictions: Intact Precaution/Restrictions Comments: cervical Restrictions Weight Bearing Restrictions Per Provider Order: No General:       Therapy/Group: Individual Therapy  April Warren 01/16/2025, 1:36 PM

## 2025-01-16 NOTE — Care Management (Signed)
 [facility] Individual Statement of Services  Patient Name:  April Warren  Date:  01/16/2025  Welcome to the [facility].  Our goal is to provide you with an individualized program based on your diagnosis and situation, designed to meet your specific needs.  With this comprehensive rehabilitation program, you will be expected to participate in at least 3 hours of rehabilitation therapies Monday-Friday, with modified therapy programming on the weekends.  Your rehabilitation program will include the following services:  Physical Therapy (PT), Occupational Therapy (OT), 24 hour per day rehabilitation nursing, Therapeutic Recreaction (TR), Psychology, Neuropsychology, Care Coordinator, Rehabilitation Medicine, Nutrition Services, Pharmacy Services, and Other  Weekly team conferences will be held on Tuesday to discuss your progress.  Your Inpatient Rehabilitation Care Coordinator will talk with you frequently to get your input and to update you on team discussions.  Team conferences with you and your family in attendance may also be held.  Expected length of stay: 3-4 weeks  Overall anticipated outcome: Supervision  Depending on your progress and recovery, your program may change. Your Inpatient Rehabilitation Care Coordinator will coordinate services and will keep you informed of any changes. Your Inpatient Rehabilitation Care Coordinator's name and contact numbers are listed  below.  The following services may also be recommended but are not provided by the [facility]:  Driving Evaluations Home Health Rehabiltiation Services Outpatient Rehabilitation Services Vocational Rehabilitation   Arrangements will be made to provide these services after discharge if needed.  Arrangements include referral to agencies that provide these services.  Your insurance has been verified to be:  [employer]  Your primary doctor is:  Harlene Horton  Pertinent information will be shared with your doctor and your insurance company.  Inpatient Rehabilitation Care Coordinator:  Graeme Jude, KEN [phone] or ([phone]  Information discussed with and copy given to patient by: Graeme DELENA Jude, 01/16/2025, 10:57 AM

## 2025-01-17 DIAGNOSIS — G8250 Quadriplegia, unspecified: Secondary | ICD-10-CM | POA: Diagnosis not present

## 2025-01-17 DIAGNOSIS — N319 Neuromuscular dysfunction of bladder, unspecified: Secondary | ICD-10-CM | POA: Diagnosis not present

## 2025-01-17 DIAGNOSIS — I10 Essential (primary) hypertension: Secondary | ICD-10-CM | POA: Diagnosis not present

## 2025-01-17 DIAGNOSIS — N1831 Chronic kidney disease, stage 3a: Secondary | ICD-10-CM | POA: Diagnosis not present

## 2025-01-17 DIAGNOSIS — K592 Neurogenic bowel, not elsewhere classified: Secondary | ICD-10-CM | POA: Diagnosis not present

## 2025-01-17 DIAGNOSIS — N179 Acute kidney failure, unspecified: Secondary | ICD-10-CM | POA: Diagnosis not present

## 2025-01-17 LAB — GLUCOSE, CAPILLARY
Glucose-Capillary: 194 mg/dL — ABNORMAL HIGH (ref 70–99)
Glucose-Capillary: 196 mg/dL — ABNORMAL HIGH (ref 70–99)
Glucose-Capillary: 233 mg/dL — ABNORMAL HIGH (ref 70–99)
Glucose-Capillary: 219 mg/dL — ABNORMAL HIGH (ref 70–99)

## 2025-01-17 MED ORDER — LIDOCAINE 5 % EX PTCH
1.0000 | MEDICATED_PATCH | CUTANEOUS | Status: AC
  Administered 2025-01-17 – 2025-02-03 (×16): 1 via TRANSDERMAL
  Filled 2025-01-17 (×18): qty 1

## 2025-01-17 NOTE — Progress Notes (Signed)
 Occupational Therapy Session Note  Patient Details  Name: April Warren MRN: [id number] Date of Birth: [birth year]  Today's Date: 01/17/2025 OT Individual Time: 9154-9044 OT Individual Time Calculation (min): 70 min    Short Term Goals: Week 1:  OT Short Term Goal 1 (Week 1): Patient to perform LE dressing with Min assist OT Short Term Goal 2 (Week 1): Patient to perform tub bench transfer with Mod Assist OT Short Term Goal 3 (Week 1): Patient to increase AROM into R shoulder flexion to 150 degrees OT Short Term Goal 4 (Week 1): Patient to perform toileting with Min assist  Skilled Therapeutic Interventions/Progress Updates:    Pt resting in bed upon arrival and agreeable to therapy. Pt requested use of toilet. Stand pivot transfer w/c<>BSC over toilet with CGA. Hygiene with supervision. Pt completed LB dressing using reacher with CGA. Hand strengthening tasks with graded clothes pins with focus on pincher grip. [facility] tasks with small poker chips. Pt required rest breaks 2/2 Rt shoulder pain. MD assessed pt during therapy. Pt returned to room and remained in w/c with all needs within reach.   Therapy Documentation Precautions:  Precautions Precautions: Fall Recall of Precautions/Restrictions: Intact Precaution/Restrictions Comments: cervical Restrictions Weight Bearing Restrictions Per Provider Order: No   Pain: Pt c/o 8/10 Rt shoulder pain; meds admin during therapy and MD aware; rest as appropriate  Therapy/Group: Individual Therapy  [name] 01/17/2025, 10:06 AM

## 2025-01-17 NOTE — Progress Notes (Signed)
 Occupational Therapy Session Note  Patient Details  Name: April Warren MRN: [id number] Date of Birth: 1971/02/17  Today's Date: 01/17/2025 OT Individual Time: 1300-1355 OT Individual Time Calculation (min): 55 min    Short Term Goals: Week 1:  OT Short Term Goal 1 (Week 1): Patient to perform LE dressing with Min assist OT Short Term Goal 2 (Week 1): Patient to perform tub bench transfer with Mod Assist OT Short Term Goal 3 (Week 1): Patient to increase AROM into R shoulder flexion to 150 degrees OT Short Term Goal 4 (Week 1): Patient to perform toileting with Min assist  Skilled Therapeutic Interventions/Progress Updates:      Therapy Documentation Precautions:  Precautions Precautions: Fall Recall of Precautions/Restrictions: Intact Precaution/Restrictions Comments: cervical Restrictions Weight Bearing Restrictions Per Provider Order: No General: Pt seated in W/C upon OT arrival, agreeable to OT. Pt presenting with heat on shoulder from kpad d/t pain.   Pain:  7/10 pain reported in Lt shoulder and neck, activity, intermittent rest breaks, distractions provided for pain management, pt reports tolerable to proceed.   Exercises: Pt completed the following exercise circuit in order to improve functional activity, strength, ROM, and endurance to prepare for ADLs such as bathing. Pt completed the following exercises in seated position with no noted LOB/SOB: -pendulums in all planes, able to stand 2 min during pendulums -towel slides forward/backward -towel slides Lt and Rt  - towel slides clockwise/counter clockwise   Pt seated in W/C at end of session with W/C alarm donned, call light within reach and 4Ps assessed. Hot pack applied to    Therapy/Group: Individual Therapy  Camie Hoe, OTD, OTR/L 01/17/2025, 2:01 PM

## 2025-01-17 NOTE — Progress Notes (Signed)
 "                                                        PROGRESS NOTE   Subjective/Complaints: Experienced cracking/popping in neck and right shoulder yesterday while in therapy. I reviewed her xrays which revealed no acute findings. There is soft tissue calcification in shoulder--(see discussion below)  ROS: Patient denies fever, rash, sore throat, blurred vision, dizziness, nausea, vomiting, diarrhea, cough, shortness of breath or chest pain, headache, or mood change.   Objective:   DG Cervical Spine 2 or 3 views Result Date: 01/16/2025 CLINICAL DATA:  Pop in neck neck and right shoulder pain EXAM: CERVICAL SPINE - 2-3 VIEW COMPARISON:  01/09/2025, MRI 01/04/2025, CT 01/02/2025 FINDINGS: Posterior spinal fusion hardware extending from C2 through C7 with grossly stable appearance compared with limited intraoperative fluoroscopic views from 01/09/2025. Dens and lateral masses are within normal limits. Multilevel degenerative osteophytes. IMPRESSION: Posterior spinal fusion hardware extending from C2 through C7. No acute osseous abnormality. Electronically Signed   By: Luke Bun M.D.   On: 01/16/2025 19:33   DG Shoulder Right Result Date: 01/16/2025 EXAM: 1 VIEW(S) XRAY OF THE RIGHT SHOULDER 01/16/2025 11:40:00 AM COMPARISON: None available. CLINICAL HISTORY: Reported crepitus of C-spine and shoulder. FINDINGS: BONES AND JOINTS: Glenohumeral joint is normally aligned. No acute fracture. No malalignment. The [facility] joint is unremarkable. SOFT TISSUES: There is soft tissue calcification within the acromiohumeral interval suggesting chronic rotator cuff disease. Visualized lung is unremarkable. IMPRESSION: 1. Soft tissue calcification within the acromiohumeral interval, suggesting chronic rotator cuff disease. Electronically signed by: Evalene Coho MD 01/16/2025 12:06 PM EST RP Workstation: [id number]    Recent Labs    01/16/25 0515  WBC 8.6  HGB 11.7*  HCT 34.8*  PLT 242   Recent Labs     01/16/25 0515  NA 133*  K 4.7  CL 97*  CO2 28  GLUCOSE 136*  BUN 38*  CREATININE 0.93  CALCIUM  9.2    Intake/Output Summary (Last 24 hours) at 01/17/2025 1023 Last data filed at 01/17/2025 0728 Gross per 24 hour  Intake 1320 ml  Output --  Net 1320 ml        Physical Exam: Vital Signs Blood pressure 132/77, pulse 67, temperature 98 F (36.7 C), temperature source Oral, resp. rate 16, height 5' 9 (1.753 m), weight 115.2 kg, last menstrual period 10/28/2014, SpO2 97%.    Constitutional: No distress . Vital signs reviewed. HEENT: NCAT, EOMI, oral membranes moist Neck: supple Cardiovascular: RRR without murmur. No JVD    Respiratory/Chest: CTA Bilaterally without wheezes or rales. Normal effort    GI/Abdomen: BS +, non-tender, non-distended Ext: no clubbing, cyanosis, or edema Psych: pleasant and cooperative  Neurological: Ox3 Skin: posterior neck incision with surgical dressing c/d/i   Musculoskeletal:  There is pain along spine of right scapula. She also is limited in PROM of the shoulder in all planes. Was able to abduct shoulder to about 75 degrees then met resistance, and shoulder acutally 'popped'.  Neuro:   Comments: RUE- biceps 4+/5; WE 4+ to 5-/5; Triceps 4+/5; Grip 4/5; FA 3/5 LUE- biceps 4/5; WE 5-/5; Triceps 4/5; Grip 3+/5; and FA 2/5 RLE- HF 4+/5; KE, DF 4/5; PF 5-/5 and EHL 5-/5 LLE- HF 4-/5; otherwise 4/5 throughout --remains stable 1/19    Comments: Ox3  Decreased to Light touch in C6 to T1 on R and C5-T1 on L Intact in torso T2- T12 Intact L1-3 B/L And decreased L4-S1 from neuropathy? B/L Brisk Hoffman's B/L in Ue's DTR's reduced to 2+ in Ue's and LE's No clonus    Assessment/Plan: 1. Functional deficits which require 3+ hours per day of interdisciplinary therapy in a comprehensive inpatient rehab setting. Physiatrist is providing close team supervision and 24 hour management of active medical problems listed below. Physiatrist and rehab team  continue to assess barriers to discharge/monitor patient progress toward functional and medical goals  Care Tool:  Bathing    Body parts bathed by patient: Right arm, Left arm, Chest, Right upper leg, Left upper leg, Face   Body parts bathed by helper: Abdomen, Front perineal area, Buttocks, Right lower leg, Left lower leg     Bathing assist Assist Level: Maximal Assistance - Patient 24 - 49%     Upper Body Dressing/Undressing Upper body dressing   What is the patient wearing?: Hospital gown only    Upper body assist Assist Level: Minimal Assistance - Patient > 75%    Lower Body Dressing/Undressing Lower body dressing      What is the patient wearing?: Pants     Lower body assist Assist for lower body dressing: Minimal Assistance - Patient > 75%     Toileting Toileting    Toileting assist Assist for toileting: Minimal Assistance - Patient > 75%     Transfers Chair/bed transfer  Transfers assist     Chair/bed transfer assist level: 2 Helpers     Locomotion Ambulation   Ambulation assist   Ambulation activity did not occur: Safety/medical concerns  Assist level: 2 helpers Assistive device: Walker-rolling Max distance: 11.5 ft   Walk 10 feet activity   Assist  Walk 10 feet activity did not occur: Safety/medical concerns  Assist level: 2 helpers Assistive device: Walker-rolling   Walk 50 feet activity   Assist Walk 50 feet with 2 turns activity did not occur: Safety/medical concerns         Walk 150 feet activity   Assist Walk 150 feet activity did not occur: Safety/medical concerns         Walk 10 feet on uneven surface  activity   Assist Walk 10 feet on uneven surfaces activity did not occur: Safety/medical concerns         Wheelchair     Assist Is the patient using a wheelchair?: Yes Type of Wheelchair: Manual    Wheelchair assist level: Dependent - Patient 0% Max wheelchair distance: 5 ft    Wheelchair 50 feet  with 2 turns activity    Assist        Assist Level: Dependent - Patient 0%   Wheelchair 150 feet activity     Assist      Assist Level: Dependent - Patient 0%   Blood pressure 132/77, pulse 67, temperature 98 F (36.7 C), temperature source Oral, resp. rate 16, height 5' 9 (1.753 m), weight 115.2 kg, last menstrual period 10/28/2014, SpO2 97%.  Medical Problem List and Plan: 1. 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 Interdisciplinary team  conference today to discuss goals, barriers to discharge, and dc planning.    2.  Antithrombotics: -DVT/anticoagulation:  Mechanical:  Antiembolism stockings, knee (TED hose) Bilateral lower extremities-- 01/14/25 DVT study neg -antiplatelet therapy: per Dr. Joshua wait at least 5 days for DVT prophylaxis.             -begin prophylaxis later this week 3. Pain Management: Baclofen  5 mg BID. Oxycodone  and robaxin  PRN.  1/16- will increase Oxy to 10-15 mg q3 hours prn- since pain only went to 8/10 with pain meds- usually 9 or higher when meds wear off. -improved 1/17  1/20 pt appears to have adhesive capsulitis of the right shoulder with referred pain. I suspect when she was working with therapy yesterday that she met resistance and pushed past it where she felt grinding or popping as well as pain. We discussed the importance of improving her ROM and at least maintaining it.    -will add lidocaine  patch for scapular pain 4. Mood/Behavior/Sleep: LCSW to follow for evaluation and support when available.              -antipsychotic agents: Ativan  0.5 mg bid prn   5. Neuropsych/cognition: This patient is capable of making decisions on her own behalf.  6. Skin/Wound Care: Routine pressure relief  measures. Monitor incision sites for signs of infection. Hemo Vac removed 1/15.   7. Fluids/Electrolytes/Nutrition: Monitor I&O and weight. Follow up labs CBC/CMP                -Carb modified diet with supplements to continue.   8. Cervical spinal stenosis: s/p laminectomy by Dr. Joshua 1/12. NSGRY following, continue on decadron  taper.   9. DM2: A1c 7.1. Holding home metformin , glipizide  and januvia .   -Hyperglycemia in the setting of steroids.  SSI. Novolog , Lantus  30 units daily. Novolog  7U with meals scheduled as well.  1/19 CBGs still pretty high although better this am.   increased Lantus  to 35U daily as decadron  tapers down.  1/20-decadron  lower since yesterday--continue with same lantus  CBG (last 3)  Recent Labs    01/16/25 1635 01/16/25 2058 01/17/25 0551  GLUCAP 178* 232* 194*    10.CKD IIIa: Stable, baseline range 1.1-1.5.  .   1/16- Cr 0.8 and BUN 30- is dry- will push fluids- and recheck Monday  1/19 BUN up to 38---push fluids, she should be able to take in plenty on her own.  1/20 recheck labs Thursday 11. Acute bronchitis: CT from 1/1 without acute intrathoracic pathology. Completed 10 day course of doxycycline  1/15.               -encourage ins spiro.   12. HLD: Lipitor  80 mg   13. HTN: Holding metoprolol , lisinopril , triamterene -HCTZ. Monitor bp per protocol for need to resume.    1/19 bp controlled  Vitals:   01/13/25 1953 01/14/25 0448 01/14/25 1337 01/14/25 1857  BP: 122/65 124/68 112/66 137/70   01/15/25 0523 01/15/25 0851 01/15/25 1552 01/15/25 1947  BP: 130/74 130/74 111/79 129/72   01/16/25 0312 01/16/25 1556 01/16/25 1934 01/17/25 0254  BP: (!) 125/51 131/71 (!) 115/50 132/77    14. Neurogenic bladder: Foley removed on 1/14  monitor PVRs d/t potential for urinary retention.  1/19 PVR's low/absent. Occasional incontinence  -timed emptying for now q4 while awake  15. Neurogenic bowel: Continue Senna bid.  -Will give Sorbitol  45 cc x1 this afternoon-  if no BM today, will give 60cc tomorrow and SSE. -1/16- had huge BM on toilet-  but still feels like could go- will increase Senna to 2 tabs BID since increasing bowel meds- and keep on Sorbitol  prn if needed- d/c'd Bowel program for now since went on toilet- but might need to come back if starts having incontinence.  -01/14/25 LBM 1/15, pt stated no bowel program yesterday. Will restart suppository, continue bowel program, advised pt to use sorbitol  today before dinner as well to encourage better output tonight with program. Could see if she can d/c bowel program again later.  -1/19-20 still having incontinent bm's, last one formed  -maintain PM bowel program for now.   16. Nausea and reflux  -added protonix  daily  -prn tums, dc maalox       LOS: 5 days A FACE TO FACE EVALUATION WAS PERFORMED  Arthea ONEIDA Gunther 01/17/2025, 10:23 AM     "

## 2025-01-17 NOTE — Progress Notes (Signed)
 IP Rehab Bowel Program Documentation   Bowel Program Start time [phone]  Dig Stim Indicated? Yes  Dig Stim Prior to Suppository or mini Enema X 3   Output from dig stim: None, BM before Bowel program  Ordered intervention: Suppository Yes , mini enema No ,   Repeat dig stim after Suppository or Mini enema  X 3,  Output?    Bowel Program Complete? Yes , handoff given YES  Patient Tolerated? Yes

## 2025-01-17 NOTE — Progress Notes (Signed)
 Physical Therapy Session Note  Patient Details  Name: April Warren MRN: [id number] Date of Birth: 05-22-1971  Today's Date: 01/17/2025 PT Individual Time: 1400-1500 PT Individual Time Calculation (min): 60 min   Short Term Goals: Week 1:  PT Short Term Goal 1 (Week 1): pt will stand >1 min without knee buckling PT Short Term Goal 2 (Week 1): pt will ambulate 20 ft PT Short Term Goal 3 (Week 1): Pt will perform bed mobility with min a  Skilled Therapeutic Interventions/Progress Updates:    Pt seated in w/c on arrival and agreeable to therapy. Pt reports 8.5/10 pain with mobility, premedicated. Rest and positioning provided as needed.   Pt transported to therapy gym for time management and energy conservation. ambulatory transfer to nustep with CGA overall, minimal knee instability noted. Pt used double hills profile, 2 x 10 min with 5 min rest break, load interval 6-12. Reports increase in pain followed by improved pain by end of second bout, discussed therapeutic exercise dosing for pain management and mobility, release of endorphins with activity.   Pt performed 4 x 6 squats with CGA and RW. Provided education on strength and conditioning principles during rest breaks. Pt with improved TKE, but continued hyperextension with fatigue.   Pt returned to room and remained in w/c, was left with all needs in reach and alarm active.   Therapy Documentation Precautions:  Precautions Precautions: Fall Recall of Precautions/Restrictions: Intact Precaution/Restrictions Comments: cervical Restrictions Weight Bearing Restrictions Per Provider Order: No General:       Therapy/Group: Individual Therapy  April Warren 01/17/2025, 2:11 PM

## 2025-01-17 NOTE — Progress Notes (Signed)
 Patient ID: April Warren, female   DOB: [birth year], 54 y.o.   MRN: [id number]  SW met with pt in room to provide updates from team conference, and d/c date 2/13. She is aware SW will call her husband.   1517- SW returned phone call to pt husband to provide above updates. Fam edu on 2/9 9am-11amSABRA Graeme Jude, MSW, LCSW Office: [phone] Cell: [phone] Fax: [phone]

## 2025-01-17 NOTE — Patient Care Conference (Signed)
 Inpatient RehabilitationTeam Conference and Plan of Care Update Date: 01/18/2025   Time: 1128 am    Patient Name: April Warren      Medical Record Number: [id number]  Date of Birth: [birth year] Sex: Female         Room/Bed: 4W12C/4W12C-02 Payor Info: Payor: TRICARE / Plan: TRICARE [place]AST / Product Type: *No Product type* /    Admit Date/Time:  01/12/2025  1:37 PM  Primary Diagnosis:  Acute incomplete quadriplegia [facility])  Hospital Problems: Principal Problem:   Acute incomplete quadriplegia (HCC) Active Problems:   Spinal cord injury, cervical region [facility])    Expected Discharge Date: Expected Discharge Date: 02/10/25  Team Members Present: Physician leading conference: Dr. Arthea Gunther Social Worker Present: Graeme Jude, LCSW Nurse Present: Eulalio Falls, RN PT Present: Schuyler Batter, PT OT Present: Camie Hoe, OT     Current Status/Progress Goal Weekly Team Focus  Bowel/Bladder   Continent of B&B and able to get to toilet with assistance from staff   To  toilet self with min. assist from staff   cont. assisting with toileting    Swallow/Nutrition/ Hydration               ADL's   CGA LB ADLs, Min A toileting, SBA UB ADLs, CGA transfers, working on [facility] with BUEs   CGA-SBA overall, may upgrade if continues to show good progress   increase endurance, BUE/BLE strengthing, FMC    Mobility   min a bed mob, transfers, gait up to 68 ft with RW   supervision overall, gait to 150 ft  transfers, gait    Communication                Safety/Cognition/ Behavioral Observations               Pain   Occasional complaint of pain at affected site   To have mild or no pain   On reducing the need for pain medication    Skin   Intact except for surgical site  Hydrocolloid dressing ; per MD to keep it on To prevent any further skin issue  Cintinue assessing skin and addressing issues as needed      Discharge Planning:  Pt will d/c to home with her  husband. 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 SW will confirm there are no barriers to discharge.    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 Patient with neck and right pain, elevated blood sugars, nausea: medication and treatments adjusted by MD. Patient with neurogenic bowels. Patient limited by poor endurance.   Patient on target to meet rehab goals: yes, currently patient needs supervision assistance with upper body care and needs CGA with lower body ADLs. Patient needs min assistance with transfers. Patient was able to ambulate up to 50-100' using a rolling walker with min assistance. Overall goals at discharge set for supervision assistance.  *See Care Plan and progress notes for long and short-term goals.  Revisions to Treatment Plan:  Xray Lidocaine  patches  Upgraded goals with physical therapy  Teaching Needs: Safety, medications, transfers, toileting, etc   Current Barriers to Discharge: Decreased caregiver support and Home enviroment access/layout  Possible Resolutions to Barriers: Family Education     Medical Summary Current Status: c4 asia d. neck and right shoulder pain. adhesive capsulitis right shoulder. neurogenic  bowel  Barriers to Discharge: Uncontrolled Pain;Morbid Obesity;Medical stability   Possible Resolutions to [employer] Focus: daily assessment of labs, pain control, bowel program. AROM/PROM right shoulder.   Continued Need for Acute Rehabilitation Level of Care: The patient requires daily medical management by a physician with specialized training in physical medicine and rehabilitation for the following reasons: Direction of a multidisciplinary physical rehabilitation program to maximize functional independence : Yes Medical management of patient stability for increased activity during participation in  an intensive rehabilitation regime.: Yes Analysis of laboratory values and/or radiology reports with any subsequent need for medication adjustment and/or medical intervention. : Yes   I attest that I was present, lead the team conference, and concur with the assessment and plan of the team.   Rajat Staver Gayo 01/18/2025, 1128 am

## 2025-01-17 NOTE — Plan of Care (Signed)
" °  Problem: SCI BOWEL ELIMINATION Goal: RH STG MANAGE BOWEL WITH ASSISTANCE Description: STG Manage Bowel with bowel program min Assistance. Outcome: Progressing   Problem: SCI BLADDER ELIMINATION Goal: RH STG MANAGE BLADDER WITH ASSISTANCE Description: STG Manage Bladder With bladder program min Assistance Outcome: Progressing   Problem: RH SKIN INTEGRITY Goal: RH STG SKIN FREE OF INFECTION/BREAKDOWN Description: Manage skin free of infection with min assistance Outcome: Progressing   Problem: RH SAFETY Goal: RH STG ADHERE TO SAFETY PRECAUTIONS W/ASSISTANCE/DEVICE Description: STG Adhere to Safety Precautions With min Assistance/Device. Outcome: Progressing   Problem: RH PAIN MANAGEMENT Goal: RH STG PAIN MANAGED AT OR BELOW PT'S PAIN GOAL Description: <4 w/ prns Outcome: Progressing   Problem: RH KNOWLEDGE DEFICIT SCI Goal: RH STG INCREASE KNOWLEDGE OF SELF CARE AFTER SCI Description: Manage increase knowledge of self care with min assistance from spouse using educational materials provided Outcome: Progressing   "

## 2025-01-18 DIAGNOSIS — I10 Essential (primary) hypertension: Secondary | ICD-10-CM | POA: Diagnosis not present

## 2025-01-18 DIAGNOSIS — K592 Neurogenic bowel, not elsewhere classified: Secondary | ICD-10-CM | POA: Diagnosis not present

## 2025-01-18 DIAGNOSIS — N319 Neuromuscular dysfunction of bladder, unspecified: Secondary | ICD-10-CM | POA: Diagnosis not present

## 2025-01-18 DIAGNOSIS — G8250 Quadriplegia, unspecified: Secondary | ICD-10-CM | POA: Diagnosis not present

## 2025-01-18 DIAGNOSIS — F4321 Adjustment disorder with depressed mood: Secondary | ICD-10-CM | POA: Diagnosis present

## 2025-01-18 LAB — GLUCOSE, CAPILLARY
Glucose-Capillary: 146 mg/dL — ABNORMAL HIGH (ref 70–99)
Glucose-Capillary: 240 mg/dL — ABNORMAL HIGH (ref 70–99)
Glucose-Capillary: 219 mg/dL — ABNORMAL HIGH (ref 70–99)
Glucose-Capillary: 297 mg/dL — ABNORMAL HIGH (ref 70–99)

## 2025-01-18 MED ORDER — PROCHLORPERAZINE MALEATE 5 MG PO TABS
5.0000 mg | ORAL_TABLET | Freq: Four times a day (QID) | ORAL | Status: AC | PRN
  Administered 2025-01-30: 10 mg via ORAL
  Filled 2025-01-18 (×3): qty 2

## 2025-01-18 NOTE — Plan of Care (Signed)
" °  Problem: Consults Goal: RH SPINAL CORD INJURY PATIENT EDUCATION Description:  See Patient Education module for education specifics.  Outcome: Progressing   Problem: SCI BOWEL ELIMINATION Goal: RH STG MANAGE BOWEL WITH ASSISTANCE Description: STG Manage Bowel with bowel program min Assistance. Outcome: Progressing   Problem: SCI BLADDER ELIMINATION Goal: RH STG MANAGE BLADDER WITH ASSISTANCE Description: STG Manage Bladder With bladder program min Assistance Outcome: Progressing   Problem: RH SKIN INTEGRITY Goal: RH STG SKIN FREE OF INFECTION/BREAKDOWN Description: Manage skin free of infection with min assistance Outcome: Progressing   Problem: RH SAFETY Goal: RH STG ADHERE TO SAFETY PRECAUTIONS W/ASSISTANCE/DEVICE Description: STG Adhere to Safety Precautions With min Assistance/Device. Outcome: Progressing   Problem: RH PAIN MANAGEMENT Goal: RH STG PAIN MANAGED AT OR BELOW PT'S PAIN GOAL Description: <4 w/ prns Outcome: Progressing   Problem: RH KNOWLEDGE DEFICIT SCI Goal: RH STG INCREASE KNOWLEDGE OF SELF CARE AFTER SCI Description: Manage increase knowledge of self care with min assistance from spouse using educational materials provided Outcome: Progressing   "

## 2025-01-18 NOTE — Progress Notes (Incomplete)
 IP Rehab Bowel Program Documentation   Bowel Program Start time 1840   Dig Stim Indicated? Yes  Dig Stim Prior to Suppository or mini Enema X 1   Output from dig stim: none   Ordered intervention: Suppository Yes , mini enema No ,   Repeat dig stim after Suppository or Mini enema  X   Output?    Bowel Program Complete? No , handoff given yes   Patient Tolerated? Yes

## 2025-01-18 NOTE — Progress Notes (Signed)
 Physical Therapy Session Note  Patient Details  Name: April Warren MRN: [id number] Date of Birth: [birth year]  Today's Date: 01/18/2025 PT Individual Time: 1045-1200 and 1345-1445 PT Individual Time Calculation (min): 75 min and 60 min  Short Term Goals: Week 1:  PT Short Term Goal 1 (Week 1): pt will stand >1 min without knee buckling PT Short Term Goal 2 (Week 1): pt will ambulate 20 ft PT Short Term Goal 3 (Week 1): Pt will perform bed mobility with min a  Skilled Therapeutic Interventions/Progress Updates: Pt presented in w/c agreeable to therapy. Pt c/o pain in R shoulder (dx adhesive capsulitis). Pt transported to day room and participated in AROM of LE as warm up while listening to live music. Pt performed heel/toe raises, LAQ, hip flexion minimum of x 10 reps bilaterally/to fatigue. Pt then participated in gait with RW and CGA ~133ft without rest break. Pt then completed ambulatory transfer to high/low mat. Participated in Sit to stand x 5 with LE on 4in block for increased LE ms recruitment. Pt able to complete with CGA with LLE on step but required minA when RLE on step. Pt also performed hamstring pulls with green theraband 2 x 10. Completed stand step transfer back to w/c in same manner as prior. Pt transported back to room and when pt stood to complete transfer to recliner pt c/o increased nausea and lightheadedness. Pt returned to w/c with PTA providing emesis bag while obtaining dynamap. Pt with + episode however stated felt significantly better after episode. Pt then requesting to use bathroom. Completed ambulatory transfer to bathroom and completed clothing management and transfers with CGA. Completed peri-care seated with supervision and stood with CGA and required minA for clothing management. Pt then ambulated back to sink and completed hand hygiene in standing with CGA with mild knee instability noted. Pt then returned to recliner and left in recliner with Kpad on R shoulder, NT  present doing BS check.   Tx2: Pt presented in recliner agreeable to therapy. Pt c/o mild R shoulder pain, no intervention requested. Olam, RT present throughout session. PTA performed peripheral joint mobilizations grade ll-lll to R shoulder posteriorly and laterally for pain management and increased range. Pt denies pain during session c/o mild pain when completed PROM and reaching end range in flexion and abduction. Pt then completed stand step transfer to w/c. On first stand pt able to stand with CGA however c/o lightheadedness and immediately sat down. BP checked 135/80 (97) HR 72. Pt stated quickly resolved and denies any further lightheadedness. Pt stood again with CGA and completed stand step with CGA and no sx. Pt transported to [facility] entrance/atrium for environmental change. Pt was navigated through gift shop with pt performing intermittent stands to observe items. Pt then moved over to booth area in atrium and completed stand step to booth with CGA. Discussed integration into community and monitoring energy levels when outside of home. Pt then ambulated ~43ft with CGA and w/c follow. Noted increased R knee instability with fatigue but no overt LOB. Pt then transported to main gym and during seated rest break used finger ladder on RUE x 5 with extended stretch on last round. Lastly pt worked on LE strengthening with pt performing 2 x 5 step ups with RLE to 3in step. Minimal instability noted and no c/o pain from pt. Pt transported back to room at end of session and completed stand step transfer to bed (per nsg request for PVR). Pt completed sit to supine with supervision  and bed flat!! Pt left in bed at end of session with NT present and current needs met.      Therapy Documentation Precautions:  Precautions Precautions: Fall Recall of Precautions/Restrictions: Intact Precaution/Restrictions Comments: cervical Restrictions Weight Bearing Restrictions Per Provider Order: No    Therapy/Group:  Individual Therapy  Lubna Stegeman 01/18/2025, 4:31 PM

## 2025-01-18 NOTE — Consult Note (Signed)
 Neuropsychological Consultation Comprehensive Inpatient Rehab   Patient:   April Warren   DOB:   01-22-1971  MR Number:  [id number]  Location:  [facility] [address] CENTER [address] [place] [address] Dept: [phone] Loc: [phone]           Date of Service:   01/18/2025  Start Time:   3 PM End Time:   4 PM  Provider/Observer:  Norleen Asa, Psy.D.       Clinical Neuropsychologist       Billing Code/Service: [phone]  Reason for Service:    April Warren is a 54 year old female referred for neuropsychological consultation due to coping and adjustment issues with adjustment and coping issues developed during extended hospital stay and individual who had focus much of her efforts through the years and taking care of others and struggled with primarily taking care of herself.  Patient is currently admitted to the inpatient rehab unit with extended hospital stay.  Patient is postsurgical intervention following C2-C7 posterior laminectomy with lateral mass fusion for severe cervical stenosis and cord compression.  Presenting Concerns: Coping with extended hospital stay and still roughly 23 days from expected discharge after neurosurgical interventions for severe spinal stenosis.  Patient has struggled without being able to see her kids although a unit pass has been granted and the patient is expected to be able to see her children later today after therapy interventions are completed.  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 Ossification of posterior longitudinal ligament. History of falls. > - Psychiatric: Reports stress related to hospitalization and family events (uncle's death). - Developmental/Academic: Not assessed. - Medical: T2DM, diabetic retinopathy, HTN, HLD, CKD 3a, obesity. Post-operative urinary retention  (resolved). Acute bronchitis. AKI. Hgb A1c 5.1 (09/22/2024). Negative for COVID, flu, RSV. Medications: Aspirin, DVT prophylaxis (TED hose, Lovenox ). Home HTN meds (metoprolol , lisinopril , triamterene -hydrochlorothiazide ) held.  Functional Capacity: Previously independent and driving without DME. Lives in a one-level home with ramped entrance with spouse and 5 underage children. Currently requires minimum assist +2 for ambulation of 6 ft. Transfers with min to moderate assist. ADLs at bed level with moderate to maximum assist. Admitted for comprehensive rehab program.  Mental State Examination: - Appearance and Behaviour: Cooperative and engaged in conversation. - Speech: Normal rate and volume. - Mood and Affect: Reports coping relatively well given circumstances. Expressed distress regarding family events and being separated from children. - Thought Process/Content: Logical and coherent. - Cognition: Alert and oriented. Attention appears intact. Memory recall appears functional for recent events. - Insight and Judgement: Good insight into current medical situation and rehabilitation goals. Demonstrates good judgement regarding safety and asking for help.  Clinical Impressions: Patient reports that she is coping well with the significant stress of a prolonged hospitalization and recent major surgery. Demonstrates good insight and motivation for rehabilitation. No acute neuropsychological deficits noted. Primary concerns are related to emotional distress from family separation and recent loss. Functional deficits are significant but improving with PT/OT.  Recommendations and Next Steps: Continue with comprehensive rehabilitation program. Monitor for emotional distress and provide support. Encourage communication with care team regarding any struggles. Facilitate family contact, including ground pass for children's visit. Psychoeducation provided on the importance of self-care and communicating needs  to providers.      Electronically Signed   _______________________ Norleen Asa, Psy.D. Clinical Neuropsychologist

## 2025-01-18 NOTE — Progress Notes (Signed)
 Occupational Therapy Session Note  Patient Details  Name: April Warren MRN: [id number] Date of Birth: April 18, 1971  Today's Date: 01/18/2025 OT Individual Time: 9084-8969 OT Individual Time Calculation (min): 75 min    Short Term Goals: Week 1:  OT Short Term Goal 1 (Week 1): Patient to perform LE dressing with Min assist OT Short Term Goal 2 (Week 1): Patient to perform tub bench transfer with Mod Assist OT Short Term Goal 3 (Week 1): Patient to increase AROM into R shoulder flexion to 150 degrees OT Short Term Goal 4 (Week 1): Patient to perform toileting with Min assist  Skilled Therapeutic Interventions/Progress Updates:      Therapy Documentation Precautions:  Precautions Precautions: Fall Recall of Precautions/Restrictions: Intact Precaution/Restrictions Comments: cervical Restrictions Weight Bearing Restrictions Per Provider Order: No General: Pt supine in bed upon OT arrival, agreeable to OT session.  Pain:  5/10 pain reported in Rt shoulder, activity, intermittent rest breaks, distractions provided for pain management, pt reports tolerable to proceed.   ADL: OT providing skilled intervention on ADL retraining in order to increase independence with tasks and increase activity tolerance. Pt completed the following tasks at the current level of assist: Bed mobility: Min A to rise into sitting for trunk elevation Grooming/oral hygiene: set up sitting at sink in W/C for oral hygiene, pt able to maneuver items with BUE UB dressing: Min A over the head, pt able to complete threading arms  LB dressing: Min A for thoroughness over waist, with use of reacher seated EOB with increased time Footwear: total A for TED hose and shoes seated EOB Shower transfer: CGA with RW ambulating  Bathing: Min A with washing hair and buttocks d/t limited reach, OT issuing long sponge for increased independence   Other treatments: OT providing scapular mobilization to Rt shoulder for increased ROM  and decreased pain. OT then placing kpad on shoulder for pain relief.    Pt seated in W/C at end of session with W/C alarm donned, call light within reach and 4Ps assessed.    Therapy/Group: Individual Therapy  Camie Hoe, OTD, OTR/L 01/18/2025, 12:53 PM

## 2025-01-18 NOTE — Progress Notes (Signed)
 "                                                        PROGRESS NOTE   Subjective/Complaints: Feeling much better this morning. Worked on shoulder ROM in therapy yesterday. Found the lidocain patch very helpful. Happy that she's walking  ROS: Patient denies fever, rash, sore throat, blurred vision, dizziness, nausea, vomiting, diarrhea, cough, shortness of breath or chest pain, joint or back/neck pain, headache, or mood change. .   Objective:   DG Cervical Spine 2 or 3 views Result Date: 01/16/2025 CLINICAL DATA:  Pop in neck neck and right shoulder pain EXAM: CERVICAL SPINE - 2-3 VIEW COMPARISON:  01/09/2025, MRI 01/04/2025, CT 01/02/2025 FINDINGS: Posterior spinal fusion hardware extending from C2 through C7 with grossly stable appearance compared with limited intraoperative fluoroscopic views from 01/09/2025. Dens and lateral masses are within normal limits. Multilevel degenerative osteophytes. IMPRESSION: Posterior spinal fusion hardware extending from C2 through C7. No acute osseous abnormality. Electronically Signed   By: Luke Bun M.D.   On: 01/16/2025 19:33   DG Shoulder Right Result Date: 01/16/2025 EXAM: 1 VIEW(S) XRAY OF THE RIGHT SHOULDER 01/16/2025 11:40:00 AM COMPARISON: None available. CLINICAL HISTORY: Reported crepitus of C-spine and shoulder. FINDINGS: BONES AND JOINTS: Glenohumeral joint is normally aligned. No acute fracture. No malalignment. The [facility] joint is unremarkable. SOFT TISSUES: There is soft tissue calcification within the acromiohumeral interval suggesting chronic rotator cuff disease. Visualized lung is unremarkable. IMPRESSION: 1. Soft tissue calcification within the acromiohumeral interval, suggesting chronic rotator cuff disease. Electronically signed by: Evalene Coho MD 01/16/2025 12:06 PM EST RP Workstation: [id number]    Recent Labs    01/16/25 0515  WBC 8.6  HGB 11.7*  HCT 34.8*  PLT 242   Recent Labs    01/16/25 0515  NA 133*  K 4.7  CL  97*  CO2 28  GLUCOSE 136*  BUN 38*  CREATININE 0.93  CALCIUM  9.2    Intake/Output Summary (Last 24 hours) at 01/18/2025 0900 Last data filed at 01/18/2025 0754 Gross per 24 hour  Intake 476 ml  Output --  Net 476 ml        Physical Exam: Vital Signs Blood pressure (!) 147/67, pulse 64, temperature 97.8 F (36.6 C), temperature source Oral, resp. rate 16, height 5' 9 (1.753 m), weight 115.2 kg, last menstrual period 10/28/2014, SpO2 100%.    Constitutional: No distress . Vital signs reviewed. HEENT: NCAT, EOMI, oral membranes moist Neck: supple Cardiovascular: RRR without murmur. No JVD    Respiratory/Chest: CTA Bilaterally without wheezes or rales. Normal effort    GI/Abdomen: BS +, non-tender, non-distended Ext: no clubbing, cyanosis, or edema Psych: pleasant and cooperative  Neurological: Ox3 Skin: posterior neck incision with surgical dressing c/d/i   Musculoskeletal:  Right shoulder still limited. +crepitus during PROM, discomfort Neuro:   Comments: RUE- biceps 4+/5; WE 4+ to 5-/5; Triceps 4+/5; Grip 4/5; FA 3/5 LUE- biceps 4/5; WE 5-/5; Triceps 4/5; Grip 3+/5; and FA 2/5 RLE- HF 4+/5; KE, DF 4/5; PF 5-/5 and EHL 5-/5 LLE- HF 4-/5; otherwise 4/5 throughout --remains stable 1/21    Comments: Ox3  Decreased to Light touch in C6 to T1 on R and C5-T1 on L Intact in torso T2- T12 Intact L1-3 B/L And decreased L4-S1 from neuropathy? B/L Brisk  Hoffman's B/L in Ue's DTR's reduced to 2+ in Ue's and LE's No clonus    Assessment/Plan: 1. Functional deficits which require 3+ hours per day of interdisciplinary therapy in a comprehensive inpatient rehab setting. Physiatrist is providing close team supervision and 24 hour management of active medical problems listed below. Physiatrist and rehab team continue to assess barriers to discharge/monitor patient progress toward functional and medical goals  Care Tool:  Bathing    Body parts bathed by patient: Right arm, Left  arm, Chest, Right upper leg, Left upper leg, Face   Body parts bathed by helper: Abdomen, Front perineal area, Buttocks, Right lower leg, Left lower leg     Bathing assist Assist Level: Maximal Assistance - Patient 24 - 49%     Upper Body Dressing/Undressing Upper body dressing   What is the patient wearing?: Hospital gown only    Upper body assist Assist Level: Minimal Assistance - Patient > 75%    Lower Body Dressing/Undressing Lower body dressing      What is the patient wearing?: Pants     Lower body assist Assist for lower body dressing: Minimal Assistance - Patient > 75%     Toileting Toileting    Toileting assist Assist for toileting: Minimal Assistance - Patient > 75%     Transfers Chair/bed transfer  Transfers assist     Chair/bed transfer assist level: 2 Helpers     Locomotion Ambulation   Ambulation assist   Ambulation activity did not occur: Safety/medical concerns  Assist level: 2 helpers Assistive device: Walker-rolling Max distance: 11.5 ft   Walk 10 feet activity   Assist  Walk 10 feet activity did not occur: Safety/medical concerns  Assist level: 2 helpers Assistive device: Walker-rolling   Walk 50 feet activity   Assist Walk 50 feet with 2 turns activity did not occur: Safety/medical concerns         Walk 150 feet activity   Assist Walk 150 feet activity did not occur: Safety/medical concerns         Walk 10 feet on uneven surface  activity   Assist Walk 10 feet on uneven surfaces activity did not occur: Safety/medical concerns         Wheelchair     Assist Is the patient using a wheelchair?: Yes Type of Wheelchair: Manual    Wheelchair assist level: Dependent - Patient 0% Max wheelchair distance: 5 ft    Wheelchair 50 feet with 2 turns activity    Assist        Assist Level: Dependent - Patient 0%   Wheelchair 150 feet activity     Assist      Assist Level: Dependent - Patient  0%   Blood pressure (!) 147/67, pulse 64, temperature 97.8 F (36.6 C), temperature source Oral, resp. rate 16, height 5' 9 (1.753 m), weight 115.2 kg, last menstrual period 10/28/2014, SpO2 100%.  Medical Problem List and Plan: 1. 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 Supervision to min A with RW            -Continue CIR therapies including PT, OT   2.  Antithrombotics: -DVT/anticoagulation:  Mechanical:  Antiembolism stockings, knee (TED hose) Bilateral lower extremities-- 01/14/25 DVT study neg -antiplatelet therapy: per Dr. Joshua wait at least 5 days for DVT prophylaxis.             -  begin prophylaxis later this week 3. Pain Management: Baclofen  5 mg BID. Oxycodone  and robaxin  PRN.  1/16- will increase Oxy to 10-15 mg q3 hours prn- since pain only went to 8/10 with pain meds- usually 9 or higher when meds wear off. -improved 1/17  1/20 pt appears to have adhesive capsulitis of the right shoulder with referred pain. I suspect when she was working with therapy yesterday that she met resistance and pushed past it where she felt grinding or popping as well as pain. We discussed the importance of improving her ROM and at least maintaining it.   1/21  lidocaine  patch helpful for scapular pain   -pt seems motivated to work on right shoulder ROM 4. Mood/Behavior/Sleep: LCSW to follow for evaluation and support when available.              -antipsychotic agents: Ativan  0.5 mg bid prn   5. Neuropsych/cognition: This patient is capable of making decisions on her own behalf.  6. Skin/Wound Care: Routine pressure relief measures. Monitor incision sites for signs of infection. Hemo Vac removed 1/15.   7. Fluids/Electrolytes/Nutrition: Monitor I&O and weight. Follow up labs CBC/CMP                -Carb modified diet with supplements to continue.   8. Cervical spinal  stenosis: s/p laminectomy by Dr. Joshua 1/12. NSGRY following, continue on decadron  taper.   9. DM2: A1c 7.1. Holding home metformin , glipizide  and januvia .   -Hyperglycemia in the setting of steroids.  SSI. Novolog , Lantus  30 units daily. Novolog  7U with meals scheduled as well.  1/19 CBGs still pretty high although better this am.   increased Lantus  to 35U daily as decadron  tapers down.  1/20-decadron  1mg  q12 untile Friday. Will bump lantus  to 38u. CBG (last 3)  Recent Labs    01/17/25 1645 01/17/25 2036 01/18/25 0552  GLUCAP 233* 219* 146*    10.CKD IIIa: Stable, baseline range 1.1-1.5.  .   1/16- Cr 0.8 and BUN 30- is dry- will push fluids- and recheck Monday  1/19 BUN up to 38---push fluids, she should be able to take in plenty on her own.  1/21 recheck labs Thursday 11. Acute bronchitis: CT from 1/1 without acute intrathoracic pathology. Completed 10 day course of doxycycline  1/15.               -encourage ins spiro.   12. HLD: Lipitor  80 mg   13. HTN: Holding metoprolol , lisinopril , triamterene -HCTZ. Monitor bp per protocol for need to resume.    1/19 bp controlled  Vitals:   01/15/25 0523 01/15/25 0851 01/15/25 1552 01/15/25 1947  BP: 130/74 130/74 111/79 129/72   01/16/25 0312 01/16/25 1556 01/16/25 1934 01/17/25 0254  BP: (!) 125/51 131/71 (!) 115/50 132/77   01/17/25 1357 01/17/25 1934 01/18/25 0304 01/18/25 0817  BP: (!) 124/96 (!) 132/54 (!) 147/67 (!) 147/67    14. Neurogenic bladder: Foley removed on 1/14  monitor PVRs d/t potential for urinary retention.  1/19 PVR's low/absent. Occasional incontinence  -timed emptying for now q4 while awake  15. Neurogenic bowel: Continue Senna bid.  -Will give Sorbitol  45 cc x1 this afternoon- if no BM today, will give 60cc tomorrow and SSE. -1/16- had huge BM on toilet- but still feels like could go- will increase Senna to 2 tabs BID since increasing bowel meds- and keep on Sorbitol  prn if needed- d/c'd Bowel program for now  since went on toilet- but might need to come back if  starts having incontinence.  -01/14/25 LBM 1/15, pt stated no bowel program yesterday. Will restart suppository, continue bowel program, advised pt to use sorbitol  today before dinner as well to encourage better output tonight with program. Could see if she can d/c bowel program again later.  -1/19-21 had results last night with BP, but stools liquid  -on no stool softeners currently  -maintain PM bowel program for now.   16. Nausea and reflux--improved  -added protonix  daily  -prn tums, dc'ed maalox       LOS: 6 days A FACE TO FACE EVALUATION WAS PERFORMED  April Warren 01/18/2025, 9:00 AM     "

## 2025-01-19 DIAGNOSIS — K592 Neurogenic bowel, not elsewhere classified: Secondary | ICD-10-CM | POA: Diagnosis not present

## 2025-01-19 DIAGNOSIS — N1831 Chronic kidney disease, stage 3a: Secondary | ICD-10-CM | POA: Diagnosis not present

## 2025-01-19 DIAGNOSIS — N319 Neuromuscular dysfunction of bladder, unspecified: Secondary | ICD-10-CM | POA: Diagnosis not present

## 2025-01-19 DIAGNOSIS — I10 Essential (primary) hypertension: Secondary | ICD-10-CM | POA: Diagnosis not present

## 2025-01-19 DIAGNOSIS — N179 Acute kidney failure, unspecified: Secondary | ICD-10-CM | POA: Diagnosis not present

## 2025-01-19 DIAGNOSIS — G8250 Quadriplegia, unspecified: Secondary | ICD-10-CM | POA: Diagnosis not present

## 2025-01-19 LAB — CBC
HCT: 35.5 % — ABNORMAL LOW (ref 36.0–46.0)
Hemoglobin: 11.9 g/dL — ABNORMAL LOW (ref 12.0–15.0)
MCH: 30.7 pg (ref 26.0–34.0)
MCHC: 33.5 g/dL (ref 30.0–36.0)
MCV: 91.7 fL (ref 80.0–100.0)
Platelets: 243 K/uL (ref 150–400)
RBC: 3.87 MIL/uL (ref 3.87–5.11)
RDW: 12.2 % (ref 11.5–15.5)
WBC: 8 K/uL (ref 4.0–10.5)
nRBC: 0 % (ref 0.0–0.2)

## 2025-01-19 LAB — GLUCOSE, CAPILLARY
Glucose-Capillary: 152 mg/dL — ABNORMAL HIGH (ref 70–99)
Glucose-Capillary: 171 mg/dL — ABNORMAL HIGH (ref 70–99)
Glucose-Capillary: 188 mg/dL — ABNORMAL HIGH (ref 70–99)
Glucose-Capillary: 231 mg/dL — ABNORMAL HIGH (ref 70–99)

## 2025-01-19 LAB — BASIC METABOLIC PANEL WITH GFR
Anion gap: 10 (ref 5–15)
BUN: 35 mg/dL — ABNORMAL HIGH (ref 6–20)
CO2: 25 mmol/L (ref 22–32)
Calcium: 9.4 mg/dL (ref 8.9–10.3)
Chloride: 101 mmol/L (ref 98–111)
Creatinine, Ser: 0.86 mg/dL (ref 0.44–1.00)
GFR, Estimated: >60 mL/min
Glucose, Bld: 158 mg/dL — ABNORMAL HIGH (ref 70–99)
Potassium: 4.7 mmol/L (ref 3.5–5.1)
Sodium: 136 mmol/L (ref 135–145)

## 2025-01-19 MED ORDER — DIPHENHYDRAMINE HCL 25 MG PO CAPS: 25.0000 mg | ORAL_CAPSULE | Freq: Four times a day (QID) | ORAL | Status: AC | PRN

## 2025-01-19 MED ORDER — PANTOPRAZOLE SODIUM 40 MG PO TBEC
40.0000 mg | DELAYED_RELEASE_TABLET | Freq: Two times a day (BID) | ORAL | Status: AC
  Administered 2025-01-19 – 2025-02-03 (×31): 40 mg via ORAL
  Filled 2025-01-19 (×31): qty 1

## 2025-01-19 NOTE — Progress Notes (Signed)
 Occupational Therapy Session Note  Patient Details  Name: April Warren MRN: [id number] Date of Birth: June 16, 1971  Today's Date: 01/19/2025 OT Individual Time: 0800-0830 & 1100-1200 OT Individual Time Calculation (min): 30 min & 60 min    Short Term Goals: Week 1:  OT Short Term Goal 1 (Week 1): Patient to perform LE dressing with Min assist OT Short Term Goal 2 (Week 1): Patient to perform tub bench transfer with Mod Assist OT Short Term Goal 3 (Week 1): Patient to increase AROM into R shoulder flexion to 150 degrees OT Short Term Goal 4 (Week 1): Patient to perform toileting with Min assist  Skilled Therapeutic Interventions/Progress Updates:      Therapy Documentation Precautions:  Precautions Precautions: Fall Recall of Precautions/Restrictions: Intact Precaution/Restrictions Comments: cervical Restrictions Weight Bearing Restrictions Per Provider Order: No Session 1 General: Pt supine in bed upon OT arrival, agreeable to OT session.  Pain:  5/10 pain reported in Rt shoulder, activity, intermittent rest breaks, distractions provided for pain management, pt reports tolerable to proceed.   ADL: OT providing skilled intervention on ADL retraining in order to increase independence with tasks and increase activity tolerance. Pt completed the following tasks at the current level of assist: Bed mobility: Min A for trunk elevation from flat bed  Grooming/oral hygiene: standing at sink for hand and oral hygiene for ~2 min no LOB  Toilet transfer: CGA with RW ambulating from bed with no LOB, able to navigate incline into bathroom without LOB or knee buckle Toileting: CGA in unsupported standing for pants management, pt voiding urine, able to complete hygiene UB dressing: SBA in sitting for doffing/donning overhead shirt  LB dressing: CGA in unsupported standing to manage over waist, able to use reacher to don underwear and pants  Other Treatments: OT completing scapular mobilizations  and  PROM of Rt shoulder in preparation for ADL activities. Pt tolerating well and reporting decrease of pain.    Pt seated in recliner at end of session with W/C alarm donned, call light within reach and 4Ps assessed.    Session 2 General: Pt seated in recliner upon OT arrival, agreeable to OT.  Pain:  5/10 pain reported in Rt knee, activity, intermittent rest breaks, distractions provided for pain management, pt reports tolerable to proceed.   Exercises: Pt participated in individual BUE and BLE dance exercises in social setting including reaching out of BOS, BUE and BLE ROM in all planes while seated in W/C in order to increase generalized strength/endurance to complete ADLs with increased independence. Pt with no noted LOB/SOB while participating. Pt required no VC to participate and able to tolerate well.    Pt seated in recliner at end of session with W/C alarm donned, call light within reach and 4Ps assessed.    Therapy/Group: Individual Therapy  Camie Hoe, OTD, OTR/L 01/19/2025, 12:38 PM

## 2025-01-19 NOTE — Progress Notes (Signed)
 Recreational Therapy Session Note  Patient Details  Name: Keilani Terrance MRN: [id number] Date of Birth: Dec 22, 1971 Today's Date: 01/19/2025   Goal: Pt will actively participate in stress management/coping discussion.  MET  Pain: no c/o Skilled Therapeutic Interventions/Progress Updates:Pt participated in stress managment/coping discussion today, husband present.   Pt education/discussion focused on stress exploration including factors that contribute to stress, factors that protect against stress and potential coping strategies.  4 coping strategies were introduced with plans to discuss in detail during next session.  Pt easily engaged in discussion defining stress, giving example of stress and categories they may fit in.  Handout provided. Pt appreciative of this session.  Therapy/Group: Individual Therapy  Azaiah Mello 01/19/2025, 2:16 PM

## 2025-01-19 NOTE — Progress Notes (Signed)
 Physical Therapy Session Note  Patient Details  Name: April Warren MRN: [id number] Date of Birth: [birth year]  Today's Date: 01/19/2025 PT Individual Time: 0915-1015 PT Individual Time Calculation (min): 60 min   Short Term Goals: Week 1:  PT Short Term Goal 1 (Week 1): pt will stand >1 min without knee buckling PT Short Term Goal 2 (Week 1): pt will ambulate 20 ft PT Short Term Goal 3 (Week 1): Pt will perform bed mobility with min a  Skilled Therapeutic Interventions/Progress Updates:    Pt received in recliner and agreeable to therapy.  No complaint of pain. Kpad in place at start and end of session for R shoulder pain management.   Pt ambulated ~130 ft with CGA and w/c follow in case of fatigue. Note knee hyperextension with fatigue, R>L, but no formal knee buckling.   Pt then participated in TKE mini squats with green band around knees to promote TKE strength and proprioception/control. Pt fatigues quickly with this activity, 2 x 12. Pt then reports urge for BM, so return to room, ambulatory transfer to bathroom, incr hyperextension noted.  CGA for clothing management, tot a for hygiene.  Pt ambulated to day room, ~130 ft in same manner, noted increased hyperextension with fatigue. Pt then participated in step taps to short step with LLE for R single leg stance and biased weight shift with R foot on step for L single leg stance. Cues and instruction for keeping knees soft to combat hyperextension. Pt returned to room and to recliner in same manner, was left with all needs in reach and alarm active.     Therapy Documentation Precautions:  Precautions Precautions: Fall Recall of Precautions/Restrictions: Intact Precaution/Restrictions Comments: cervical Restrictions Weight Bearing Restrictions Per Provider Order: No General:       Therapy/Group: Individual Therapy  April Warren 01/19/2025, 9:41 AM

## 2025-01-19 NOTE — Progress Notes (Signed)
 "                                                        PROGRESS NOTE   Subjective/Complaints: In good spirits. Had some reflux and nausea recur yesterday, sl this morning again. Improving pain in right shoulder. Likes to use kpad. Otherwise feeling well.   ROS: Patient denies fever, rash, sore throat, blurred vision, dizziness, nausea, vomiting, diarrhea, cough, shortness of breath or chest pain,   headache, or mood change. .   Objective:   No results found.   Recent Labs    01/19/25 0602  WBC 8.0  HGB 11.9*  HCT 35.5*  PLT 243   Recent Labs    01/19/25 0602  NA 136  K 4.7  CL 101  CO2 25  GLUCOSE 158*  BUN 35*  CREATININE 0.86  CALCIUM  9.4    Intake/Output Summary (Last 24 hours) at 01/19/2025 0857 Last data filed at 01/19/2025 0749 Gross per 24 hour  Intake 480 ml  Output 0 ml  Net 480 ml        Physical Exam: Vital Signs Blood pressure 133/71, pulse (!) 58, temperature 97.7 F (36.5 C), temperature source Oral, resp. rate 18, height 5' 9 (1.753 m), weight 115.2 kg, last menstrual period 10/28/2014, SpO2 98%.    Constitutional: No distress . Vital signs reviewed. HEENT: NCAT, EOMI, oral membranes moist Neck: supple Cardiovascular: RRR without murmur. No JVD    Respiratory/Chest: CTA Bilaterally without wheezes or rales. Normal effort    GI/Abdomen: BS +, non-tender, non-distended Ext: no clubbing, cyanosis, or edema Psych: pleasant and cooperative  Neurological: Ox3 Skin: posterior neck incision with surgical dressing c/d/i   Musculoskeletal:  Right shoulder still limited but improving IR/ER. 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 B/L Brisk Hoffman's B/L in Ue's DTR's reduced to 2+ in Ue's and LE's No clonus  --stable 1/22  Assessment/Plan: 1. Functional deficits which require 3+ hours per day of interdisciplinary therapy in a comprehensive inpatient rehab setting. Physiatrist is providing close team supervision and 24 hour management of active medical problems listed below. Physiatrist and rehab team continue to assess barriers to discharge/monitor patient progress toward functional and medical goals  Care Tool:  Bathing    Body parts bathed by patient: Right arm, Left arm, Chest, Right upper leg, Left upper leg, Face   Body parts bathed by helper: Abdomen, Front perineal area, Buttocks, Right lower leg, Left lower leg     Bathing assist Assist Level: Maximal Assistance - Patient 24 - 49%     Upper Body Dressing/Undressing Upper body dressing   What is the patient wearing?: Hospital gown only    Upper body assist Assist Level: Minimal Assistance - Patient > 75%    Lower Body Dressing/Undressing Lower body dressing      What is the patient wearing?:  Pants     Lower body assist Assist for lower body dressing: Minimal Assistance - Patient > 75%     Toileting Toileting    Toileting assist Assist for toileting: Minimal Assistance - Patient > 75%     Transfers Chair/bed transfer  Transfers assist     Chair/bed transfer assist level: 2 Helpers     Locomotion Ambulation   Ambulation assist   Ambulation activity did not occur: Safety/medical concerns  Assist level: 2 helpers Assistive device: Walker-rolling Max distance: 11.5 ft   Walk 10 feet activity   Assist  Walk 10 feet activity did not occur: Safety/medical concerns  Assist level: 2 helpers Assistive device: Walker-rolling   Walk 50 feet activity   Assist Walk 50 feet with 2 turns activity did not occur: Safety/medical concerns          Walk 150 feet activity   Assist Walk 150 feet activity did not occur: Safety/medical concerns         Walk 10 feet on uneven surface  activity   Assist Walk 10 feet on uneven surfaces activity did not occur: Safety/medical concerns         Wheelchair     Assist Is the patient using a wheelchair?: Yes Type of Wheelchair: Manual    Wheelchair assist level: Dependent - Patient 0% Max wheelchair distance: 5 ft    Wheelchair 50 feet with 2 turns activity    Assist        Assist Level: Dependent - Patient 0%   Wheelchair 150 feet activity     Assist      Assist Level: Dependent - Patient 0%   Blood pressure 133/71, pulse (!) 58, temperature 97.7 F (36.5 C), temperature source Oral, resp. rate 18, height 5' 9 (1.753 m), weight 115.2 kg, last menstrual period 10/28/2014, SpO2 98%.  Medical Problem List and Plan: 1. 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 Supervision to min A with RW            -Continue CIR therapies including PT, OT   2.  Antithrombotics: -DVT/anticoagulation:  Mechanical:  Antiembolism stockings, knee (TED hose) Bilateral lower extremities-- 01/14/25 DVT study neg -antiplatelet therapy: per Dr. Joshua wait at least 5 days for DVT prophylaxis.             -begin prophylaxis potentially tomorrow. Will d/w pt first 3. Pain Management: Baclofen  5 mg BID. Oxycodone  and robaxin  PRN.  1/16- will increase Oxy to 10-15 mg q3 hours prn- since pain only went to 8/10 with pain meds- usually 9 or higher when meds wear off. -improved 1/17  1/20 pt appears to have adhesive capsulitis of the right shoulder with referred pain. I suspect when she was working with therapy yesterday that she met resistance and pushed past it where she felt grinding or popping as well as pain. We discussed the importance of improving her ROM and at  least maintaining it.   1/21-22  lidocaine  patch helpful for scapular pain   -continue to work on right shoulder ROM 4. Mood/Behavior/Sleep: LCSW to follow for evaluation and support when available.              -antipsychotic agents: Ativan  0.5 mg bid prn   5. Neuropsych/cognition: This patient is capable of making decisions on her own behalf.  6. Skin/Wound Care:  Routine pressure relief measures. Monitor incision sites for signs of infection. Hemo Vac removed 1/15.   7. Fluids/Electrolytes/Nutrition: Monitor I&O and weight. Follow up labs CBC/CMP                -Carb modified diet with supplements to continue.   8. Cervical spinal stenosis: s/p laminectomy by Dr. Joshua 1/12. NSGRY following, continue on decadron  taper.   9. DM2: A1c 7.1. Holding home metformin , glipizide  and januvia .   -Hyperglycemia in the setting of steroids.  SSI. Novolog , Lantus  30 units daily. Novolog  7U with meals scheduled as well.  1/19 CBGs still pretty high although better this am.   increased Lantus  to 35U daily as decadron  tapers down.  1/22-decadron  1mg  q12 until Friday. increased lantus  to 38u. CBG (last 3)  Recent Labs    01/18/25 1755 01/18/25 2014 01/19/25 0557  GLUCAP 219* 297* 152*    10.CKD IIIa: Stable, baseline range 1.1-1.5.  .   1/16- Cr 0.8 and BUN 30- is dry- will push fluids- and recheck Monday  1/19 BUN up to 38---push fluids, she should be able to take in plenty on her own.  1/22 labs reviewed and consistent 11. Acute bronchitis: CT from 1/1 without acute intrathoracic pathology. Completed 10 day course of doxycycline  1/15.               -encourage ins spiro.   12. HLD: Lipitor  80 mg   13. HTN: Holding metoprolol , lisinopril , triamterene -HCTZ. Monitor bp per protocol for need to resume.    1/22 bp controlled  Vitals:   01/15/25 1947 01/16/25 0312 01/16/25 1556 01/16/25 1934  BP: 129/72 (!) 125/51 131/71 (!) 115/50   01/17/25 0254 01/17/25 1357 01/17/25 1934 01/18/25 0304  BP:  132/77 (!) 124/96 (!) 132/54 (!) 147/67   01/18/25 0817 01/18/25 1247 01/18/25 2013 01/19/25 0439  BP: (!) 147/67 119/73 (!) 117/56 133/71    14. Neurogenic bladder: Foley removed on 1/14  monitor PVRs d/t potential for urinary retention.  1/19 PVR's low/absent. Occasional incontinence  -timed emptying for now q4 while awake  15. Neurogenic bowel: Continue Senna bid.  -Will give Sorbitol  45 cc x1 this afternoon- if no BM today, will give 60cc tomorrow and SSE. -1/16- had huge BM on toilet- but still feels like could go- will increase Senna to 2 tabs BID since increasing bowel meds- and keep on Sorbitol  prn if needed- d/c'd Bowel program for now since went on toilet- but might need to come back if starts having incontinence.  -01/14/25 LBM 1/15, pt stated no bowel program yesterday. Will restart suppository, continue bowel program, advised pt to use sorbitol  today before dinner as well to encourage better output tonight with program. Could see if she can d/c bowel program again later.  -1/22 had results last nightat 0000 with BP, but stools still soft  -on no stool softeners currently  -maintain PM bowel program   16. Nausea and reflux--improved  -added protonix  daily--increase to bid 1/22  -prn tums, dc'ed maalox       LOS: 7 days A FACE TO FACE EVALUATION WAS PERFORMED  Arthea ONEIDA Gunther 01/19/2025, 8:57 AM     "

## 2025-01-19 NOTE — Plan of Care (Signed)
" °  Problem: Consults Goal: RH SPINAL CORD INJURY PATIENT EDUCATION Description:  See Patient Education module for education specifics.  Outcome: Progressing   Problem: SCI BOWEL ELIMINATION Goal: RH STG MANAGE BOWEL WITH ASSISTANCE Description: STG Manage Bowel with bowel program min Assistance. Outcome: Progressing   Problem: SCI BLADDER ELIMINATION Goal: RH STG MANAGE BLADDER WITH ASSISTANCE Description: STG Manage Bladder With bladder program min Assistance Outcome: Progressing   Problem: RH SKIN INTEGRITY Goal: RH STG SKIN FREE OF INFECTION/BREAKDOWN Description: Manage skin free of infection with min assistance Outcome: Progressing   Problem: RH SAFETY Goal: RH STG ADHERE TO SAFETY PRECAUTIONS W/ASSISTANCE/DEVICE Description: STG Adhere to Safety Precautions With min Assistance/Device. Outcome: Progressing   "

## 2025-01-19 NOTE — Progress Notes (Shared)
 IP Rehab Bowel Program Documentation   Bowel Program Start time [phone]   Dig Stim Indicated? Yes  Dig Stim Prior to Suppository or mini Enema X 1   Output from dig stim:   Ordered intervention: Suppository Yes , mini enema No ,   Repeat dig stim after Suppository or Mini enema  X   Output?    Bowel Program Complete? No , handoff given yes   Patient Tolerated? Yes

## 2025-01-19 NOTE — Progress Notes (Signed)
 Physical Therapy Session Note  Patient Details  Name: April Warren MRN: [id number] Date of Birth: [birth year]  Today's Date: 01/19/2025 PT Individual Time: 1400-1445 PT Individual Time Calculation (min): 45 min   Short Term Goals: Week 1:  PT Short Term Goal 1 (Week 1): pt will stand >1 min without knee buckling PT Short Term Goal 2 (Week 1): pt will ambulate 20 ft PT Short Term Goal 3 (Week 1): Pt will perform bed mobility with min a  Skilled Therapeutic Interventions/Progress Updates: Pt presented in recliner with friend present agreeable to therapy. Pt c/o pain 5/10 in R shoulder, noted significant trigger points and TTP at upper trap/levator. PTA providing cross friction massage at R trap then provided education to pt in use of theracane. Pt was able to use cane to reach many of the trigger points present. Discussed avoiding incision site with pt verbalizing understanding.  Pt then stood with CGA and ambulated ~87ft with RW and chair follow to day room. Pt required cues for increased BOS and decreased hyperextension. At mat pt participated in standing balance activities including ball taps and tosses without AD. On last round pt stood with RLE on 2in step. Pt able to maintain balance with CGA with noted mild postural corrections. Pt then ambulated to NuStep and participated in hills program L3-6 x 10 min. Pt completed 340 steps at 32 SPM and 1.5 METs. After seated rest pt ambulated back to room in same manner as prior. Pt returned to recliner at end of session and left with call bell within reach and needs met.  340 steps 32 SPM 1.5 METs     Therapy Documentation Precautions:  Precautions Precautions: Fall Recall of Precautions/Restrictions: Intact Precaution/Restrictions Comments: cervical Restrictions Weight Bearing Restrictions Per Provider Order: No   Therapy/Group: Individual Therapy  Jaqulyn Chancellor 01/19/2025, 3:12 PM

## 2025-01-20 ENCOUNTER — Encounter (HOSPITAL_COMMUNITY): Payer: Self-pay | Admitting: Neurological Surgery

## 2025-01-20 LAB — GLUCOSE, CAPILLARY
Glucose-Capillary: 109 mg/dL — ABNORMAL HIGH (ref 70–99)
Glucose-Capillary: 170 mg/dL — ABNORMAL HIGH (ref 70–99)
Glucose-Capillary: 179 mg/dL — ABNORMAL HIGH (ref 70–99)
Glucose-Capillary: 183 mg/dL — ABNORMAL HIGH (ref 70–99)
Glucose-Capillary: 187 mg/dL — ABNORMAL HIGH (ref 70–99)

## 2025-01-20 MED ORDER — ENOXAPARIN SODIUM 40 MG/0.4ML IJ SOSY
40.0000 mg | PREFILLED_SYRINGE | INTRAMUSCULAR | Status: DC
  Administered 2025-01-20 – 2025-02-03 (×15): 40 mg via SUBCUTANEOUS
  Filled 2025-01-20 (×15): qty 0.4

## 2025-01-20 MED ORDER — INSULIN GLARGINE 100 UNIT/ML ~~LOC~~ SOLN
38.0000 [IU] | Freq: Every day | SUBCUTANEOUS | Status: AC
  Administered 2025-01-21 – 2025-02-03 (×14): 38 [IU] via SUBCUTANEOUS
  Filled 2025-01-20 (×16): qty 0.38

## 2025-01-20 NOTE — Progress Notes (Signed)
 Occupational Therapy Session Note  Patient Details  Name: April Warren MRN: [id number] Date of Birth: [birth year]  Today's Date: 01/20/2025 OT Individual Time: 9184-9087 OT Individual Time Calculation (min): 57 min    Short Term Goals: Week 1:  OT Short Term Goal 1 (Week 1): Patient to perform LE dressing with Min assist OT Short Term Goal 2 (Week 1): Patient to perform tub bench transfer with Mod Assist OT Short Term Goal 3 (Week 1): Patient to increase AROM into R shoulder flexion to 150 degrees OT Short Term Goal 4 (Week 1): Patient to perform toileting with Min assist  Skilled Therapeutic Interventions/Progress Updates:    Pt resting in bed upon arrival. Skilled OT intervention with focus on bed mobility, sit<>stand, standing balance, toileting, funcitonal amb with RW, and activity tolerance to increase independence with BADLs. Pt requires min A for supine>sit EOB (air mattress.) Pt amb with RW to bathroom to use toiet. Pt donned pants seated on BSC using reacher to assist. Toileting and LB dressing with CGA. Pt returned to room and stood at sink to complete grooming tasks. Pt completed standing activities in gym; amb with RW to card board and removing card before returning to w/c and sitting. Sit<>stand and amb with CGA. Pt repeated task X 9. Pt also returned cards and placed on board. Table tasks for [facility] and strengthening with theraputty and small beads. Pt returned to room and sat in recliner. All needs within reach.   Therapy Documentation Precautions:  Precautions Precautions: Fall Recall of Precautions/Restrictions: Intact Precaution/Restrictions Comments: cervical Restrictions Weight Bearing Restrictions Per Provider Order: No Pain: Pt reports her RUE/shoulder continues to improve  Therapy/Group: Individual Therapy  [name] 01/20/2025, 9:14 AM

## 2025-01-20 NOTE — Plan of Care (Signed)
" °  Problem: Consults Goal: RH SPINAL CORD INJURY PATIENT EDUCATION Description:  See Patient Education module for education specifics.  Outcome: Progressing   Problem: SCI BOWEL ELIMINATION Goal: RH STG MANAGE BOWEL WITH ASSISTANCE Description: STG Manage Bowel with bowel program min Assistance. Outcome: Progressing   Problem: SCI BLADDER ELIMINATION Goal: RH STG MANAGE BLADDER WITH ASSISTANCE Description: STG Manage Bladder With bladder program min Assistance Outcome: Progressing   Problem: RH SKIN INTEGRITY Goal: RH STG SKIN FREE OF INFECTION/BREAKDOWN Description: Manage skin free of infection with min assistance Outcome: Progressing   Problem: RH SAFETY Goal: RH STG ADHERE TO SAFETY PRECAUTIONS W/ASSISTANCE/DEVICE Description: STG Adhere to Safety Precautions With min Assistance/Device. Outcome: Progressing   Problem: RH SAFETY Goal: RH STG ADHERE TO SAFETY PRECAUTIONS W/ASSISTANCE/DEVICE Description: STG Adhere to Safety Precautions With min Assistance/Device. Outcome: Progressing   Problem: RH SKIN INTEGRITY Goal: RH STG SKIN FREE OF INFECTION/BREAKDOWN Description: Manage skin free of infection with min assistance Outcome: Progressing   Problem: RH PAIN MANAGEMENT Goal: RH STG PAIN MANAGED AT OR BELOW PT'S PAIN GOAL Description: <4 w/ prns Outcome: Progressing   Problem: RH SAFETY Goal: RH STG ADHERE TO SAFETY PRECAUTIONS W/ASSISTANCE/DEVICE Description: STG Adhere to Safety Precautions With min Assistance/Device. Outcome: Progressing   Problem: RH KNOWLEDGE DEFICIT SCI Goal: RH STG INCREASE KNOWLEDGE OF SELF CARE AFTER SCI Description: Manage increase knowledge of self care with min assistance from spouse using educational materials provided Outcome: Progressing   "

## 2025-01-20 NOTE — Plan of Care (Signed)
" °  Problem: Consults Goal: RH SPINAL CORD INJURY PATIENT EDUCATION Description:  See Patient Education module for education specifics.  Outcome: Progressing   Problem: SCI BOWEL ELIMINATION Goal: RH STG MANAGE BOWEL WITH ASSISTANCE Description: STG Manage Bowel with bowel program min Assistance. Outcome: Progressing   Problem: SCI BLADDER ELIMINATION Goal: RH STG MANAGE BLADDER WITH ASSISTANCE Description: STG Manage Bladder With bladder program min Assistance Outcome: Progressing   Problem: RH SKIN INTEGRITY Goal: RH STG SKIN FREE OF INFECTION/BREAKDOWN Description: Manage skin free of infection with min assistance Outcome: Progressing   Problem: RH SAFETY Goal: RH STG ADHERE TO SAFETY PRECAUTIONS W/ASSISTANCE/DEVICE Description: STG Adhere to Safety Precautions With min Assistance/Device. Outcome: Progressing   Problem: RH PAIN MANAGEMENT Goal: RH STG PAIN MANAGED AT OR BELOW PT'S PAIN GOAL Description: <4 w/ prns Outcome: Progressing   Problem: RH KNOWLEDGE DEFICIT SCI Goal: RH STG INCREASE KNOWLEDGE OF SELF CARE AFTER SCI Description: Manage increase knowledge of self care with min assistance from spouse using educational materials provided Outcome: Progressing   "

## 2025-01-20 NOTE — Progress Notes (Signed)
 Patient refused bowel program tonight. Patient stated had a large loose stool over the toilet during the day and verbalized understanding the important of bowel program

## 2025-01-20 NOTE — Progress Notes (Addendum)
 "                                                        PROGRESS NOTE   Subjective/Complaints: Feeling well this am. Nausea and reflux gone. Right shoulder ROM improving.   ROS: Patient denies fever, rash, sore throat, blurred vision, dizziness, nausea, vomiting, diarrhea, cough, shortness of breath or chest pain,  headache, or mood change.    Objective:   No results found.   Recent Labs    01/19/25 0602  WBC 8.0  HGB 11.9*  HCT 35.5*  PLT 243   Recent Labs    01/19/25 0602  NA 136  K 4.7  CL 101  CO2 25  GLUCOSE 158*  BUN 35*  CREATININE 0.86  CALCIUM  9.4    Intake/Output Summary (Last 24 hours) at 01/20/2025 1019 Last data filed at 01/20/2025 0700 Gross per 24 hour  Intake 716 ml  Output --  Net 716 ml        Physical Exam: Vital Signs Blood pressure 135/72, pulse 66, temperature 97.9 F (36.6 C), resp. rate 18, height 5' 9 (1.753 m), weight 115.2 kg, last menstrual period 10/28/2014, SpO2 100%.    Constitutional: No distress . Vital signs reviewed. HEENT: NCAT, EOMI, oral membranes moist Neck: supple Cardiovascular: RRR without murmur. No JVD    Respiratory/Chest: CTA Bilaterally without wheezes or rales. Normal effort    GI/Abdomen: BS +, non-tender, non-distended Ext: no clubbing, cyanosis, or edema Psych: pleasant and cooperative  Neurological: Ox3 Skin: posterior neck incision with surgical dressing c/d/i   Musculoskeletal:  Right shoulder still limited but improving IR/ER. 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 B/L Brisk Hoffman's B/L in  Ue's DTR's reduced to 2+ in Ue's and LE's. Stable appearance 1/23  Assessment/Plan: 1. Functional deficits which require 3+ hours per day of interdisciplinary therapy in a comprehensive inpatient rehab setting. Physiatrist is providing close team supervision and 24 hour management of active medical problems listed below. Physiatrist and rehab team continue to assess barriers to discharge/monitor patient progress toward functional and medical goals  Care Tool:  Bathing    Body parts bathed by patient: Right arm, Left arm, Chest, Right upper leg, Left upper leg, Face   Body parts bathed by helper: Abdomen, Front perineal area, Buttocks, Right lower leg, Left lower leg     Bathing assist Assist Level: Maximal Assistance - Patient 24 - 49%     Upper Body Dressing/Undressing Upper body dressing   What is the patient wearing?: Hospital gown only    Upper body assist Assist Level: Minimal Assistance - Patient > 75%    Lower Body Dressing/Undressing Lower body dressing      What is the patient wearing?: Pants, Incontinence brief     Lower body assist Assist for lower body dressing: Contact Guard/Touching assist  Toileting Toileting    Toileting assist Assist for toileting: Supervision/Verbal cueing     Transfers Chair/bed transfer  Transfers assist     Chair/bed transfer assist level: 2 Helpers     Locomotion Ambulation   Ambulation assist   Ambulation activity did not occur: Safety/medical concerns  Assist level: 2 helpers Assistive device: Walker-rolling Max distance: 11.5 ft   Walk 10 feet activity   Assist  Walk 10 feet activity did not occur: Safety/medical concerns  Assist level: 2 helpers Assistive device: Walker-rolling   Walk 50 feet activity   Assist Walk 50 feet with 2 turns activity did not occur: Safety/medical concerns         Walk 150 feet activity   Assist Walk 150 feet activity did not occur: Safety/medical concerns          Walk 10 feet on uneven surface  activity   Assist Walk 10 feet on uneven surfaces activity did not occur: Safety/medical concerns         Wheelchair     Assist Is the patient using a wheelchair?: Yes Type of Wheelchair: Manual    Wheelchair assist level: Dependent - Patient 0% Max wheelchair distance: 5 ft    Wheelchair 50 feet with 2 turns activity    Assist        Assist Level: Dependent - Patient 0%   Wheelchair 150 feet activity     Assist      Assist Level: Dependent - Patient 0%   Blood pressure 135/72, pulse 66, temperature 97.9 F (36.6 C), resp. rate 18, height 5' 9 (1.753 m), weight 115.2 kg, last menstrual period 10/28/2014, SpO2 100%.  Medical Problem List and Plan: 1. 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 Supervision to min A with RW          -Continue CIR therapies including PT, OT    2.  Antithrombotics: -DVT/anticoagulation:  Mechanical:  Antiembolism stockings, knee (TED hose) Bilateral lower extremities-- 01/14/25 DVT study neg -antiplatelet therapy:              -begin daily lovenox  40mg  daily  3. Pain Management: Baclofen  5 mg BID. Oxycodone  and robaxin  PRN.  1/16- will increase Oxy to 10-15 mg q3 hours prn- since pain only went to 8/10 with pain meds- usually 9 or higher when meds wear off. -improved 1/17  1/20 pt appears to have adhesive capsulitis of the right shoulder with referred pain. I suspect when she was working with therapy yesterday that she met resistance and pushed past it where she felt grinding or popping as well as pain. We discussed the importance of improving her ROM and at least maintaining it.   1/23 lidocaine  patch helpful for scapular pain   -pt continues to work on right shoulder ROM  4. Mood/Behavior/Sleep: LCSW to follow for evaluation and support when available.               -antipsychotic agents: Ativan  0.5 mg bid prn   5. Neuropsych/cognition: This patient is capable of making decisions on her own behalf.  6. Skin/Wound Care: Routine pressure relief measures. Monitor incision sites for signs of infection. Hemo Vac removed 1/15.    1/23 --postop dressing soiled, loosening. New dry dressing place. Small open area at the superior aspect of the wound 7. Fluids/Electrolytes/Nutrition: Monitor I&O and weight.  Follow up labs CBC/CMP                -Carb modified diet with supplements to continue.   8. Cervical spinal stenosis: s/p laminectomy by Dr. Joshua 1/12. NSGRY following, continue on decadron  taper.   9. DM2: A1c 7.1. Holding home metformin , glipizide  and januvia .   -Hyperglycemia in the setting of steroids.  SSI. Novolog , Lantus  30 units daily. Novolog  7U with meals scheduled as well.  1/19 CBGs still pretty high although better this am.   increased Lantus  to 35U daily as decadron  tapers down.  1/23-decadron  1mg  every day for 3 days then off -sugars still quite high. Will bump lantus  to 38u daily.  -continue same novolog  7u with meals CBG (last 3)  Recent Labs    01/19/25 1625 01/19/25 2024 01/20/25 0637  GLUCAP 188* 231* 109*    10.CKD IIIa: Stable, baseline range 1.1-1.5.  .   1/16- Cr 0.8 and BUN 30- is dry- will push fluids- and recheck Monday  1/19 BUN up to 38---push fluids, she should be able to take in plenty on her own.  1/22 labs reviewed and consistent--f/u Monday  11. Acute bronchitis: CT from 1/1 without acute intrathoracic pathology. Completed 10 day course of doxycycline  1/15.               -encourage ins spiro.   12. HLD: Lipitor  80 mg   13. HTN: Holding metoprolol , lisinopril , triamterene -HCTZ. Monitor bp per protocol for need to resume.    1/22 bp controlled  Vitals:   01/16/25 1934 01/17/25 0254 01/17/25 1357 01/17/25 1934  BP: (!) 115/50 132/77 (!) 124/96 (!) 132/54   01/18/25 0304 01/18/25 0817 01/18/25 1247 01/18/25 2013   BP: (!) 147/67 (!) 147/67 119/73 (!) 117/56   01/19/25 0439 01/19/25 1240 01/19/25 1956 01/20/25 0638  BP: 133/71 120/62 (!) 123/57 135/72    14. Neurogenic bladder: Foley removed on 1/14  monitor PVRs d/t potential for urinary retention.  1/19 PVR's low/absent. Occasional incontinence  -timed emptying for now q4 while awake  15. Neurogenic bowel: Continue Senna bid.  -Will give Sorbitol  45 cc x1 this afternoon- if no BM today, will give 60cc tomorrow and SSE. -1/16- had huge BM on toilet- but still feels like could go- will increase Senna to 2 tabs BID since increasing bowel meds- and keep on Sorbitol  prn if needed- d/c'd Bowel program for now since went on toilet- but might need to come back if starts having incontinence.  -01/14/25 LBM 1/15, pt stated no bowel program yesterday. Will restart suppository, continue bowel program, advised pt to use sorbitol  today before dinner as well to encourage better output tonight with program. Could see if she can d/c bowel program again later.  -1/23 had large liquid bm at 1300 yesterday   -will dc senna  -maintain PM bowel program   16. Nausea and reflux--improved  -added protonix  daily--increased to bid 1/22 with improvement  -prn tums, dc'ed maalox       LOS: 8 days A FACE TO FACE EVALUATION WAS PERFORMED  Arthea ONEIDA Gunther 01/20/2025, 10:19 AM     "

## 2025-01-20 NOTE — Progress Notes (Signed)
 Physical Therapy Weekly Progress Note  Patient Details  Name: April Warren MRN: [id number] Date of Birth: [birth year]  Beginning of progress report period: January 14, 2000 End of progress report period: January 20, 2025  Today's Date: 01/20/2025 PT Individual Time: 1300-1345 PT Individual Time Calculation (min): 45 min   Patient has met 3 of 3 short term goals.  Pt has made excellent gains during current course of therapy. Pt currently requires CGA for bed mobility, CGA for Sit to stand transfers, MinA to CGA for gait with RW. During gait pt does demonstrate LE weakness with noted knee instability and/or knee hyperextension, limited endurance, and significant use of UE reliance on RW. Pt continues to work on dynamic standing balance, strength, and endurance. Education has been ongoing throughout therapies.   Patient continues to demonstrate the following deficits muscle weakness, muscle joint tightness, and muscle paralysis and impaired timing and sequencing, unbalanced muscle activation, and decreased coordination and therefore will continue to benefit from skilled PT intervention to increase functional independence with mobility.  Patient progressing toward long term goals..  Continue plan of care.  PT Short Term Goals Week 1:  PT Short Term Goal 1 (Week 1): pt will stand >1 min without knee buckling PT Short Term Goal 1 - Progress (Week 1): Met PT Short Term Goal 2 (Week 1): pt will ambulate 20 ft PT Short Term Goal 2 - Progress (Week 1): Met PT Short Term Goal 3 (Week 1): Pt will perform bed mobility with min a PT Short Term Goal 3 - Progress (Week 1): Met Week 2:  PT Short Term Goal 1 (Week 2): Pt will perform bed mobility with CGA or better PT Short Term Goal 2 (Week 2): Pt will ambulate 150 ft without w/c follow PT Short Term Goal 3 (Week 2): Pt will initiate stair training for strength and stabiltiy  Skilled Therapeutic Interventions/Progress Updates:  Pt received in recliner and  agreeable to therapy.  No complaint of pain. Pt ambulated x 153 ft with w/c follow. Continued knee hyperextension, but pt with improving awareness. Attempted lateral step ups, but pt in L knee pain after aggressive hyperextension. Transitioned to side stepping with green band around ankles for hip strength, visual feedback from mirror to improve knee stability awareness. Reports improved ability to visualize after using mirror.  Pt returned to room after session, remained in w/c to await assist with toileting, call bell in reach.   Therapy Documentation Precautions:  Precautions Precautions: Fall Recall of Precautions/Restrictions: Intact Precaution/Restrictions Comments: cervical Restrictions Weight Bearing Restrictions Per Provider Order: No General:    Therapy/Group: Individual Therapy  [name] 01/20/2025, 3:35 PM

## 2025-01-20 NOTE — Progress Notes (Signed)
 Physical Therapy Session Note  Patient Details  Name: April Warren MRN: [id number] Date of Birth: December 01, 1971  Today's Date: 01/20/2025 PT Individual Time: 1000-1100 and 1505-1530  PT Individual Time Calculation (min): 60 min and 25 min  Short Term Goals: Week 1:  PT Short Term Goal 1 (Week 1): pt will stand >1 min without knee buckling PT Short Term Goal 2 (Week 1): pt will ambulate 20 ft PT Short Term Goal 3 (Week 1): Pt will perform bed mobility with min a  Skilled Therapeutic Interventions/Progress Updates: Pt presented in recliner agreeable to therapy. Pt states pain in UE is more controlled this am. Completed stand with CGA and provided RW. Pt ambulated ~12ft with w/c follow with CGA. Pt noted improved BOS however continues to show mild knee instability which increases with fatigue. Pt transported remaining distance to main gym and worked on standing dynamic balance placing R foot on 4in step and performing ball taps. Pt able to complete x 10 however required minA due to increased instability. After seated rest pt performed again with same outcome. Pt indicating feeling more shaky than ususal . Discussed current factors, pt indicated slept well however states BG has been lower than usual. Pt agreeable to have BG checked, 175. Remainder of session participated in supine therex: SLR 2.5lb weight 2 x 8, SAQ 3lb weight 2 x 10, bridges with adductor squeeze 2 x 10. Pt required minA for supine to sit on mat for truncal support. Pt ambulated back to room in same manner as prior and returned to recliner via stand step transfer CGA. Pt left in recliner at end of session with call bell within reach and needs met.   Tx2: Pt presented in recliner agreeable to therapy. Pt states unrated pain in shoulder, no intervention requested. Pt stood and ambulated to day room with RW and CGA. Pt only demonstrating genu recurvatum during gait. Transferred to NuStep and participated in the following activities for LE  strengthening and LE ms recruitment. Participated in x 3 min L3 LE only at L 10 then after brief rest x 10 min L4 pace partern aiming for 60 SPM. Pt was able to average 41 SPM for a total 456 steps. Pt completed ambulatory transfer to w/c and transported back to room. Pt completed stand step transfer in same manner back to recliner. Pt left in recliner at end of session with call bell within reach and needs met.      Therapy Documentation Precautions:  Precautions Precautions: Fall Recall of Precautions/Restrictions: Intact Precaution/Restrictions Comments: cervical Restrictions Weight Bearing Restrictions Per Provider Order: No   Therapy/Group: Individual Therapy  Tannis Burstein 01/20/2025, 7:51 AM

## 2025-01-21 LAB — GLUCOSE, CAPILLARY
Glucose-Capillary: 100 mg/dL — ABNORMAL HIGH (ref 70–99)
Glucose-Capillary: 141 mg/dL — ABNORMAL HIGH (ref 70–99)
Glucose-Capillary: 229 mg/dL — ABNORMAL HIGH (ref 70–99)
Glucose-Capillary: 181 mg/dL — ABNORMAL HIGH (ref 70–99)

## 2025-01-21 NOTE — Progress Notes (Signed)
 Physical Therapy Session Note  Patient Details  Name: April Warren MRN: [id number] Date of Birth: 1971-12-14  Today's Date: 01/21/2025 PT Individual Time: 1300-1415 PT Individual Time Calculation (min): 75 min   Short Term Goals: Week 2:  PT Short Term Goal 1 (Week 2): Pt will perform bed mobility with CGA or better PT Short Term Goal 2 (Week 2): Pt will ambulate 150 ft without w/c follow PT Short Term Goal 3 (Week 2): Pt will initiate stair training for strength and stabiltiy  Skilled Therapeutic Interventions/Progress Updates:    Pt seated in w/c on arrival and agreeable to therapy. Pt reports numbness and tingling in BUE, as well as a sensation of heaviness. PA made aware, believes it to be caused by decadron  taper and should relieve with time. Advised pt to talk to MD/PA on Monday if not resolved.   Pt ambulated ~ 110 ft into day room with CGA and w/c follow. Continued hyperextension, cues for RW proximity. Pt then participated in side stepping practice with BLE knees bent in small squat, difficulty L>R to maintain throughout, but improving strength and awareness. Followed 4 x 6 Sit to stand with 4.4 lb ball for increased LE strength and practice without RW.   Pt ambulated back to room in same manner and remained in w/c, was left with all needs in reach and alarm active.   Therapy Documentation Precautions:  Precautions Precautions: Fall Recall of Precautions/Restrictions: Intact Precaution/Restrictions Comments: cervical Restrictions Weight Bearing Restrictions Per Provider Order: No General:        Therapy/Group: Individual Therapy  [name] 01/21/2025, 1:29 PM

## 2025-01-21 NOTE — Progress Notes (Signed)
 Occupational Therapy Session Note  Patient Details  Name: April Warren MRN: [id number] Date of Birth: 08/13/1971  Today's Date: 01/21/2025 OT Individual Time: 8481-8395 OT Individual Time Calculation (min): 46 min    Short Term Goals: Week 2:  OT Short Term Goal 1 (Week 2): Patient to increase AROM into R shoulder flexion to 150 degrees OT Short Term Goal 2 (Week 2): patient will complete toileting 3/3 CGA OT Short Term Goal 3 (Week 2): patient will complete LB bathing UB/LB SUP A  Skilled Therapeutic Interventions/Progress Updates:   Patient agreeable to participate in OT session. Reports 4/10 pain level.   Patient participated in skilled OT session focusing on UE mobilization, POC, and LE hyperextension. Patient received in room ready for therapy. Patient completed wc mobility to gym total A for time management. Patient and OT discussed POC to update goals and discuss progress and patient goals. Patient then completed 3x6 mini squats utilizing stable bar to increase quad strengthening for LE control to limit hyperextension in LLE to increase stability for standing ADLS/ transfers. Patient then completed pendulums in standing to increase RUE ROM. OT provided manual manipulation of RUE to increase ROM for functional activities and decrease pain in arm/ shoulder. Patient reported decreased pain following. OT returned patient to room all needs in reach.     Therapy Documentation Precautions:  Precautions Precautions: Fall Recall of Precautions/Restrictions: Intact Precaution/Restrictions Comments: cervical Restrictions Weight Bearing Restrictions Per Provider Order: No  Therapy/Group: Individual Therapy  D'mariea L Alejo Beamer 01/21/2025, 4:24 PM

## 2025-01-21 NOTE — Progress Notes (Shared)
 IP Rehab Bowel Program Documentation   Bowel Program Start time 1810   Dig Stim Indicated? Yes  Dig Stim Prior to Suppository or mini Enema X 1   Output from dig stim: none   Ordered intervention: Suppository Yes ,  Repeat dig stim after Suppository or Mini enema  X {Numbers; 1-5:17750},  Output? {Desc; minimal/small/moderate/large/very large:110034}   Bowel Program Complete? No , handoff given yes   Patient Tolerated? {YES/NO:21197}

## 2025-01-21 NOTE — Progress Notes (Signed)
 Occupational Therapy Weekly Progress Note  Patient Details  Name: Markeda Narvaez MRN: [id number] Date of Birth: July 11, 1971  Beginning of progress report period: January 13, 2025 End of progress report period: January 21, 2025   Patient has met 4 of 4 short term goals.  Patient making functional progress per patient and PT increasing to an overall CG to min A level for functional activities involving both LE and UE. Patient has not yet completed formal family education. Will schedule as patient family schedule allows   Patient continues to demonstrate the following deficits: muscle weakness, decreased cardiorespiratoy endurance, unbalanced muscle activation and decreased coordination, and decreased standing balance and decreased balance strategies and therefore will continue to benefit from skilled OT intervention to enhance overall performance with BADL and Reduce care partner burden.  Patient progressing toward long term goals..  Continue plan of care.  OT Short Term Goals Week 2:  OT Short Term Goal 1 (Week 2): Patient to increase AROM into R shoulder flexion to 150 degrees OT Short Term Goal 2 (Week 2): patient will complete toileting 3/3 CGA OT Short Term Goal 3 (Week 2): patient will complete LB bathing UB/LB SUP A  D'mariea L Daniah Zaldivar 01/21/2025, 7:46 AM

## 2025-01-21 NOTE — Progress Notes (Signed)
 "                                                        PROGRESS NOTE   Subjective/Complaints:  Pt doing well, slept well, pain overall managed, LBM last night was a little soft but just the once so she'll let us  know if the BMs continue to be loose so we can adjust meds-- hesitant to adjust now because she was previously constipated.  Urinating fine. No other complaints or concerns.   ROS: as per HPI. Denies CP, SOB, abd pain, N/V/D/C, or any other complaints at this time.    Objective:   No results found.   Recent Labs    01/19/25 0602  WBC 8.0  HGB 11.9*  HCT 35.5*  PLT 243   Recent Labs    01/19/25 0602  NA 136  K 4.7  CL 101  CO2 25  GLUCOSE 158*  BUN 35*  CREATININE 0.86  CALCIUM  9.4    Intake/Output Summary (Last 24 hours) at 01/21/2025 1233 Last data filed at 01/21/2025 0100 Gross per 24 hour  Intake 272 ml  Output 0 ml  Net 272 ml        Physical Exam: Vital Signs Blood pressure 132/65, pulse 70, temperature 97.8 F (36.6 C), temperature source Oral, resp. rate 18, height 5' 9 (1.753 m), weight 115.2 kg, last menstrual period 10/28/2014, SpO2 100%.    Constitutional: No distress . Vital signs reviewed. Resting comfortably in bed.  HEENT: NCAT, EOMI, oral membranes moist Neck: supple, posterior neck incision with dry dressing and scant amount of serous drainage noted to shirt but not dressing Cardiovascular: RRR without murmur. No JVD    Respiratory/Chest: CTA Bilaterally without wheezes or rales. Normal effort    GI/Abdomen: BS +, non-tender, non-distended, soft Ext: no clubbing, cyanosis, or edema Psych: pleasant and cooperative  Neurological: Ox3 Skin: posterior neck incision with surgical dressing c/d/I as above  PRIOR EXAMS:  Musculoskeletal:  Right shoulder still limited but improving IR/ER. 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 B/L Brisk Hoffman's B/L in Ue's DTR's reduced to 2+ in Ue's and LE's. Stable appearance 1/23  Assessment/Plan: 1. Functional deficits which require 3+ hours per day of interdisciplinary therapy in a comprehensive inpatient rehab setting. Physiatrist is providing close team supervision and 24 hour management of active medical problems listed below. Physiatrist and rehab team continue to assess barriers to discharge/monitor patient progress toward functional and medical goals  Care Tool:  Bathing    Body parts bathed by patient: Right arm, Left arm, Chest, Right upper leg, Left upper leg, Face   Body parts bathed by helper: Abdomen, Front perineal area, Buttocks, Right lower leg, Left lower leg     Bathing assist Assist Level: Maximal Assistance - Patient 24 - 49%     Upper Body Dressing/Undressing Upper body  dressing   What is the patient wearing?: Hospital gown only    Upper body assist Assist Level: Minimal Assistance - Patient > 75%    Lower Body Dressing/Undressing Lower body dressing      What is the patient wearing?: Pants, Incontinence brief     Lower body assist Assist for lower body dressing: Contact Guard/Touching assist     Toileting Toileting    Toileting assist Assist for toileting: Supervision/Verbal cueing     Transfers Chair/bed transfer  Transfers assist     Chair/bed transfer assist level: 2 Helpers     Locomotion Ambulation   Ambulation assist   Ambulation activity did not occur: Safety/medical concerns  Assist level: 2 helpers Assistive device: Walker-rolling Max distance: 11.5 ft   Walk 10 feet activity   Assist  Walk 10 feet  activity did not occur: Safety/medical concerns  Assist level: 2 helpers Assistive device: Walker-rolling   Walk 50 feet activity   Assist Walk 50 feet with 2 turns activity did not occur: Safety/medical concerns         Walk 150 feet activity   Assist Walk 150 feet activity did not occur: Safety/medical concerns         Walk 10 feet on uneven surface  activity   Assist Walk 10 feet on uneven surfaces activity did not occur: Safety/medical concerns         Wheelchair     Assist Is the patient using a wheelchair?: Yes Type of Wheelchair: Manual    Wheelchair assist level: Dependent - Patient 0% Max wheelchair distance: 5 ft    Wheelchair 50 feet with 2 turns activity    Assist        Assist Level: Dependent - Patient 0%   Wheelchair 150 feet activity     Assist      Assist Level: Dependent - Patient 0%   Blood pressure 132/65, pulse 70, temperature 97.8 F (36.6 C), temperature source Oral, resp. rate 18, height 5' 9 (1.753 m), weight 115.2 kg, last menstrual period 10/28/2014, SpO2 100%.  Medical Problem List and Plan: 1. 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 Supervision to min A with RW          -Continue CIR therapies including PT, OT    2.  Antithrombotics: -DVT/anticoagulation:  Mechanical:  Antiembolism stockings, knee (TED hose) Bilateral lower extremities-- 01/14/25 DVT study neg -antiplatelet therapy:              -1/23 begin daily lovenox  40mg  daily   3. Pain Management: Baclofen  5 mg BID. Oxycodone  and robaxin  PRN.  -1/16- will increase Oxy to 10-15 mg q3 hours prn- since pain only went to 8/10 with pain meds- usually 9 or higher when meds wear off. -improved 1/17 -1/20 pt appears to have adhesive capsulitis of the right shoulder with referred pain. I suspect when she was working with therapy yesterday  that she met resistance and pushed past it where she felt grinding or popping as well as pain. We discussed the importance of improving her ROM and at least maintaining it.   1/23 lidocaine  patch helpful for scapular pain   -pt continues to work on right shoulder ROM   4. Mood/Behavior/Sleep: LCSW to follow for evaluation and support when available.              -  antipsychotic agents: Ativan  0.5 mg bid prn   5. Neuropsych/cognition: This patient is capable of making decisions on her own behalf.  6. Skin/Wound Care: Routine pressure relief measures. Monitor incision sites for signs of infection. Hemo Vac removed 1/15.  1/23 --postop dressing soiled, loosening. New dry dressing place. Small open area at the superior aspect of the wound  7. Fluids/Electrolytes/Nutrition: Monitor I&O and weight. Follow up labs CBC/CMP                -Carb modified diet with supplements to continue.   8. Cervical spinal stenosis: s/p laminectomy by Dr. Joshua 1/12. NSGRY following, continue on decadron  taper.   9. DM2: A1c 7.1. Holding home metformin , glipizide  and januvia .   -Hyperglycemia in the setting of steroids.  SSI. Novolog , Lantus  30 units daily. Novolog  7U with meals scheduled as well.  1/19 CBGs still pretty high although better this am.   increased Lantus  to 35U daily as decadron  tapers down.  1/23-decadron  1mg  every day for 3 days then off -sugars still quite high. Will bump lantus  to 38u daily. -continue same novolog  7u with meals  -01/21/25 CBGs doing well, cont regimen  CBG (last 3)  Recent Labs    01/20/25 2025 01/21/25 0559 01/21/25 1207  GLUCAP 187* 100* 141*    10.CKD IIIa: Stable, baseline range 1.1-1.5.  .   1/16- Cr 0.8 and BUN 30- is dry- will push fluids- and recheck Monday 1/19 BUN up to 38---push fluids, she should be able to take in plenty on her own.  1/22 labs reviewed and consistent--f/u Monday   11. Acute bronchitis: CT from 1/1 without acute intrathoracic pathology.  Completed 10 day course of doxycycline  1/15.               -encourage ins spiro.   12. HLD: Lipitor  80 mg   13. HTN: Holding metoprolol , lisinopril , triamterene -HCTZ. Monitor bp per protocol for need to resume.    -1/22-24 bp controlled  Vitals:   01/17/25 1934 01/18/25 0304 01/18/25 0817 01/18/25 1247  BP: (!) 132/54 (!) 147/67 (!) 147/67 119/73   01/18/25 2013 01/19/25 0439 01/19/25 1240 01/19/25 1956  BP: (!) 117/56 133/71 120/62 (!) 123/57   01/20/25 0638 01/20/25 1412 01/20/25 1941 01/21/25 0600  BP: 135/72 106/60 (!) 119/59 132/65    14. Neurogenic bladder: Foley removed on 1/14  monitor PVRs d/t potential for urinary retention.  1/19 PVR's low/absent. Occasional incontinence  -timed emptying for now q4 while awake  15. Neurogenic bowel: Continue Senna bid.  -Will give Sorbitol  45 cc x1 this afternoon- if no BM today, will give 60cc tomorrow and SSE. -1/16- had huge BM on toilet- but still feels like could go- will increase Senna to 2 tabs BID since increasing bowel meds- and keep on Sorbitol  prn if needed- d/c'd Bowel program for now since went on toilet- but might need to come back if starts having incontinence.  -01/14/25 LBM 1/15, pt stated no bowel program yesterday. Will restart suppository, continue bowel program, advised pt to use sorbitol  today before dinner as well to encourage better output tonight with program. Could see if she can d/c bowel program again later.-- 1/18 good output with bowel program so leave in place -1/23 had large liquid bm at 1300 yesterday   -will dc senna  -maintain PM bowel program  -01/21/25 liquid BM last night without bowel program-- previously constipated so hesitant to adjust meds, so pt will tell nursing staff to call us  if persistent loose  stools. Leave meds as is for now.   16. Nausea and reflux--improved  -added protonix  daily--increased to 40mg  bid 1/22 with improvement  -prn tums, dc'ed maalox       LOS: 9 days A FACE TO FACE  EVALUATION WAS PERFORMED  [address] 01/21/2025, 12:33 PM     "

## 2025-01-22 LAB — GLUCOSE, CAPILLARY
Glucose-Capillary: 90 mg/dL (ref 70–99)
Glucose-Capillary: 131 mg/dL — ABNORMAL HIGH (ref 70–99)
Glucose-Capillary: 164 mg/dL — ABNORMAL HIGH (ref 70–99)
Glucose-Capillary: 155 mg/dL — ABNORMAL HIGH (ref 70–99)

## 2025-01-22 NOTE — Progress Notes (Shared)
 IP Rehab Bowel Program Documentation   Bowel Program Start time 1750  Dig Stim Indicated? Yes  Dig Stim Prior to Suppository or mini Enema X {Numbers; 1-5:17750}   Output from dig stim: {Desc; minimal/small/moderate/large/very large:110034}  Ordered intervention: Suppository {YES/NO:21197}, mini enema {YES/NO:21197},   Repeat dig stim after Suppository or Mini enema  X {Numbers; 1-5:17750},  Output? {Desc; minimal/small/moderate/large/very large:110034}   Bowel Program Complete? {YES/NO:21197}, handoff given ***  Patient Tolerated? {YES/NO:21197}

## 2025-01-23 ENCOUNTER — Telehealth: Payer: Self-pay | Admitting: Family Medicine

## 2025-01-23 ENCOUNTER — Inpatient Hospital Stay (HOSPITAL_COMMUNITY)

## 2025-01-23 LAB — GLUCOSE, CAPILLARY
Glucose-Capillary: 107 mg/dL — ABNORMAL HIGH (ref 70–99)
Glucose-Capillary: 93 mg/dL (ref 70–99)
Glucose-Capillary: 156 mg/dL — ABNORMAL HIGH (ref 70–99)
Glucose-Capillary: 368 mg/dL — ABNORMAL HIGH (ref 70–99)
Glucose-Capillary: 146 mg/dL — ABNORMAL HIGH (ref 70–99)

## 2025-01-23 LAB — CBC
HCT: 32 % — ABNORMAL LOW (ref 36.0–46.0)
Hemoglobin: 10.6 g/dL — ABNORMAL LOW (ref 12.0–15.0)
MCH: 30.7 pg (ref 26.0–34.0)
MCHC: 33.1 g/dL (ref 30.0–36.0)
MCV: 92.8 fL (ref 80.0–100.0)
Platelets: 175 10*3/uL (ref 150–400)
RBC: 3.45 MIL/uL — ABNORMAL LOW (ref 3.87–5.11)
RDW: 12.2 % (ref 11.5–15.5)
WBC: 6.4 10*3/uL (ref 4.0–10.5)
nRBC: 0 % (ref 0.0–0.2)

## 2025-01-23 LAB — BASIC METABOLIC PANEL WITH GFR
Anion gap: 8 (ref 5–15)
BUN: 21 mg/dL — ABNORMAL HIGH (ref 6–20)
CO2: 26 mmol/L (ref 22–32)
Calcium: 8.6 mg/dL — ABNORMAL LOW (ref 8.9–10.3)
Chloride: 101 mmol/L (ref 98–111)
Creatinine, Ser: 0.98 mg/dL (ref 0.44–1.00)
GFR, Estimated: >60 mL/min
Glucose, Bld: 107 mg/dL — ABNORMAL HIGH (ref 70–99)
Potassium: 4.5 mmol/L (ref 3.5–5.1)
Sodium: 134 mmol/L — ABNORMAL LOW (ref 135–145)

## 2025-01-23 MED ORDER — BISACODYL 10 MG RE SUPP
10.0000 mg | Freq: Every day | RECTAL | Status: DC
  Administered 2025-01-23 – 2025-01-29 (×5): 10 mg via RECTAL
  Filled 2025-01-23 (×8): qty 1

## 2025-01-23 MED ORDER — SORBITOL 70 % SOLN
60.0000 mL | Freq: Once | Status: AC
  Administered 2025-01-23: 60 mL via ORAL
  Filled 2025-01-23: qty 60

## 2025-01-23 MED ORDER — POLYETHYLENE GLYCOL 3350 17 G PO PACK
17.0000 g | PACK | Freq: Every day | ORAL | Status: DC
  Administered 2025-01-23 – 2025-02-02 (×9): 17 g via ORAL
  Filled 2025-01-23 (×11): qty 1

## 2025-01-23 MED ORDER — BISACODYL 10 MG RE SUPP: 10.0000 mg | Freq: Every day | RECTAL | Status: DC | PRN

## 2025-01-23 MED ORDER — ALUM & MAG HYDROXIDE-SIMETH 200-200-20 MG/5ML PO SUSP: 15.0000 mL | Freq: Four times a day (QID) | ORAL | Status: AC | PRN

## 2025-01-23 NOTE — Progress Notes (Signed)
 Occupational Therapy Session Note  Patient Details  Name: April Warren MRN: [id number] Date of Birth: Dec 30, 1970  Today's Date: 01/23/2025 OT Individual Time: 9184-9154 OT Individual Time Calculation (min): 30 min  and Today's Date: 01/23/2025 OT Missed Time: 30 Minutes Missed Time Reason: Other (comment) (pt c/o n/v)   Short Term Goals: Week 2:  OT Short Term Goal 1 (Week 2): Patient to increase AROM into R shoulder flexion to 150 degrees OT Short Term Goal 2 (Week 2): patient will complete toileting 3/3 CGA OT Short Term Goal 3 (Week 2): patient will complete LB bathing UB/LB SUP A  Skilled Therapeutic Interventions/Progress Updates:    Pt in bed upon arrival with RN attending to pt. Pt reports feeling nauseous this morning. See below for BP. Pt frustrated that she doesn't feel well this morning. Reviewed LTG and pt's progress. Continued ongoing education and discharge planning. Reviewed pt's notebook and answered any questions. Pt missed 30 mins skilled OT services 2/2 n/v. Will attempt to see pt again as schedules allow.  Therapy Documentation Precautions:  Precautions Precautions: Fall Recall of Precautions/Restrictions: Intact Precaution/Restrictions Comments: cervical Restrictions Weight Bearing Restrictions Per Provider Order: No General: General OT Amount of Missed Time: 30 Minutes Vital Signs: Therapy Vitals BP: 128/73 Pain: Pt reports Rt shoulder is better but feeling nauseous this morning; rest in bed   Therapy/Group: Individual Therapy  [name] 01/23/2025, 9:03 AM

## 2025-01-23 NOTE — Progress Notes (Signed)
 Physical Therapy Note  Patient Details  Name: April Warren MRN: [id number] Date of Birth: [birth year] Today's Date: 01/23/2025    Due to the [place]  state of emergency, patients may not receive 3.0 hours of therapy services.    [name] 01/23/2025, 11:02 AM

## 2025-01-23 NOTE — Telephone Encounter (Signed)
 Copied from CRM [id number]. Topic: Appointments - Scheduling Inquiry for Clinic >> Jan 23, 2025 10:07 AM Terri G wrote: Reason for CRM: Patient would like to do a TOC to Dr.Copland since Dr.Blyth is leaving but she's not accepting any new patients and patient saw her on 1/5 and she really liked her so she wanted to know if she could take her on as a patient. Patient is currently in the hospital regarding a spinal cord injury.

## 2025-01-23 NOTE — Progress Notes (Signed)
 "                                                        PROGRESS NOTE   Subjective/Complaints:  Pt reports feeling really nauseated and sick feeling- ate supper  but not breakfast- didn't like what received.  Got pt to eat banana- her BG was 93 and got all DM meds this AM, so need to get her to eat!- Asked nursing tog et her some peanut crackers and ginger ale per her request.   Pain is good even with PT and OT.   Having loose stools- 3x/yesterday- not on any bowel meds scheduled except suppository at 6pm- received at 1750 pm last night- per chart, had 3 large type 6 BM's- 1245 pm; 1750 probably with suppository and 9pm- all continent  ROS: per HPI  Pt denies SOB, some (+) abd pain, CP, N/V/C/ (+) some D, and vision changes   Objective:   No results found.   Recent Labs    01/23/25 0450  WBC 6.4  HGB 10.6*  HCT 32.0*  PLT 175   Recent Labs    01/23/25 0450  NA 134*  K 4.5  CL 101  CO2 26  GLUCOSE 107*  BUN 21*  CREATININE 0.98  CALCIUM  8.6*    Intake/Output Summary (Last 24 hours) at 01/23/2025 0935 Last data filed at 01/23/2025 0400 Gross per 24 hour  Intake 950 ml  Output --  Net 950 ml        Physical Exam: Vital Signs Blood pressure 128/73, pulse 72, temperature 98.9 F (37.2 C), temperature source Oral, resp. rate 17, height 5' 9 (1.753 m), weight 115.2 kg, last menstrual period 10/28/2014, SpO2 96%.     General: awake, alert, appropriate, sitting up in bed; appears ill- like nauseated; NAD HENT: conjugate gaze; oropharynx dry- no breakfast in room CV: regular rate and rhythm; no JVD Pulmonary: CTA B/L; no W/R/R- good air movement GI: soft, mild TTP- no rebound; questionable distension vs protuberant;  normoactive BS Psychiatric: appropriate- but less interactive due to feeling bad Neurological: Ox3  Skin: posterior neck incision with surgical dressing c/d/I as above   Of note, got pt to eat banana   PRIOR EXAMS:  Musculoskeletal:   Right shoulder still limited but improving IR/ER. 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 B/L Brisk Hoffman's B/L in Ue's DTR's reduced to 2+ in Ue's and LE's. Stable appearance 1/23  Assessment/Plan: 1. Functional deficits which require 3+ hours per day of interdisciplinary therapy in a comprehensive inpatient rehab setting. Physiatrist is providing close team supervision and 24 hour management of active medical problems listed below. Physiatrist and rehab team continue to assess barriers to discharge/monitor patient progress toward functional and medical goals  Care Tool:  Bathing    Body parts bathed by patient: Right arm, Left arm, Chest, Right upper  leg, Left upper leg, Face   Body parts bathed by helper: Abdomen, Front perineal area, Buttocks, Right lower leg, Left lower leg     Bathing assist Assist Level: Maximal Assistance - Patient 24 - 49%     Upper Body Dressing/Undressing Upper body dressing   What is the patient wearing?: Hospital gown only    Upper body assist Assist Level: Minimal Assistance - Patient > 75%    Lower Body Dressing/Undressing Lower body dressing      What is the patient wearing?: Pants, Incontinence brief     Lower body assist Assist for lower body dressing: Contact Guard/Touching assist     Toileting Toileting    Toileting assist Assist for toileting: Supervision/Verbal cueing     Transfers Chair/bed transfer  Transfers assist     Chair/bed transfer assist level: 2 Helpers     Locomotion Ambulation   Ambulation assist    Ambulation activity did not occur: Safety/medical concerns  Assist level: 2 helpers Assistive device: Walker-rolling Max distance: 11.5 ft   Walk 10 feet activity   Assist  Walk 10 feet activity did not occur: Safety/medical concerns  Assist level: 2 helpers Assistive device: Walker-rolling   Walk 50 feet activity   Assist Walk 50 feet with 2 turns activity did not occur: Safety/medical concerns         Walk 150 feet activity   Assist Walk 150 feet activity did not occur: Safety/medical concerns         Walk 10 feet on uneven surface  activity   Assist Walk 10 feet on uneven surfaces activity did not occur: Safety/medical concerns         Wheelchair     Assist Is the patient using a wheelchair?: Yes Type of Wheelchair: Manual    Wheelchair assist level: Dependent - Patient 0% Max wheelchair distance: 5 ft    Wheelchair 50 feet with 2 turns activity    Assist        Assist Level: Dependent - Patient 0%   Wheelchair 150 feet activity     Assist      Assist Level: Dependent - Patient 0%   Blood pressure 128/73, pulse 72, temperature 98.9 F (37.2 C), temperature source Oral, resp. rate 17, height 5' 9 (1.753 m), weight 115.2 kg, last menstrual period 10/28/2014, SpO2 96%.  Medical Problem List and Plan: 1. 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 Supervision to min A with RW      Con't CIR- PT and OT 2.  Antithrombotics: -DVT/anticoagulation:  Mechanical:  Antiembolism stockings, knee (TED hose) Bilateral lower extremities-- 01/14/25 DVT study neg -antiplatelet therapy:             -1/23 begin daily lovenox  40mg  daily   3. Pain Management: Baclofen  5 mg BID. Oxycodone  and robaxin  PRN.  -1/16- will increase Oxy to 10-15 mg q3 hours prn- since pain only went to 8/10 with pain meds- usually 9 or higher when meds  wear off. -improved 1/17 -1/20 pt appears to have adhesive capsulitis of the right shoulder with referred pain. I suspect when she was working with therapy yesterday that she met resistance and pushed past it where she felt grinding or popping as well as pain. We discussed the importance of improving her ROM and at least maintaining it.   1/23 lidocaine  patch  helpful for scapular pain   -pt continues to work on right shoulder ROM   1/26- patient pain is controlled- not bad, even with therapy.  4. Mood/Behavior/Sleep: LCSW to follow for evaluation and support when available.              -antipsychotic agents: Ativan  0.5 mg bid prn   5. Neuropsych/cognition: This patient is capable of making decisions on her own behalf.  6. Skin/Wound Care: Routine pressure relief measures. Monitor incision sites for signs of infection. Hemo Vac removed 1/15.  1/23 --postop dressing soiled, loosening. New dry dressing place. Small open area at the superior aspect of the wound  7. Fluids/Electrolytes/Nutrition: Monitor I&O and weight. Follow up labs CBC/CMP                -Carb modified diet with supplements to continue.   8. Cervical spinal stenosis: s/p laminectomy by Dr. Joshua 1/12. NSGRY following, continue on decadron  taper.   9. DM2: A1c 7.1. Holding home metformin , glipizide  and januvia .   -Hyperglycemia in the setting of steroids.  SSI. Novolog , Lantus  30 units daily. Novolog  7U with meals scheduled as well.  1/19 CBGs still pretty high although better this am.   increased Lantus  to 35U daily as decadron  tapers down.  1/23-decadron  1mg  every day for 3 days then off -sugars still quite high. Will bump lantus  to 38u daily. -continue same novolog  7u with meals  -01/21/25 CBGs doing well, cont regimen  1/26- BG's running 93-155- will monitor closely today since feeling ill/nauseated- don't want her dropping too low CBG (last 3)  Recent Labs    01/22/25 2047 01/23/25 0530 01/23/25 0817  GLUCAP 155*  107* 93    10.CKD IIIa: Stable, baseline range 1.1-1.5.  .   1/16- Cr 0.8 and BUN 30- is dry- will push fluids- and recheck Monday 1/19 BUN up to 38---push fluids, she should be able to take in plenty on her own.  1/22 labs reviewed and consistent--f/u Monday   1/26 Cr 0.98 and BUN 21- slightly dry- will ask pt to push fluids  11. Acute bronchitis: CT from 1/1 without acute intrathoracic pathology. Completed 10 day course of doxycycline  1/15.               -encourage ins spiro.   12. HLD: Lipitor  80 mg   13. HTN: Holding metoprolol , lisinopril , triamterene -HCTZ. Monitor bp per protocol for need to resume.    -1/22-24 bp controlled 1/26- Pt's BP slightly soft, will monitor esp in setting of being slightly dry Vitals:   01/19/25 1956 01/20/25 0638 01/20/25 1412 01/20/25 1941  BP: (!) 123/57 135/72 106/60 (!) 119/59   01/21/25 0600 01/21/25 1503 01/21/25 1954 01/22/25 0621  BP: 132/65 106/79 (!) 142/56 109/76   01/22/25 1445 01/22/25 2010 01/23/25 0434 01/23/25 0819  BP: 124/67 (!) 108/52 (!) 110/53 128/73    14. Neurogenic bladder: Foley removed on 1/14  monitor PVRs d/t potential for urinary retention.  1/19 PVR's low/absent. Occasional incontinence  -timed emptying for now q4 while awake  15. Neurogenic bowel: Continue Senna bid.  -Will give Sorbitol  45 cc x1 this afternoon- if no BM today, will give 60cc tomorrow and SSE. -1/16- had huge BM on toilet- but still feels like could go- will increase Senna to 2 tabs BID since increasing bowel meds- and keep on Sorbitol  prn if needed- d/c'd Bowel program for now since went on toilet- but might need to come back if starts having incontinence.  -01/14/25 LBM 1/15,  pt stated no bowel program yesterday. Will restart suppository, continue bowel program, advised pt to use sorbitol  today before dinner as well to encourage better output tonight with program. Could see if she can d/c bowel program again later.-- 1/18 good output with bowel program  so leave in place -1/23 had large liquid bm at 1300 yesterday   -will dc senna  -maintain PM bowel program  -01/21/25 liquid BM last night without bowel program-- previously constipated so hesitant to adjust meds, so pt will tell nursing staff to call us  if persistent loose stools. Leave meds as is for now.  1/26- will make suppository prn for now, but checking KUB to  make sure not backed up and only loose stool getting through, vs ileus?-  16. Nausea and reflux--improved  -added protonix  daily--increased to 40mg  bid 1/22 with improvement  -prn tums, dc'ed maalox  1/26- added Maalox back prn just in case.   I spent a total of 51   minutes on total care today- >50% coordination of care- due to  Review of chart since 1 week ago- reviewed labs, vitals and B/B- also made changes to orders and got KUB which is pending. Will read when comes back independently      LOS: 11 days A FACE TO FACE EVALUATION WAS PERFORMED  Britanni Yarde 01/23/2025, 9:35 AM     "

## 2025-01-23 NOTE — Progress Notes (Signed)
 Occupational Therapy Session Note  Patient Details  Name: April Warren MRN: [id number] Date of Birth: 1971-07-08  Today's Date: 01/23/2025 OT Individual Time: 1300-1330 OT Individual Time Calculation (min): 30 min    Short Term Goals: Week 2:  OT Short Term Goal 1 (Week 2): Patient to increase AROM into R shoulder flexion to 150 degrees OT Short Term Goal 2 (Week 2): patient will complete toileting 3/3 CGA OT Short Term Goal 3 (Week 2): patient will complete LB bathing UB/LB SUP A  Skilled Therapeutic Interventions/Progress Updates:      Therapy Documentation Precautions:  Precautions Precautions: Fall Recall of Precautions/Restrictions: Intact Precaution/Restrictions Comments: cervical Restrictions Weight Bearing Restrictions Per Provider Order: No General: Pt seated in W/C upon OT arrival, agreeable to OT. Reports feeling nauseated today, thinks it might be from the bowel meds.   Pain:  5/10 pain reported in Rt shoulder, activity, intermittent rest breaks, distractions provided for pain management, pt reports tolerable to proceed.   Exercises: Pt participated in [facility] activity with small pegs, pt retrieving pegs  with rotating BUEs and placing them onto peg board  for increased dexterity and coordination in order to complete ADLs such as toileting. Pt completed tasks with SBA, dropping a few pegs d/t decreased sensation in fingers.    OT instructing pt on 9HPT in order to complete standardized test for baseline BUE function. Pt scored R-1:00 min  L-0:58 sec which is not WNL of age and sex per pt demographics. Pt will be re-tested near D/C to determine increase of function.    Pt seated on commode at end of session, OT ran out of time to assist. OT verbally spoke with pt nurse to let her know pt's location.    Therapy/Group: Individual Therapy  Camie Hoe, OTD, OTR/L 01/23/2025, 3:59 PM

## 2025-01-23 NOTE — Progress Notes (Shared)
 IP Rehab Bowel Program Documentation   Bowel Program Start time 1800  Dig Stim Indicated? Yes  Dig Stim Prior to Suppository or mini Enema X 2   Output from dig stim:none   Ordered intervention: Suppository Yes ,   Repeat dig stim after Suppository or Mini enema  X {Numbers; 1-5:17750},  Output? {Desc; minimal/small/moderate/large/very large:110034}   Bowel Program Complete? {YES/NO:21197}, handoff given ***  Patient Tolerated? {YES/NO:21197}

## 2025-01-23 NOTE — Plan of Care (Signed)
" °  Problem: RH Balance Goal: LTG Patient will maintain dynamic standing with ADLs (OT) Description: LTG:  Patient will maintain dynamic standing balance with assist during activities of daily living (OT)  Flowsheets (Taken 01/23/2025 1553) LTG: Pt will maintain dynamic standing balance during ADLs with: (goal upgraded d/t progress) Independent with assistive device   Problem: RH Dressing Goal: LTG Patient will perform lower body dressing w/assist (OT) Description: LTG: Patient will perform lower body dressing with assist, with/without cues in positioning using equipment (OT) Flowsheets (Taken 01/23/2025 1553) LTG: Pt will perform lower body dressing with assistance level of: (goal upgraded d/t progress) --   Problem: RH Toileting Goal: LTG Patient will perform toileting task (3/3 steps) with assistance level (OT) Description: LTG: Patient will perform toileting task (3/3 steps) with assistance level (OT)  Flowsheets (Taken 01/23/2025 1553) LTG: Pt will perform toileting task (3/3 steps) with assistance level: (goal upgraded d/t progress) Independent with assistive device   Problem: RH Simple Meal Prep Goal: LTG Patient will perform simple meal prep w/assist (OT) Description: LTG: Patient will perform simple meal prep with assistance, with/without cues (OT). Flowsheets (Taken 01/23/2025 1553) LTG: Pt will perform simple meal prep with assistance level of: (goal upgraded d/t progress) Supervision/Verbal cueing   Problem: RH Toilet Transfers Goal: LTG Patient will perform toilet transfers w/assist (OT) Description: LTG: Patient will perform toilet transfers with assist, with/without cues using equipment (OT) Flowsheets (Taken 01/23/2025 1553) LTG: Pt will perform toilet transfers with assistance level of: (goal upgraded d/t progress) Independent with assistive device   Problem: RH Tub/Shower Transfers Goal: LTG Patient will perform tub/shower transfers w/assist (OT) Description: LTG: Patient  will perform tub/shower transfers with assist, with/without cues using equipment (OT) Flowsheets (Taken 01/23/2025 1553) LTG: Pt will perform tub/shower stall transfers with assistance level of: (goal upgraded d/t progress) Independent with assistive device   "

## 2025-01-23 NOTE — Progress Notes (Signed)
 Physical Therapy Session Note  Patient Details  Name: April Warren MRN: [id number] Date of Birth: 1971-05-21  Today's Date: 01/23/2025 PT Individual Time: 1100-1156 PT Individual Time Calculation (min): 56 min   Short Term Goals: Week 2:  PT Short Term Goal 1 (Week 2): Pt will perform bed mobility with CGA or better PT Short Term Goal 2 (Week 2): Pt will ambulate 150 ft without w/c follow PT Short Term Goal 3 (Week 2): Pt will initiate stair training for strength and stabiltiy  Skilled Therapeutic Interventions/Progress Updates:    Moved session due to pt being off unit for xray. Pt still a little nauseous but reports feeling much better. Transported to gym for time mangement. Functional transfers with RW throughout session with CGA overall for balance - focus on control of L knee in stance.  Focused on NMR to address balance, coordination, strength, and LLE control/coordination: Toe taps x 10 reps x 2 sets to 3.5 step with seated rest break x 2 trials each BLE, decreased hyperextension noted (CGA with tactile feedback to L knee) Compliant surface sit <.> stands without use of RW x 8 reps (CGA) Compliant surface dynamic reaching with mini squat and cornhole with intermittent 1UE support on RW for support while reaching repeated x 2 trials reaching to the R and to the L (CGA to min A) Stair negotiation x 8 steps with bilateral rails with CGA/min A with step to pattern, cues for L knee control to decreased hyperextension, no buckling noted  Gait with RW to focus on functional strengthening, L knee control and overall balance x 130' with overall CGA, narrow BOS especially during turns.  W/c propulsion for BUE strengthening, endurance and coordination x 130' with S.  Set up with all needs in reach back in room.   Therapy Documentation Precautions:  Precautions Precautions: Fall Recall of Precautions/Restrictions: Intact Precaution/Restrictions Comments: cervical Restrictions Weight  Bearing Restrictions Per Provider Order: No  Pain: Reports pain in neck is manageable - premedicated.      Therapy/Group: Individual Therapy  [name] [name] Elnor, PT, DPT, CBIS  01/23/2025, 12:00 PM

## 2025-01-24 ENCOUNTER — Inpatient Hospital Stay (HOSPITAL_COMMUNITY)

## 2025-01-24 LAB — GLUCOSE, CAPILLARY
Glucose-Capillary: 165 mg/dL — ABNORMAL HIGH (ref 70–99)
Glucose-Capillary: 157 mg/dL — ABNORMAL HIGH (ref 70–99)
Glucose-Capillary: 187 mg/dL — ABNORMAL HIGH (ref 70–99)
Glucose-Capillary: 145 mg/dL — ABNORMAL HIGH (ref 70–99)

## 2025-01-24 MED ORDER — IOHEXOL 350 MG/ML SOLN
75.0000 mL | Freq: Once | INTRAVENOUS | Status: AC | PRN
  Administered 2025-01-24: 75 mL via INTRAVENOUS

## 2025-01-24 MED ORDER — PROMETHAZINE HCL 25 MG PO TABS
25.0000 mg | ORAL_TABLET | Freq: Four times a day (QID) | ORAL | Status: AC | PRN
  Administered 2025-01-24 – 2025-01-26 (×2): 25 mg via ORAL
  Filled 2025-01-24 (×3): qty 1

## 2025-01-24 MED ORDER — SORBITOL 70 % SOLN
60.0000 mL | Freq: Once | Status: AC
  Administered 2025-01-24: 60 mL via ORAL
  Filled 2025-01-24: qty 60

## 2025-01-24 MED ORDER — SENNA 8.6 MG PO TABS
2.0000 | ORAL_TABLET | Freq: Every day | ORAL | Status: DC
  Administered 2025-01-24 – 2025-01-31 (×8): 17.2 mg via ORAL
  Filled 2025-01-24 (×8): qty 2

## 2025-01-24 MED ORDER — SMOG ENEMA
960.0000 mL | Freq: Once | RECTAL | Status: AC
  Administered 2025-01-24: 960 mL via RECTAL
  Filled 2025-01-24: qty 960

## 2025-01-24 MED ORDER — SODIUM CHLORIDE 0.9 % IV SOLN: INTRAVENOUS | Status: DC

## 2025-01-24 NOTE — Progress Notes (Signed)
 Occupational Therapy Note  Patient Details  Name: April Warren MRN: [id number] Date of Birth: 04/17/1971  Today's Date: 01/24/2025 OT Missed Time: 75 Minutes Missed Time Reason: X-Ray (pt off unit for imaging)  Pt off unit for imaging. Will attempt to make up minutes as able   Camie Hoe, OTD, OTR/L 01/24/2025, 2:30 PM

## 2025-01-24 NOTE — Progress Notes (Addendum)
 "                                                        PROGRESS NOTE   Subjective/Complaints:  Pt reports feels much worse today- so much nausea- hasn't taken anti nausea meds because didn't think they'd help her feel better- will order phenergan  and advised her to take. No vomiting,  but on liquid diet.   Said had 2 Bms- liquid stools with Sorbitol  yesterday.  Actually per chart, had 1 type 6 and 2 type 7 stools since 1pm yesterday- 2 small, 1 large.   Also feels like abdomen is more bloated.     ROS: per HPI    Pt denies SOB,  (+) abd pain, CP,  (+) N/V/C/D, and vision changes   Objective:   DG Abd 1 View Result Date: 01/23/2025 CLINICAL DATA:  Nausea, abdominal pain. EXAM: ABDOMEN - 1 VIEW COMPARISON:  None Available. FINDINGS: Gaseous distention of small bowel and colon.  Moderate stool burden. IMPRESSION: Probable ileus with moderate stool burden. Difficult to exclude a partial small bowel obstruction. Electronically Signed   By: Newell Eke M.D.   On: 01/23/2025 13:10     Recent Labs    01/23/25 0450  WBC 6.4  HGB 10.6*  HCT 32.0*  PLT 175   Recent Labs    01/23/25 0450  NA 134*  K 4.5  CL 101  CO2 26  GLUCOSE 107*  BUN 21*  CREATININE 0.98  CALCIUM  8.6*    Intake/Output Summary (Last 24 hours) at 01/24/2025 9047 Last data filed at 01/24/2025 0900 Gross per 24 hour  Intake 240 ml  Output --  Net 240 ml        Physical Exam: Vital Signs Blood pressure 118/71, pulse 70, temperature 98.9 F (37.2 C), temperature source Oral, resp. rate 16, height 5' 9 (1.753 m), weight 115.2 kg, last menstrual period 10/28/2014, SpO2 97%.      General: awake, alert, appropriate, supine in bed;  NAD HENT: conjugate gaze; oropharynx dry- liquid breakfast at bedside CV: regular rate and bedside; no JVD Pulmonary: CTA B/L; no W/R/R- good air movement GI: somewhat soft; but somewhat TTP- no rebound; much more distended- hypoactive BS Psychiatric: appropriate-  but appears in some distress Neurological: Ox3   Skin: posterior neck incision with surgical dressing c/d/I as above     PRIOR EXAMS:  Musculoskeletal:  Right shoulder still limited but improving IR/ER. 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 B/L Brisk Hoffman's B/L in Ue's DTR's reduced to 2+ in Ue's and LE's. Stable appearance 1/23  Assessment/Plan: 1. Functional deficits which require 3+ hours per day of interdisciplinary therapy in a comprehensive inpatient rehab setting. Physiatrist is providing close team supervision and 24 hour management of active medical problems listed below. Physiatrist and rehab team continue to assess barriers to discharge/monitor patient  progress toward functional and medical goals  Care Tool:  Bathing    Body parts bathed by patient: Right arm, Left arm, Chest, Right upper leg, Left upper leg, Face   Body parts bathed by helper: Abdomen, Front perineal area, Buttocks, Right lower leg, Left lower leg     Bathing assist Assist Level: Maximal Assistance - Patient 24 - 49%     Upper Body Dressing/Undressing Upper body dressing   What is the patient wearing?: Hospital gown only    Upper body assist Assist Level: Minimal Assistance - Patient > 75%    Lower Body Dressing/Undressing Lower body dressing      What is the patient wearing?: Pants, Incontinence brief     Lower body assist Assist for lower body dressing: Contact Guard/Touching assist     Toileting Toileting    Toileting assist Assist for toileting: Supervision/Verbal cueing      Transfers Chair/bed transfer  Transfers assist     Chair/bed transfer assist level: Contact Guard/Touching assist     Locomotion Ambulation   Ambulation assist   Ambulation activity did not occur: Safety/medical concerns  Assist level: Contact Guard/Touching assist Assistive device: Walker-rolling Max distance: 130'   Walk 10 feet activity   Assist  Walk 10 feet activity did not occur: Safety/medical concerns  Assist level: Contact Guard/Touching assist Assistive device: Walker-rolling   Walk 50 feet activity   Assist Walk 50 feet with 2 turns activity did not occur: Safety/medical concerns  Assist level: Contact Guard/Touching assist Assistive device: Walker-rolling    Walk 150 feet activity   Assist Walk 150 feet activity did not occur: Safety/medical concerns         Walk 10 feet on uneven surface  activity   Assist Walk 10 feet on uneven surfaces activity did not occur: Safety/medical concerns         Wheelchair     Assist Is the patient using a wheelchair?: Yes Type of Wheelchair: Manual    Wheelchair assist level: Supervision/Verbal cueing Max wheelchair distance: 130'    Wheelchair 50 feet with 2 turns activity    Assist        Assist Level: Supervision/Verbal cueing   Wheelchair 150 feet activity     Assist      Assist Level: Dependent - Patient 0%   Blood pressure 118/71, pulse 70, temperature 98.9 F (37.2 C), temperature source Oral, resp. rate 16, height 5' 9 (1.753 m), weight 115.2 kg, last menstrual period 10/28/2014, SpO2 97%.  Medical Problem List and Plan: 1. 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 Supervision to min A with RW      Con't CIR PT and OT  Team conference today to f/u on progress  Let pt know do what she can- but medically, can hold some therapy if need be.  2.   Antithrombotics: -DVT/anticoagulation:  Mechanical:  Antiembolism stockings, knee (TED hose) Bilateral lower extremities-- 01/14/25 DVT study neg -antiplatelet therapy:             -1/23 begin daily lovenox  40mg  daily   3. Pain Management: Baclofen  5 mg BID. Oxycodone  and robaxin  PRN.  -1/16- will increase Oxy to 10-15 mg q3 hours prn- since pain only went to 8/10 with pain meds- usually 9 or higher when meds wear off. -improved 1/17 -1/20 pt appears to have adhesive  capsulitis of the right shoulder with referred pain. I suspect when she was working with therapy yesterday that she met resistance and pushed past it where she felt grinding or popping as well as pain. We discussed the importance of improving her ROM and at least maintaining it.   1/23 lidocaine  patch helpful for scapular pain   -pt continues to work on right shoulder ROM   1/26- patient pain is controlled- not bad, even with therapy.  4. Mood/Behavior/Sleep: LCSW to follow for evaluation and support when available.              -antipsychotic agents: Ativan  0.5 mg bid prn   5. Neuropsych/cognition: This patient is capable of making decisions on her own behalf.  6. Skin/Wound Care: Routine pressure relief measures. Monitor incision sites for signs of infection. Hemo Vac removed 1/15.  1/23 --postop dressing soiled, loosening. New dry dressing place. Small open area at the superior aspect of the wound  7. Fluids/Electrolytes/Nutrition: Monitor I&O and weight. Follow up labs CBC/CMP                -Carb modified diet with supplements to continue.   8. Cervical spinal stenosis: s/p laminectomy by Dr. Joshua 1/12. NSGRY following, continue on decadron  taper.   9. DM2: A1c 7.1. Holding home metformin , glipizide  and januvia .   -Hyperglycemia in the setting of steroids.  SSI. Novolog , Lantus  30 units daily. Novolog  7U with meals scheduled as well.  1/19 CBGs still pretty high although better this am.   increased Lantus  to 35U daily as  decadron  tapers down.  1/23-decadron  1mg  every day for 3 days then off -sugars still quite high. Will bump lantus  to 38u daily. -continue same novolog  7u with meals  -01/21/25 CBGs doing well, cont regimen  1/26- BG's running 93-155- will monitor closely today since feeling ill/nauseated- don't want her dropping too low  1/27- BG's 140s-368- went up- x1- will check/monitor more closely.  CBG (last 3)  Recent Labs    01/23/25 1643 01/23/25 2055 01/24/25 0543  GLUCAP 368* 146* 165*    10.CKD IIIa: Stable, baseline range 1.1-1.5.  .   1/16- Cr 0.8 and BUN 30- is dry- will push fluids- and recheck Monday 1/19 BUN up to 38---push fluids, she should be able to take in plenty on her own.  1/22 labs reviewed and consistent--f/u Monday   1/26 Cr 0.98 and BUN 21- slightly dry- will ask pt to push fluids  1/27- will order CMP tomorrow 11. Acute bronchitis: CT from 1/1 without acute intrathoracic pathology. Completed 10 day course of doxycycline  1/15.               -encourage ins spiro.   12. HLD: Lipitor  80 mg   13. HTN: Holding metoprolol , lisinopril , triamterene -HCTZ. Monitor bp per protocol for need to resume.    -1/22-24 bp controlled 1/26- Pt's BP slightly soft, will monitor esp in setting of being slightly dry Vitals:   01/20/25 1941 01/21/25 0600 01/21/25 1503 01/21/25 1954  BP: (!) 119/59 132/65 106/79 (!) 142/56   01/22/25 0621 01/22/25 1445 01/22/25 2010 01/23/25 0434  BP: 109/76 124/67 (!) 108/52 (!) 110/53   01/23/25 0819 01/23/25 1345 01/23/25 1930 01/24/25 0538  BP: 128/73 117/66 (!) 104/54 118/71    14. Neurogenic bladder: Foley removed on 1/14  monitor PVRs d/t potential for urinary retention.  1/19 PVR's low/absent. Occasional incontinence  -timed emptying for now q4 while awake  15. Neurogenic bowel: Continue Senna bid.  -Will  give Sorbitol  45 cc x1 this afternoon- if no BM today, will give 60cc tomorrow and SSE. -1/16- had huge BM on toilet- but still feels like  could go- will increase Senna to 2 tabs BID since increasing bowel meds- and keep on Sorbitol  prn if needed- d/c'd Bowel program for now since went on toilet- but might need to come back if starts having incontinence.  -01/14/25 LBM 1/15, pt stated no bowel program yesterday. Will restart suppository, continue bowel program, advised pt to use sorbitol  today before dinner as well to encourage better output tonight with program. Could see if she can d/c bowel program again later.-- 1/18 good output with bowel program so leave in place -1/23 had large liquid bm at 1300 yesterday   -will dc senna  -maintain PM bowel program  -01/21/25 liquid BM last night without bowel program-- previously constipated so hesitant to adjust meds, so pt will tell nursing staff to call us  if persistent loose stools. Leave meds as is for now.  1/26- will make suppository prn for now, but checking KUB to  make sure not backed up and only loose stool getting through, vs ileus?-  16. Nausea and reflux--improved  -added protonix  daily--increased to 40mg  bid 1/22 with improvement  -prn tums, dc'ed maalox  1/26- added Maalox back prn just in case.  17. Questionable partial small bowel obstruction  1/27- ordered another KUB because want to make sure not getting worse- concerned,because pt feeling worse- ordered Phenergan - d/w nursing to get done ASAP- and pt on liquid diet now- decided due to Sx's being significantly worse, will place NGT via Fluoro today and hopefully she will feel better.     I spent a total of 59   minutes on total care today- >50% coordination of care- due to  D/w pt x2; with NP; with pt's nurse x4; also  getting KUB and maybe CT scan if not diagnostic; and team conference to f/u on progress medically and functionally     Addendum- spoke to radiology- they said has a LOT of air, but colon not really distended- also moderate to large stool burden.   Will wait on NG tube if not in, and will give her the  sorbitol  again at noon; and then SMOG enema at 6pm, to try and get her really cleaned out.  Also added Senna 2 tabs daily-   LOS: 12 days A FACE TO FACE EVALUATION WAS PERFORMED  Kenora Spayd 01/24/2025, 9:52 AM     "

## 2025-01-24 NOTE — Patient Care Conference (Signed)
 Inpatient RehabilitationTeam Conference and Plan of Care Update Date: 01/24/2025   Time: 1127 am    Patient Name: April Warren      Medical Record Number: [id number]  Date of Birth: [birth year] Sex: Female         Room/Bed: 4W12C/4W12C-02 Payor Info: Payor: TRICARE / Plan: [employer]ARE [place] / Product Type: *No Product type* /    Admit Date/Time:  01/12/2025  1:37 PM  Primary Diagnosis:  Acute incomplete quadriplegia [facility])  Hospital Problems: Principal Problem:   Acute incomplete quadriplegia (HCC) Active Problems:   Spinal cord injury, cervical region [facility])   Adjustment disorder with depressed mood    Expected Discharge Date: Expected Discharge Date: 02/10/25  Team Members Present: Physician leading conference: Dr. Duwaine Barrs Social Worker Present: Graeme Jude, LCSW Nurse Present: Eulalio Falls, RN PT Present: Schuyler Batter, PT OT Present: Camie Hoe, OT     Current Status/Progress Goal Weekly Team Focus  Bowel/Bladder   (P) Continent of b/b, bowel program  ***          Swallow/Nutrition/ Hydration      ***         ADL's   SBA LB/UB ADLs, Min A toileting d/t decreased ROM for managing peri care after BM, [facility] getting better, shoulder pain getting better  *** SBA-mod I, upgraded 1/26   BUE ROM, strengthening, FMC, ADL retraining, dynamic balance    Mobility   CGA with gait up to 150 ft, still with consistent knee hyperextension L>R. initiated stairs with min a  *** supervision overall, gait to 150 ft  LE strength and control    Communication      ***          Safety/Cognition/ Behavioral Observations     ***          Pain      ***          Skin      ***           Discharge Planning:  Pt will d/c to home with her husband. 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  Fam edu on 2/9 9am-11am.SW will confirm there are no barriers to discharge.   Team  Discussion: *** Patient on target to meet rehab goals: {IP REHAB YES/NO WITH TPOIRJMID:75863}  *See Care Plan and progress notes for long and short-term goals.   Revisions to Treatment Plan:  ***  Teaching Needs: ***  Current Barriers to Discharge: {BARRIERS TO IPDRYJMHZ:75864}  Possible Resolutions to Barriers: ***     Medical Summary Current Status: neck incision healing- Partial SBO- severe nausea; continent of bladder- not always of bowel- on bowel program-  Barriers to Discharge: Self-care education;Spasticity;Behavior/Mood;Incontinence;Neurogenic Bowel & Bladder;Morbid Obesity;Medical stability;Weight bearing restrictions;Other (comments);Uncontrolled Pain;Uncontrolled Diabetes  Barriers to Discharge Comments: partial SBO; impaired fine motor movement- R shoulder pain; knee hyperextension; Possible Resolutions to [employer] Focus: upgrade goals to mod I; NGT placement today- pending KUB/maybe CT scan- d/c 2/13   Continued Need for Acute Rehabilitation Level of Care: The patient requires daily medical management by a physician with specialized training in physical medicine and rehabilitation for the following reasons: Direction of a multidisciplinary physical rehabilitation program to maximize functional independence : Yes Medical management of patient stability for increased activity during participation in an intensive rehabilitation regime.: Yes Analysis of laboratory values and/or radiology reports with any subsequent need  for medication adjustment and/or medical intervention. : Yes   I attest that I was present, lead the team conference, and concur with the assessment and plan of the team.   Takeira Yanes Gayo 01/24/2025, 1127 am

## 2025-01-24 NOTE — Progress Notes (Signed)
 Physical Therapy Session Note  Patient Details  Name: Aissa Lisowski MRN: [id number] Date of Birth: 1971/05/28  Today's Date: 01/24/2025 PT Individual Time: 9154-9084 PT Individual Time Calculation (min): 30 min   Short Term Goals: Week 2:  PT Short Term Goal 1 (Week 2): Pt will perform bed mobility with CGA or better PT Short Term Goal 2 (Week 2): Pt will ambulate 150 ft without w/c follow PT Short Term Goal 3 (Week 2): Pt will initiate stair training for strength and stabiltiy  Skilled Therapeutic Interventions/Progress Updates:    Pt recd in bed, reporting extreme discomfort and nausea. Pt agreeable to gentle bed level mobility to assist with motility. Performed lower trunk rotations and supine marching x 3 bouts. Educated on using gentle mobility hourly to assist with bowel discomfort. Pt remained in bed at end of session, Missed x 30 min d/t nausea, will make up as able.   Therapy Documentation Precautions:  Precautions Precautions: Fall Recall of Precautions/Restrictions: Intact Precaution/Restrictions Comments: cervical Restrictions Weight Bearing Restrictions Per Provider Order: No General: PT Amount of Missed Time (min): 30 Minutes PT Missed Treatment Reason: Patient ill (Comment) (nausea) Vital Signs:     Therapy/Group: Individual Therapy  [name] 01/24/2025, 12:58 PM

## 2025-01-24 NOTE — Progress Notes (Signed)
 Occupational Therapy Note  Patient Details  Name: April Warren MRN: [id number] Date of Birth: 06-Jun-1971  Today's Date: 01/24/2025 OT Missed Time: 75 Minutes Missed Time Reason: Other (comment)  Pt resting in bed upon arrival. Pt reports she is not feeling well and has a partial bowel blockage. Pt with probable ileus. Pt reports her legs feel like noodles. Pt unable to actively participate in therapy. Pt missed 75 mins skilled OT services. Will attempt to see pt again as schedules allow.   [facility]it[facility] 01/24/2025, 7:31 AM

## 2025-01-24 NOTE — Progress Notes (Signed)
 Patient ID: April Warren, female   DOB: March 06, 1971, 54 y.o.   MRN: [id number]   1350- SW called pt husband to provide updates from team conference, and d/c date remains 2/13. He is aware of her recent medical issues,and he is worried about her. Recognizes her issues, and is hopeful things will get better. He is aware SW will provide updates as available.   Graeme Jude, MSW, LCSW Office: [phone] Cell: [phone] Fax: [phone]

## 2025-01-24 NOTE — Progress Notes (Signed)
 SMOG enema admin at 1755 by this RN without results. RN able to disimpact small type 6 post enema and admin dulcolax suppository.

## 2025-01-25 ENCOUNTER — Telehealth: Payer: Self-pay | Admitting: Acute Care

## 2025-01-25 DIAGNOSIS — G9529 Other cord compression: Secondary | ICD-10-CM

## 2025-01-25 DIAGNOSIS — M4802 Spinal stenosis, cervical region: Secondary | ICD-10-CM

## 2025-01-25 DIAGNOSIS — E11319 Type 2 diabetes mellitus with unspecified diabetic retinopathy without macular edema: Secondary | ICD-10-CM

## 2025-01-25 DIAGNOSIS — E785 Hyperlipidemia, unspecified: Secondary | ICD-10-CM

## 2025-01-25 DIAGNOSIS — K5900 Constipation, unspecified: Secondary | ICD-10-CM

## 2025-01-25 DIAGNOSIS — Z981 Arthrodesis status: Secondary | ICD-10-CM

## 2025-01-25 DIAGNOSIS — R918 Other nonspecific abnormal finding of lung field: Secondary | ICD-10-CM

## 2025-01-25 DIAGNOSIS — E669 Obesity, unspecified: Secondary | ICD-10-CM

## 2025-01-25 DIAGNOSIS — I129 Hypertensive chronic kidney disease with stage 1 through stage 4 chronic kidney disease, or unspecified chronic kidney disease: Secondary | ICD-10-CM

## 2025-01-25 DIAGNOSIS — E1122 Type 2 diabetes mellitus with diabetic chronic kidney disease: Secondary | ICD-10-CM

## 2025-01-25 LAB — CBC WITH DIFFERENTIAL/PLATELET
Abs Immature Granulocytes: 0.02 10*3/uL (ref 0.00–0.07)
Basophils Absolute: 0 10*3/uL (ref 0.0–0.1)
Basophils Relative: 0 %
Eosinophils Absolute: 0.3 10*3/uL (ref 0.0–0.5)
Eosinophils Relative: 3 %
HCT: 31.5 % — ABNORMAL LOW (ref 36.0–46.0)
Hemoglobin: 10.5 g/dL — ABNORMAL LOW (ref 12.0–15.0)
Immature Granulocytes: 0 %
Lymphocytes Relative: 17 %
Lymphs Abs: 1.4 10*3/uL (ref 0.7–4.0)
MCH: 30.6 pg (ref 26.0–34.0)
MCHC: 33.3 g/dL (ref 30.0–36.0)
MCV: 91.8 fL (ref 80.0–100.0)
Monocytes Absolute: 0.6 10*3/uL (ref 0.1–1.0)
Monocytes Relative: 7 %
Neutro Abs: 6 10*3/uL (ref 1.7–7.7)
Neutrophils Relative %: 73 %
Platelets: 177 10*3/uL (ref 150–400)
RBC: 3.43 MIL/uL — ABNORMAL LOW (ref 3.87–5.11)
RDW: 12.6 % (ref 11.5–15.5)
WBC: 8.3 10*3/uL (ref 4.0–10.5)
nRBC: 0 % (ref 0.0–0.2)

## 2025-01-25 LAB — COMPREHENSIVE METABOLIC PANEL WITH GFR
ALT: 23 U/L (ref 0–44)
AST: 22 U/L (ref 15–41)
Albumin: 3 g/dL — ABNORMAL LOW (ref 3.5–5.0)
Alkaline Phosphatase: 89 U/L (ref 38–126)
Anion gap: 9 (ref 5–15)
BUN: 19 mg/dL (ref 6–20)
CO2: 26 mmol/L (ref 22–32)
Calcium: 8.4 mg/dL — ABNORMAL LOW (ref 8.9–10.3)
Chloride: 101 mmol/L (ref 98–111)
Creatinine, Ser: 0.87 mg/dL (ref 0.44–1.00)
GFR, Estimated: >60 mL/min
Glucose, Bld: 147 mg/dL — ABNORMAL HIGH (ref 70–99)
Potassium: 4.2 mmol/L (ref 3.5–5.1)
Sodium: 135 mmol/L (ref 135–145)
Total Bilirubin: 1.4 mg/dL — ABNORMAL HIGH (ref 0.0–1.2)
Total Protein: 5.9 g/dL — ABNORMAL LOW (ref 6.5–8.1)

## 2025-01-25 LAB — GLUCOSE, CAPILLARY
Glucose-Capillary: 168 mg/dL — ABNORMAL HIGH (ref 70–99)
Glucose-Capillary: 268 mg/dL — ABNORMAL HIGH (ref 70–99)
Glucose-Capillary: 114 mg/dL — ABNORMAL HIGH (ref 70–99)
Glucose-Capillary: 80 mg/dL (ref 70–99)

## 2025-01-25 MED ORDER — PROCHLORPERAZINE EDISYLATE 10 MG/2ML IJ SOLN
5.0000 mg | Freq: Four times a day (QID) | INTRAMUSCULAR | Status: AC | PRN
  Administered 2025-01-25 (×2): 10 mg via INTRAVENOUS
  Filled 2025-01-25 (×3): qty 2

## 2025-01-25 MED ORDER — SODIUM CHLORIDE 0.9 % IV SOLN: INTRAVENOUS | Status: AC

## 2025-01-25 MED ORDER — SORBITOL 70 % SOLN
60.0000 mL | Freq: Once | Status: AC
  Administered 2025-01-25: 60 mL via ORAL
  Filled 2025-01-25: qty 60

## 2025-01-25 MED ORDER — SMOG ENEMA
960.0000 mL | Freq: Once | RECTAL | Status: AC
  Administered 2025-01-25: 960 mL via RECTAL
  Filled 2025-01-25 (×2): qty 960

## 2025-01-25 NOTE — Telephone Encounter (Signed)
 Can schedule TOC when she check's out after seeing blyth

## 2025-01-25 NOTE — Progress Notes (Signed)
 "                                                        PROGRESS NOTE   Subjective/Complaints:  Pt reports nausea/Abd cramping  is 50% improved, but had episode last night needed IV compazine - will make sure has it IV just in case, or PR-   Only 1 medium and 1 small BM with SMOG enema- - type 6/7.   Pt accidentally removed NG tube this AM-   Per CT results yesterday, has 2.4 cm mass in lung- will call Pulmonary to get biopsy- if possible while in hospital and then might need to call Oncology- cannot get PET scan while in hospital.     ROS: per HPI    Pt denies SOB,  (+) abd pain, CP,  (+) N/V/C/D, and vision changes  Objective:   DG Naso G Tube Plc W/Fl W/Rad Result Date: 01/24/2025 CLINICAL DATA:  54 year old female. History of symptomatic small bowel obstruction. Team is requesting a nasogastric tube for decompression purposes. EXAM: NASO G TUBE PLACEMENT WITH FL AND WITH RAD CONTRAST:  None FLUOROSCOPY: Radiation Exposure Index (if provided by the fluoroscopic device): 1.8 mGy COMPARISON:  None Available. FINDINGS: 12 Fr nasogastric tube was inserted through the right nare. Tip was advanced into the stomach using fluoroscopic guidance. Fluoroscopic images were taken and saved for this procedure. IMPRESSION: Technically successful nasogastric tube placement with tip in the stomach. This exam was performed by Delon under nurse practitioner and supervised by Dr. Juliene Balder Electronically Signed   By: Juliene Balder M.D.   On: 01/24/2025 15:38   CT ABDOMEN PELVIS W CONTRAST Result Date: 01/24/2025 CLINICAL DATA:  Abdominal pain. Distention and nausea. Patient quadriplegic. EXAM: CT ABDOMEN AND PELVIS WITH CONTRAST TECHNIQUE: Multidetector CT imaging of the abdomen and pelvis was performed using the standard protocol following bolus administration of intravenous contrast. RADIATION DOSE REDUCTION: This exam was performed according to the departmental dose-optimization program which includes  automated exposure control, adjustment of the mA and/or kV according to patient size and/or use of iterative reconstruction technique. CONTRAST:  75mL OMNIPAQUE  IOHEXOL  350 MG/ML SOLN COMPARISON:  Plain films of yesterday and today. No prior abdominopelvic CT. A chest CT of 12/29/2024 is reviewed. FINDINGS: Lower chest: Left lower lobe 2.4 cm lung nodule on 05/13 was detailed on prior diagnostic chest CT. Dependent left base airspace disease. Normal heart size without pericardial or pleural effusion. Lad coronary artery calcification. Hepatobiliary: Mild caudate and lateral segment left liver lobe prominence. Medial segment left liver lobe atrophy. Normal gallbladder, without biliary ductal dilatation. Pancreas: Normal, without mass or ductal dilatation. Spleen: Normal in size, without focal abnormality. Adrenals/Urinary Tract: Normal adrenal glands. Normal kidneys, without hydronephrosis. Normal urinary bladder. Stomach/Bowel: Normal stomach, without wall thickening. Colonic stool burden suggests constipation. Normal terminal ileum and appendix. Normal small bowel. Vascular/Lymphatic: Aortic atherosclerosis. Retroaortic left renal vein. Patent portal and splenic veins. No portal venous hypertension. No abdominopelvic adenopathy. Reproductive: Normal uterus and adnexa. Other: No significant free fluid. No free intraperitoneal air. Ventral pelvic wall hernia contains fat on image 76/2. Subcutaneous gas and nodularity are likely iatrogenic about the anterior pelvic wall. Musculoskeletal: Lumbosacral spondylosis. IMPRESSION: 1. Possible constipation. No other explanation for patient's symptoms. 2. Left lower lobe 2.4 cm pulmonary nodule, as on chest CT. Recommend multidisciplinary  thoracic oncology consultation for eventual PET. 3. Dependent left lower lobe airspace disease is favored to represent atelectasis. 4. Hepatic morphology for which mild cirrhosis is a concern. Correlate with risk factors. 5. Age advanced  coronary artery atherosclerosis. Recommend assessment of coronary risk factors. 6.  Aortic Atherosclerosis (ICD10-I70.0). These results will be called to the ordering clinician or representative by the Radiologist Assistant, and communication documented in the PACS or [employer]. Electronically Signed   By: Rockey Kilts M.D.   On: 01/24/2025 14:42   DG Abd 1 View Result Date: 01/24/2025 EXAM: 5 VIEW XRAY OF THE ABDOMEN AND PELVIS 01/24/2025 11:31:00 AM COMPARISON: 01/23/2025 CLINICAL HISTORY: 54 year old female with small bowel obstruction. FINDINGS: LUNG BASES: Negative visible lung bases. BOWEL: Decreasing small bowel distension compared to prior radiograph. Widespread gas containing but nondilated small and large bowel loops. Gas containing redundant sigmoid colon projects over the midline lower abdomen and pelvis. Moderate volume retained stool also especially at the splenic flexure and in the descending colon. Retained stool at the rectum is increased. SOFT TISSUES: No abnormal calcifications. BONES: No acute fracture. IMPRESSION: 1. No evidence of mechanical bowel obstruction with gas-filled but nondilated small and large loops. Ileus not excluded. 2. Moderate colonic stool burden, with increased in the rectum since prior. Electronically signed by: Helayne Hurst MD 01/24/2025 11:53 AM EST RP Workstation: [id number]   DG Abd 1 View Result Date: 01/23/2025 CLINICAL DATA:  Nausea, abdominal pain. EXAM: ABDOMEN - 1 VIEW COMPARISON:  None Available. FINDINGS: Gaseous distention of small bowel and colon.  Moderate stool burden. IMPRESSION: Probable ileus with moderate stool burden. Difficult to exclude a partial small bowel obstruction. Electronically Signed   By: Newell Eke M.D.   On: 01/23/2025 13:10     Recent Labs    01/23/25 0450 01/25/25 0445  WBC 6.4 8.3  HGB 10.6* 10.5*  HCT 32.0* 31.5*  PLT 175 177   Recent Labs    01/23/25 0450 01/25/25 0445  NA 134* 135  K 4.5 4.2  CL 101  101  CO2 26 26  GLUCOSE 107* 147*  BUN 21* 19  CREATININE 0.98 0.87  CALCIUM  8.6* 8.4*    Intake/Output Summary (Last 24 hours) at 01/25/2025 0935 Last data filed at 01/25/2025 0744 Gross per 24 hour  Intake 0 ml  Output 500 ml  Net -500 ml        Physical Exam: Vital Signs Blood pressure (!) 150/62, pulse 76, temperature 97.9 F (36.6 C), resp. rate 18, height 5' 9 (1.753 m), weight 115.2 kg, last menstrual period 10/28/2014, SpO2 93%.       General: awake, alert, appropriate, sitting up in bed; still looks uncomfortable but less so;  NAD HENT: conjugate gaze; oropharynx moist CV: regular rate and rhythm; no JVD Pulmonary: CTA B/L; no W/R/R- good air movement GI: still somewhat tight, but ~ 30-40% less distended and tight- still hypoactive BS Psychiatric: appropriate- less uncomfortable Neurological: Ox3   Skin: posterior neck incision with surgical dressing c/d/I as above     PRIOR EXAMS:  Musculoskeletal:  Right shoulder still limited but improving IR/ER. 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 B/L Brisk Hoffman's B/L in Ue's DTR's reduced to 2+ in Ue's and LE's. Stable appearance 1/23  Assessment/Plan: 1. Functional deficits which require 3+ hours per day of interdisciplinary therapy in a comprehensive inpatient rehab setting. Physiatrist is providing close team supervision and 24 hour management of active medical problems listed below. Physiatrist and rehab team continue to assess barriers to discharge/monitor patient progress toward functional and medical  goals  Care Tool:  Bathing    Body parts bathed by patient: Right arm, Left arm, Chest, Right upper leg, Left upper leg, Face   Body parts bathed by helper: Abdomen, Front perineal area, Buttocks, Right lower leg, Left lower leg     Bathing assist Assist Level: Maximal Assistance - Patient 24 - 49%     Upper Body Dressing/Undressing Upper body dressing   What is the patient wearing?: Hospital gown only    Upper body assist Assist Level: Minimal Assistance - Patient > 75%    Lower Body Dressing/Undressing Lower body dressing      What is the patient wearing?: Pants, Incontinence brief     Lower body assist Assist for lower body dressing: Contact Guard/Touching assist     Toileting Toileting    Toileting assist Assist for toileting: Supervision/Verbal cueing     Transfers Chair/bed transfer  Transfers assist     Chair/bed transfer assist level: Contact Guard/Touching assist     Locomotion Ambulation   Ambulation assist   Ambulation activity did not occur: Safety/medical concerns  Assist level: Contact Guard/Touching assist Assistive device: Walker-rolling Max distance: 130'   Walk 10 feet activity   Assist  Walk 10 feet activity did not occur: Safety/medical concerns  Assist level: Contact Guard/Touching assist Assistive device: Walker-rolling   Walk 50 feet activity   Assist Walk 50 feet with 2 turns activity did not occur: Safety/medical concerns  Assist level: Contact Guard/Touching assist Assistive device: Walker-rolling    Walk 150 feet activity   Assist Walk 150 feet activity did not occur: Safety/medical concerns         Walk 10 feet on uneven surface  activity   Assist Walk 10 feet on uneven surfaces activity did not occur: Safety/medical concerns         Wheelchair     Assist Is the patient using a wheelchair?: Yes Type of Wheelchair: Manual    Wheelchair assist level: Supervision/Verbal cueing Max  wheelchair distance: 130'    Wheelchair 50 feet with 2 turns activity    Assist        Assist Level: Supervision/Verbal cueing   Wheelchair 150 feet activity     Assist      Assist Level: Dependent - Patient 0%   Blood pressure (!) 150/62, pulse 76, temperature 97.9 F (36.6 C), resp. rate 18, height 5' 9 (1.753 m), weight 115.2 kg, last menstrual period 10/28/2014, SpO2 93%.  Medical Problem List and Plan: 1. 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 Supervision to min A with RW      Con't CIR PT and OT 2.  Antithrombotics: -DVT/anticoagulation:  Mechanical:  Antiembolism stockings, knee (TED hose) Bilateral lower extremities-- 01/14/25 DVT study neg -antiplatelet therapy:             -  1/23 begin daily lovenox  40mg  daily   3. Pain Management: Baclofen  5 mg BID. Oxycodone  and robaxin  PRN.  -1/16- will increase Oxy to 10-15 mg q3 hours prn- since pain only went to 8/10 with pain meds- usually 9 or higher when meds wear off. -improved 1/17 -1/20 pt appears to have adhesive capsulitis of the right shoulder with referred pain. I suspect when she was working with therapy yesterday that she met resistance and pushed past it where she felt grinding or popping as well as pain. We discussed the importance of improving her ROM and at least maintaining it.   1/23 lidocaine  patch helpful for scapular pain   -pt continues to work on right shoulder ROM   1/26- patient pain is controlled- not bad, even with therapy.  4. Mood/Behavior/Sleep: LCSW to follow for evaluation and support when available.              -antipsychotic agents: Ativan  0.5 mg bid prn   5. Neuropsych/cognition: This patient is capable of making decisions on her own behalf.  6. Skin/Wound Care: Routine pressure relief measures. Monitor incision sites for signs of infection. Hemo Vac removed  1/15.  1/23 --postop dressing soiled, loosening. New dry dressing place. Small open area at the superior aspect of the wound  7. Fluids/Electrolytes/Nutrition: Monitor I&O and weight. Follow up labs CBC/CMP                -Carb modified diet with supplements to continue.   8. Cervical spinal stenosis: s/p laminectomy by Dr. Joshua 1/12. NSGRY following, continue on decadron  taper.   9. DM2: A1c 7.1. Holding home metformin , glipizide  and januvia .   -Hyperglycemia in the setting of steroids.  SSI. Novolog , Lantus  30 units daily. Novolog  7U with meals scheduled as well.  1/19 CBGs still pretty high although better this am.   increased Lantus  to 35U daily as decadron  tapers down.  1/23-decadron  1mg  every day for 3 days then off -sugars still quite high. Will bump lantus  to 38u daily. -continue same novolog  7u with meals  -01/21/25 CBGs doing well, cont regimen  1/26- BG's running 93-155- will monitor closely today since feeling ill/nauseated- don't want her dropping too low  1/27- BG's 140s-368- went up- x1- will check/monitor more closely.   1/28- looking good, but on liquid diet CBG (last 3)  Recent Labs    01/24/25 1641 01/24/25 2151 01/25/25 0622  GLUCAP 187* 145* 168*    10.CKD IIIa: Stable, baseline range 1.1-1.5.  .   1/16- Cr 0.8 and BUN 30- is dry- will push fluids- and recheck Monday 1/19 BUN up to 38---push fluids, she should be able to take in plenty on her own.  1/22 labs reviewed and consistent--f/u Monday   1/26 Cr 0.98 and BUN 21- slightly dry- will ask pt to push fluids  1/27- will order CMP tomorrow  1/28- Cr and BUN looking better- con't to monitor- will keep her on IVF's today since not drinking a lot-  11. Acute bronchitis: CT from 1/1 without acute intrathoracic pathology. Completed 10 day course of doxycycline  1/15.               -encourage ins spiro.   12. HLD: Lipitor  80 mg   13. HTN: Holding metoprolol , lisinopril , triamterene -HCTZ. Monitor bp per protocol  for need to resume.    -1/22-24 bp controlled 1/26- Pt's BP slightly soft, will monitor esp in setting of being slightly dry Vitals:   01/21/25 1954 01/22/25 0621 01/22/25 1445  01/22/25 2010  BP: (!) 142/56 109/76 124/67 (!) 108/52   01/23/25 0434 01/23/25 0819 01/23/25 1345 01/23/25 1930  BP: (!) 110/53 128/73 117/66 (!) 104/54   01/24/25 0538 01/24/25 1305 01/24/25 1947 01/25/25 0622  BP: 118/71 (!) 116/52 124/60 (!) 150/62    14. Neurogenic bladder: Foley removed on 1/14  monitor PVRs d/t potential for urinary retention.  1/19 PVR's low/absent. Occasional incontinence  -timed emptying for now q4 while awake  15. Neurogenic bowel: Continue Senna bid.  -Will give Sorbitol  45 cc x1 this afternoon- if no BM today, will give 60cc tomorrow and SSE. -1/16- had huge BM on toilet- but still feels like could go- will increase Senna to 2 tabs BID since increasing bowel meds- and keep on Sorbitol  prn if needed- d/c'd Bowel program for now since went on toilet- but might need to come back if starts having incontinence.  -01/14/25 LBM 1/15, pt stated no bowel program yesterday. Will restart suppository, continue bowel program, advised pt to use sorbitol  today before dinner as well to encourage better output tonight with program. Could see if she can d/c bowel program again later.-- 1/18 good output with bowel program so leave in place -1/23 had large liquid bm at 1300 yesterday   -will dc senna  -maintain PM bowel program  -01/21/25 liquid BM last night without bowel program-- previously constipated so hesitant to adjust meds, so pt will tell nursing staff to call us  if persistent loose stools. Leave meds as is for now.  1/26- will make suppository prn for now, but checking KUB to  make sure not backed up and only loose stool getting through, vs ileus?-  16. Nausea and reflux--improved  -added protonix  daily--increased to 40mg  bid 1/22 with improvement  -prn tums, dc'ed maalox  1/26- added Maalox  back prn just in case.  17. Questionable partial small bowel obstruction  1/27- ordered another KUB because want to make sure not getting worse- concerned,because pt feeling worse- ordered Phenergan - d/w nursing to get done ASAP- and pt on liquid diet now- decided due to Sx's being significantly worse, will place NGT via Fluoro today and hopefully she will feel better.   1/28- removed NGT- since no output- is very constipated- 1 medium and 1 small BM- will do Sorbitol  60cc again and another SMOG enema to get stool that's moved down- maintain IVF's for 1 more day- - let nurse know can  do crackers but otherwise liquid diet.  18. 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    I spent a total of  58  minutes on total care today- >50% coordination of care- due to  D/w NP at length about her 2.4 cm mass in lung- see if can call Pulmonary to get biopsy and will also call Oncology after that- also pt removed NGT before nursing could- keep on IVF's for today    LOS: 13 days A FACE TO FACE EVALUATION WAS PERFORMED  Leola Fiore 01/25/2025, 9:35 AM     "

## 2025-01-25 NOTE — Progress Notes (Signed)
 At 01:00, patient complained of nausea, observed dry heaving. Pt declined PRN PO Compazine  and Phenergan  stated, I can't tolerate anything by mouth or through NGT. Pt requested to get nausea medicine through IV. Provider, Sharlet Schmitz, was made aware with verbal order to administer Compazine  injection. Order carried out.  04:00 Pt resting comfortably, rise and fall of chest observed. No complaints made.

## 2025-01-25 NOTE — Consult Note (Signed)
 "  NAME:  April Warren, MRN:  [id number], DOB:  16-Sep-1971, LOS: 13 ADMISSION DATE:  01/12/2025, CONSULTATION DATE:  1/28 REFERRING MD:  Cornelio, CHIEF COMPLAINT:  lung nodule    History of Present Illness:  54 year old female patient with multiple medical comorbidities as listed below.  Admitted on 1/7 after a fall at home, initially to [facility] and then subsequently transferred to [facility] for neurosurgical evaluation following an MRI that showed severe spinal canal stenosis and cord compression at C3-4 and C4-5 with subtle cord signal abnormality.  Surgical intervention was initially delayed due to acute on chronic renal failure and ultimately she went to the operating room with Dr. Joshua on 1/12 for C2-C7 posterior laminectomy with fusion Hospital course Updated by AKI, urinary retention, and hypotension.  Transferred to inpatient rehab on 1/15 where she has been undergoing physical therapy and rehabilitative services.  Her course has been complicated most recently by abdominal discomfort, nausea, and loose stools, as well as constipation.  Required disimpaction on 1/27.  Initially had had a 1 view of abdomen to evaluate her GI symptoms further,At which time she was found to have significant bowel distention and large stool burden.  Additionally a CT of abdomen was obtained to further evaluate this and on this imaging an incidental left lower lobe lung nodule was identified.   pulmonary has been asked to evaluate because of this incidental left lower lobe pulmonary nodule  Pertinent  Medical History  Type 2 diabetes with diabetic retinopathy hypertension hyperlipidemia CKD stage IIIa obesity prior follows  Significant Hospital Events: Including procedures, antibiotic start and stop dates in addition to other pertinent events     Interim History / Subjective:  No distress  Objective    Blood pressure (!) 150/62, pulse 76, temperature 97.9 F (36.6 C), resp. rate 18, height  5' 9 (1.753 m), weight 115.2 kg, last menstrual period 10/28/2014, SpO2 93%.        Intake/Output Summary (Last 24 hours) at 01/25/2025 1300 Last data filed at 01/25/2025 0744 Gross per 24 hour  Intake 0 ml  Output 500 ml  Net -500 ml   Filed Weights   01/12/25 1353  Weight: 115.2 kg    Examination: General: 54 year old female laying in bed no distress HENT: NCAT no JVD  Lungs: clear on room air speaking in full sentences  Cardiovascular: RRR Abdomen: soft  Extremities: warm  Neuro: equal st bilaterally oriented x 4  Resolved problem list   Assessment and Plan   Left lower lobe pulmonary nodule severe spinal canal stenosis and cord compression at C3-4 and C4-5  S/p r C2-C7 posterior laminectomy with fusion CKD stage 3a DM w/ retinopathy HLD Obesity  Pain H/o HTN Neurogenic bladder Constipation Diarrhea   Pulm problem list  Left lower lobe pulmonary nodule - never smoker -father smoked.  -only cancer hx is breast CA (mother)  - Will require navigational approach, with anesthesia involvement, will need to be done as outpt  Plan Will set up in our clinic for eval with one of our pulmonologist's that offer this procedure Will get CT chest for now (to assist w/ navigation)  Will defer to out pt Pulm f/u as to if they want PET scan first.   Labs   CBC: Recent Labs  Lab 01/19/25 0602 01/23/25 0450 01/25/25 0445  WBC 8.0 6.4 8.3  NEUTROABS  --   --  6.0  HGB 11.9* 10.6* 10.5*  HCT 35.5* 32.0* 31.5*  MCV 91.7 92.8 91.8  PLT 243 175 177    Basic Metabolic Panel: Recent Labs  Lab 01/19/25 0602 01/23/25 0450 01/25/25 0445  NA 136 134* 135  K 4.7 4.5 4.2  CL 101 101 101  CO2 25 26 26   GLUCOSE 158* 107* 147*  BUN 35* 21* 19  CREATININE 0.86 0.98 0.87  CALCIUM  9.4 8.6* 8.4*   GFR: Estimated Creatinine Clearance: 100.1 mL/min (by C-G formula based on SCr of 0.87 mg/dL). Recent Labs  Lab 01/19/25 0602 01/23/25 0450 01/25/25 0445  WBC 8.0 6.4  8.3    Liver Function Tests: Recent Labs  Lab 01/25/25 0445  AST 22  ALT 23  ALKPHOS 89  BILITOT 1.4*  PROT 5.9*  ALBUMIN 3.0*   No results for input(s): LIPASE, AMYLASE in the last 168 hours. No results for input(s): AMMONIA in the last 168 hours.  ABG No results found for: PHART, PCO2ART, PO2ART, HCO3, TCO2, ACIDBASEDEF, O2SAT   Coagulation Profile: No results for input(s): INR, PROTIME in the last 168 hours.  Cardiac Enzymes: No results for input(s): CKTOTAL, CKMB, CKMBINDEX, TROPONINI in the last 168 hours.  HbA1C: Hgb A1c MFr Bld  Date/Time Value Ref Range Status  01/05/2025 06:25 PM 7.1 (H) 4.8 - 5.6 % Final    Comment:    (NOTE) Diagnosis of Diabetes The following HbA1c ranges recommended by the American Diabetes Association (ADA) may be used as an aid in the diagnosis of diabetes mellitus.  Hemoglobin             Suggested A1C NGSP%              Diagnosis  <5.7                   Non Diabetic  5.7-6.4                Pre-Diabetic  >6.4                   Diabetic  <7.0                   Glycemic control for                       adults with diabetes.    09/22/2024 10:20 AM 8.1 (H) 4.6 - 6.5 % Final    Comment:    Glycemic Control Guidelines for People with Diabetes:Non Diabetic:  <6%Goal of Therapy: <7%Additional Action Suggested:  >8%     CBG: Recent Labs  Lab 01/24/25 0543 01/24/25 1135 01/24/25 1641 01/24/25 2151 01/25/25 0622  GLUCAP 165* 157* 187* 145* 168*    Review of Systems:   Review of Systems  Constitutional: Negative.   HENT: Negative.    Eyes: Negative.   Respiratory: Negative.    Cardiovascular: Negative.   Gastrointestinal:  Positive for constipation and diarrhea. Negative for abdominal pain.  Genitourinary: Negative.   Musculoskeletal: Negative.   Skin: Negative.   Neurological:  Positive for weakness.  Endo/Heme/Allergies: Negative.   Psychiatric/Behavioral: Negative.       Past  Medical History:  She,  has a past medical history of Allergy, Asthma, Chicken pox (54 yrs old), Diabetes mellitus (08/12/2012), Diabetic retinopathy (HCC), Dysmenorrhea (08/20/2012), HTN (hypertension) (08/20/2012), Hyperlipidemia, mixed (08/28/2013), Neuromuscular disorder (HCC), Neuropathy (02/16/2017), Obesity, Obesity, unspecified (08/28/2013), Other and unspecified hyperlipidemia (08/28/2013), Preventative health care (08/20/2012), Sinusitis (11/14/2013), and Sinusitis, acute (05/02/2016).   Surgical History:   Past Surgical History:  Procedure  Laterality Date   BREAST BIOPSY Right 01/29/2024   US  RT BREAST BX W LOC DEV 1ST LESION IMG BX SPEC US  GUIDE 01/29/2024 [facility] MAMMOGRAPHY   EYE SURGERY     Laser surgery for bleeds   POSTERIOR CERVICAL FUSION/FORAMINOTOMY N/A 01/09/2025   Procedure: POSTERIOR CERVICAL FUSION/FORAMINOTOMY LEVEL 5;  Surgeon: Joshua Alm Hamilton, MD;  Location: [facility] OR;  Service: Neurosurgery;  Laterality: N/A;  C2-C7 Posterior laminectomy with a lateral mass fusion and fixation   REFRACTIVE SURGERY  08/12/2012   right, left on 09/16/12     Social History:   reports that she has never smoked. She has never used smokeless tobacco. She reports current alcohol  use. She reports that she does not use drugs.   Family History:  Her family history includes Alcohol  abuse in her paternal grandfather; Arthritis in her father; Asthma in her father and maternal grandmother; COPD in her father and maternal grandmother; Cancer in her brother; Cancer (age of onset: 33) in her mother; Diabetes in her mother; Gout in her brother; Heart attack in her maternal grandfather; Heart disease in her paternal grandfather; Hyperlipidemia in her father and maternal grandmother; Hypertension in her father, maternal grandfather, maternal grandmother, and mother; Stroke in her maternal grandfather.   Allergies Allergies[1]   Home Medications  Prior to Admission medications  Medication Sig Start Date  End Date Taking? Authorizing Provider  albuterol  (PROAIR  HFA) 108 (90 Base) MCG/ACT inhaler USE 2 INHALATIONS EVERY 6 HOURS AS NEEDED FOR WHEEZING OR SHORTNESS OF BREATH Patient taking differently: 2 puffs every 6 (six) hours as needed for wheezing or shortness of breath. 09/23/21   [name] LABOR, MD  aspirin EC 81 MG tablet Take 81 mg by mouth daily. Swallow whole.    [provider]  atorvastatin  (LIPITOR ) 80 MG tablet TAKE 1 TABLET DAILY 05/03/24   [name] LABOR, MD  Blood Glucose Monitoring Suppl (FREESTYLE FREEDOM LITE) w/Device KIT Check blood sugar twice daily 10/14/17   [name] LABOR, MD  cetirizine  (ZYRTEC ) 10 MG tablet Take 1 tablet (10 mg total) by mouth daily as needed for allergies or rhinitis. 12/20/24   [name] LABOR, MD  ezetimibe  (ZETIA ) 10 MG tablet Take 1 tablet (10 mg total) by mouth daily. 12/30/24   [name] LABOR, MD  fenofibrate  micronized (LOFIBRA) 134 MG capsule Take 1 capsule (134 mg total) by mouth daily before breakfast. 09/05/24   [name] LABOR, MD  gabapentin  (NEURONTIN ) 300 MG capsule TAKE 1 CAPSULE TWICE A DAY AND 2 CAPSULES AT BEDTIME 05/03/24   [name] LABOR, MD  glipiZIDE  (GLUCOTROL ) 10 MG tablet Take 0.5 tablets (5 mg total) by mouth 2 (two) times daily before a meal. Patient taking differently: Take 10 mg by mouth daily before breakfast. 01/26/24   [name] LABOR, MD  glucose blood test strip Use as instructed 02/12/18   [name] LABOR, MD  JANUVIA  100 MG tablet TAKE 1 TABLET DAILY 11/07/24   [name] LABOR, MD  Lancets (FREESTYLE) lancets Check blood sugar twice daily 11/17/17   [name] LABOR, MD  lisinopril  (ZESTRIL ) 20 MG tablet Take 1 tablet (20 mg total) by mouth 2 (two) times daily. 12/05/24   [name] LABOR, MD  LORazepam  (ATIVAN ) 1 MG tablet TAKE 1/2 TABLET BY MOUTH TWICE DAILY AS NEEDED FOR ANXIETY Patient taking differently: Take 0.5 mg by mouth at bedtime. 11/15/24   [name] LABOR, MD  Magnesium  Oxide 400 MG CAPS Take 1 capsule by  mouth daily.  Pt is taking 825mg  capsule every other day. Patient taking differently: Take 1 capsule by mouth daily.    [provider]  metFORMIN  (GLUCOPHAGE -XR) 750 MG 24 hr tablet Take 1 tablet (750 mg total) by mouth daily with breakfast. 06/13/24   [name] LABOR, MD  metoprolol  tartrate (LOPRESSOR ) 50 MG tablet TAKE 1 TABLET TWICE A DAY 07/26/24   [name] LABOR, MD  montelukast  (SINGULAIR ) 10 MG tablet TAKE 1 TABLET AT BEDTIME 02/09/24   [name] LABOR, MD  Potassium Bicarbonate 99 MG CAPS Take 2 capsules by mouth daily.    [provider]  triamterene -hydrochlorothiazide  (MAXZIDE-25) 37.5-25 MG tablet TAKE 1 TABLET DAILY 07/26/24   [name] LABOR, MD  Vitamin D , Ergocalciferol , (DRISDOL ) 1.25 MG (50000 UNIT) CAPS capsule TAKE 1 CAPSULE EVERY 7 DAYS 12/11/23   Douglass Caul B, FNP     Critical care time: NA              [1]  Allergies Allergen Reactions   Shellfish Allergy Itching and Other (See Comments)    Tingling of tongue and throat    "

## 2025-01-25 NOTE — Progress Notes (Addendum)
 Recreational Therapy Session Note  Patient Details  Name: April Warren MRN: [id number] Date of Birth: 05/24/1971 Today's Date: 01/25/2025  Pain: no c/o Goal:  Pt will be provided with verbal discussion and handout on healthy coping strategies.  MET Skilled Therapeutic Interventions/Progress Updates: Met with pt today to continue stress management/coping education emphasizing healthy coping strategies.  Briefly reviewed stress exploration including factors that contribute to stress and factors that protect against stress. Specific healthy coping strategies discussed include deep breathing, progressive muscle, challenging irrational thoughts and imagery.  Pt stated understanding.  Xoe Hoe 01/25/2025, 2:09 PM

## 2025-01-25 NOTE — Progress Notes (Signed)
 Occupational Therapy Session Note  Patient Details  Name: April Warren MRN: [id number] Date of Birth: Aug 19, 1971  Today's Date: 01/25/2025 OT Individual Time: 1300-1355 OT Individual Time Calculation (min): 55 min    Short Term Goals: Week 2:  OT Short Term Goal 1 (Week 2): Patient to increase AROM into R shoulder flexion to 150 degrees OT Short Term Goal 2 (Week 2): patient will complete toileting 3/3 CGA OT Short Term Goal 3 (Week 2): patient will complete LB bathing UB/LB SUP A  Skilled Therapeutic Interventions/Progress Updates:      Therapy Documentation Precautions:  Precautions Precautions: Fall Recall of Precautions/Restrictions: Intact Precaution/Restrictions Comments: cervical Restrictions Weight Bearing Restrictions Per Provider Order: No General: Pt seated in W/C upon OT arrival, agreeable to OT.  Pain:  4/10 pain reported in Rt shoulder, empathetic discussion and distractions provided for pain management. OT offering heat modality, pt declining, reports tolerable to proceed.   Other Treatments: OT providing skilled intervention on mental health, stress management/coping education emphasizing healthy coping strategies. OT providing specific examples of healthy coping mechanisms include deep breathing, progressive muscle, challenging irrational thoughts and imagery. Pt discussing her stressors, coping strategies, etc with OT. Pt reporting useful education for decreasing anxieties. Pt understanding of all information.   Pt supine in bed with bed alarm activated, 2 bed rails up, call light within reach and 4Ps assessed. Handoff to pulmonary team.    Therapy/Group: individual therapy  Lucretia Pendley, OTD, OTR/L 01/25/2025, 6:48 PM

## 2025-01-25 NOTE — Progress Notes (Signed)
 Physical Therapy Session Note  Patient Details  Name: April Warren MRN: [id number] Date of Birth: Jan 10, 1971  Today's Date: 01/25/2025 PT Individual Time: 0900-0929 PT Individual Time Calculation (min): 29 min   Short Term Goals: Week 2:  PT Short Term Goal 1 (Week 2): Pt will perform bed mobility with CGA or better PT Short Term Goal 2 (Week 2): Pt will ambulate 150 ft without w/c follow PT Short Term Goal 3 (Week 2): Pt will initiate stair training for strength and stabiltiy  Skilled Therapeutic Interventions/Progress Updates:      Pt supine in bed upon arrival. Pt agreeable to therapy. Pt deneis any pain or nausea. Pt endorses feeling better since the NG tube came out. Pt endorses she pulled the NG tube out herself. Notified nurse, PA, MD. Nurse already aware.   Supine to sit with mod A.   Pt donned shorts while seated EOB with SBA. Sit to stand with RW and CGA from elevated bed. Donned pants with min A with unilateral UE support on RW. SPT RW and CGA.   Pt performed standing marching 2x10 with CGA, verbal cues provided for increased hip and knee flexion.   Pt seated in WC at end of session with all needs within reach and chair alarm on.     Therapy Documentation Precautions:  Precautions Precautions: Fall Recall of Precautions/Restrictions: Intact Precaution/Restrictions Comments: cervical Restrictions Weight Bearing Restrictions Per Provider Order: No  Therapy/Group: Individual Therapy  [facility] April Warren, [place], DPT  01/25/2025, 7:38 AM

## 2025-01-25 NOTE — Progress Notes (Signed)
 Physical Therapy Session Note  Patient Details  Name: April Warren MRN: [id number] Date of Birth: 05/25/1971  Today's Date: 01/25/2025 PT Individual Time: 8984-8884 PT Individual Time Calculation (min): 60 min   Short Term Goals: Week 2:  PT Short Term Goal 1 (Week 2): Pt will perform bed mobility with CGA or better PT Short Term Goal 2 (Week 2): Pt will ambulate 150 ft without w/c follow PT Short Term Goal 3 (Week 2): Pt will initiate stair training for strength and stabiltiy  Skilled Therapeutic Interventions/Progress Updates:   Tx1: Pt missed 60 min skilled PT due to cramping/nausea and awaiting NGT removal. PTA made x 2 attempts to visit pt. Pt in bed with current but improved abdominal nausea/cramping. Pt continues to await for NGT removal. Will continue efforts as schedule permits.  Tx2: Pt presented in w/c agreeable to therapy. Pt noted NGT removed. Pt transported to day room and participated in Cybex Kinetron 50cm/sec x 3 min prior to mobility. Pt stood with close supervision and ambulated ~46ft x2 with RW and CGA, noted to continue to demonstrate genu recurvatum however more controlled then when last seen by this therapist. After gait pt indicating possible need for bathroom however LEs feeling too fatigued to ambulate. Pt was able to propel partial distance to room then transported remaining distance to room. Pt ambulated ~29ft with CGA to bathroom and completed transfers and clothing management (+ urinary void and BM). Pt able to manage front portion of peri-care but required total A for buttocks due to difficulty reaching. Pt was CGA for stand and clothing management. Pt ambulated back to recliner and performed 2 x 10 SLR in reclined position and 2 x 10 manually resisted leg press. During this time PTA placed hot pack on R shoulder. Once LEs therex completed PTA worked on upper traps and levator due to TTP and trigger points with PTA able to release a few. Pt left in recliner at end of  session with call bell within reach and needs met.      Therapy Documentation Precautions:  Precautions Precautions: Fall Recall of Precautions/Restrictions: Intact Precaution/Restrictions Comments: cervical Restrictions Weight Bearing Restrictions Per Provider Order: No General:      Therapy/Group: Individual Therapy  Tilda Samudio 01/25/2025, 4:36 PM

## 2025-01-25 NOTE — Telephone Encounter (Signed)
 Hospital follow up scheduled with Dr.Hattar on February 23

## 2025-01-26 LAB — BASIC METABOLIC PANEL WITH GFR
Anion gap: 10 (ref 5–15)
BUN: 12 mg/dL (ref 6–20)
CO2: 24 mmol/L (ref 22–32)
Calcium: 8 mg/dL — ABNORMAL LOW (ref 8.9–10.3)
Chloride: 103 mmol/L (ref 98–111)
Creatinine, Ser: 0.77 mg/dL (ref 0.44–1.00)
GFR, Estimated: >60 mL/min
Glucose, Bld: 94 mg/dL (ref 70–99)
Potassium: 3.3 mmol/L — ABNORMAL LOW (ref 3.5–5.1)
Sodium: 137 mmol/L (ref 135–145)

## 2025-01-26 LAB — CBC
HCT: 28.7 % — ABNORMAL LOW (ref 36.0–46.0)
Hemoglobin: 9.7 g/dL — ABNORMAL LOW (ref 12.0–15.0)
MCH: 31.4 pg (ref 26.0–34.0)
MCHC: 33.8 g/dL (ref 30.0–36.0)
MCV: 92.9 fL (ref 80.0–100.0)
Platelets: 180 10*3/uL (ref 150–400)
RBC: 3.09 MIL/uL — ABNORMAL LOW (ref 3.87–5.11)
RDW: 12.4 % (ref 11.5–15.5)
WBC: 9.5 10*3/uL (ref 4.0–10.5)
nRBC: 0 % (ref 0.0–0.2)

## 2025-01-26 LAB — GLUCOSE, CAPILLARY
Glucose-Capillary: 104 mg/dL — ABNORMAL HIGH (ref 70–99)
Glucose-Capillary: 212 mg/dL — ABNORMAL HIGH (ref 70–99)
Glucose-Capillary: 197 mg/dL — ABNORMAL HIGH (ref 70–99)
Glucose-Capillary: 110 mg/dL — ABNORMAL HIGH (ref 70–99)

## 2025-01-26 MED ORDER — POTASSIUM CHLORIDE CRYS ER 20 MEQ PO TBCR
40.0000 meq | EXTENDED_RELEASE_TABLET | Freq: Once | ORAL | Status: AC
  Administered 2025-01-26: 40 meq via ORAL
  Filled 2025-01-26: qty 2

## 2025-01-26 MED ORDER — MAGNESIUM CITRATE PO SOLN
1.0000 | Freq: Once | ORAL | Status: AC
  Administered 2025-01-26: 1 via ORAL
  Filled 2025-01-26: qty 296

## 2025-01-26 NOTE — Progress Notes (Signed)
 Occupational Therapy Session Note  Patient Details  Name: April Warren MRN: [id number] Date of Birth: 07-03-1971  Today's Date: 01/26/2025 OT Individual Time: 8959-8874 OT Individual Time Calculation (min): 45 min    Short Term Goals: Week 2:  OT Short Term Goal 1 (Week 2): Patient to increase AROM into R shoulder flexion to 150 degrees OT Short Term Goal 2 (Week 2): patient will complete toileting 3/3 CGA OT Short Term Goal 3 (Week 2): patient will complete LB bathing UB/LB SUP A  Skilled Therapeutic Interventions/Progress Updates:      Therapy Documentation Precautions:  Precautions Precautions: Fall Recall of Precautions/Restrictions: Intact Precaution/Restrictions Comments: cervical Restrictions Weight Bearing Restrictions Per Provider Order: No General: Pt seated in W/C upon OT arrival, agreeable to OT. Husband present  Pain:  2/10 pain reported in Rt shoulder, activity, intermittent rest breaks, distractions provided for pain management, pt reports tolerable to proceed.   ADL: OT providing skilled intervention on ADL retraining in order to increase independence with tasks and increase activity tolerance. Pt completed the following tasks at the current level of assist: Toilet transfer: CGA ambulating from room>BSC over toilet with RW, pt no LOB/SOB Toileting: Min A for hygiene, small BM, assistance required for hygiene after BM UB dressing: Min A for overhead d/t limited ROM of shoulders LB dressing: CGA in standing for managing pants over waist with no UE support Footwear: total A donning socks, Mod A to doff with Scientist, [employer]) transfer: CGA from toilet> TTB with RW, no LOB Bathing: Min A for hair, pt husband actively assisting with ADL this session for family education for D/C prep   Pt seated in W/C at end of session with W/C alarm donned, call light within reach and 4Ps assessed. Husband present   Therapy/Group: Individual Therapy  Camie Hoe, OTD,  OTR/L 01/26/2025, 12:55 PM

## 2025-01-26 NOTE — Progress Notes (Signed)
 Physical Therapy Session Note  Patient Details  Name: April Warren MRN: [id number] Date of Birth: 1971/12/16  Today's Date: 01/26/2025 PT Individual Time: 1305-1350 PT Individual Time Calculation (min): 45 min   Short Term Goals: Week 2:  PT Short Term Goal 1 (Week 2): Pt will perform bed mobility with CGA or better PT Short Term Goal 2 (Week 2): Pt will ambulate 150 ft without w/c follow PT Short Term Goal 3 (Week 2): Pt will initiate stair training for strength and stabiltiy  Skilled Therapeutic Interventions/Progress Updates: Pt presented in w/c agreeable to therapy. Pt states pain well controlled continues to have some nausea. Pt transported to main gym for time management and participated in seated LE therex prior to mobility. Performed LAQ with 2.5lb weight 2 x 10, hip flexion 2.5lb weight 2 x 10, hip ER with red theraband 2 x 10, and hamstring pulls with green theraband 2 x 10. Pt then ambulated to stair case and worked on step ups on 3in step 2 x 8 on RLE and x 6 on LLE. Pt noted to have improved eccentric control with RLE as compared when previously done with this therapist. Pt continues to demonstrate significant genu recurvatum with LLE. Pt then ambulated over to 6in steps and worked on hip flexion to second step for increased hip flexor recruitment with RLE x 10 and was able to complete x 4 with LLE. Pt transported back to room and completed ambulatory transfer to bathroom. Pt able to complete clothing management with CGA and pt left at toilet verbalizing understanding to pull call bell once completed.      Therapy Documentation Precautions:  Precautions Precautions: Fall Recall of Precautions/Restrictions: Intact Precaution/Restrictions Comments: cervical Restrictions Weight Bearing Restrictions Per Provider Order: No  Therapy/Group: Individual Therapy  Militza Devery 01/26/2025, 3:22 PM

## 2025-01-26 NOTE — Progress Notes (Signed)
 Physical Therapy Session Note  Patient Details  Name: April Warren MRN: [id number] Date of Birth: 09-04-1971  Today's Date: 01/26/2025 PT Individual Time: 0800-0900 PT Individual Time Calculation (min): 60 min   Short Term Goals: Week 2:  PT Short Term Goal 1 (Week 2): Pt will perform bed mobility with CGA or better PT Short Term Goal 2 (Week 2): Pt will ambulate 150 ft without w/c follow PT Short Term Goal 3 (Week 2): Pt will initiate stair training for strength and stabiltiy  Skilled Therapeutic Interventions/Progress Updates:    pt received in bed and agreeable to therapy. Pt reports pain controlled but still troubled by nausea. Encouraged pt regarding physical activity to helo with constipation. Pt also reports feeling weak from not eating, encouraged her to eat enough to cover insulin  given and promote gastrocolic reflex. Bed mobility with mod a for trunk elevation d/t weakness. Stand pivot transfer with CGA and RW. Pt then ambulated 4 x 40-80 ft with CGA and RW. Short bouts d/t feeling of weakness, but improving with repetition. Pt then participated in Sit to stand + marching for dynamic balance and to promote trunk rotation to assist with peristalsis. Pt return to room and remained in w/c, was left with all needs in reach and alarm active.   Therapy Documentation Precautions:  Precautions Precautions: Fall Recall of Precautions/Restrictions: Intact Precaution/Restrictions Comments: cervical Restrictions Weight Bearing Restrictions Per Provider Order: No General:       Therapy/Group: Individual Therapy  [name] 01/26/2025, 9:17 AM

## 2025-01-26 NOTE — Progress Notes (Signed)
 Recreational Therapy Session Note  Patient Details  Name: April Warren MRN: [id number] Date of Birth: [birth year] Today's Date: 01/26/2025  Pain: c/o 8/10 neck and shoulder pain, reports slept on wrong last night increasing pain, RN made aware to medicate  Goal:  Pt will actively participate in 60 minute therapeutic dance group with supervision.  MET  Dance Group: Pt participated in dance group with an emphasis on social interaction, motor planning, increasing overall activity tolerance and bimanual tasks. All songs were selected by group members. Dance moves included AROM of BUE/BLE gross motor movements with an emphasis on building functional endurance.    Individual level documentation: Patient completed group from sitting level. Patient needed supervision to complete various dance moves with demonstration cues.  Patient able to create her own modifications during group.  Therapy/Group: Group Therapy  Mireya Meditz 01/26/2025, 3:39 PM

## 2025-01-26 NOTE — Group Note (Signed)
 Patient Details Name: April Warren MRN: [id number] DOB: 1971-08-20 Today's Date: 01/26/2025  Time Calculation: OT Group Time Calculation OT Group Start Time: 1400 OT Group Stop Time: 1500 OT Group Time Calculation (min): 60 min      Group Description: Dance Group: Pt participated in dance group with an emphasis on social interaction, motor planning, increasing overall activity tolerance and bimanual tasks. All songs were selected by group members. Dance moves included AROM of BUE/BLE gross motor movements with an emphasis on building functional endurance.   Individual level documentation: Patient completed group from sitting level. Patientt needed supervision to complete various dance moves with OT providing visual model.  Patient able to create her own modifications during group.  Pain: Pain Assessment Pain Scale: 0-10 Pain Score: 8  Pain Location: Neck Pain Intervention(s): Medication (See eMAR) Informed RN who provided medications. OT provided rest breaks and repositioning.   Precautions: Cervical, falls   April Warren 01/26/2025, 3:23 PM

## 2025-01-26 NOTE — Progress Notes (Signed)
 "                                                        PROGRESS NOTE   Subjective/Complaints:  Pt reports abdomen is still bloated and cramping some.   Had 2 runny stools overnight-  large BM type 6 at 1130 and type 7 small at 12am Enema just ran out of me as fast as it went in  Back to regular diet.  Nausea still there- is drinking 3-4 cups/water/day- educated pt that needs to drink 6-8 cups/day.    Last took opiates Sunday per pt.    ROS: per HPI    Pt denies SOB, still has some (+)abd pain, CP,  (+) N/V/C/D, and vision changes   Objective:   DG Naso G Tube Plc W/Fl W/Rad Result Date: 01/24/2025 CLINICAL DATA:  54 year old female. History of symptomatic small bowel obstruction. Team is requesting a nasogastric tube for decompression purposes. EXAM: NASO G TUBE PLACEMENT WITH FL AND WITH RAD CONTRAST:  None FLUOROSCOPY: Radiation Exposure Index (if provided by the fluoroscopic device): 1.8 mGy COMPARISON:  None Available. FINDINGS: 12 Fr nasogastric tube was inserted through the right nare. Tip was advanced into the stomach using fluoroscopic guidance. Fluoroscopic images were taken and saved for this procedure. IMPRESSION: Technically successful nasogastric tube placement with tip in the stomach. This exam was performed by Delon under nurse practitioner and supervised by Dr. Juliene Balder Electronically Signed   By: Juliene Balder M.D.   On: 01/24/2025 15:38   CT ABDOMEN PELVIS W CONTRAST Result Date: 01/24/2025 CLINICAL DATA:  Abdominal pain. Distention and nausea. Patient quadriplegic. EXAM: CT ABDOMEN AND PELVIS WITH CONTRAST TECHNIQUE: Multidetector CT imaging of the abdomen and pelvis was performed using the standard protocol following bolus administration of intravenous contrast. RADIATION DOSE REDUCTION: This exam was performed according to the departmental dose-optimization program which includes automated exposure control, adjustment of the mA and/or kV according to patient  size and/or use of iterative reconstruction technique. CONTRAST:  75mL OMNIPAQUE  IOHEXOL  350 MG/ML SOLN COMPARISON:  Plain films of yesterday and today. No prior abdominopelvic CT. A chest CT of 12/29/2024 is reviewed. FINDINGS: Lower chest: Left lower lobe 2.4 cm lung nodule on 05/13 was detailed on prior diagnostic chest CT. Dependent left base airspace disease. Normal heart size without pericardial or pleural effusion. Lad coronary artery calcification. Hepatobiliary: Mild caudate and lateral segment left liver lobe prominence. Medial segment left liver lobe atrophy. Normal gallbladder, without biliary ductal dilatation. Pancreas: Normal, without mass or ductal dilatation. Spleen: Normal in size, without focal abnormality. Adrenals/Urinary Tract: Normal adrenal glands. Normal kidneys, without hydronephrosis. Normal urinary bladder. Stomach/Bowel: Normal stomach, without wall thickening. Colonic stool burden suggests constipation. Normal terminal ileum and appendix. Normal small bowel. Vascular/Lymphatic: Aortic atherosclerosis. Retroaortic left renal vein. Patent portal and splenic veins. No portal venous hypertension. No abdominopelvic adenopathy. Reproductive: Normal uterus and adnexa. Other: No significant free fluid. No free intraperitoneal air. Ventral pelvic wall hernia contains fat on image 76/2. Subcutaneous gas and nodularity are likely iatrogenic about the anterior pelvic wall. Musculoskeletal: Lumbosacral spondylosis. IMPRESSION: 1. Possible constipation. No other explanation for patient's symptoms. 2. Left lower lobe 2.4 cm pulmonary nodule, as on chest CT. Recommend multidisciplinary thoracic oncology consultation for eventual PET. 3. Dependent left lower lobe airspace disease is favored to  represent atelectasis. 4. Hepatic morphology for which mild cirrhosis is a concern. Correlate with risk factors. 5. Age advanced coronary artery atherosclerosis. Recommend assessment of coronary risk factors. 6.   Aortic Atherosclerosis (ICD10-I70.0). These results will be called to the ordering clinician or representative by the Radiologist Assistant, and communication documented in the PACS or [employer]. Electronically Signed   By: Rockey Kilts M.D.   On: 01/24/2025 14:42   DG Abd 1 View Result Date: 01/24/2025 EXAM: 5 VIEW XRAY OF THE ABDOMEN AND PELVIS 01/24/2025 11:31:00 AM COMPARISON: 01/23/2025 CLINICAL HISTORY: 54 year old female with small bowel obstruction. FINDINGS: LUNG BASES: Negative visible lung bases. BOWEL: Decreasing small bowel distension compared to prior radiograph. Widespread gas containing but nondilated small and large bowel loops. Gas containing redundant sigmoid colon projects over the midline lower abdomen and pelvis. Moderate volume retained stool also especially at the splenic flexure and in the descending colon. Retained stool at the rectum is increased. SOFT TISSUES: No abnormal calcifications. BONES: No acute fracture. IMPRESSION: 1. No evidence of mechanical bowel obstruction with gas-filled but nondilated small and large loops. Ileus not excluded. 2. Moderate colonic stool burden, with increased in the rectum since prior. Electronically signed by: Helayne Hurst MD 01/24/2025 11:53 AM EST RP Workstation: [id number]     Recent Labs    01/25/25 0445 01/26/25 0517  WBC 8.3 9.5  HGB 10.5* 9.7*  HCT 31.5* 28.7*  PLT 177 180   Recent Labs    01/25/25 0445 01/26/25 0517  NA 135 137  K 4.2 3.3*  CL 101 103  CO2 26 24  GLUCOSE 147* 94  BUN 19 12  CREATININE 0.87 0.77  CALCIUM  8.4* 8.0*    Intake/Output Summary (Last 24 hours) at 01/26/2025 1021 Last data filed at 01/26/2025 0352 Gross per 24 hour  Intake 1074.42 ml  Output --  Net 1074.42 ml        Physical Exam: Vital Signs Blood pressure 128/63, pulse 74, temperature 98.5 F (36.9 C), temperature source Oral, resp. rate 19, height 5' 9 (1.753 m), weight 115.2 kg, last menstrual period 10/28/2014, SpO2  98%.        General: awake, alert, appropriate,  sitting up in bed and then in w/c; 2nd time, husband at bedside;  NAD HENT: conjugate gaze; oropharynx moist CV: regular rate and rhythm; no JVD Pulmonary: CTA B/L; no W/R/R- good air movement GI: somewhat firm still; almost tympanic sounding/due to gas; somewhat distended- slightly more than yesterday;  mildly TTP no rebound; slightly hypoactive Psychiatric: appropriate Neurological: Ox3   Skin: posterior neck incision with surgical dressing c/d/I as above     PRIOR EXAMS:  Musculoskeletal:  Right shoulder still limited but improving IR/ER. 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 B/L Brisk Hoffman's B/L in Ue's DTR's reduced to 2+ in Ue's and LE's. Stable appearance 1/23  Assessment/Plan: 1. Functional deficits which require 3+ hours per day of interdisciplinary therapy in a comprehensive inpatient rehab setting. Physiatrist is providing close team supervision and 24 hour management of active  medical problems listed below. Physiatrist and rehab team continue to assess barriers to discharge/monitor patient progress toward functional and medical goals  Care Tool:  Bathing    Body parts bathed by patient: Right arm, Left arm, Chest, Right upper leg, Left upper leg, Face   Body parts bathed by helper: Abdomen, Front perineal area, Buttocks, Right lower leg, Left lower leg     Bathing assist Assist Level: Maximal Assistance - Patient 24 - 49%     Upper Body Dressing/Undressing Upper body dressing   What is the patient wearing?:  Hospital gown only    Upper body assist Assist Level: Minimal Assistance - Patient > 75%    Lower Body Dressing/Undressing Lower body dressing      What is the patient wearing?: Pants, Incontinence brief     Lower body assist Assist for lower body dressing: Contact Guard/Touching assist     Toileting Toileting    Toileting assist Assist for toileting: Supervision/Verbal cueing     Transfers Chair/bed transfer  Transfers assist     Chair/bed transfer assist level: Contact Guard/Touching assist     Locomotion Ambulation   Ambulation assist   Ambulation activity did not occur: Safety/medical concerns  Assist level: Contact Guard/Touching assist Assistive device: Walker-rolling Max distance: 130'   Walk 10 feet activity   Assist  Walk 10 feet activity did not occur: Safety/medical concerns  Assist level: Contact Guard/Touching assist Assistive device: Walker-rolling   Walk 50 feet activity   Assist Walk 50 feet with 2 turns activity did not occur: Safety/medical concerns  Assist level: Contact Guard/Touching assist Assistive device: Walker-rolling    Walk 150 feet activity   Assist Walk 150 feet activity did not occur: Safety/medical concerns         Walk 10 feet on uneven surface  activity   Assist Walk 10 feet on uneven surfaces activity did not occur: Safety/medical concerns         Wheelchair     Assist Is the patient using a wheelchair?: Yes Type of Wheelchair: Manual    Wheelchair assist level: Supervision/Verbal cueing Max wheelchair distance: 130'    Wheelchair 50 feet with 2 turns activity    Assist        Assist Level: Supervision/Verbal cueing   Wheelchair 150 feet activity     Assist      Assist Level: Dependent - Patient 0%   Blood pressure 128/63, pulse 74, temperature 98.5 F (36.9 C), temperature source Oral, resp. rate 19, height 5' 9 (1.753 m), weight 115.2 kg, last menstrual period  10/28/2014, SpO2 98%.  Medical Problem List and Plan: 1. 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 Supervision to min A with RW      Con't CIR PT and OT 2.  Antithrombotics: -DVT/anticoagulation:  Mechanical:  Antiembolism stockings, knee (TED hose) Bilateral lower extremities-- 01/14/25 DVT study neg -antiplatelet therapy:             -1/23 begin daily lovenox  40mg  daily   1/29- will need 1-2 months after d/c.  3. Pain Management: Baclofen  5 mg BID. Oxycodone  and robaxin  PRN.  -1/16- will increase Oxy to 10-15 mg q3 hours prn- since pain only went to 8/10 with pain meds- usually 9 or higher when meds wear off. -improved 1/17 -1/20 pt appears to have adhesive capsulitis of the  right shoulder with referred pain. I suspect when she was working with therapy yesterday that she met resistance and pushed past it where she felt grinding or popping as well as pain. We discussed the importance of improving her ROM and at least maintaining it.   1/23 lidocaine  patch helpful for scapular pain   -pt continues to work on right shoulder ROM   1/26- patient pain is controlled- not bad, even with therapy.   1/29- hasn't taken opiates since Sunday 4. Mood/Behavior/Sleep: LCSW to follow for evaluation and support when available.              -antipsychotic agents: Ativan  0.5 mg bid prn   5. Neuropsych/cognition: This patient is capable of making decisions on her own behalf.  6. Skin/Wound Care: Routine pressure relief measures. Monitor incision sites for signs of infection. Hemo Vac removed 1/15.  1/23 --postop dressing soiled, loosening. New dry dressing place. Small open area at the superior aspect of the wound  7. Fluids/Electrolytes/Nutrition: Monitor I&O and weight. Follow up labs CBC/CMP                -Carb modified diet with supplements to continue.   8. Cervical spinal  stenosis: s/p laminectomy by Dr. Joshua 1/12. NSGRY following, continue on decadron  taper.   9. DM2: A1c 7.1. Holding home metformin , glipizide  and januvia .   -Hyperglycemia in the setting of steroids.  SSI. Novolog , Lantus  30 units daily. Novolog  7U with meals scheduled as well.  1/19 CBGs still pretty high although better this am.   increased Lantus  to 35U daily as decadron  tapers down.  1/23-decadron  1mg  every day for 3 days then off -sugars still quite high. Will bump lantus  to 38u daily. -continue same novolog  7u with meals  -01/21/25 CBGs doing well, cont regimen  1/26- BG's running 93-155- will monitor closely today since feeling ill/nauseated- don't want her dropping too low  1/27- BG's 140s-368- went up- x1- will check/monitor more closely.   1/28- 1/29looking good, but on liquid diet CBG (last 3)  Recent Labs    01/25/25 1719 01/25/25 1954 01/26/25 0545  GLUCAP 114* 80 104*    10.CKD IIIa: Stable, baseline range 1.1-1.5.  .   1/16- Cr 0.8 and BUN 30- is dry- will push fluids- and recheck Monday 1/19 BUN up to 38---push fluids, she should be able to take in plenty on her own.  1/22 labs reviewed and consistent--f/u Monday   1/26 Cr 0.98 and BUN 21- slightly dry- will ask pt to push fluids  1/27- will order CMP tomorrow  1/28- Cr and BUN looking better- con't to monitor- will keep her on IVF's today since not drinking a lot-   1/29- BUN 12 down from 19 and Cr 0.77- will stop IVFs this AM 11. Acute bronchitis: CT from 1/1 without acute intrathoracic pathology. Completed 10 day course of doxycycline  1/15.               -encourage ins spiro.   12. HLD: Lipitor  80 mg   13. HTN: Holding metoprolol , lisinopril , triamterene -HCTZ. Monitor bp per protocol for need to resume.    -1/22-24 bp controlled 1/26- Pt's BP slightly soft, will monitor esp in setting of being slightly dry 1/29- BP a little elevated- 150's to 164 systolic- likely due ot increased fluids form IVF's- will monitor,  because usually low Vitals:   01/22/25 2010 01/23/25 0434 01/23/25 0819 01/23/25 1345  BP: (!) 108/52 (!) 110/53 128/73 117/66   01/23/25 1930 01/24/25  9461 01/24/25 1305 01/24/25 1947  BP: (!) 104/54 118/71 (!) 116/52 124/60   01/25/25 0622 01/25/25 1507 01/25/25 1953 01/26/25 0544  BP: (!) 150/62 (!) 137/59 (!) 164/70 128/63    14. Neurogenic bladder: Foley removed on 1/14  monitor PVRs d/t potential for urinary retention.  1/19 PVR's low/absent. Occasional incontinence  -timed emptying for now q4 while awake  15. Neurogenic bowel: Continue Senna bid.  -Will give Sorbitol  45 cc x1 this afternoon- if no BM today, will give 60cc tomorrow and SSE. -1/16- had huge BM on toilet- but still feels like could go- will increase Senna to 2 tabs BID since increasing bowel meds- and keep on Sorbitol  prn if needed- d/c'd Bowel program for now since went on toilet- but might need to come back if starts having incontinence.  -01/14/25 LBM 1/15, pt stated no bowel program yesterday. Will restart suppository, continue bowel program, advised pt to use sorbitol  today before dinner as well to encourage better output tonight with program. Could see if she can d/c bowel program again later.-- 1/18 good output with bowel program so leave in place -1/23 had large liquid bm at 1300 yesterday   -will dc senna  -maintain PM bowel program  -01/21/25 liquid BM last night without bowel program-- previously constipated so hesitant to adjust meds, so pt will tell nursing staff to call us  if persistent loose stools. Leave meds as is for now.  1/26- will make suppository prn for now, but checking KUB to  make sure not backed up and only loose stool getting through, vs ileus?- \ 1/29- will order Mg citrate 1/2 bottle at 1pm and 1/2 bottle at 5pm- with nightly supp at 6pm 16. Nausea and reflux--improved  -added protonix  daily--increased to 40mg  bid 1/22 with improvement  -prn tums, dc'ed maalox  1/26- added Maalox back prn  just in case.  17. Questionable partial small bowel obstruction  1/27- ordered another KUB because want to make sure not getting worse- concerned,because pt feeling worse- ordered Phenergan - d/w nursing to get done ASAP- and pt on liquid diet now- decided due to Sx's being significantly worse, will place NGT via Fluoro today and hopefully she will feel better.   1/28- removed NGT- since no output- is very constipated- 1 medium and 1 small BM- will do Sorbitol  60cc again and another SMOG enema to get stool that's moved down- maintain IVF's for 1 more day- - let nurse know can  do crackers but otherwise liquid diet.   1/29- changed back to regular diet by NP- still very constipated- had 1 large, 1 small BM- explained it's likely high up, so will give Mg citrate 1 full bottle over 2 times- and suppository at 6pm- since enema just came right back out  18. 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  19. Hypokalemia  1/29- will replete  I spent a total of 53   minutes on total care today- >50% coordination of care- due to  D/w pt x2 when husband came in and wanted to hear about plan-  also repleted K+.  40 Meq x1-  pt seen twice and d/w nursing x3.  Reviewed labs, vitals and B/B  LOS: 14 days A FACE TO FACE EVALUATION WAS PERFORMED  Tawsha Terrero 01/26/2025, 10:21 AM     "

## 2025-01-27 ENCOUNTER — Inpatient Hospital Stay (HOSPITAL_COMMUNITY)

## 2025-01-27 LAB — GLUCOSE, CAPILLARY
Glucose-Capillary: 99 mg/dL (ref 70–99)
Glucose-Capillary: 167 mg/dL — ABNORMAL HIGH (ref 70–99)
Glucose-Capillary: 160 mg/dL — ABNORMAL HIGH (ref 70–99)
Glucose-Capillary: 119 mg/dL — ABNORMAL HIGH (ref 70–99)

## 2025-01-27 MED ORDER — LORAZEPAM 0.5 MG PO TABS
1.0000 mg | ORAL_TABLET | ORAL | Status: AC
  Administered 2025-01-27: 1 mg via ORAL
  Filled 2025-01-27: qty 2

## 2025-01-27 MED ORDER — LACTULOSE 10 GM/15ML PO SOLN: 30.0000 g | Freq: Every day | ORAL | Status: AC | PRN

## 2025-01-27 MED ORDER — SIMETHICONE 80 MG PO CHEW
80.0000 mg | CHEWABLE_TABLET | Freq: Four times a day (QID) | ORAL | Status: AC
  Administered 2025-01-27 – 2025-01-31 (×4): 80 mg via ORAL
  Filled 2025-01-27 (×21): qty 1

## 2025-01-27 NOTE — Progress Notes (Signed)
 Physical Therapy Session Note  Patient Details  Name: April Warren MRN: [id number] Date of Birth: 06-04-1971  Today's Date: 01/27/2025 PT Individual Time: 1000-1108 and 1345-1440 PT Individual Time Calculation (min): 68 min and 55 min  Short Term Goals: Week 2:  PT Short Term Goal 1 (Week 2): Pt will perform bed mobility with CGA or better PT Short Term Goal 2 (Week 2): Pt will ambulate 150 ft without w/c follow PT Short Term Goal 3 (Week 2): Pt will initiate stair training for strength and stabiltiy  Skilled Therapeutic Interventions/Progress Updates: Pt presented in w/c agreeable to therapy. Pt states not a good morning, feels heavy/weak and required moe assistance this am. Pt transported to day room and set up with 2.5lb ankle weights. Pt was able to complete 2 x 10 with RLE however was unable to achieve full extension with LLE which pt WAS able to complete yesterday. Pt demonstrated similar deficit when performing hip flexion with 2.5lb weights. Pt was able to complete Sit to stand with CGA from w/c and noted no knee instability in standing. Pt agreeable to try ambulation this session. Pt ambulated ~58ft x 2 with CGA and w/c follow for safety. Pt with increased knee instability on second bout but no overt LOB. Pt sat at mat and indicated feeling flush again as she did in previous session. After a moment improved but still present. Completed stand step transfer to w/c with CGA and transported back to room. BP checked WNL. Discussed with pt continued physiological changes that have occurred since surgery and body working to rebuild. Provided encouragement to pt that progression sometimes waxes and wanes. Therapeutic use of self to identify improvements pt has made since starting CIR. Pt expressed appreciation and understanding. Pt remained in w/c at end of session with call bell within reach and needs met.   Tx2: Pt presented in w/c agreeable to therapy. Pt continues to state feeling flush and  also increased tingling in extremities. BP checked 113/70 (83) HR 83, pt also indicating superior aspect of incision feeling like stabbing and burning, noted increased redness as compared to rest of incition. Notified via secure chat  to MD/NP. Pt feeling particularly depressed/frustrated at this time therefore PTA took pt off unit for environmental change. In [facility] discussed what positive outcomes pt has had so far. Also expressed that will continue to improve it may just require additional time. Pt was able to perform LAQ and hip flexion ROM on BLE with pt able to achieve full extension in LLE at start but with fatigue noted significant effort. Pt noted to have some improved affect after change in environment. Pt transported back to room and completed stand step transfer to bed. Pt required minA for sit to supine due to truncal support and BLE management. Pt also required minA to reposition to comfort. Pt left in bed at end of session with call bell within reach and needs met.      Therapy Documentation Precautions:  Precautions Precautions: Fall Recall of Precautions/Restrictions: Intact Precaution/Restrictions Comments: cervical Restrictions Weight Bearing Restrictions Per Provider Order: No  Therapy/Group: Individual Therapy  Ashari Llewellyn 01/27/2025, 3:29 PM

## 2025-01-27 NOTE — Telephone Encounter (Signed)
 Ok to keep scheduled hospital follow up as is with Dr. Zaida on 2/23. Plan is to get PET CT scan ordered at that visit.  Thanks, JD

## 2025-01-27 NOTE — Progress Notes (Signed)
 "                                                        PROGRESS NOTE   Subjective/Complaints:  Had a small loose stool/mushy yesterday AM, but no results after Mg citrate.   Not even with suppository.  But no more nausea- hasn't take nausea meds since Wed night- and doesn't feel constipated anymore.  Does still have gas.    Less abd pain, but abd still does feel somewhat less tight, but not soft.   Did require pain meds last night- took 10 mg overnight.   Had eggs and bacon and muffin this AM.    Also called that pt was having BP 174 systolic- and hot and felt badly- sounds like having Autonomic dysreflexia- - she voided- bladder scan ~ 120's at max; took off tighter clothes and asked them to give pain meds if needed- her BP came down to 117 systolic and feeling much better   ROS: per HPI   Pt denies SOB, abd pain, CP, N/V/C/D, and vision changes   Objective:   No results found.    Recent Labs    01/25/25 0445 01/26/25 0517  WBC 8.3 9.5  HGB 10.5* 9.7*  HCT 31.5* 28.7*  PLT 177 180   Recent Labs    01/25/25 0445 01/26/25 0517  NA 135 137  K 4.2 3.3*  CL 101 103  CO2 26 24  GLUCOSE 147* 94  BUN 19 12  CREATININE 0.87 0.77  CALCIUM  8.4* 8.0*   No intake or output data in the 24 hours ending 01/27/25 1017       Physical Exam: Vital Signs Blood pressure 117/61, pulse 69, temperature 98 F (36.7 C), temperature source Oral, resp. rate 17, height 5' 9 (1.753 m), weight 115.2 kg, last menstrual period 10/28/2014, SpO2 99%.         General: awake, alert, appropriate, sitting up in bed; NAD HENT: conjugate gaze; oropharynx moist CV: regular rate and rhythm; no JVD Pulmonary: CTA B/L; no W/R/R- good air movement GI: less tight- not quite soft, but less tight- still appears distended vs protuberant; NT today; more normoactive.  Psychiatric: appropriate- brighter affect;  Neurological: Ox3    Skin: posterior neck incision with surgical dressing  c/d/I as above     PRIOR EXAMS:  Musculoskeletal:  Right shoulder still limited but improving IR/ER. 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 B/L Brisk Hoffman's B/L in Ue's DTR's reduced to 2+ in Ue's and LE's. Stable appearance 1/23  Assessment/Plan: 1. Functional deficits which require 3+ hours per day of interdisciplinary therapy in a comprehensive inpatient rehab setting. Physiatrist is providing close team supervision and 24 hour management of active medical problems listed below. Physiatrist and rehab team continue to assess barriers to discharge/monitor patient progress toward functional and medical goals  Care Tool:  Bathing    Body parts bathed by patient: Right arm, Left arm, Chest, Right upper leg, Left upper leg, Face   Body parts bathed by helper: Abdomen, Front perineal area, Buttocks, Right lower leg, Left lower leg     Bathing assist Assist Level: Maximal Assistance - Patient 24 - 49%     Upper Body Dressing/Undressing Upper body dressing   What is the patient wearing?: Hospital gown only    Upper body assist Assist Level: Minimal Assistance - Patient > 75%    Lower Body Dressing/Undressing Lower body dressing      What is the patient wearing?: Pants, Incontinence brief     Lower body assist Assist for lower body dressing: Contact Guard/Touching assist     Toileting Toileting    Toileting assist Assist for toileting: Supervision/Verbal cueing     Transfers Chair/bed transfer  Transfers assist     Chair/bed transfer assist level: Contact  Guard/Touching assist     Locomotion Ambulation   Ambulation assist   Ambulation activity did not occur: Safety/medical concerns  Assist level: Contact Guard/Touching assist Assistive device: Walker-rolling Max distance: 130'   Walk 10 feet activity   Assist  Walk 10 feet activity did not occur: Safety/medical concerns  Assist level: Contact Guard/Touching assist Assistive device: Walker-rolling   Walk 50 feet activity   Assist Walk 50 feet with 2 turns activity did not occur: Safety/medical concerns  Assist level: Contact Guard/Touching assist Assistive device: Walker-rolling    Walk 150 feet activity   Assist Walk 150 feet activity did not occur: Safety/medical concerns         Walk 10 feet on uneven surface  activity   Assist Walk 10 feet on uneven surfaces activity did not occur: Safety/medical concerns         Wheelchair     Assist Is the patient using a wheelchair?: Yes Type of Wheelchair: Manual    Wheelchair assist level: Supervision/Verbal cueing Max wheelchair distance: 130'    Wheelchair 50 feet with 2 turns activity    Assist        Assist Level: Supervision/Verbal cueing   Wheelchair 150 feet activity     Assist      Assist Level: Dependent - Patient 0%   Blood pressure 117/61, pulse 69, temperature 98 F (36.7 C), temperature source Oral, resp. rate 17, height 5' 9 (1.753 m), weight 115.2 kg, last menstrual period 10/28/2014, SpO2 99%.  Medical Problem List and Plan: 1. 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 Supervision to min A with RW      Con't CIR PT and OT 2.  Antithrombotics: -DVT/anticoagulation:  Mechanical:  Antiembolism stockings, knee (TED hose) Bilateral lower extremities-- 01/14/25 DVT study neg -antiplatelet therapy:             -1/23 begin daily lovenox  40mg  daily    1/29- will need 1-2 months after d/c.  3. Pain Management: Baclofen  5 mg BID. Oxycodone  and robaxin  PRN.  -1/16- will increase Oxy to 10-15 mg q3 hours prn- since pain only went to 8/10 with pain meds- usually 9 or higher when meds wear off. -improved 1/17 -1/20 pt appears to have adhesive capsulitis of the right shoulder with referred pain. I suspect when she was working with therapy yesterday that she met resistance and pushed past it where she felt  grinding or popping as well as pain. We discussed the importance of improving her ROM and at least maintaining it.   1/23 lidocaine  patch helpful for scapular pain   -pt continues to work on right shoulder ROM   1/26- patient pain is controlled- not bad, even with therapy.   1/29- hasn't taken opiates since Sunday  1/30- took pain meds last night 4. Mood/Behavior/Sleep: LCSW to follow for evaluation and support when available.              -antipsychotic agents: Ativan  0.5 mg bid prn   5. Neuropsych/cognition: This patient is capable of making decisions on her own behalf.  6. Skin/Wound Care: Routine pressure relief measures. Monitor incision sites for signs of infection. Hemo Vac removed 1/15.  1/23 --postop dressing soiled, loosening. New dry dressing place. Small open area at the superior aspect of the wound  7. Fluids/Electrolytes/Nutrition: Monitor I&O and weight. Follow up labs CBC/CMP                -Carb modified diet with supplements to continue.   8. Cervical spinal stenosis: s/p laminectomy by Dr. Joshua 1/12. NSGRY following, continue on decadron  taper.   9. DM2: A1c 7.1. Holding home metformin , glipizide  and januvia .   -Hyperglycemia in the setting of steroids.  SSI. Novolog , Lantus  30 units daily. Novolog  7U with meals scheduled as well.  1/19 CBGs still pretty high although better this am.   increased Lantus  to 35U daily as decadron  tapers down.  1/23-decadron  1mg  every day for 3 days then off -sugars still quite high. Will  bump lantus  to 38u daily. -continue same novolog  7u with meals  -01/21/25 CBGs doing well, cont regimen  1/26- BG's running 93-155- will monitor closely today since feeling ill/nauseated- don't want her dropping too low  1/27- BG's 140s-368- went up- x1- will check/monitor more closely.   1/30- back to regular diet- a little more labile, up to 197- con't to monitor/regimen CBG (last 3)  Recent Labs    01/26/25 1656 01/26/25 2041 01/27/25 0530  GLUCAP 197* 110* 99    10.CKD IIIa: Stable, baseline range 1.1-1.5.  .   1/16- Cr 0.8 and BUN 30- is dry- will push fluids- and recheck Monday 1/19 BUN up to 38---push fluids, she should be able to take in plenty on her own.  1/22 labs reviewed and consistent--f/u Monday   1/26 Cr 0.98 and BUN 21- slightly dry- will ask pt to push fluids  1/27- will order CMP tomorrow  1/28- Cr and BUN looking better- con't to monitor- will keep her on IVF's today since not drinking a lot-   1/29- BUN 12 down from 19 and Cr 0.77- will stop IVFs this AM 11. Acute bronchitis: CT from 1/1 without acute intrathoracic pathology. Completed 10 day course of doxycycline  1/15.               -encourage ins spiro.   12. HLD: Lipitor  80 mg   13. HTN: Holding metoprolol , lisinopril , triamterene -HCTZ. Monitor bp per protocol for need to resume.    -1/22-24 bp controlled 1/26- Pt's BP slightly soft, will monitor esp in setting of being slightly dry 1/29- BP a little elevated- 150's to 164 systolic- likely due ot increased fluids form IVF's- will monitor, because usually low 130- usually running 110-s 120's except AD episode today-  Vitals:   01/23/25 1345 01/23/25 1930 01/24/25 0538 01/24/25 1305  BP: 117/66 (!) 104/54 118/71 (!) 116/52   01/24/25 1947 01/25/25 9377  01/25/25 1507 01/25/25 1953  BP: 124/60 (!) 150/62 (!) 137/59 (!) 164/70   01/26/25 0544 01/26/25 1304 01/26/25 2005 01/27/25 0529  BP: 128/63 121/66 126/67 117/61    14. Neurogenic bladder: Foley removed on  1/14  monitor PVRs d/t potential for urinary retention.  1/19 PVR's low/absent. Occasional incontinence  -timed emptying for now q4 while awake  15. Neurogenic bowel: Continue Senna bid.  -Will give Sorbitol  45 cc x1 this afternoon- if no BM today, will give 60cc tomorrow and SSE. -1/16- had huge BM on toilet- but still feels like could go- will increase Senna to 2 tabs BID since increasing bowel meds- and keep on Sorbitol  prn if needed- d/c'd Bowel program for now since went on toilet- but might need to come back if starts having incontinence.  -01/14/25 LBM 1/15, pt stated no bowel program yesterday. Will restart suppository, continue bowel program, advised pt to use sorbitol  today before dinner as well to encourage better output tonight with program. Could see if she can d/c bowel program again later.-- 1/18 good output with bowel program so leave in place -1/23 had large liquid bm at 1300 yesterday   -will dc senna  -maintain PM bowel program  -01/21/25 liquid BM last night without bowel program-- previously constipated so hesitant to adjust meds, so pt will tell nursing staff to call us  if persistent loose stools. Leave meds as is for now.  1/26- will make suppository prn for now, but checking KUB to  make sure not backed up and only loose stool getting through, vs ileus?- \ 1/29- will order Mg citrate 1/2 bottle at 1pm and 1/2 bottle at 5pm- with nightly supp at 6pm 16. Nausea and reflux--improved  -added protonix  daily--increased to 40mg  bid 1/22 with improvement  -prn tums, dc'ed maalox  1/26- added Maalox back prn just in case.  17. Questionable partial small bowel obstruction  1/27- ordered another KUB because want to make sure not getting worse- concerned,because pt feeling worse- ordered Phenergan - d/w nursing to get done ASAP- and pt on liquid diet now- decided due to Sx's being significantly worse, will place NGT via Fluoro today and hopefully she will feel better.   1/28- removed  NGT- since no output- is very constipated- 1 medium and 1 small BM- will do Sorbitol  60cc again and another SMOG enema to get stool that's moved down- maintain IVF's for 1 more day- - let nurse know can  do crackers but otherwise liquid diet.   1/29- changed back to regular diet by NP- still very constipated- had 1 large, 1 small BM- explained it's likely high up, so will give Mg citrate 1 full bottle over 2 times- and suppository at 6pm- since enema just came right back out   1/30- only 1 small BM yesterday- feeling much better- will add Lactulose  20cc prn daily- to take if pt needs it- d/w pt- also added Gas-x QID 80 mg 18. 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  19. Hypokalemia  1/29- will replete 40 mEq x1 and recheck Monday 19. Autonomic dysreflexia (AD)  1/30- pt had episode of AD today with BP up to 174 systolic- came down with pain meds; cooler room and voiding (bladder scan 123cc afterwards) and loosening clothing- down to 117 systolic- will educate pt more on AD   I spent a total of  59  minutes on total care today- >50% coordination of care- due to  Advising team about AD and how ot handle since voiding- also added gas-x QID and lactulose  prn- d/w PT/OT and nursing about AD episode   LOS: 15 days A FACE TO FACE EVALUATION WAS PERFORMED  April Warren 01/27/2025, 10:17 AM     "

## 2025-01-27 NOTE — Progress Notes (Signed)
 Recreational Therapy Session Note  Patient Details  Name: April Warren MRN: [id number] Date of Birth: 1971/12/25 Today's Date: 01/27/2025 Goal:  pt will maintain dynamic standing balance for moderate complex leisure task with min assist.  MET Pain: c/o heaviness in neck and shoulders today, pt premedicated prior to session Skilled Therapeutic Interventions/Progress Updates: Pt reports feeling heaviness in neck and shoulders and tingling in arms, hands, legs today but agreeable to therapy.  Session focused on dynamic standing balance during co-treat with PT.  Simulated leisure activities she may participate in with her kids.  Pt stood with RW to kick a ball alternating LEs with CGA.  Pt reported feeling hot and needing a seated rest break.  Cold wet washcloth given & provided some relief.  After seated rest break, transitioned to standing with RW to stop a rolled ball and then kick it back to LRT/CTRS alternating LEs with CGA.  After another seated rest break, pt stood without UE support for ball toss activity needing CGA-Min assist for balance.  Pt continued with c/o of feeling hot and sweaty, and heaviness in neck & shoulders.  Discussed bowel and bladder and BP assessed -BP elevated.  See PT documentation for readings.  Ended session early & returned to the room by PT & nursing to further assess pt complaints.  Therapy/Group: Co-Treatment  Urvi Imes 01/27/2025, 11:46 AM

## 2025-01-27 NOTE — Progress Notes (Signed)
 Physical Therapy Weekly Progress Note  Patient Details  Name: April Warren MRN: [id number] Date of Birth: [birth year]  Beginning of progress report period: January 20, 2025 End of progress report period: January 27, 2025  Today's Date: 01/27/2025 PT Individual Time: 0900-1000 PT Individual Time Calculation (min): 60 min   Patient has met 2 of 3 short term goals.  Pt is progressing well, she is able to ambulate >100 ft with CGA, using w/c follow for safety. Note large improvements in knee instability.   Patient continues to demonstrate the following deficits muscle weakness and muscle joint tightness, abnormal tone, unbalanced muscle activation, and decreased coordination, and decreased standing balance, decreased postural control, and decreased balance strategies and therefore will continue to benefit from skilled PT intervention to increase functional independence with mobility.  Patient progressing toward long term goals..  Plan of care revisions: Upgraded goals to supervision to better reflect progress.  PT Short Term Goals Week 2:  PT Short Term Goal 1 (Week 2): Pt will perform bed mobility with CGA or better PT Short Term Goal 1 - Progress (Week 2): Met PT Short Term Goal 2 (Week 2): Pt will ambulate 150 ft without w/c follow PT Short Term Goal 2 - Progress (Week 2): Progressing toward goal PT Short Term Goal 3 (Week 2): Pt will initiate stair training for strength and stabiltiy PT Short Term Goal 3 - Progress (Week 2): Met Week 3:  PT Short Term Goal 1 (Week 3): Pt will navigate stairs with assist x 4 PT Short Term Goal 2 (Week 3): Pt will ambulate 150 ft without w/c follow PT Short Term Goal 3 (Week 3): Pt will perform STS with supervision intermittently  Skilled Therapeutic Interventions/Progress Updates:  Pt seated in w/c on arrival and agreeable to therapy. Reports pain controlled, but that she is feeling weaker today and has a sensation of heaviness in both arms. Note pt MMT  weaker in LLE than previous trials. Discussed non-linear progress and large improvements pt has made.   Pt seen with rec therapist for dynamic balance and return to parenting based activities. Pt performed variations of ball kicking and tossing with CGA. Pt able to maintain balance with CGA overall, note RLE able to maintain TKE without hyperextension, but hyperextension still noted in LLE.   At this time, pt reports feeling hot/flushed, did relieve with cool wash cloth. BP assessed and found as high 174/74 (90). Pt returned to room after CGA Stand pivot transfer to w/c. Nsg provided bladder scan. Addressed tight clothing and provided education on potential autonomic dysreflexia and signs to watch for. Eventually found BP returning to WNL, 117/71(85) at therapist exit. Pt left in care of nsg staff at end of session.   Therapy Documentation Precautions:  Precautions Precautions: Fall Recall of Precautions/Restrictions: Intact Precaution/Restrictions Comments: cervical Restrictions Weight Bearing Restrictions Per Provider Order: No General:     Therapy/Group: Individual Therapy  [name] 01/27/2025, 4:53 PM

## 2025-01-27 NOTE — Plan of Care (Signed)
" °  Problem: Consults Goal: RH SPINAL CORD INJURY PATIENT EDUCATION Description:  See Patient Education module for education specifics.  Outcome: Progressing   Problem: SCI BOWEL ELIMINATION Goal: RH STG MANAGE BOWEL WITH ASSISTANCE Description: STG Manage Bowel with bowel program min Assistance. Outcome: Progressing   Problem: SCI BLADDER ELIMINATION Goal: RH STG MANAGE BLADDER WITH ASSISTANCE Description: STG Manage Bladder With bladder program min Assistance Outcome: Progressing   Problem: RH SKIN INTEGRITY Goal: RH STG SKIN FREE OF INFECTION/BREAKDOWN Description: Manage skin free of infection with min assistance Outcome: Progressing   Problem: RH SAFETY Goal: RH STG ADHERE TO SAFETY PRECAUTIONS W/ASSISTANCE/DEVICE Description: STG Adhere to Safety Precautions With min Assistance/Device. Outcome: Progressing   Problem: RH PAIN MANAGEMENT Goal: RH STG PAIN MANAGED AT OR BELOW PT'S PAIN GOAL Description: <4 w/ prns Outcome: Progressing   Problem: RH KNOWLEDGE DEFICIT SCI Goal: RH STG INCREASE KNOWLEDGE OF SELF CARE AFTER SCI Description: Manage increase knowledge of self care with min assistance from spouse using educational materials provided Outcome: Progressing   "

## 2025-01-27 NOTE — Progress Notes (Shared)
 IP Rehab Bowel Program Documentation   Bowel Program Start time [id number]  Dig Stim Indicated? Yes  Dig Stim Prior to Suppository or mini Enema X 1   Output from dig stim: Moderate  Ordered intervention: Suppository Yes , mini enema No ,   Repeat dig stim after Suppository or Mini enema  X 2,  Output? {Desc; minimal/small/moderate/large/very large:110034}   Bowel Program Complete? {YES/NO:21197}, handoff given Yes  Patient Tolerated? {YES/NO:21197}

## 2025-01-27 NOTE — Progress Notes (Signed)
 Occupational Therapy Session Note  Patient Details  Name: April Warren MRN: [id number] Date of Birth: [birth year]  Today's Date: 01/27/2025 OT Individual Time: 1117-1202 OT Individual Time Calculation (min): 45 min    Short Term Goals: Week 2:  OT Short Term Goal 1 (Week 2): Patient to increase AROM into R shoulder flexion to 150 degrees OT Short Term Goal 2 (Week 2): patient will complete toileting 3/3 CGA OT Short Term Goal 3 (Week 2): patient will complete LB bathing UB/LB SUP A  Skilled Therapeutic Interventions/Progress Updates:  Skilled OT session completed to address [facility] in BUE. Pt received seated in WC, agreeable to participate in therapy. Pt reports no pain; however, numbness/tingling in BUE/BLE. OT provided rest breaks and repositioning throughout to address.   Pt dependently propelled to main gym. Pt completed large peg board activity with no difficulty noted; OT upgraded task to small peg board; pt displaying difficulty with tripod and tip pinch when retrieving pegs. OT prompted pt for intermittent rest breaks and to incorporate BUE, more challenges noted on LUE with in-hand manipulation. Pt completed in-hand manipulation activity targeting translation from finger to palm and finger to finger with increased time for finger to finger on BUE to increase independence with ADLs/iADLs. Pt completed folding activity folding 3/3 wash cloths and 2/2 towels, pt endorsing more difficulty with towels d/t BUE coordination. Pt adheres to cervical precautions throughout session with no VC. Pt returned to room seated in [facility] set-up A for meal to open containers with all needs in reach.  Therapy Documentation Precautions:  Precautions Precautions: Fall Recall of Precautions/Restrictions: Intact Precaution/Restrictions Comments: cervical Restrictions Weight Bearing Restrictions Per Provider Order: No   Therapy/Group: Individual Therapy  Dwight Adamczak [place], MS, OTR/L 01/27/2025, 8:00 AM

## 2025-01-28 LAB — GLUCOSE, CAPILLARY
Glucose-Capillary: 90 mg/dL (ref 70–99)
Glucose-Capillary: 88 mg/dL (ref 70–99)
Glucose-Capillary: 135 mg/dL — ABNORMAL HIGH (ref 70–99)
Glucose-Capillary: 117 mg/dL — ABNORMAL HIGH (ref 70–99)

## 2025-01-28 NOTE — Progress Notes (Signed)
 "                                                        PROGRESS NOTE   Subjective/Complaints:  Pt doing well today, slept well, pain well managed, LBM this morning and yesterday multiple times--cleaned out! Urinating fine. No other complaints or concerns. Feeling better from yesterday's AD event.    ROS: per HPI   Pt denies SOB, abd pain, CP, N/V/C/D, and vision changes   Objective:   MR CERVICAL SPINE WO CONTRAST Result Date: 01/28/2025 CLINICAL DATA:  Initial evaluation for spinal cord injury, new onset left lower extremity weakness. EXAM: MRI CERVICAL SPINE WITHOUT CONTRAST TECHNIQUE: Multiplanar, multisequence MR imaging of the cervical spine was performed. No intravenous contrast was administered. COMPARISON:  Comparison made with prior MRI from 01/04/2025. FINDINGS: Alignment: Straightening with slight reversal of the normal cervical lordosis. No significant listhesis. Vertebrae: Postoperative changes from recent wide posterior decompressive laminectomy, medial facetectomy, and fusion at C2-3 through C6-7. Large collection extending from the laminectomy defect into the posterior paraspinous soft tissues along the surgical incision is partially visualized. Image portions measure up to 6.6 x 8.1 x 7.2 cm. No significant surrounding inflammatory changes. Finding favored to reflect a large postoperative seroma. No visible dural defect to suggest CSF leak, although this is not entirely excluded. No significant sagging at the base of the brain to suggest CSF hypotension. Chronic compression deformities noted at the superior endplates of T1 and T2. Vertebral body height otherwise maintained without acute or recent fracture. Underlying bone marrow signal intensity overall within normal limits. No worrisome osseous lesions. No significant abnormal marrow edema. Cord: Normal signal and morphology. No convincing cord signal changes are seen on this mildly motion degraded exam. Previously questioned  signal abnormality at C3-4 and C4-5 is not convincingly seen. Posterior Fossa, vertebral arteries, paraspinal tissues: Visualized brain and posterior fossa within normal limits. Craniocervical junction within normal limits. Postoperative changes with fluid collection within the posterior paraspinous soft tissues as above. Normal flow voids seen within the vertebral arteries bilaterally. Disc levels: C2-C3: No significant disc bulge. Ossification of the posterior longitudinal ligament. Mild cord flattening without cord signal changes. Moderate spinal stenosis. Foramina remain patent. C3-C4: Interval posterior decompression and fusion. Disc bulge with uncovertebral spurring and OPLL. Residual mild spinal stenosis, improved. Mild left C4 foraminal narrowing. Right neural foramen appears patent. C4-C5: Prior posterior decompression with fusion. Disc bulge with uncovertebral spurring and OPLL. Residual mild to moderate spinal stenosis, improved. Mild to moderate bilateral C5 foraminal narrowing. C5-C6: Mild disc bulge with uncovertebral spurring and OPLL. Prior posterior decompression with fusion. No significant residual spinal stenosis. Mild-to-moderate right C6 foraminal narrowing. Left neural foramina remains patent. C6-C7: Small right paracentral disc protrusion. No significant spinal stenosis. Foramina appear patent. C7-T1: Minimal disc bulge. Mild facet hypertrophy. No significant spinal stenosis. Foramina appear patent. T1-2: Mild disc bulge.  No significant canal or foraminal stenosis. T2-3: Seen only on sagittal projection. Diffuse disc bulge with endplate spurring. Resultant mild spinal stenosis. Foramina appear patent. IMPRESSION: 1. Postoperative changes from recent posterior decompression and fusion at C2-C7. Large collection extending from the laminectomy defect into the posterior paraspinous soft tissues along the surgical incision, measuring up to 6.6 x 8.1 x 7.2 cm. Finding favored to reflect a large  postoperative seroma. No visible dural  defect to suggest CSF leak, although this is not entirely excluded. No significant sagging at the base of the brain to suggest CSF hypotension. No significant surrounding inflammatory changes to suggest abscess/infection. 2. No other acute abnormality within the cervical spine. Previously question cord signal changes are not convincingly seen on this mildly motion degraded exam. No other MRI evidence for acute cord injury. 3. 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 Residual mild to moderate spinal stenosis at C2-3 through C4-5, improved from prior. Electronically Signed   By: Morene Hoard M.D.   On: 01/28/2025 01:16      Recent Labs    01/26/25 0517  WBC 9.5  HGB 9.7*  HCT 28.7*  PLT 180   Recent Labs    01/26/25 0517  NA 137  K 3.3*  CL 103  CO2 24  GLUCOSE 94  BUN 12  CREATININE 0.77  CALCIUM  8.0*    Intake/Output Summary (Last 24 hours) at 01/28/2025 1158 Last data filed at 01/27/2025 1800 Gross per 24 hour  Intake 480 ml  Output --  Net 480 ml         Physical Exam: Vital Signs Blood pressure 137/67, pulse 68, temperature 97.9 F (36.6 C), temperature source Oral, resp. rate 18, height 5' 9 (1.753 m), weight 115.2 kg, last menstrual period 10/28/2014, SpO2 97%.    General: awake, alert, appropriate, resting comfortably in bed; NAD HENT: conjugate gaze; oropharynx moist CV: regular rate and rhythm; no JVD Pulmonary: CTA B/L; no W/R/R- good air movement GI: abd soft,  protuberant/obese but nondistended; NT today; +BS, more normoactive.  Psychiatric: appropriate- brighter affect;  Neurological: Ox3 Skin: posterior neck incision with surgical dressing c/d/I     PRIOR EXAMS:  Musculoskeletal:  Right shoulder still limited but improving IR/ER. 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 B/L Brisk Hoffman's B/L in Ue's DTR's reduced to 2+ in Ue's and LE's. Stable appearance 1/23  Assessment/Plan: 1. Functional deficits which require 3+ hours per day of interdisciplinary therapy in a comprehensive inpatient rehab setting. Physiatrist is providing close team supervision and 24 hour management of active medical problems listed below. Physiatrist and rehab team continue to assess barriers to discharge/monitor patient progress toward functional and medical goals  Care Tool:  Bathing    Body parts bathed by patient: Right arm, Left arm, Chest, Right upper leg, Left upper leg, Face   Body parts bathed by helper: Abdomen, Front perineal area, Buttocks, Right lower leg, Left lower leg     Bathing assist Assist Level: Maximal Assistance - Patient 24 - 49%     Upper Body Dressing/Undressing Upper body dressing   What is the patient wearing?: Hospital gown only    Upper body assist Assist Level: Minimal Assistance - Patient > 75%    Lower Body Dressing/Undressing Lower body dressing      What is the patient wearing?: Pants, Incontinence brief     Lower body assist Assist for lower body dressing: Contact Guard/Touching assist     Toileting Toileting  Toileting assist Assist for toileting: Supervision/Verbal cueing     Transfers Chair/bed transfer  Transfers assist     Chair/bed transfer assist level: Contact Guard/Touching assist     Locomotion Ambulation   Ambulation assist   Ambulation activity did not occur: Safety/medical concerns  Assist level: Contact Guard/Touching assist Assistive device: Walker-rolling Max distance: 130'   Walk 10 feet  activity   Assist  Walk 10 feet activity did not occur: Safety/medical concerns  Assist level: Contact Guard/Touching assist Assistive device: Walker-rolling   Walk 50 feet activity   Assist Walk 50 feet with 2 turns activity did not occur: Safety/medical concerns  Assist level: Contact Guard/Touching assist Assistive device: Walker-rolling    Walk 150 feet activity   Assist Walk 150 feet activity did not occur: Safety/medical concerns         Walk 10 feet on uneven surface  activity   Assist Walk 10 feet on uneven surfaces activity did not occur: Safety/medical concerns         Wheelchair     Assist Is the patient using a wheelchair?: Yes Type of Wheelchair: Manual    Wheelchair assist level: Supervision/Verbal cueing Max wheelchair distance: 130'    Wheelchair 50 feet with 2 turns activity    Assist        Assist Level: Supervision/Verbal cueing   Wheelchair 150 feet activity     Assist      Assist Level: Dependent - Patient 0%   Blood pressure 137/67, pulse 68, temperature 97.9 F (36.6 C), temperature source Oral, resp. rate 18, height 5' 9 (1.753 m), weight 115.2 kg, last menstrual period 10/28/2014, SpO2 97%.  Medical Problem List and Plan: 1. 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 Supervision to min A with RW      Con't CIR PT and OT  2.  Antithrombotics: -DVT/anticoagulation:  Mechanical:  Antiembolism stockings, knee (TED hose) Bilateral lower extremities-- 01/14/25 DVT study neg -antiplatelet therapy:             -1/23 begin daily lovenox  40mg  daily   1/29- will need 1-2 months after d/c.   3. Pain Management: Baclofen  5 mg BID. Oxycodone  and robaxin  PRN.  -1/16- will increase Oxy to 10-15 mg q3 hours prn- since pain only went to 8/10 with pain meds- usually 9 or higher when meds wear off. -improved  1/17 -1/20 pt appears to have adhesive capsulitis of the right shoulder with referred pain. I suspect when she was working with therapy yesterday that she met resistance and pushed past it where she felt grinding or popping as well as pain. We discussed the importance of improving her ROM and at least maintaining it.   1/23 lidocaine  patch helpful for scapular pain   -pt continues to work on right shoulder ROM   1/26- patient pain is controlled- not bad, even with therapy.   1/29- hasn't taken opiates since Sunday  1/30- took pain meds last night  4. Mood/Behavior/Sleep: LCSW to follow for evaluation and support when available.              -antipsychotic agents: Ativan  0.5 mg bid prn   5. Neuropsych/cognition: This patient is capable of making decisions on her own behalf.  6. Skin/Wound Care: Routine pressure relief measures. Monitor incision sites for signs of infection. Hemo Vac removed 1/15.  1/23 --postop dressing soiled, loosening. New dry dressing place. Small open area at the superior aspect of the wound -01/29/24 MRI C-spine last night without spinal cord findings, but does show seroma-- weekday team can reach out to NSG to see if they'd like to drain this area or let it reabsorb (nonurgent, no evidence of infection)  7. Fluids/Electrolytes/Nutrition: Monitor I&O and weight. Follow up labs CBC/CMP                -Carb modified diet with supplements to continue.   8. Cervical spinal stenosis: s/p laminectomy by Dr. Joshua 1/12. NSGRY following, continue on decadron  taper.   9. DM2: A1c 7.1. Holding home metformin , glipizide  and januvia .   -Hyperglycemia in the setting of steroids.  SSI. Novolog , Lantus  30 units daily. Novolog  7U with meals scheduled as well.  1/19 CBGs still pretty high although better this am.   increased Lantus  to 35U daily as decadron  tapers down.  1/23-decadron  1mg  every day for 3 days then off -sugars still quite high. Will bump lantus  to 38u daily. -continue  same novolog  7u with meals  -01/21/25 CBGs doing well, cont regimen 1/26- BG's running 93-155- will monitor closely today since feeling ill/nauseated- don't want her dropping too low  1/27- BG's 140s-368- went up- x1- will check/monitor more closely.  1/30- back to regular diet- a little more labile, up to 197- con't to monitor/regimen -01/28/25 CBGs doing much better in last 24hrs; monitor CBG (last 3)  Recent Labs    01/27/25 1626 01/27/25 2144 01/28/25 0545  GLUCAP 160* 119* 90    10.CKD IIIa: Stable, baseline range 1.1-1.5.  .   1/16- Cr 0.8 and BUN 30- is dry- will push fluids- and recheck Monday 1/19 BUN up to 38---push fluids, she should be able to take in plenty on her own.  1/22 labs reviewed and consistent--f/u Monday   1/26 Cr 0.98 and BUN 21- slightly dry- will ask pt to push fluids  1/27- will order CMP tomorrow 1/28- Cr and BUN looking better- con't to monitor- will keep her on IVF's today since not drinking a lot-   1/29- BUN 12 down from 19 and Cr 0.77- will stop IVFs this AM  11. Acute bronchitis: CT from 1/1 without acute intrathoracic pathology. Completed 10 day course of doxycycline  1/15.               -encourage ins spiro.   12. HLD: Lipitor  80 mg   13. HTN: Holding metoprolol , lisinopril , triamterene -HCTZ. Monitor bp per protocol for need to resume.    -1/22-24 bp controlled 1/26- Pt's BP slightly soft, will monitor esp in setting of being slightly dry 1/29- BP a little elevated- 150's to 164 systolic- likely due ot increased fluids form IVF's- will monitor, because usually low 1/30- usually running 110-s 120's except AD episode today-  -01/28/25 BPs better, no further AD noted Vitals:   01/24/25 1305 01/24/25 1947 01/25/25 0622 01/25/25 1507  BP: (!) 116/52 124/60 (!) 150/62 (!) 137/59   01/25/25 1953 01/26/25 0544 01/26/25 1304 01/26/25 2005  BP: (!) 164/70 128/63 121/66 126/67   01/27/25 0529 01/27/25 1242 01/27/25 2019 01/28/25 0500  BP: 117/61 106/76  (!) 111/48 137/67    14. Neurogenic bladder: Foley removed on 1/14  monitor PVRs d/t potential for urinary retention.  1/19 PVR's low/absent. Occasional incontinence  -timed emptying for now q4 while awake  15. Neurogenic bowel: Continue Senna bid.  -Will give Sorbitol  45 cc x1 this afternoon- if no  BM today, will give 60cc tomorrow and SSE. -1/16- had huge BM on toilet- but still feels like could go- will increase Senna to 2 tabs BID since increasing bowel meds- and keep on Sorbitol  prn if needed- d/c'd Bowel program for now since went on toilet- but might need to come back if starts having incontinence.  -01/14/25 LBM 1/15, pt stated no bowel program yesterday. Will restart suppository, continue bowel program, advised pt to use sorbitol  today before dinner as well to encourage better output tonight with program. Could see if she can d/c bowel program again later.-- 1/18 good output with bowel program so leave in place -1/23 had large liquid bm at 1300 yesterday   -will dc senna  -maintain PM bowel program  -01/21/25 liquid BM last night without bowel program-- previously constipated so hesitant to adjust meds, so pt will tell nursing staff to call us  if persistent loose stools. Leave meds as is for now.  1/26- will make suppository prn for now, but checking KUB to  make sure not backed up and only loose stool getting through, vs ileus?- \ 1/29- will order Mg citrate 1/2 bottle at 1pm and 1/2 bottle at 5pm- with nightly supp at 6pm -01/28/25 emptied out last night/this AM, cont regimen as is for now  16. Nausea and reflux--improved  -added protonix  daily--increased to 40mg  bid 1/22 with improvement  -prn tums, dc'ed maalox  1/26- added Maalox back prn just in case.   17. Questionable partial small bowel obstruction 1/27- ordered another KUB because want to make sure not getting worse- concerned,because pt feeling worse- ordered Phenergan - d/w nursing to get done ASAP- and pt on liquid diet  now- decided due to Sx's being significantly worse, will place NGT via Fluoro today and hopefully she will feel better.  1/28- removed NGT- since no output- is very constipated- 1 medium and 1 small BM- will do Sorbitol  60cc again and another SMOG enema to get stool that's moved down- maintain IVF's for 1 more day- - let nurse know can  do crackers but otherwise liquid diet.  1/29- changed back to regular diet by NP- still very constipated- had 1 large, 1 small BM- explained it's likely high up, so will give Mg citrate 1 full bottle over 2 times- and suppository at 6pm- since enema just came right back out  1/30- only 1 small BM yesterday- feeling much better- will add Lactulose  20cc prn daily- to take if pt needs it- d/w pt- also added Gas-x QID 80 mg -01/28/25 see #15, LBM this morning  18. 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   19. 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 01/28/2025, 11:58 AM     "

## 2025-01-29 LAB — GLUCOSE, CAPILLARY
Glucose-Capillary: 98 mg/dL (ref 70–99)
Glucose-Capillary: 91 mg/dL (ref 70–99)
Glucose-Capillary: 67 mg/dL — ABNORMAL LOW (ref 70–99)
Glucose-Capillary: 95 mg/dL (ref 70–99)
Glucose-Capillary: 183 mg/dL — ABNORMAL HIGH (ref 70–99)

## 2025-01-29 NOTE — Progress Notes (Signed)
 "                                                        PROGRESS NOTE   Subjective/Complaints:  Pt doing well again today, slept well, pain well managed, LBM yesterday. Urinating fine. Now mentions that her left lower leg and kind of all her extremities are weaker than they had been, she hadn't mentioned it yesterday but states it's ongoing.  No other complaints or concerns.   ROS: per HPI   Pt denies SOB, abd pain, CP, N/V/C/D, and vision changes   Objective:   MR CERVICAL SPINE WO CONTRAST Result Date: 01/28/2025 CLINICAL DATA:  Initial evaluation for spinal cord injury, new onset left lower extremity weakness. EXAM: MRI CERVICAL SPINE WITHOUT CONTRAST TECHNIQUE: Multiplanar, multisequence MR imaging of the cervical spine was performed. No intravenous contrast was administered. COMPARISON:  Comparison made with prior MRI from 01/04/2025. FINDINGS: Alignment: Straightening with slight reversal of the normal cervical lordosis. No significant listhesis. Vertebrae: Postoperative changes from recent wide posterior decompressive laminectomy, medial facetectomy, and fusion at C2-3 through C6-7. Large collection extending from the laminectomy defect into the posterior paraspinous soft tissues along the surgical incision is partially visualized. Image portions measure up to 6.6 x 8.1 x 7.2 cm. No significant surrounding inflammatory changes. Finding favored to reflect a large postoperative seroma. No visible dural defect to suggest CSF leak, although this is not entirely excluded. No significant sagging at the base of the brain to suggest CSF hypotension. Chronic compression deformities noted at the superior endplates of T1 and T2. Vertebral body height otherwise maintained without acute or recent fracture. Underlying bone marrow signal intensity overall within normal limits. No worrisome osseous lesions. No significant abnormal marrow edema. Cord: Normal signal and morphology. No convincing cord  signal changes are seen on this mildly motion degraded exam. Previously questioned signal abnormality at C3-4 and C4-5 is not convincingly seen. Posterior Fossa, vertebral arteries, paraspinal tissues: Visualized brain and posterior fossa within normal limits. Craniocervical junction within normal limits. Postoperative changes with fluid collection within the posterior paraspinous soft tissues as above. Normal flow voids seen within the vertebral arteries bilaterally. Disc levels: C2-C3: No significant disc bulge. Ossification of the posterior longitudinal ligament. Mild cord flattening without cord signal changes. Moderate spinal stenosis. Foramina remain patent. C3-C4: Interval posterior decompression and fusion. Disc bulge with uncovertebral spurring and OPLL. Residual mild spinal stenosis, improved. Mild left C4 foraminal narrowing. Right neural foramen appears patent. C4-C5: Prior posterior decompression with fusion. Disc bulge with uncovertebral spurring and OPLL. Residual mild to moderate spinal stenosis, improved. Mild to moderate bilateral C5 foraminal narrowing. C5-C6: Mild disc bulge with uncovertebral spurring and OPLL. Prior posterior decompression with fusion. No significant residual spinal stenosis. Mild-to-moderate right C6 foraminal narrowing. Left neural foramina remains patent. C6-C7: Small right paracentral disc protrusion. No significant spinal stenosis. Foramina appear patent. C7-T1: Minimal disc bulge. Mild facet hypertrophy. No significant spinal stenosis. Foramina appear patent. T1-2: Mild disc bulge.  No significant canal or foraminal stenosis. T2-3: Seen only on sagittal projection. Diffuse disc bulge with endplate spurring. Resultant mild spinal stenosis. Foramina appear patent. IMPRESSION: 1. Postoperative changes from recent posterior decompression and fusion at C2-C7. Large collection extending from the laminectomy defect into the posterior paraspinous soft tissues along the surgical  incision, measuring up to  6.6 x 8.1 x 7.2 cm. Finding favored to reflect a large postoperative seroma. No visible dural defect to suggest CSF leak, although this is not entirely excluded. No significant sagging at the base of the brain to suggest CSF hypotension. No significant surrounding inflammatory changes to suggest abscess/infection. 2. No other acute abnormality within the cervical spine. Previously question cord signal changes are not convincingly seen on this mildly motion degraded exam. No other MRI evidence for acute cord injury. 3. 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 Residual mild to moderate spinal stenosis at C2-3 through C4-5, improved from prior. Electronically Signed   By: Morene Hoard M.D.   On: 01/28/2025 01:16      No results for input(s): WBC, HGB, HCT, PLT in the last 72 hours.  No results for input(s): NA, K, CL, CO2, GLUCOSE, BUN, CREATININE, CALCIUM  in the last 72 hours.   Intake/Output Summary (Last 24 hours) at 01/29/2025 0709 Last data filed at 01/28/2025 1357 Gross per 24 hour  Intake 354 ml  Output --  Net 354 ml         Physical Exam: Vital Signs Blood pressure 128/60, pulse (!) 57, temperature 98.6 F (37 C), temperature source Oral, resp. rate 18, height 5' 9 (1.753 m), weight 115.2 kg, last menstrual period 10/28/2014, SpO2 96%.    General: awake, alert, appropriate, resting comfortably in bed; NAD HENT: conjugate gaze; oropharynx moist CV: regular rate and rhythm; no JVD Pulmonary: CTA B/L; no W/R/R- good air movement GI: abd soft,  protuberant/obese but not frankly distended; NT today; +BS, more normoactive.  Psychiatric: appropriate- brighter affect;  Neurological: Ox3 Skin: posterior neck incision with surgical dressing c/d/I--not reassessed this morning Extremities: moving all extremities    PRIOR EXAMS:  Musculoskeletal:  Right shoulder still limited but improving IR/ER.  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 B/L Brisk Hoffman's B/L in Ue's DTR's reduced to 2+ in Ue's and LE's. Stable appearance 1/23  Assessment/Plan: 1. Functional deficits which require 3+ hours per day of interdisciplinary therapy in a comprehensive inpatient rehab setting. Physiatrist is providing close team supervision and 24 hour management of active medical problems listed below. Physiatrist and rehab team continue to assess barriers to discharge/monitor patient progress toward functional and medical goals  Care Tool:  Bathing    Body parts bathed by patient: Right arm, Left arm, Chest, Right upper leg, Left upper leg, Face   Body parts bathed by helper: Abdomen, Front perineal area, Buttocks, Right lower leg, Left lower leg     Bathing assist Assist Level: Maximal Assistance - Patient 24 - 49%     Upper Body Dressing/Undressing Upper body dressing   What is the patient wearing?: Hospital gown only    Upper body assist Assist Level: Minimal Assistance - Patient > 75%    Lower Body Dressing/Undressing Lower body dressing      What is the patient wearing?: Pants, Incontinence brief     Lower body assist Assist for lower body dressing: Contact Guard/Touching assist  Toileting Toileting    Toileting assist Assist for toileting: Supervision/Verbal cueing     Transfers Chair/bed transfer  Transfers assist     Chair/bed transfer assist level: Contact Guard/Touching assist     Locomotion Ambulation   Ambulation assist   Ambulation activity did not occur:  Safety/medical concerns  Assist level: Contact Guard/Touching assist Assistive device: Walker-rolling Max distance: 130'   Walk 10 feet activity   Assist  Walk 10 feet activity did not occur: Safety/medical concerns  Assist level: Contact Guard/Touching assist Assistive device: Walker-rolling   Walk 50 feet activity   Assist Walk 50 feet with 2 turns activity did not occur: Safety/medical concerns  Assist level: Contact Guard/Touching assist Assistive device: Walker-rolling    Walk 150 feet activity   Assist Walk 150 feet activity did not occur: Safety/medical concerns         Walk 10 feet on uneven surface  activity   Assist Walk 10 feet on uneven surfaces activity did not occur: Safety/medical concerns         Wheelchair     Assist Is the patient using a wheelchair?: Yes Type of Wheelchair: Manual    Wheelchair assist level: Supervision/Verbal cueing Max wheelchair distance: 130'    Wheelchair 50 feet with 2 turns activity    Assist        Assist Level: Supervision/Verbal cueing   Wheelchair 150 feet activity     Assist      Assist Level: Dependent - Patient 0%   Blood pressure 128/60, pulse (!) 57, temperature 98.6 F (37 C), temperature source Oral, resp. rate 18, height 5' 9 (1.753 m), weight 115.2 kg, last menstrual period 10/28/2014, SpO2 96%.  Medical Problem List and Plan: 1. 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 Supervision to min A with RW      Con't CIR PT and OT  2.  Antithrombotics: -DVT/anticoagulation:  Mechanical:  Antiembolism stockings, knee (TED hose) Bilateral lower extremities-- 01/14/25 DVT study neg -antiplatelet therapy:             -1/23 begin daily lovenox  40mg  daily   1/29- will need 1-2 months after d/c.   3. Pain Management: Baclofen  5 mg BID. Oxycodone  and robaxin  PRN.   -1/16- will increase Oxy to 10-15 mg q3 hours prn- since pain only went to 8/10 with pain meds- usually 9 or higher when meds wear off. -improved 1/17 -1/20 pt appears to have adhesive capsulitis of the right shoulder with referred pain. I suspect when she was working with therapy yesterday that she met resistance and pushed past it where she felt grinding or popping as well as pain. We discussed the importance of improving her ROM and at least maintaining it.   1/23 lidocaine  patch helpful for scapular pain   -pt continues to work on right shoulder ROM   1/26- patient pain is controlled- not bad, even with therapy.   1/29- hasn't taken opiates since Sunday  1/30- took pain meds last night  4. Mood/Behavior/Sleep: LCSW to follow for evaluation and support when available.              -antipsychotic agents: Ativan  0.5 mg bid prn   5. Neuropsych/cognition: This patient is capable of making decisions on her own behalf.  6. Skin/Wound Care: Routine pressure relief measures. Monitor incision sites for signs  of infection. Hemo Vac removed 1/15.  1/23 --postop dressing soiled, loosening. New dry dressing place. Small open area at the superior aspect of the wound -01/29/24 MRI C-spine last night without spinal cord findings, but does show seroma-- weekday team can reach out to NSG to see if they'd like to drain this area or let it reabsorb (nonurgent, no evidence of infection)   7. Fluids/Electrolytes/Nutrition: Monitor I&O and weight. Follow up labs CBC/CMP                -Carb modified diet with supplements to continue.   8. Cervical spinal stenosis: s/p laminectomy by Dr. Joshua 1/12. NSGRY following, continue on decadron  taper.  -01/29/25 pt making me aware that she's having more weakness-- in all extremities, but LLE more-- didn't mention this yesterday. Will reach out to NSG--[facility] will review, suspects myelopathy recovery  9. DM2: A1c 7.1. Holding home metformin , glipizide  and  januvia .   -Hyperglycemia in the setting of steroids.  SSI. Novolog , Lantus  30 units daily. Novolog  7U with meals scheduled as well.  1/19 CBGs still pretty high although better this am.   increased Lantus  to 35U daily as decadron  tapers down.  1/23-decadron  1mg  every day for 3 days then off -sugars still quite high. Will bump lantus  to 38u daily. -continue same novolog  7u with meals  -01/21/25 CBGs doing well, cont regimen 1/26- BG's running 93-155- will monitor closely today since feeling ill/nauseated- don't want her dropping too low  1/27- BG's 140s-368- went up- x1- will check/monitor more closely.  1/30- back to regular diet- a little more labile, up to 197- con't to monitor/regimen -01/29/25 CBGs doing much better in last 48hrs; monitor CBG (last 3)  Recent Labs    01/28/25 1650 01/28/25 2101 01/29/25 0612  GLUCAP 135* 117* 98    10.CKD IIIa: Stable, baseline range 1.1-1.5.  .   1/16- Cr 0.8 and BUN 30- is dry- will push fluids- and recheck Monday 1/19 BUN up to 38---push fluids, she should be able to take in plenty on her own.  1/22 labs reviewed and consistent--f/u Monday   1/26 Cr 0.98 and BUN 21- slightly dry- will ask pt to push fluids  1/27- will order CMP tomorrow 1/28- Cr and BUN looking better- con't to monitor- will keep her on IVF's today since not drinking a lot-   1/29- BUN 12 down from 19 and Cr 0.77- will stop IVFs this AM  11. Acute bronchitis: CT from 1/1 without acute intrathoracic pathology. Completed 10 day course of doxycycline  1/15.               -encourage ins spiro.   12. HLD: Lipitor  80 mg   13. HTN: Holding metoprolol , lisinopril , triamterene -HCTZ. Monitor bp per protocol for need to resume.    -1/22-24 bp controlled 1/26- Pt's BP slightly soft, will monitor esp in setting of being slightly dry 1/29- BP a little elevated- 150's to 164 systolic- likely due ot increased fluids form IVF's- will monitor, because usually low 1/30- usually running 110-s  120's except AD episode today-  -01/29/25 BPs better, no further AD noted Vitals:   01/25/25 1507 01/25/25 1953 01/26/25 0544 01/26/25 1304  BP: (!) 137/59 (!) 164/70 128/63 121/66   01/26/25 2005 01/27/25 0529 01/27/25 1242 01/27/25 2019  BP: 126/67 117/61 106/76 (!) 111/48   01/28/25 0500 01/28/25 1258 01/28/25 1950 01/29/25 0348  BP: 137/67 (!) 122/59 134/69 128/60    14. Neurogenic bladder: Foley removed on 1/14  monitor PVRs d/t potential for urinary retention.  1/19 PVR's low/absent. Occasional incontinence  -timed emptying for now q4 while awake  15. Neurogenic bowel: Continue Senna bid.  -Will give Sorbitol  45 cc x1 this afternoon- if no BM today, will give 60cc tomorrow and SSE. -1/16- had huge BM on toilet- but still feels like could go- will increase Senna to 2 tabs BID since increasing bowel meds- and keep on Sorbitol  prn if needed- d/c'd Bowel program for now since went on toilet- but might need to come back if starts having incontinence.  -01/14/25 LBM 1/15, pt stated no bowel program yesterday. Will restart suppository, continue bowel program, advised pt to use sorbitol  today before dinner as well to encourage better output tonight with program. Could see if she can d/c bowel program again later.-- 1/18 good output with bowel program so leave in place -1/23 had large liquid bm at 1300 yesterday   -will dc senna  -maintain PM bowel program  -01/21/25 liquid BM last night without bowel program-- previously constipated so hesitant to adjust meds, so pt will tell nursing staff to call us  if persistent loose stools. Leave meds as is for now.  1/26- will make suppository prn for now, but checking KUB to  make sure not backed up and only loose stool getting through, vs ileus?- \ 1/29- will order Mg citrate 1/2 bottle at 1pm and 1/2 bottle at 5pm- with nightly supp at 6pm -01/28/25 emptied out last night/this AM, cont regimen as is for now -01/29/25 LBM yesterday  16. Nausea and  reflux--improved  -added protonix  daily--increased to 40mg  bid 1/22 with improvement  -prn tums, dc'ed maalox  1/26- added Maalox back prn just in case.   17. Questionable partial small bowel obstruction 1/27- ordered another KUB because want to make sure not getting worse- concerned,because pt feeling worse- ordered Phenergan - d/w nursing to get done ASAP- and pt on liquid diet now- decided due to Sx's being significantly worse, will place NGT via Fluoro today and hopefully she will feel better.  1/28- removed NGT- since no output- is very constipated- 1 medium and 1 small BM- will do Sorbitol  60cc again and another SMOG enema to get stool that's moved down- maintain IVF's for 1 more day- - let nurse know can  do crackers but otherwise liquid diet.  1/29- changed back to regular diet by NP- still very constipated- had 1 large, 1 small BM- explained it's likely high up, so will give Mg citrate 1 full bottle over 2 times- and suppository at 6pm- since enema just came right back out  1/30- only 1 small BM yesterday- feeling much better- will add Lactulose  20cc prn daily- to take if pt needs it- d/w pt- also added Gas-x QID 80 mg -01/28/25 see #15, LBM this morning-- reports symptoms improved  18. 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   19. 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

## 2025-01-29 NOTE — Progress Notes (Signed)
 "   Providing Compassionate, Quality Care - Together   NEUROSURGERY PROGRESS NOTE     S: Complaints of worsening BUE/BLE weakness and numbness for a few days.    O: EXAM:  BP 128/60 (BP Location: Right Arm)   Pulse (!) 57   Temp 98.6 F (37 C) (Oral)   Resp 18   Ht 5' 9 (1.753 m)   Wt 115.2 kg   LMP 10/28/2014   SpO2 96%   BMI 37.50 kg/m     Awake, alert, oriented  Speech fluent, appropriate  CNs grossly intact RLE 5/5, LLE 4+/5 BUE grip, biceps 4/5, triceps and deltoids 3/5 SILTx4 Incision c/d/I  MR CERVICAL SPINE WO CONTRAST Result Date: 01/28/2025 CLINICAL DATA:  Initial evaluation for spinal cord injury, new onset left lower extremity weakness. EXAM: MRI CERVICAL SPINE WITHOUT CONTRAST TECHNIQUE: Multiplanar, multisequence MR imaging of the cervical spine was performed. No intravenous contrast was administered. COMPARISON:  Comparison made with prior MRI from 01/04/2025. FINDINGS: Alignment: Straightening with slight reversal of the normal cervical lordosis. No significant listhesis. Vertebrae: Postoperative changes from recent wide posterior decompressive laminectomy, medial facetectomy, and fusion at C2-3 through C6-7. Large collection extending from the laminectomy defect into the posterior paraspinous soft tissues along the surgical incision is partially visualized. Image portions measure up to 6.6 x 8.1 x 7.2 cm. No significant surrounding inflammatory changes. Finding favored to reflect a large postoperative seroma. No visible dural defect to suggest CSF leak, although this is not entirely excluded. No significant sagging at the base of the brain to suggest CSF hypotension. Chronic compression deformities noted at the superior endplates of T1 and T2. Vertebral body height otherwise maintained without acute or recent fracture. Underlying bone marrow signal intensity overall within normal limits. No worrisome osseous lesions. No significant abnormal marrow edema. Cord: Normal  signal and morphology. No convincing cord signal changes are seen on this mildly motion degraded exam. Previously questioned signal abnormality at C3-4 and C4-5 is not convincingly seen. Posterior Fossa, vertebral arteries, paraspinal tissues: Visualized brain and posterior fossa within normal limits. Craniocervical junction within normal limits. Postoperative changes with fluid collection within the posterior paraspinous soft tissues as above. Normal flow voids seen within the vertebral arteries bilaterally. Disc levels: C2-C3: No significant disc bulge. Ossification of the posterior longitudinal ligament. Mild cord flattening without cord signal changes. Moderate spinal stenosis. Foramina remain patent. C3-C4: Interval posterior decompression and fusion. Disc bulge with uncovertebral spurring and OPLL. Residual mild spinal stenosis, improved. Mild left C4 foraminal narrowing. Right neural foramen appears patent. C4-C5: Prior posterior decompression with fusion. Disc bulge with uncovertebral spurring and OPLL. Residual mild to moderate spinal stenosis, improved. Mild to moderate bilateral C5 foraminal narrowing. C5-C6: Mild disc bulge with uncovertebral spurring and OPLL. Prior posterior decompression with fusion. No significant residual spinal stenosis. Mild-to-moderate right C6 foraminal narrowing. Left neural foramina remains patent. C6-C7: Small right paracentral disc protrusion. No significant spinal stenosis. Foramina appear patent. C7-T1: Minimal disc bulge. Mild facet hypertrophy. No significant spinal stenosis. Foramina appear patent. T1-2: Mild disc bulge.  No significant canal or foraminal stenosis. T2-3: Seen only on sagittal projection. Diffuse disc bulge with endplate spurring. Resultant mild spinal stenosis. Foramina appear patent. IMPRESSION: 1. Postoperative changes from recent posterior decompression and fusion at C2-C7. Large collection extending from the laminectomy defect into the posterior  paraspinous soft tissues along the surgical incision, measuring up to 6.6 x 8.1 x 7.2 cm. Finding favored to reflect a large postoperative seroma. No visible dural  defect to suggest CSF leak, although this is not entirely excluded. No significant sagging at the base of the brain to suggest CSF hypotension. No significant surrounding inflammatory changes to suggest abscess/infection. 2. No other acute abnormality within the cervical spine. Previously question cord signal changes are not convincingly seen on this mildly motion degraded exam. No other MRI evidence for acute cord injury. 3. 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 Residual mild to moderate spinal stenosis at C2-3 through C4-5, improved from prior. Electronically Signed   By: Morene Hoard M.D.   On: 01/28/2025 01:16       ASSESSMENT:  54 y.o. s/p C2-C7 posterior laminectomy/fusion 01/09/25 for cervical myelopathy, post op MRI with soft tissue seroma without ongoing cord compression    PLAN: -Continue supportive care per primary team -D/w patient this is likely c/w typical postoperative cervical myelopathy recovery.    Camie Pickle, PAC  "

## 2025-01-30 ENCOUNTER — Ambulatory Visit: Admitting: Family Medicine

## 2025-01-30 ENCOUNTER — Inpatient Hospital Stay (HOSPITAL_COMMUNITY)

## 2025-01-30 ENCOUNTER — Encounter (HOSPITAL_COMMUNITY): Payer: Self-pay | Admitting: Physical Medicine and Rehabilitation

## 2025-01-30 DIAGNOSIS — R911 Solitary pulmonary nodule: Secondary | ICD-10-CM | POA: Diagnosis present

## 2025-01-30 DIAGNOSIS — E876 Hypokalemia: Secondary | ICD-10-CM | POA: Diagnosis present

## 2025-01-30 DIAGNOSIS — N183 Chronic kidney disease, stage 3 unspecified: Secondary | ICD-10-CM | POA: Diagnosis present

## 2025-01-30 DIAGNOSIS — K5900 Constipation, unspecified: Secondary | ICD-10-CM | POA: Diagnosis present

## 2025-01-30 DIAGNOSIS — N319 Neuromuscular dysfunction of bladder, unspecified: Secondary | ICD-10-CM | POA: Diagnosis present

## 2025-01-30 DIAGNOSIS — G904 Autonomic dysreflexia: Secondary | ICD-10-CM | POA: Diagnosis present

## 2025-01-30 LAB — BASIC METABOLIC PANEL WITH GFR
Anion gap: 8 (ref 5–15)
BUN: 8 mg/dL (ref 6–20)
CO2: 28 mmol/L (ref 22–32)
Calcium: 8.5 mg/dL — ABNORMAL LOW (ref 8.9–10.3)
Chloride: 103 mmol/L (ref 98–111)
Creatinine, Ser: 0.74 mg/dL (ref 0.44–1.00)
GFR, Estimated: >60 mL/min
Glucose, Bld: 132 mg/dL — ABNORMAL HIGH (ref 70–99)
Potassium: 4 mmol/L (ref 3.5–5.1)
Sodium: 139 mmol/L (ref 135–145)

## 2025-01-30 LAB — CBC
HCT: 29 % — ABNORMAL LOW (ref 36.0–46.0)
Hemoglobin: 9.6 g/dL — ABNORMAL LOW (ref 12.0–15.0)
MCH: 30.9 pg (ref 26.0–34.0)
MCHC: 33.1 g/dL (ref 30.0–36.0)
MCV: 93.2 fL (ref 80.0–100.0)
Platelets: 274 10*3/uL (ref 150–400)
RBC: 3.11 MIL/uL — ABNORMAL LOW (ref 3.87–5.11)
RDW: 12.3 % (ref 11.5–15.5)
WBC: 4.7 10*3/uL (ref 4.0–10.5)
nRBC: 0 % (ref 0.0–0.2)

## 2025-01-30 LAB — GLUCOSE, CAPILLARY
Glucose-Capillary: 129 mg/dL — ABNORMAL HIGH (ref 70–99)
Glucose-Capillary: 129 mg/dL — ABNORMAL HIGH (ref 70–99)
Glucose-Capillary: 108 mg/dL — ABNORMAL HIGH (ref 70–99)

## 2025-01-30 MED ORDER — DEXAMETHASONE 4 MG PO TABS
4.0000 mg | ORAL_TABLET | Freq: Four times a day (QID) | ORAL | Status: DC
  Administered 2025-01-30 – 2025-02-03 (×15): 4 mg via ORAL
  Filled 2025-01-30 (×15): qty 1

## 2025-01-30 MED ORDER — DOCUSATE SODIUM 283 MG RE ENEM
1.0000 | ENEMA | Freq: Every day | RECTAL | Status: DC
  Administered 2025-01-31: 283 mg via RECTAL
  Filled 2025-01-30 (×3): qty 1

## 2025-01-30 MED ORDER — DEXAMETHASONE 4 MG PO TABS
10.0000 mg | ORAL_TABLET | Freq: Once | ORAL | Status: AC
  Administered 2025-01-30: 10 mg via ORAL
  Filled 2025-01-30: qty 1

## 2025-01-30 NOTE — Progress Notes (Signed)
 Occupational Therapy Note  Patient Details  Name: April Warren MRN: [id number] Date of Birth: April 14, 1971  Today's Date: 01/30/2025 OT Missed Time: 75 Minutes Missed Time Reason: Pain;Patient fatigue  OT reviewing chart and noting decreased numbness/weakness in extremities since Friday.  Pt since had been checked by MD and neurosurgery. Pt with increased fatigue and weakness and politely declined therapy services this afternoon.   Camie Hoe, OTD, OTR/L 01/30/2025, 2:02 PM

## 2025-01-30 NOTE — Progress Notes (Signed)
 Patient ID: April Warren, female   DOB: 01/12/1971, 55 y.o.   MRN: [id number] I think she is somewhat weaker than when I last saw her.  The left hand seems to be back to its original weakness and the left foot drop seems to be back to its original weakness before surgery.  He is complaining of more pain in the neck and tingling in the shoulders.  MRI shows a seroma but it does not appear to be compressive.  Agree with steroids  Would ask interventional radiology the to draw off some of the seroma if at all possible.  Would prefer this over an operation.  Also could rule out deep wound infection with this, though the MRI does not suggest that.  The wound looks pristine.

## 2025-01-30 NOTE — Plan of Care (Signed)
" °  Problem: Consults Goal: RH SPINAL CORD INJURY PATIENT EDUCATION Description:  See Patient Education module for education specifics.  Outcome: Progressing   Problem: SCI BOWEL ELIMINATION Goal: RH STG MANAGE BOWEL WITH ASSISTANCE Description: STG Manage Bowel with bowel program min Assistance. Outcome: Progressing   Problem: SCI BLADDER ELIMINATION Goal: RH STG MANAGE BLADDER WITH ASSISTANCE Description: STG Manage Bladder With bladder program min Assistance Outcome: Progressing   Problem: RH SKIN INTEGRITY Goal: RH STG SKIN FREE OF INFECTION/BREAKDOWN Description: Manage skin free of infection with min assistance Outcome: Progressing   Problem: RH SAFETY Goal: RH STG ADHERE TO SAFETY PRECAUTIONS W/ASSISTANCE/DEVICE Description: STG Adhere to Safety Precautions With min Assistance/Device. Outcome: Progressing   Problem: RH PAIN MANAGEMENT Goal: RH STG PAIN MANAGED AT OR BELOW PT'S PAIN GOAL Description: <4 w/ prns Outcome: Progressing   Problem: RH KNOWLEDGE DEFICIT SCI Goal: RH STG INCREASE KNOWLEDGE OF SELF CARE AFTER SCI Description: Manage increase knowledge of self care with min assistance from spouse using educational materials provided Outcome: Progressing   "

## 2025-01-30 NOTE — Progress Notes (Signed)
 Physical Therapy Session Note  Patient Details  Name: April Warren MRN: [id number] Date of Birth: May 13, 1971  Today's Date: 01/30/2025 PT Individual Time: 8954-8844 PT Individual Time Calculation (min): 70 min   Short Term Goals: Week 3:  PT Short Term Goal 1 (Week 3): Pt will navigate stairs with assist x 4 PT Short Term Goal 2 (Week 3): Pt will ambulate 150 ft without w/c follow PT Short Term Goal 3 (Week 3): Pt will perform STS with supervision intermittently  Skilled Therapeutic Interventions/Progress Updates:    Pt recd in bed, agreeable to therapy, but reports up to 8/10  spasm pain in shoulders/neck as well as feeling weaker this session. Required max for trunk elevation during bed mobility d/t pain and feeling unsteady. Required min-mod of 2 (pt's husband assisting) to stand from stedy. Pt requesting to use bathroom, so stedy transfer to commode for continent void. BP assessed, 147/75(94), pt reporting feelings of anxiety. After voiding, BP down to 133/68 (86) and then 120/76(91). Required assist to maintain upright on stedy, which is not typical for her. Pt returned to bed with max for bed mobility. Dr. Joshua, Neurosurgery, in/out to assess pt status. Therapist provided update on functional status. Pt able to participate in bed level exercise to boost mood via endorphins and promote neurologic recovery. Performed SAQ 3 x 10 and BIL ankle pumps 3 x 20 for strength and endurance. Pt remained in bed, was left with all needs in reach and alarm active.   Therapy Documentation Precautions:  Precautions Precautions: Fall Recall of Precautions/Restrictions: Intact Precaution/Restrictions Comments: cervical Restrictions Weight Bearing Restrictions Per Provider Order: No General:       Therapy/Group: Individual Therapy  [name] 01/30/2025, 11:46 AM

## 2025-01-31 ENCOUNTER — Inpatient Hospital Stay (HOSPITAL_COMMUNITY)

## 2025-01-31 LAB — GLUCOSE, CAPILLARY
Glucose-Capillary: 117 mg/dL — ABNORMAL HIGH (ref 70–99)
Glucose-Capillary: 230 mg/dL — ABNORMAL HIGH (ref 70–99)
Glucose-Capillary: 288 mg/dL — ABNORMAL HIGH (ref 70–99)
Glucose-Capillary: 323 mg/dL — ABNORMAL HIGH (ref 70–99)
Glucose-Capillary: 236 mg/dL — ABNORMAL HIGH (ref 70–99)
Glucose-Capillary: 261 mg/dL — ABNORMAL HIGH (ref 70–99)

## 2025-01-31 MED ORDER — SENNA 8.6 MG PO TABS
2.0000 | ORAL_TABLET | Freq: Every day | ORAL | Status: AC
  Administered 2025-01-31 – 2025-02-03 (×4): 17.2 mg via ORAL
  Filled 2025-01-31 (×4): qty 2

## 2025-01-31 MED ORDER — LIDOCAINE HCL (PF) 1 % IJ SOLN
10.0000 mL | Freq: Once | INTRAMUSCULAR | Status: AC
  Administered 2025-01-31: 10 mL

## 2025-01-31 NOTE — Progress Notes (Signed)
 Patient ID: April Warren, female   DOB: [birth year], 54 y.o.   MRN: [id number] Continues to complain of some left shoulder pain.  Incision remains clean dry and intact.  No drainage.  Left hand and foot same as yesterday despite Decadron .  Awaiting interventional radiology aspiration of seroma.  Have asked the rehab PA to make sure they send it for Gram stain and culture.  Went over the imaging with the patient's husband, including the MRI and CT scan of the cervical spine.  Continue Decadron  Aspiration of seroma though I have doubt about it significance unless it is infected.  2 of my partners have gone over the imaging with me and tend to agree.

## 2025-01-31 NOTE — Procedures (Signed)
 Vascular and Interventional Radiology Procedure Note  Patient: April Warren DOB: 09/07/1971 Medical Record Number: [id number] Note Date/Time: 01/31/25 10:28 AM   Performing Physician: Thom Hall, MD Assistant(s): None  Diagnosis:  Hx C2-C7  laminectomy/fusion 01/09/25 fw post-op fluid collection  Procedure: ASPIRATION OF POSTERIOR CERVICAL SUBCUTANEOUS FLUID COLLECTION  Anesthesia: Local Anesthetic Complications: None Estimated Blood Loss: Minimal Specimens: Sent for Gram Stain, Aerobe Culture, Anerobe Culture, Fungal, and Cell count with Differential  Findings:  Successful Ultrasound-guided DX aspiration of posterior cervical SQ fluid collection. 145 mL of SS fluid aspirated.   See detailed procedure note with images in PACS. The patient tolerated the procedure well without incident or complication and was returned to Recovery in stable condition.    Thom Hall, MD [facility]   Pager. [phone] Clinic. [phone]

## 2025-01-31 NOTE — Plan of Care (Signed)
" °  Problem: Consults Goal: RH SPINAL CORD INJURY PATIENT EDUCATION Description:  See Patient Education module for education specifics.  Outcome: Progressing   Problem: SCI BOWEL ELIMINATION Goal: RH STG MANAGE BOWEL WITH ASSISTANCE Description: STG Manage Bowel with bowel program min Assistance. Outcome: Progressing   Problem: SCI BLADDER ELIMINATION Goal: RH STG MANAGE BLADDER WITH ASSISTANCE Description: STG Manage Bladder With bladder program min Assistance Outcome: Progressing   Problem: RH SKIN INTEGRITY Goal: RH STG SKIN FREE OF INFECTION/BREAKDOWN Description: Manage skin free of infection with min assistance Outcome: Progressing   Problem: RH SAFETY Goal: RH STG ADHERE TO SAFETY PRECAUTIONS W/ASSISTANCE/DEVICE Description: STG Adhere to Safety Precautions With min Assistance/Device. Outcome: Progressing   Problem: RH PAIN MANAGEMENT Goal: RH STG PAIN MANAGED AT OR BELOW PT'S PAIN GOAL Description: <4 w/ prns Outcome: Progressing   Problem: RH KNOWLEDGE DEFICIT SCI Goal: RH STG INCREASE KNOWLEDGE OF SELF CARE AFTER SCI Description: Manage increase knowledge of self care with min assistance from spouse using educational materials provided Outcome: Progressing   "

## 2025-01-31 NOTE — Progress Notes (Signed)
 Occupational Therapy Note  Patient Details  Name: April Warren MRN: [id number] Date of Birth: 11-08-1971  Today's Date: 01/31/2025 OT Missed Time: 60 Minutes Missed Time Reason: Unavailable (comment) (pt off unit for procedure)  Pt off unit for aspiration procedure.    Catheryne Deford, OTD, OTR/L 01/31/2025, 12:05 PM

## 2025-01-31 NOTE — Progress Notes (Signed)
 Physical Therapy Session Note  Patient Details  Name: April Warren MRN: [id number] Date of Birth: 06/07/1971  Today's Date: 01/31/2025 PT Individual Time: 0820-0915 PT Individual Time Calculation (min): 55 min   Short Term Goals: Week 3:  PT Short Term Goal 1 (Week 3): Pt will navigate stairs with assist x 4 PT Short Term Goal 2 (Week 3): Pt will ambulate 150 ft without w/c follow PT Short Term Goal 3 (Week 3): Pt will perform STS with supervision intermittently  Skilled Therapeutic Interventions/Progress Updates:    Pt recd in bed with nsg present for meds pass and pt's husband preparing to assist with breakfast. Pt given time to eat and receive morning meds, missed x 20 min for meds and breakfast, will attempt to make up time as able.  On therapist return, pt requesting toileting as she had not gone since day before. Bed mobility with up to max a for trunk elevation d/t pain and functional core weakness. Used stedy transfer with mod +2 (pt's husband assisting) for transfer to commode, tot a for 3/3 toileting tasks. Pt requiring increased time d/t increased pain in BUE and neck, as well as BLE weakness. Pt agreeable to sitting up in w/c. Pt participated in oral hygiene at w/c level with assist for bimanual tasks d/t extreme LUE pain. Pt set up in w/c with needs in reach and LUE supported. Discussed need to drink more water d/t tea colored urine noted in [facility] and infrequent urination. Pt expressed understanding.   Therapy Documentation Precautions:  Precautions Precautions: Fall Recall of Precautions/Restrictions: Intact Precaution/Restrictions Comments: cervical Restrictions Weight Bearing Restrictions Per Provider Order: No General: PT Amount of Missed Time (min): 20 Minutes PT Missed Treatment Reason: Other (Comment) (breakfast and meds pass)     Therapy/Group: Individual Therapy  [name] 01/31/2025, 3:41 PM

## 2025-01-31 NOTE — Patient Care Conference (Signed)
 Inpatient RehabilitationTeam Conference and Plan of Care Update Date: 01/31/2025   Time: 1122 am    Patient Name: April Warren      Medical Record Number: [id number]  Date of Birth: 26-May-1971 Sex: Female         Room/Bed: 4W12C/4W12C-02 Payor Info: Payor: TRICARE / Plan: [employer]ARE [place] / Product Type: *No Product type* /    Admit Date/Time:  01/12/2025  1:37 PM  Primary Diagnosis:  Acute incomplete quadriplegia 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 Joesph Likes Social Worker Present: Graeme Jude, LCSW Nurse Present: Eulalio Falls, RN PT Present: Schuyler Batter, PT OT Present: Camie Hoe, OT PPS Coordinator present : Eleanor Colon, SLP     Current Status/Progress Goal Weekly Team Focus  Bowel/Bladder   Continent of bladder and bowel   Maintain continence of bowel and bladder during admission   Assess bowel and bladder q shift and help in toileting    Swallow/Nutrition/ Hydration               ADL's   SBA overall although now has had change of status with increased weakness in all extremities and more pain, have not seen pt since change of status   SBA-mod I, upgraded 1/26   family education, ADL and transfer retraining    Mobility   Has been CGA, but demoes decline since Friday with  L>RLE weakness and UE weakness as well. Requires as much as max for bed mobility and mod +2 to stand with stedy   supervision overall, gait to 150 ft  BLE strength    Communication                Safety/Cognition/ Behavioral Observations               Pain   Surgical incision pain 6/10   maintain pain level less than 3   pain assess every 2 hrs , administer pain meds as ordered    Skin   Small open area at the superior aspect of the wound   Wound will not be infected and heal during the stay in rehab  assess skin q shiftb and address issues as needed      Discharge Planning:  Pt will d/c to home with her husband. 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 Fam edu on 2/9 9am-11am.SW will confirm there are no barriers to discharge.    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 Patient with neurogenic bowels, noted episode of Autonomic dysreflexia. Patient with worsening weakness on all extremities and more pain: MD adjusting medications and treatments.   Patient on target  to meet rehab goals: Patient needs SBA overall with ADLs. Recently status has changed due to weakness on all extremities requiring max assist with bed mobility and mod assist +2 with sit to stand using a stedy. Overall goals at discharge are set for supervision assistance.   *See Care Plan and progress notes for long and short-term goals.   Revisions to Treatment Plan:  Bladder scan  Seroma aspiration by IR Neurology consult Stedy  Teaching Needs: Safety, medications, transfers, toileting, Autonomic dysreflexia  education, etc   Current Barriers to Discharge: Decreased caregiver support, Home enviroment access/layout, Neurogenic bowel and bladder, and Weight  Possible Resolutions to Barriers: Family Education     Medical Summary Current Status: rare voids;  having Autonomic dysreflexia intermittently- worsening weakness- which not sure why- trying to determine;  Barriers to Discharge: Incontinence;Medical stability;Morbid Obesity;Neurogenic Bowel & Bladder;Behavior/Mood;Self-care education;Spasticity;Weight bearing restrictions;Uncontrolled Hypertension;Other (comments)  Barriers to Discharge Comments: worsening weakness- has seroma aspiration today and put on decadron  4 mg q6 hours- now needing stedy- from walking- [facility] involved- Possible Resolutions to [employer] Focus: lactulose  prn- for getting more backed up-; added decadron ; -  also seroma aspiration- infected?;'  d/c moved to 2/20   Continued Need for Acute Rehabilitation Level of Care: The patient requires daily medical management by a physician with specialized training in physical medicine and rehabilitation for the following reasons: Direction of a multidisciplinary physical rehabilitation program to maximize functional independence : Yes Medical management of patient stability for increased activity during participation in an intensive rehabilitation regime.: Yes Analysis of laboratory values and/or radiology reports with any subsequent need for medication adjustment and/or medical intervention. : Yes   I attest that I was present, lead the team conference, and concur with the assessment and plan of the team.   Breda Bond Gayo 01/31/2025, 1122 am

## 2025-01-31 NOTE — Progress Notes (Signed)
 Occupational Therapy Weekly Progress Note  Patient Details  Name: April Warren MRN: [id number] Date of Birth: 1971/08/18  Beginning of progress report period: January 21, 2025 End of progress report period: January 31, 2025  Today's Date: 01/31/2025 OT Individual Time: 1430-1445    and Today's Date: 01/31/2025 OT Missed Time: 60 Minutes; 45 min  Missed Time Reason: Unavailable (comment) (pt off unit for procedure)   Pt had been progressing toward goals and was initially CGA-SBA for all ADLs.  Recently since Friday, pt had change of status with decrease strength and mobility in all 4 extremities. Pt has aspiration procedure today and pt reporting feeling better after. D/C extended a week in order to progress after change of status.   Patient continues to demonstrate the following deficits: muscle weakness and muscle joint tightness, decreased cardiorespiratoy endurance, and decreased sitting balance, decreased standing balance, decreased postural control, and decreased balance strategies and therefore will continue to benefit from skilled OT intervention to enhance overall performance with BADL and Reduce care partner burden.  Patient progressing toward long term goals..  Continue plan of care.  OT Short Term Goals Week 3:  OT Short Term Goal 1 (Week 3): patient will complete toileting 3/3 CGA OT Short Term Goal 2 (Week 3): patient will complete LB bathing UB/LB SUP A OT Short Term Goal 3 (Week 3): Patient to increase AROM into R shoulder flexion to 150 degrees  Skilled Therapeutic Interventions/Progress Updates:      Therapy Documentation Precautions:  Precautions Precautions: Fall Recall of Precautions/Restrictions: Intact Precaution/Restrictions Comments: cervical Restrictions Weight Bearing Restrictions Per Provider Order: No General: Pt supine in bed upon OT arrival, agreeable to OT session.  Pain:  5/10 pain reported in Lt shoulder, activity, intermittent rest breaks,  distractions provided for pain management, pt reports tolerable to proceed.   Other Treatments: Pt has recently gotten back from procedure. OT using TUOS and explaining rationale behind extending D/C for increased independence. Pt reporting really missing kids although understanding why she needs extra time for recovery. OT discussing progress is not linear, and using empathetic discussing for understanding. Pt reporting feeling better and is hoping to get back at it tomorrow.    Pt supine in bed with bed alarm activated, 2 bed rails up, call light within reach and 4Ps assessed.     Therapy/Group: Individual Therapy  Camie Hoe, OTD, OTR/L 01/31/2025, 3:43 PM

## 2025-02-01 LAB — GLUCOSE, CAPILLARY
Glucose-Capillary: 219 mg/dL — ABNORMAL HIGH (ref 70–99)
Glucose-Capillary: 221 mg/dL — ABNORMAL HIGH (ref 70–99)
Glucose-Capillary: 283 mg/dL — ABNORMAL HIGH (ref 70–99)
Glucose-Capillary: 235 mg/dL — ABNORMAL HIGH (ref 70–99)

## 2025-02-01 MED ORDER — LIDOCAINE HCL URETHRAL/MUCOSAL 2 % EX GEL: CUTANEOUS | Status: AC | PRN

## 2025-02-01 MED ORDER — DOCUSATE SODIUM 283 MG RE ENEM
1.0000 | ENEMA | Freq: Every day | RECTAL | Status: AC
  Administered 2025-02-01: 283 mg via RECTAL
  Filled 2025-02-01 (×3): qty 1

## 2025-02-01 NOTE — Progress Notes (Signed)
 Physical Therapy Session Note  Patient Details  Name: April Warren MRN: [id number] Date of Birth: [birth year]  Today's Date: 02/01/2025 PT Individual Time: 1015-1130 and 1330-1445 PT Individual Time Calculation (min): 75 min and 75 min  Short Term Goals: Week 3:  PT Short Term Goal 1 (Week 3): Pt will navigate stairs with assist x 4 PT Short Term Goal 2 (Week 3): Pt will ambulate 150 ft without w/c follow PT Short Term Goal 3 (Week 3): Pt will perform STS with supervision intermittently  Skilled Therapeutic Interventions/Progress Updates: Pt presented handoff from OT in ortho gym agreeable to therapy. Pt c/o mild unrated pain in shoulder, lidocaine  patch in place with no additional intervention requested. Pt transferred to semi-fowler's position on mat with minA and use of large wedge. PTA performed grade ll-lll posterior and inferior mobs with oscillations at end rage. PTA also followed up with PROM with stretching at end range, flexion, abduction, and ER. PTA instructing pt in AAROM with use of dowel (1lb) for development of HEP as noted below.   Access Code: [email or web address] URL: [email or web address] Date: 02/01/2025 Prepared by: Margeret Khamiyah Grefe  Exercises - Shoulder Scaption AAROM with Dowel  - 1 x daily - 7 x weekly - 1 sets - 10 reps - Supine Shoulder Flexion Extension AAROM with Dowel  - 1 x daily - 7 x weekly - 1 sets - 10 reps - Supine Shoulder External Rotation with Dowel  - 1 x daily - 7 x weekly - 1 sets - 10 reps - Standing Shoulder Extension AAROM with Dowel  - 1 x daily - 7 x weekly - 1 sets - 10 reps  Once completed pt then participated in the follow supine/seated  LE therex for general strengthening. Pt required minA to complete supine to sit for truncal support.  RLE 2.5lb cuff/LLE 2lb cuff SLR 2 x 8 LLE SLR 2 x 10 RLE SAQ 2 x 10 bilaterally Bridges with ball squeeze 2 x 10 Hip ER with green theraband 2 x 10 LAQ 2 x 10 Seated marches 2 x 10  Completed  ambulatory transfer with CGA back to w/c and transported back to room. At entrance of room w/c locked and pt ambulated in room ~90ft with RW and CGA! Pt left in w/c at end of session and remained in w/c with call bell within reach and needs met.    Tx2: Pt presented in w/c with husband present agreeable to therapy. Pt c/o weakness and pain in shoulder, no intervention requested. Pt transported to day room for energy conservation. Completed stand step transfer with RW and minA to mat. Participated in Sit to stand x 5 from 22in surface with emphasis on BLE NOT pressing against mat. After seated rest pt completed x 5 from 21in surface then from 20in surface. All completed with CGA and verbal cues for pushing forward vs directly up. Noted mild instability in LLE during last round. PTA provided pt with knee cage on LLE for biofeedback to minimize genu recurvatum. Pt noted improved tolerance with knee cage. After brief seated rest pt then ambulated ~36ft x 2 with RW and knee cage. Pt then ambulated 28ft with w/c follow with CGA. Pt noted moderate fatigue after ambulation. Pt then participated in standing tolerance activity playing corn hole using alternating BUE. Pt with limited range in RUE with difficulty reaching above 90 degrees however noted limited strength in LUE with difficulty reaching board when throwing bags. Pt then transported over to [employer] and participate din 1 :  30 then 3:00 at 40cm/sec for BLE strengthening. Pt then transported back to room at end of session and remained in w/c with call bell within reach and needs met.        Therapy Documentation Precautions:  Precautions Precautions: Fall Recall of Precautions/Restrictions: Intact Precaution/Restrictions Comments: cervical Restrictions Weight Bearing Restrictions Per Provider Order: No General:     Therapy/Group: Individual Therapy  Sparsh Callens 02/01/2025, 4:24 PM

## 2025-02-01 NOTE — Plan of Care (Signed)
" °  Problem: SCI BOWEL ELIMINATION Goal: RH STG MANAGE BOWEL WITH ASSISTANCE Description: STG Manage Bowel with bowel program min Assistance. Outcome: Progressing   Problem: SCI BLADDER ELIMINATION Goal: RH STG MANAGE BLADDER WITH ASSISTANCE Description: STG Manage Bladder With bladder program min Assistance Outcome: Progressing   Problem: RH SKIN INTEGRITY Goal: RH STG SKIN FREE OF INFECTION/BREAKDOWN Description: Manage skin free of infection with min assistance Outcome: Progressing   Problem: RH SAFETY Goal: RH STG ADHERE TO SAFETY PRECAUTIONS W/ASSISTANCE/DEVICE Description: STG Adhere to Safety Precautions With min Assistance/Device. Outcome: Progressing   Problem: RH PAIN MANAGEMENT Goal: RH STG PAIN MANAGED AT OR BELOW PT'S PAIN GOAL Description: <4 w/ prns Outcome: Progressing   Problem: RH KNOWLEDGE DEFICIT SCI Goal: RH STG INCREASE KNOWLEDGE OF SELF CARE AFTER SCI Description: Manage increase knowledge of self care with min assistance from spouse using educational materials provided Outcome: Progressing   "

## 2025-02-01 NOTE — Progress Notes (Signed)
 "                                                        PROGRESS NOTE   Subjective/Complaints:  Pt reports no BM still= passing a lot of gas- even with Enemeez last night- got at 6pm.  LBM Sunday night- was very small.    Feels less bloated than yesterday.   Said they aspirated the seroma- got 145cc out!!!  BG's rising- but feels like legs a little stronger.    ROS: per HPI   Pt denies SOB, abd pain, CP, N/V/ still (+) C/D, and vision changes  Weakness L>R (+) mild improvement per pt  Objective:   US  IMAGE GUIDED FLUID DRAIN BY CATHETER Result Date: 01/31/2025 INDICATION: [id number] Fluid collection at surgical site [id number] Briefly, 54 year old female with a history of C2-C7 laminectomy and fusion on 01/09/2025, with postoperative fluid collection. EXAM: US -GUIDED POSTERIOR CERVICAL SUBCUTANEOUS FLUID COLLECTION ASPIRATION COMPARISON:  CT C-spine, 01/30/2025.  MR C-spine, 01/27/2025. MEDICATIONS: The patient is currently admitted to the hospital and receiving intravenous antibiotics. The antibiotics were administered within an appropriate time frame prior to the initiation of the procedure. ANESTHESIA/SEDATION: Local anesthetic was administered. CONTRAST:  None COMPLICATIONS: None immediate. PROCEDURE: Informed written consent was obtained from the patient after a discussion of the risks, benefits and alternatives to treatment. Preprocedural ultrasound scanning demonstrated a minimally complex posterior neck fluid collection, measuring up to 10 cm in greatest dimension. A timeout was performed prior to the initiation of the procedure. The posterior neck was prepped and draped in the usual sterile fashion. The overlying soft tissues were anesthetized with 1% lidocaine  with epinephrine. Under direct ultrasound guidance, a 6 Fr catheter via trocar technique was advanced into the abscess/fluid collection. Multiple ultrasound images were saved for procedural documentation purposes. Next, approximately  145 mL was aspirated from the collection. A representative sample of aspirated fluid was capped and sent to the laboratory for analysis. The needle was removed and superficial hemostasis was achieved with manual compression. A dressing was placed. The patient tolerated the procedure well without immediate postprocedural complication. IMPRESSION: Successful US -guided diagnostic and therapeutic aspiration of a posterior cervical subcutaneous fluid collection. 145 mL of serosanguineous fluid was removed. A representative aspirated sample was sent to the laboratory as requested by the ordering clinical team. Thom Hall, MD [facility] Electronically Signed   By: Thom Hall M.D.   On: 01/31/2025 11:52   CT CERVICAL SPINE WO CONTRAST Result Date: 01/30/2025 EXAM: CT CERVICAL SPINE WITHOUT CONTRAST 01/30/2025 06:24:59 PM TECHNIQUE: CT of the cervical spine was performed without the administration of intravenous contrast. Multiplanar reformatted images are provided for review. Automated exposure control, iterative reconstruction, and/or weight based adjustment of the mA/kV was utilized to reduce the radiation dose to as low as reasonably achievable. COMPARISON: 01/02/2025 CLINICAL HISTORY: Cervical radiculopathy, no red flags. FINDINGS: BONES AND ALIGNMENT: Posterior instrumented fusion hardware extends from C2 to C7. There is a small amount of lucency around the right-sided screw at the C7 level. No acute fracture or traumatic malalignment. 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 There is moderate spinal canal stenosis at the C2-C3 level due to the ossified posterior longitudinal ligament. Otherwise, the postsurgical levels are widely decompressed with  a patent spinal canal. SOFT TISSUES: There are postsurgical soft tissue changes dorsal to the C3-C6 levels which are posteriorly  decompressed. No prevertebral soft tissue swelling. IMPRESSION: 1. 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 The other levels have been surgically decompressed, and the spinal canal is now widely patent . 2. Posterior instrumented fusion hardware from C2 to C7 with a small amount of lucency around the right-sided screw at the C7 level. Electronically signed by: Franky Stanford MD 01/30/2025 07:08 PM EST RP Workstation: [id number]       Recent Labs    01/30/25 0447  WBC 4.7  HGB 9.6*  HCT 29.0*  PLT 274    Recent Labs    01/30/25 0447  NA 139  K 4.0  CL 103  CO2 28  GLUCOSE 132*  BUN 8  CREATININE 0.74  CALCIUM  8.5*     Intake/Output Summary (Last 24 hours) at 02/01/2025 0949 Last data filed at 02/01/2025 0723 Gross per 24 hour  Intake 596 ml  Output --  Net 596 ml          Physical Exam: Vital Signs Blood pressure (!) 120/56, pulse (!) 51, temperature 97.7 F (36.5 C), temperature source Oral, resp. rate 17, height 5' 9 (1.753 m), weight 115.2 kg, last menstrual period 10/28/2014, SpO2 97%.      General: awake, alert, appropriate, Supine in bed; appears more comfortable than yesterday;  NAD HENT: conjugate gaze; oropharynx moist CV: regular rhythm, bradycardic rate; no JVD Pulmonary: CTA B/L; no W/R/R- good air movement GI: firm, but less so than yesterday; slightly less distended; hypoactive BS Psychiatric: appropriate- appears more comfortable Neurological: Ox3  MSK: 2/4- RLE- 4 to 4+/5 in RLE   LLE- HF, KE, KF, DF and PF- all 4- to 4/5 2/3 LLE- 3+/5 in HF/KE/KF, which is slightly better, but DF/PF are still 2/5- at best.   Skin: posterior neck incision with surgical dressing c/d/I--not reassessed this morning MSK  2/2- LUE-  biceps/triceps 3+ to 4-/5;  WE 4-/5; Grip 2/5; FA 2-/5 LLE- 2/5 throughout- HF/KE/KF/DF/PF    PRIOR EXAMS:  Musculoskeletal:   Right shoulder still limited but improving IR/ER. 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 B/L Brisk Hoffman's B/L in Ue's DTR's reduced to 2+ in Ue's and LE's. Stable appearance 1/23  Assessment/Plan: 1. Functional deficits which require 3+ hours per day of interdisciplinary therapy in a comprehensive inpatient rehab setting. Physiatrist is providing close team supervision and 24 hour management of active medical problems listed below. Physiatrist and rehab team continue to assess barriers to discharge/monitor patient progress toward functional and medical goals  Care Tool:  Bathing    Body parts bathed by patient: Right arm, Left arm, Chest, Right upper leg, Left upper leg, Face   Body parts bathed by helper: Abdomen, Front perineal area, Buttocks, Right lower leg, Left lower leg     Bathing assist Assist Level: Maximal Assistance - Patient 24 - 49%  Upper Body Dressing/Undressing Upper body dressing   What is the patient wearing?: Hospital gown only    Upper body assist Assist Level: Minimal Assistance - Patient > 75%    Lower Body Dressing/Undressing Lower body dressing      What is the patient wearing?: Pants, Incontinence brief     Lower body assist Assist for lower body dressing: Contact Guard/Touching assist     Toileting Toileting    Toileting assist Assist for toileting: Supervision/Verbal cueing     Transfers Chair/bed transfer  Transfers assist     Chair/bed transfer assist level: Contact Guard/Touching assist      Locomotion Ambulation   Ambulation assist   Ambulation activity did not occur: Safety/medical concerns  Assist level: Contact Guard/Touching assist Assistive device: Walker-rolling Max distance: 130'   Walk 10 feet activity   Assist  Walk 10 feet activity did not occur: Safety/medical concerns  Assist level: Contact Guard/Touching assist Assistive device: Walker-rolling   Walk 50 feet activity   Assist Walk 50 feet with 2 turns activity did not occur: Safety/medical concerns  Assist level: Contact Guard/Touching assist Assistive device: Walker-rolling    Walk 150 feet activity   Assist Walk 150 feet activity did not occur: Safety/medical concerns         Walk 10 feet on uneven surface  activity   Assist Walk 10 feet on uneven surfaces activity did not occur: Safety/medical concerns         Wheelchair     Assist Is the patient using a wheelchair?: Yes Type of Wheelchair: Manual    Wheelchair assist level: Supervision/Verbal cueing Max wheelchair distance: 130'    Wheelchair 50 feet with 2 turns activity    Assist        Assist Level: Supervision/Verbal cueing   Wheelchair 150 feet activity     Assist      Assist Level: Dependent - Patient 0%   Blood pressure (!) 120/56, pulse (!) 51, temperature 97.7 F (36.5 C), temperature source Oral, resp. rate 17, height 5' 9 (1.753 m), weight 115.2 kg, last menstrual period 10/28/2014, SpO2 97%.  Medical Problem List and Plan: 1. 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 Supervision to min A with RW      Extended til 2/20- but looking better strength wise today  Con't CIR PT and OT  Reviewed Dr Joshua note- awaiting IR aspiration of seroma- notes has reviewed MRI with  2 other NSU's who agree that's not sure about significance unless infected.   -no organisms,  however rare WBC's- no growth so far - got 145cc Seroma aspiration.  2.  Antithrombotics: -DVT/anticoagulation:  Mechanical:  Antiembolism stockings, knee (TED hose) Bilateral lower extremities-- 01/14/25 DVT study neg -antiplatelet therapy:             -1/23 begin daily lovenox  40mg  daily   1/29- will need 1-2 months after d/c.   3. Pain Management: Baclofen  5 mg BID. Oxycodone  and robaxin  PRN.  -1/16- will increase Oxy to 10-15 mg q3 hours prn- since pain only went to 8/10 with pain meds- usually 9 or higher when meds wear off. -improved 1/17 -1/20 pt appears to have adhesive capsulitis of the right shoulder with referred pain. I suspect when she was working with therapy yesterday that she met resistance and pushed past it where she felt  grinding or popping as well as pain. We discussed the importance of improving her ROM and at least maintaining it.   2/2- rarely taking pain meds- only when really needs it- to help bowels 4. Mood/Behavior/Sleep: LCSW to follow for evaluation and support when available.              -antipsychotic agents: Ativan  0.5 mg bid prn   5. Neuropsych/cognition: This patient is capable of making decisions on her own behalf.  6. Skin/Wound Care: Routine pressure relief measures. Monitor incision sites for signs of infection. Hemo Vac removed 1/15.  1/23 --postop dressing soiled, loosening. New dry dressing place. Small open area at the superior aspect of the wound -01/29/24 MRI C-spine last night without spinal cord findings, but does show seroma-- weekday team can reach out to NSG to see if they'd like to drain this area or let it reabsorb (nonurgent, no evidence of infection) 2/2- is not Compressive- but pt feeling weaker- also per therapy on Friday- wasn't lower level of function-   2/3- NSU is following- I appreciate this-- pt at lower level of function-   2/4- Pt's strength looks better today- will  come off at day 5 if everything OK 7.  Fluids/Electrolytes/Nutrition: Monitor I&O and weight. Follow up labs CBC/CMP                -Carb modified diet with supplements to continue.   8. Cervical spinal stenosis: s/p laminectomy by Dr. Joshua 1/12. NSGRY following, continue on decadron  taper.  -01/29/25 pt making me aware that she's having more weakness-- in all extremities, but LLE more-- didn't mention this yesterday.   2/3- started back on Decadron  and getting seroma aspiration hopefully today-   2/4- strength much better- got 145cc removed form Seroma-  9. DM2: A1c 7.1. Holding home metformin , glipizide  and januvia .   -Hyperglycemia in the setting of steroids.  SSI. Novolog , Lantus  30 units daily. Novolog  7U with meals scheduled as well.  1/19 CBGs still pretty high although better this am.   increased Lantus  to 35U daily as decadron  tapers down.  1/23-decadron  1mg  every day for 3 days then off -sugars still quite high. Will bump lantus  to 38u daily. -continue same novolog  7u with meals  -01/21/25 CBGs doing well, cont regimen 1/26- BG's running 93-155- will monitor closely today since feeling ill/nauseated- don't want her dropping too low  1/27- BG's 140s-368- went up- x1- will check/monitor more closely.  1/30- back to regular diet- a little more labile, up to 197- con't to monitor/regimen -01/29/25-2/2 CBGs doing much better in last 48hrs; monitor 2/4- BG's 260's at max-  will cover with SSI- for now CBG (last 3)  Recent Labs    01/31/25 1635 01/31/25 2105 02/01/25 0555  GLUCAP 236* 261* 219*    10.CKD IIIa: Stable, baseline range 1.1-1.5.  .   1/16- Cr 0.8 and BUN 30- is dry- will push fluids- and recheck Monday 1/19 BUN up to 38---push fluids, she should be able to take in plenty on her own.  1/22 labs reviewed and consistent--f/u Monday   1/26 Cr 0.98 and BUN 21- slightly dry- will ask pt to push fluids  1/27- will order CMP tomorrow 1/28- Cr and BUN looking better- con't to monitor- will keep her on IVF's today since  not drinking a lot-   1/29- BUN 12 down from 19 and Cr 0.77- will stop IVFs this AM  2/2- BUN 8 and Cr 0.74  2/3- urine tea colored this  Am, but isn't dehydrated per labs  2/4- labs in AM- pt not drinking a lot- will push fluids 11. Acute bronchitis: CT from 1/1 without acute intrathoracic pathology. Completed 10 day course of doxycycline  1/15.               -encourage ins spiro.   12. HLD: Lipitor  80 mg   13. HTN: Holding metoprolol , lisinopril , triamterene -HCTZ. Monitor bp per protocol for need to resume.    -1/22-24 bp controlled 1/26- Pt's BP slightly soft, will monitor esp in setting of being slightly dry 1/29- BP a little elevated- 150's to 164 systolic- likely due ot increased fluids form IVF's- will monitor, because usually low 1/30- usually running 110-s 120's except AD episode today-  -01/29/25 BPs better, no further AD noted 2/3- Episode of BP 150's systolic this AM- will d/w team  2/4- Pt hadn't voided in 12+ hours when BP was up- will d/w pt more- is back to baseline Vitals:   01/28/25 1258 01/28/25 1950 01/29/25 0348 01/29/25 1439  BP: (!) 122/59 134/69 128/60 122/66   01/29/25 1952 01/30/25 0503 01/30/25 1903 01/30/25 2044  BP: 125/63 (!) 112/56 132/69 (!) 130/56   01/31/25 0508 01/31/25 1321 01/31/25 1930 02/01/25 0554  BP: (!) 150/67 (!) 133/59 130/63 (!) 120/56    14. Neurogenic bladder: Foley removed on 1/14  monitor PVRs d/t potential for urinary retention.  1/19 PVR's low/absent. Occasional incontinence  -timed emptying for now q4 while awake  2/4- pt not voiding regularly- will check bladder scans- she needs to be be scanned q6 hours- and cath if no void in 8 hours or volumes >350cc 15. Neurogenic bowel: Continue Senna bid.  -Will give Sorbitol  45 cc x1 this afternoon- if no BM today, will give 60cc tomorrow and SSE. -1/16- had huge BM on toilet- but still feels like could go- will increase Senna to 2 tabs BID since increasing bowel meds- and keep on Sorbitol  prn  if needed- d/c'd Bowel program for now since went on toilet- but might need to come back if starts having incontinence.  -01/14/25 LBM 1/15, pt stated no bowel program yesterday. Will restart suppository, continue bowel program, advised pt to use sorbitol  today before dinner as well to encourage better output tonight with program. Could see if she can d/c bowel program again later.-- 1/18 good output with bowel program so leave in place -1/23 had large liquid bm at 1300 yesterday   -will dc senna  -maintain PM bowel program  -01/21/25 liquid BM last night without bowel program-- previously constipated so hesitant to adjust meds, so pt will tell nursing staff to call us  if persistent loose stools. Leave meds as is for now.  1/26- will make suppository prn for now, but checking KUB to  make sure not backed up and only loose stool getting through, vs ileus?- \ 1/29- will order Mg citrate 1/2 bottle at 1pm and 1/2 bottle at 5pm- with nightly supp at 6pm -01/28/25 emptied out last night/this AM, cont regimen as is for now -01/29/25 LBM yesterday  2/2- small BM last night- will change suppository to Enemeez per d/w pt.   2/3- change Senna to in AM- for some reason was at 1600- also will have pt take lactulose  if needed- didn't get Enemeez last night- if no results tonight, will have pt take the lactulose .   2/4- no results- will  give Lactulose  if no BM with bowel program- told her to stop using straws- since a lot of gas-  16. Nausea and reflux--improved  -added protonix  daily--increased to 40mg  bid 1/22 with improvement  -prn tums, dc'ed maalox  1/26- added Maalox back prn just in case.   17. Questionable partial small bowel obstruction 1/27- ordered another KUB because want to make sure not getting worse- concerned,because pt feeling worse- ordered Phenergan - d/w nursing to get done ASAP- and pt on liquid diet now- decided due to Sx's being significantly worse, will place NGT via Fluoro today and  hopefully she will feel better.  1/28- removed NGT- since no output- is very constipated- 1 medium and 1 small BM- will do Sorbitol  60cc again and another SMOG enema to get stool that's moved down- maintain IVF's for 1 more day- - let nurse know can  do crackers but otherwise liquid diet.  1/29- changed back to regular diet by NP- still very constipated- had 1 large, 1 small BM- explained it's likely high up, so will give Mg citrate 1 full bottle over 2 times- and suppository at 6pm- since enema just came right back out  1/30- only 1 small BM yesterday- feeling much better- will add Lactulose  20cc prn daily- to take if pt needs it- d/w pt- also added Gas-x QID 80 mg -01/28/25 see #15, LBM this morning-- reports symptoms improved  2/3- LBM yesterday AM, but feeling more distended- will d/w NP 18. 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   2/4- PET scan ordered for early March after leaves 19. 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 Reviewed labs, vitals and B/B LOS: 20 days A FACE TO FACE EVALUATION WAS PERFORMED  Wrigley Winborne 02/01/2025, 9:49 AM     "

## 2025-02-01 NOTE — Progress Notes (Signed)
 Patient ID: April Warren, female   DOB: [birth year], 54 y.o.   MRN: [id number] Looks much better after aspiration of seroma, Cxs pending, L hand and foot drop much improved, she's in the gym smiling doing PT/OT.

## 2025-02-01 NOTE — Progress Notes (Signed)
 IP Rehab Bowel Program Documentation   Bowel Program Start time [phone]  Dig Stim Indicated? Yes  Dig Stim Prior to Suppository or mini Enema X 1   Output from dig stim: Minimal  Ordered intervention: Suppository No , mini enema Yes ,   Repeat dig stim after Suppository or Mini enema  X 2,  Output? Minimal   Bowel Program Complete? No , handoff given to oncoming nurse  Patient Tolerated? Yes

## 2025-02-01 NOTE — Progress Notes (Signed)
 Recreational Therapy Session Note  Patient Details  Name: April Warren MRN: [id number] Date of Birth: April 06, 1971 Today's Date: 02/01/2025  Pain: no c/o Pt participated in animal assisted activity supine on mat table at supervision level. Pt easily engaged with pet partner team and was appreciative of this visit.   Cace Osorto 02/01/2025, 3:56 PM

## 2025-02-01 NOTE — Progress Notes (Signed)
 Occupational Therapy Session Note  Patient Details  Name: April Warren MRN: [id number] Date of Birth: [birth year]  Today's Date: 02/01/2025 OT Individual Time: 0900-1015 OT Individual Time Calculation (min): 75 min    Short Term Goals: Week 3:  OT Short Term Goal 1 (Week 3): patient will complete toileting 3/3 CGA OT Short Term Goal 2 (Week 3): patient will complete LB bathing UB/LB SUP A OT Short Term Goal 3 (Week 3): Patient to increase AROM into R shoulder flexion to 150 degrees  Skilled Therapeutic Interventions/Progress Updates:      Therapy Documentation Precautions:  Precautions Precautions: Fall Recall of Precautions/Restrictions: Intact Precaution/Restrictions Comments: cervical Restrictions Weight Bearing Restrictions Per Provider Order: No General: Pt supine in bed upon OT arrival, agreeable to OT session.  Pain:  7/10 pain reported in Rt shoulder, activity, intermittent rest breaks, distractions provided for pain management, pt reports tolerable to proceed.   ADL:OT providing skilled intervention on ADL retraining in order to increase independence with tasks and increase activity tolerance. Pt completed the following tasks at the current level of assist: Bed mobility: Mod A for trunk elevation into sitting, pt able to manage BLE off of bed  Grooming/oral hygiene: SBA seated at W/C for oral hygiene and washing face, OT assisting with hair care UB dressing: Min A overhead, pt able to thread arms in sleeves LB dressing: Min A seated EOB using reacher, min for thoroughness on buttocks d/t ROM Transfers: CGA with RW stand step transfer from EOB>W/C and W/C> EOM  Exercises: Pt completed the following exercise circuit in order to improve functional activity, strength and endurance to prepare for ADLs such as bathing. Pt completed the following exercises in seated EOM position with no noted LOB/SOB and 3x10 repetitions on each exercise: -bicep curls -triceps  extensions -shoulder flexion -resistive knee flexion -toe taps onto cone -resistive knee extension   Pt seated on EOM with direct handoff to PT.    Therapy/Group: Individual Therapy  Camie Hoe, OTD, OTR/L 02/01/2025, 12:53 PM

## 2025-02-02 ENCOUNTER — Inpatient Hospital Stay (HOSPITAL_COMMUNITY)

## 2025-02-02 LAB — CBC
HCT: 30.6 % — ABNORMAL LOW (ref 36.0–46.0)
Hemoglobin: 10.2 g/dL — ABNORMAL LOW (ref 12.0–15.0)
MCH: 30.9 pg (ref 26.0–34.0)
MCHC: 33.3 g/dL (ref 30.0–36.0)
MCV: 92.7 fL (ref 80.0–100.0)
Platelets: 379 10*3/uL (ref 150–400)
RBC: 3.3 MIL/uL — ABNORMAL LOW (ref 3.87–5.11)
RDW: 12.7 % (ref 11.5–15.5)
WBC: 6.3 10*3/uL (ref 4.0–10.5)
nRBC: 0 % (ref 0.0–0.2)

## 2025-02-02 LAB — GLUCOSE, CAPILLARY
Glucose-Capillary: 230 mg/dL — ABNORMAL HIGH (ref 70–99)
Glucose-Capillary: 172 mg/dL — ABNORMAL HIGH (ref 70–99)
Glucose-Capillary: 223 mg/dL — ABNORMAL HIGH (ref 70–99)
Glucose-Capillary: 286 mg/dL — ABNORMAL HIGH (ref 70–99)

## 2025-02-02 LAB — BASIC METABOLIC PANEL WITH GFR
Anion gap: 8 (ref 5–15)
BUN: 24 mg/dL — ABNORMAL HIGH (ref 6–20)
CO2: 28 mmol/L (ref 22–32)
Calcium: 8.6 mg/dL — ABNORMAL LOW (ref 8.9–10.3)
Chloride: 102 mmol/L (ref 98–111)
Creatinine, Ser: 0.81 mg/dL (ref 0.44–1.00)
GFR, Estimated: >60 mL/min
Glucose, Bld: 233 mg/dL — ABNORMAL HIGH (ref 70–99)
Potassium: 4.9 mmol/L (ref 3.5–5.1)
Sodium: 138 mmol/L (ref 135–145)

## 2025-02-02 MED ORDER — SMOG ENEMA
960.0000 mL | Freq: Once | RECTAL | Status: AC
  Administered 2025-02-02: 960 mL via RECTAL
  Filled 2025-02-02: qty 960

## 2025-02-02 MED ORDER — SORBITOL 70 % SOLN
60.0000 mL | Freq: Once | Status: AC
  Administered 2025-02-02: 60 mL via ORAL
  Filled 2025-02-02: qty 60

## 2025-02-02 MED ORDER — POLYETHYLENE GLYCOL 3350 17 G PO PACK
17.0000 g | PACK | Freq: Two times a day (BID) | ORAL | Status: AC
  Administered 2025-02-03 (×2): 17 g via ORAL
  Filled 2025-02-02 (×3): qty 1

## 2025-02-02 MED ORDER — LACTULOSE 10 GM/15ML PO SOLN
30.0000 g | Freq: Once | ORAL | Status: AC
  Administered 2025-02-02: 30 g via ORAL
  Filled 2025-02-02: qty 45

## 2025-02-02 NOTE — Progress Notes (Signed)
 Occupational Therapy Session Note  Patient Details  Name: April Warren MRN: [id number] Date of Birth: 02/15/1971  Today's Date: 02/02/2025 OT Individual Time: 1040-1135 & 1515-1545 OT Individual Time Calculation (min): 55 min & 60 min    Short Term Goals: Week 3:  OT Short Term Goal 1 (Week 3): patient will complete toileting 3/3 CGA OT Short Term Goal 2 (Week 3): patient will complete LB bathing UB/LB SUP A OT Short Term Goal 3 (Week 3): Patient to increase AROM into R shoulder flexion to 150 degrees  Skilled Therapeutic Interventions/Progress Updates:      Therapy Documentation Precautions:  Precautions Precautions: Fall Recall of Precautions/Restrictions: Intact Precaution/Restrictions Comments: cervical Restrictions Weight Bearing Restrictions Per Provider Order: No Session 1 General:  Pt supine in bed upon OT arrival, agreeable to OT session.  Pain:  5/10 pain reported in Rt shoulder, activity, intermittent rest breaks, distractions provided for pain management, pt reports tolerable to proceed.   ADL: OT providing skilled intervention on ADL retraining in order to increase independence with tasks and increase activity tolerance. Pt completed the following tasks at the current level of assist: Bed mobility: Mod A for trunk elevation from supine>EOB, Min with HOB raised UB dressing: SBA overall with increased time, first time she has been able to complete fully! LB dressing: Min A, assistance for thoroughness on back  Footwear: total A donning socks seated EOB, OT educating pt on sock aide and long handle shoe horn, OT recommended for home use  Shower transfer: CGA ambulating from bed> TTB with RW,  no LOB/SOB Bathing: Min A d/t washing hair, pt able to complete all other aspects of bathing  Pt seated in W/C at end of session with call light within reach and 4Ps assessed.    Session 2 General: Pt seated in W/C upon OT arrival, agreeable to OT.  Pain:  5/10 pain reported  in Rt shoulder, activity, intermittent rest breaks, distractions provided for pain management, pt reports tolerable to proceed.   Exercises: Pt participated in [facility] activity with various items for increased dexterity and coordination in order to complete ADLs such as toileting. OT challenging pt to complete in hand manipulation skills such as finger to palm, palm to finger with large coin, simple rotation with screws and bolts and pt taking them on/off of screws with BUE, and manipulating resistive clothes pins. Pt with increased difficulty with higher resistance clothes pins, although pt did well with in hand manipulation skills, requiring SBA  Other Treatments: OT performing manual therapy on shoulders for decreased msucle tightness. OT using massage for upper traps and rhomboids. OT also educating pt on AD, symptoms, and what to do if experiencing symptoms. Pt understanding of information.   Pt seated in W/C at end of session with  call light within reach and 4Ps assessed.    Therapy/Group: Individual Therapy  Camie Hoe, OTD, OTR/L 02/02/2025, 5:26 PM

## 2025-02-02 NOTE — Progress Notes (Signed)
 Physical Therapy Session Note  Patient Details  Name: April Warren MRN: [id number] Date of Birth: 27-Aug-1971  Today's Date: 02/02/2025 PT Individual Time: 9099-9054 and 1305-1350 PT Individual Time Calculation (min): 45 min and 45 min  Short Term Goals: Week 3:  PT Short Term Goal 1 (Week 3): Pt will navigate stairs with assist x 4 PT Short Term Goal 2 (Week 3): Pt will ambulate 150 ft without w/c follow PT Short Term Goal 3 (Week 3): Pt will perform STS with supervision intermittently  Skilled Therapeutic Interventions/Progress Updates: Pt presented in bed agreeable to therapy. Pt states some mild pain in R knee, contributes to use of knee cage yesterday. Pt completed bed mobility with minA for truncal support and PTA threaded pants for time management. Pt stood with CGA and required minA to pull shorts over hips. Pt then completed stand step transfer to w/c. Pt transported to day room for time management. Participated in ambulation for endurance 51ft and 60ft with CGA, no w/c follow. With fatigue noted increased genu recurvatum on second bout. After seated rest pt ambulated to NuStep and participated in NuStep L1 x 4 min x 4 extremities. Pt then participated in hills program L4-9 LEs only for strengthening.   262 steps 24 SPM 1.6 METs Completed stand step transfer to w/c with CGA overall. Pt transported back to room and compelted stand step transfer to bed as transport awaiting to take pt to x-ray. Pt completed sit to supine with CGA and use of bed features. Pt left in bed with transport ready and current needs met.   Tx2: Pt presented in w/c agreeable to therapy. Pt indicated feeling nauseous as recently taken Sorbitol . No urgency for BM at this time. Pt agreeable to participate in lower level activities due to nausea. Pt tranpsorted to ortho gym and set up with UBE L1 x 6 min alternating 1 min forward/backwards. Pt then participated in seated therex as noted below.  3lb ankle weight RLE/2.5lb  LLE LAQ 2 x 10 Seated marches 2 x 10 Hip abd/add 2 x 10 Pt then ambulated with w/c follow 63ft with RW and CGA. Pt noted to have fair control of knee with present but more controlled genu recurvatum. Pt then handed off to [place], RT for next session with current needs met.        Therapy Documentation Precautions:  Precautions Precautions: Fall Recall of Precautions/Restrictions: Intact Precaution/Restrictions Comments: cervical Restrictions Weight Bearing Restrictions Per Provider Order: No   Therapy/Group: Individual Therapy  Sayward Horvath 02/02/2025, 12:31 PM

## 2025-02-02 NOTE — Progress Notes (Signed)
 "                                                        PROGRESS NOTE   Subjective/Complaints:  Pt reports still no BM- has been 4 days and last BM was small- feels constipated- has stopped using straws which has helped gas issue.  No nausea  Walked 80 ft yesterday with RW!  Per NSU (per pt not in note) that NSU felt Seroma was pressing more than we thought, being 145cc removed- I agree.   Seroma hasn't grown anything out- x 2 days.   ROS: per HPI   Pt denies SOB, less (+) abd pain, CP, N/V/ (+) C/D, and vision changes  Weakness L>R (+) moderate improvement per pt  Objective:   No results found.      Recent Labs    02/02/25 0516  WBC 6.3  HGB 10.2*  HCT 30.6*  PLT 379    Recent Labs    02/02/25 0516  NA 138  K 4.9  CL 102  CO2 28  GLUCOSE 233*  BUN 24*  CREATININE 0.81  CALCIUM  8.6*     Intake/Output Summary (Last 24 hours) at 02/02/2025 1220 Last data filed at 02/02/2025 0800 Gross per 24 hour  Intake 594 ml  Output 0 ml  Net 594 ml          Physical Exam: Vital Signs Blood pressure (!) 142/70, pulse (!) 51, temperature 97.7 F (36.5 C), temperature source Oral, resp. rate 16, height 5' 9 (1.753 m), weight 115.2 kg, last menstrual period 10/28/2014, SpO2 95%.       General: awake, alert, appropriate, supine in bed; NAD HENT: conjugate gaze; oropharynx- dry oropharynx CV: regular rhythm, bradycardic; no JVD Pulmonary: CTA B/L; no W/R/R- good air movement GI: still somewhat firm; but less distended than yesterday- like passing gas- still hypoactive BS Psychiatric: appropriate- more interactive; brighter Neurological: Ox3 MSK: Strength in LLE- stable from yesterday RLE- 4-4+ in all muscles in RLE  MSK: 2/4- RLE- 4 to 4+/5 in RLE   LLE- HF, KE, KF, DF and PF- all 4- to 4/5 2/3 LLE- 3+/5 in HF/KE/KF, which is slightly better, but DF/PF are still 2/5- at best.   Skin: posterior neck incision with surgical dressing c/d/I--not  reassessed this morning MSK  2/2- LUE-  biceps/triceps 3+ to 4-/5;  WE 4-/5; Grip 2/5; FA 2-/5 LLE- 2/5 throughout- HF/KE/KF/DF/PF    PRIOR EXAMS:  Musculoskeletal:  Right shoulder still limited but improving IR/ER. 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 B/L Brisk Hoffman's B/L in Ue's DTR's reduced to 2+ in Ue's and LE's. Stable appearance 1/23  Assessment/Plan: 1. Functional deficits which require 3+ hours per day of interdisciplinary therapy in a comprehensive inpatient rehab setting. Physiatrist is providing close team supervision and 24 hour management of active medical problems listed below.  Physiatrist and rehab team continue to assess barriers to discharge/monitor patient progress toward functional and medical goals  Care Tool:  Bathing    Body parts bathed by patient: Right arm, Left arm, Chest, Right upper leg, Left upper leg, Face   Body parts bathed by helper: Abdomen, Front perineal area, Buttocks, Right lower leg, Left lower leg     Bathing assist Assist Level: Maximal Assistance - Patient 24 - 49%     Upper Body Dressing/Undressing Upper body dressing   What is the patient wearing?: Hospital gown only    Upper body assist Assist Level: Minimal Assistance - Patient > 75%    Lower Body Dressing/Undressing Lower body dressing      What is the patient wearing?: Pants, Incontinence brief     Lower body assist Assist for lower body dressing: Contact Guard/Touching assist     Toileting Toileting    Toileting assist Assist for toileting: Supervision/Verbal  cueing     Transfers Chair/bed transfer  Transfers assist     Chair/bed transfer assist level: Contact Guard/Touching assist     Locomotion Ambulation   Ambulation assist   Ambulation activity did not occur: Safety/medical concerns  Assist level: Contact Guard/Touching assist Assistive device: Walker-rolling Max distance: 130'   Walk 10 feet activity   Assist  Walk 10 feet activity did not occur: Safety/medical concerns  Assist level: Contact Guard/Touching assist Assistive device: Walker-rolling   Walk 50 feet activity   Assist Walk 50 feet with 2 turns activity did not occur: Safety/medical concerns  Assist level: Contact Guard/Touching assist Assistive device: Walker-rolling    Walk 150 feet activity   Assist Walk 150 feet activity did not occur: Safety/medical concerns         Walk 10 feet on uneven surface  activity   Assist Walk 10 feet on uneven surfaces activity did not occur: Safety/medical concerns         Wheelchair     Assist Is the patient using a wheelchair?: Yes Type of Wheelchair: Manual    Wheelchair assist level: Supervision/Verbal cueing Max wheelchair distance: 130'    Wheelchair 50 feet with 2 turns activity    Assist        Assist Level: Supervision/Verbal cueing   Wheelchair 150 feet activity     Assist      Assist Level: Dependent - Patient 0%   Blood pressure (!) 142/70, pulse (!) 51, temperature 97.7 F (36.5 C), temperature source Oral, resp. rate 16, height 5' 9 (1.753 m), weight 115.2 kg, last menstrual period 10/28/2014, SpO2 95%.  Medical Problem List and Plan: 1. 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 Supervision to min A with RW      Extended til 2/20- but looking better strength wise today  Con't CIR PT and OT Walked 80 ft with RW I appreciate NSU monitoring  and assistance Will start to wean Decadron  tomorrow- might be able to d/c at earlier date?- No growth to date for 2 days now 2.  Antithrombotics: -DVT/anticoagulation:  Mechanical:  Antiembolism stockings, knee (TED hose) Bilateral lower extremities-- 01/14/25 DVT study neg -antiplatelet therapy:             -1/23 begin daily lovenox  40mg  daily   1/29- will need 1-2 months after d/c.   3. Pain Management: Baclofen  5 mg BID. Oxycodone   and robaxin  PRN.  -1/16- will increase Oxy to 10-15 mg q3 hours prn- since pain only went to 8/10 with pain meds- usually 9 or higher when meds wear off. -improved 1/17 -1/20 pt appears to have adhesive capsulitis of the right shoulder with referred pain. I suspect when she was working with therapy yesterday that she met resistance and pushed past it where she felt grinding or popping as well as pain. We discussed the importance of improving her ROM and at least maintaining it.   2/2- rarely taking pain meds- only when really needs it- to help bowels 4. Mood/Behavior/Sleep: LCSW to follow for evaluation and support when available.              -antipsychotic agents: Ativan  0.5 mg bid prn   5. Neuropsych/cognition: This patient is capable of making decisions on her own behalf.  6. Skin/Wound Care: Routine pressure relief measures. Monitor incision sites for signs of infection. Hemo Vac removed 1/15.  1/23 --postop dressing soiled, loosening. New dry dressing place. Small open area at the superior aspect of the wound -01/29/24 MRI C-spine last night without spinal cord findings, but does show seroma-- weekday team can reach out to NSG to see if they'd like to drain this area or let it reabsorb (nonurgent, no evidence of infection) 2/2- is not Compressive- but pt feeling weaker- also per therapy on Friday- wasn't lower level of function-   2/3- NSU is following- I appreciate this-- pt at lower level of function-   2/4- Pt's strength looks better today- will  come  off at day 5 if everything OK  2/5- Decided to wean Decadron  starting tomorrow since looks so much better 7. Fluids/Electrolytes/Nutrition: Monitor I&O and weight. Follow up labs CBC/CMP                -Carb modified diet with supplements to continue.   8. Cervical spinal stenosis: s/p laminectomy by Dr. Joshua 1/12. NSGRY following, continue on decadron  taper.  -01/29/25 pt making me aware that she's having more weakness-- in all extremities, but LLE more-- didn't mention this yesterday.   2/3- started back on Decadron  and getting seroma aspiration hopefully today-   2/4- strength much better- got 145cc removed form Seroma-  9. DM2: A1c 7.1. Holding home metformin , glipizide  and januvia .   -Hyperglycemia in the setting of steroids.  SSI. Novolog , Lantus  30 units daily. Novolog  7U with meals scheduled as well.  1/19 CBGs still pretty high although better this am.   increased Lantus  to 35U daily as decadron  tapers down.  1/23-decadron  1mg  every day for 3 days then off -sugars still quite high. Will bump lantus  to 38u daily. -continue same novolog  7u with meals  -01/21/25 CBGs doing well, cont regimen 1/26- BG's running 93-155- will monitor closely today since feeling ill/nauseated- don't want her dropping too low  1/27- BG's 140s-368- went up- x1- will check/monitor more closely.  1/30- back to regular diet- a little more labile, up to 197- con't to monitor/regimen -01/29/25-2/2 CBGs doing much better in last 48hrs; monitor 2/4- BG's 260's at max-  will cover with SSI- for now 2/5- will start to wean decadron  tomorrow-  CBG (last 3)  Recent Labs    02/01/25 2107 02/02/25 0524 02/02/25 1155  GLUCAP 235* 230* 172*    10.CKD IIIa: Stable, baseline range 1.1-1.5.  .   1/16- Cr 0.8 and BUN 30- is dry- will push fluids- and recheck Monday 1/19 BUN up to 38---push fluids, she should  be able to take in plenty on her own.  1/22 labs reviewed and consistent--f/u Monday   1/26 Cr 0.98 and BUN 21-  slightly dry- will ask pt to push fluids  1/27- will order CMP tomorrow 1/28- Cr and BUN looking better- con't to monitor- will keep her on IVF's today since not drinking a lot-   1/29- BUN 12 down from 19 and Cr 0.77- will stop IVFs this AM  2/2- BUN 8 and Cr 0.74  2/3- urine tea colored this Am, but isn't dehydrated per labs  2/4- labs in AM- pt not drinking a lot- will push fluids  2/5- Will recheck Saturday since BUN up to 24 and Cr 0.81 11. Acute bronchitis: CT from 1/1 without acute intrathoracic pathology. Completed 10 day course of doxycycline  1/15.               -encourage ins spiro.   12. HLD: Lipitor  80 mg   13. HTN: Holding metoprolol , lisinopril , triamterene -HCTZ. Monitor bp per protocol for need to resume.    -1/22-24 bp controlled 1/26- Pt's BP slightly soft, will monitor esp in setting of being slightly dry 1/29- BP a little elevated- 150's to 164 systolic- likely due ot increased fluids form IVF's- will monitor, because usually low 1/30- usually running 110-s 120's except AD episode today-  -01/29/25 BPs better, no further AD noted 2/3- Episode of BP 150's systolic this AM- will d/w team  2/4- Pt hadn't voided in 12+ hours when BP was up- will d/w pt more- is back to baseline 2/5- Bladder scans no really elevated- just not drinking enough- will ask pt to push fluids more-  Vitals:   01/29/25 1952 01/30/25 0503 01/30/25 1903 01/30/25 2044  BP: 125/63 (!) 112/56 132/69 (!) 130/56   01/31/25 0508 01/31/25 1321 01/31/25 1930 02/01/25 0554  BP: (!) 150/67 (!) 133/59 130/63 (!) 120/56   02/01/25 1517 02/01/25 1519 02/01/25 1936 02/02/25 0525  BP: 136/64 136/64 (!) 107/52 (!) 142/70    14. Neurogenic bladder: Foley removed on 1/14  monitor PVRs d/t potential for urinary retention.  1/19 PVR's low/absent. Occasional incontinence  -timed emptying for now q4 while awake  2/4- pt not voiding regularly- will check bladder scans- she needs to be be scanned q6 hours- and cath if no  void in 8 hours or volumes >350cc 15. Neurogenic bowel: Continue Senna bid.  -Will give Sorbitol  45 cc x1 this afternoon- if no BM today, will give 60cc tomorrow and SSE. -1/16- had huge BM on toilet- but still feels like could go- will increase Senna to 2 tabs BID since increasing bowel meds- and keep on Sorbitol  prn if needed- d/c'd Bowel program for now since went on toilet- but might need to come back if starts having incontinence.  -01/14/25 LBM 1/15, pt stated no bowel program yesterday. Will restart suppository, continue bowel program, advised pt to use sorbitol  today before dinner as well to encourage better output tonight with program. Could see if she can d/c bowel program again later.-- 1/18 good output with bowel program so leave in place -1/23 had large liquid bm at 1300 yesterday   -will dc senna  -maintain PM bowel program  -01/21/25 liquid BM last night without bowel program-- previously constipated so hesitant to adjust meds, so pt will tell nursing staff to call us  if persistent loose stools. Leave meds as is for now.  1/26- will make suppository prn for now, but checking KUB to  make sure not backed up  and only loose stool getting through, vs ileus?- \ 1/29- will order Mg citrate 1/2 bottle at 1pm and 1/2 bottle at 5pm- with nightly supp at 6pm -01/28/25 emptied out last night/this AM, cont regimen as is for now -01/29/25 LBM yesterday  2/2- small BM last night- will change suppository to Enemeez per d/w pt.   2/3- change Senna to in AM- for some reason was at 1600- also will have pt take lactulose  if needed- didn't get Enemeez last night- if no results tonight, will have pt take the lactulose .   2/4- no results- will  give Lactulose  if no BM with bowel program- told her to stop using straws- since a lot of gas-   2/5- didn't take Lactulose  last night- ordered for this AM- no BM yet as of 12:30 pm- ordered KUB- hasn't been read, however read independently- is full of gas and stool-  will give Sorbitol  60cc as well and then see if can give SMOG enema again- d/w nursing.   16. Nausea and reflux--improved  -added protonix  daily--increased to 40mg  bid 1/22 with improvement  -prn tums, dc'ed maalox  1/26- added Maalox back prn just in case.   17. Questionable partial small bowel obstruction 1/27- ordered another KUB because want to make sure not getting worse- concerned,because pt feeling worse- ordered Phenergan - d/w nursing to get done ASAP- and pt on liquid diet now- decided due to Sx's being significantly worse, will place NGT via Fluoro today and hopefully she will feel better.  1/28- removed NGT- since no output- is very constipated- 1 medium and 1 small BM- will do Sorbitol  60cc again and another SMOG enema to get stool that's moved down- maintain IVF's for 1 more day- - let nurse know can  do crackers but otherwise liquid diet.  1/29- changed back to regular diet by NP- still very constipated- had 1 large, 1 small BM- explained it's likely high up, so will give Mg citrate 1 full bottle over 2 times- and suppository at 6pm- since enema just came right back out  1/30- only 1 small BM yesterday- feeling much better- will add Lactulose  20cc prn daily- to take if pt needs it- d/w pt- also added Gas-x QID 80 mg -01/28/25 see #15, LBM this morning-- reports symptoms improved  2/3- LBM yesterday AM, but feeling more distended- will d/w NP  2/5- ordered SMOG and Lactulose  AND Sorbitol -  18. 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   2/4- PET scan ordered for early March after leaves 19. 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) 1/30- pt had episode of AD today with BP up to 174 systolic- came down with pain meds; cooler room and voiding (bladder scan 123cc afterwards) and loosening clothing- down to 117  systolic- will educate pt more on AD  2/3- BP was 150's this AM- will d/w team  2/4- probably due to lack of voiding- will d/w team   I spent a total of  54  minutes on total care today- >50% coordination of care- due to  D/w pt and nursing and NP- increased miralax , added Lactulose /Sorbitol  and SMOG enema for today after going over independent review of KUB- - also went over with NSU-reviewed notes and strength exam- .    LOS: 21 days A FACE TO FACE EVALUATION  WAS PERFORMED  Krishon Adkison 02/02/2025, 12:20 PM     "

## 2025-02-02 NOTE — Progress Notes (Signed)
 Patient ID: April Warren, female   DOB: July 29, 1971, 54 y.o.   MRN: [id number] Looks good today.  Much more comfortable.  Hand and leg much better.  She is pleased.

## 2025-02-02 NOTE — Progress Notes (Signed)
 Recreational Therapy Session Note  Patient Details  Name: April Warren MRN: [id number] Date of Birth: [birth year] Today's Date: 02/02/2025  Pain: c/o stomach discomfort, occasional nausea due to ongoing constipation, nursing aware & addressing Skilled Therapeutic Interventions/Progress Updates: Session focused on emotional support and discharge planning.  Pt excited about improved mobility this week and today, but concerned about lack of BM and what treatment is warranted.  Pt requested to use BSC in the hope of having a BM.  Returned to the room and pt transferred from w/c to [facility] with RW with CGA.  Pt able to manipulate clothing with Mod I. Jeiry Birnbaum 02/02/2025, 3:44 PM

## 2025-02-02 NOTE — Plan of Care (Signed)
 Patient calm and cooperative A&O X4. Patient transferred to bedside commode 2 person assist. Digital maintenance completed, patient did not have a bowel movement this shift.   Problem: Consults Goal: RH SPINAL CORD INJURY PATIENT EDUCATION Description:  See Patient Education module for education specifics.  Outcome: Progressing   Problem: SCI BOWEL ELIMINATION Goal: RH STG MANAGE BOWEL WITH ASSISTANCE Description: STG Manage Bowel with bowel program min Assistance. Outcome: Progressing   Problem: SCI BLADDER ELIMINATION Goal: RH STG MANAGE BLADDER WITH ASSISTANCE Description: STG Manage Bladder With bladder program min Assistance Outcome: Progressing   Problem: RH PAIN MANAGEMENT Goal: RH STG PAIN MANAGED AT OR BELOW PT'S PAIN GOAL Description: <4 w/ prns Outcome: Progressing   Problem: RH KNOWLEDGE DEFICIT SCI Goal: RH STG INCREASE KNOWLEDGE OF SELF CARE AFTER SCI Description: Manage increase knowledge of self care with min assistance from spouse using educational materials provided Outcome: Progressing

## 2025-02-02 NOTE — Progress Notes (Signed)
 Physical Therapy Session Note  Patient Details  Name: April Warren MRN: [id number] Date of Birth: 05/29/1971  Today's Date: 02/02/2025 PT Individual Time: 9182-9155 PT Individual Time Calculation (min): 27 min   Short Term Goals: Week 3:  PT Short Term Goal 1 (Week 3): Pt will navigate stairs with assist x 4 PT Short Term Goal 2 (Week 3): Pt will ambulate 150 ft without w/c follow PT Short Term Goal 3 (Week 3): Pt will perform STS with supervision intermittently  Skilled Therapeutic Interventions/Progress Updates:      Treatment Session   Pt supine in bed upon arrival. Pt agreeable to therapy. Pt endorsing 6/10 pain in her neck and across her shoulders. Nurse present to administer medication.    PT present to make up missed minutes.   Treatment session focused on increasing independence with bed mobility and pressure relief to buttocks when in bed. Pt performed rolling to L with min A for hips and to R with mod A, verbal cues provided for log roll technique. Pt able to sustain for 3 min on each side with use of bed rail and supervision.   2x10 glute bridge 5 hold  2x15 hooklying knee fall outs  Pt supine in bed at end of session with all needs within reach and bed alarm on.         Therapy Documentation Precautions:  Precautions Precautions: Fall Recall of Precautions/Restrictions: Intact Precaution/Restrictions Comments: cervical Restrictions Weight Bearing Restrictions Per Provider Order: No  Therapy/Group: Individual Therapy  [facility] Doreene Orris, [place], DPT  02/02/2025, 8:19 AM

## 2025-02-03 LAB — GLUCOSE, CAPILLARY
Glucose-Capillary: 222 mg/dL — ABNORMAL HIGH (ref 70–99)
Glucose-Capillary: 170 mg/dL — ABNORMAL HIGH (ref 70–99)
Glucose-Capillary: 596 mg/dL (ref 70–99)
Glucose-Capillary: 300 mg/dL — ABNORMAL HIGH (ref 70–99)
Glucose-Capillary: 233 mg/dL — ABNORMAL HIGH (ref 70–99)

## 2025-02-03 LAB — BODY FLUID CULTURE W GRAM STAIN: Culture: NO GROWTH

## 2025-02-03 LAB — FUNGUS CULTURE WITH STAIN

## 2025-02-03 LAB — FUNGUS CULTURE RESULT

## 2025-02-03 LAB — GLUCOSE, RANDOM: Glucose, Bld: 297 mg/dL — ABNORMAL HIGH (ref 70–99)

## 2025-02-03 MED ORDER — DEXAMETHASONE 4 MG PO TABS: 4.0000 mg | ORAL_TABLET | Freq: Every day | ORAL | Status: AC

## 2025-02-03 MED ORDER — INSULIN ASPART 100 UNIT/ML IJ SOLN
0.0000 [IU] | Freq: Every day | INTRAMUSCULAR | Status: AC
  Administered 2025-02-03: 2 [IU] via SUBCUTANEOUS
  Filled 2025-02-03: qty 2

## 2025-02-03 MED ORDER — DEXAMETHASONE 4 MG PO TABS: 4.0000 mg | ORAL_TABLET | Freq: Two times a day (BID) | ORAL | Status: AC

## 2025-02-03 MED ORDER — DEXAMETHASONE 4 MG PO TABS
4.0000 mg | ORAL_TABLET | Freq: Three times a day (TID) | ORAL | Status: AC
  Administered 2025-02-03 (×2): 4 mg via ORAL
  Filled 2025-02-03 (×2): qty 1

## 2025-02-03 MED ORDER — INSULIN ASPART 100 UNIT/ML IJ SOLN: 0.0000 [IU] | Freq: Three times a day (TID) | INTRAMUSCULAR | Status: AC

## 2025-02-03 MED ORDER — ENOXAPARIN SODIUM 60 MG/0.6ML IJ SOSY: 60.0000 mg | PREFILLED_SYRINGE | INTRAMUSCULAR | Status: AC

## 2025-02-03 NOTE — Progress Notes (Signed)
 Physical Therapy Session Note  Patient Details  Name: April Warren MRN: [id number] Date of Birth: 05-26-1971  Today's Date: 02/03/2025 PT Individual Time: 1100-1200 and 1445-1530 PT Individual Time Calculation (min): 60 min and 45 min  Short Term Goals: Week 3:  PT Short Term Goal 1 (Week 3): Pt will navigate stairs with assist x 4 PT Short Term Goal 2 (Week 3): Pt will ambulate 150 ft without w/c follow PT Short Term Goal 3 (Week 3): Pt will perform STS with supervision intermittently  Skilled Therapeutic Interventions/Progress Updates: Tx1: Pt presented in w/c agreeable to therapy. Pt c/o pain in R shoulder, unrated with rest breaks provided as needed. Pt transported to main gym for time management. Particiapted in ambulation without w/c follow for BLE strengthening and endurance. Pt was able to ambulate ~133ft with RW and CGA. Pt with improving knee stability however noted increased genu recurvatum towards end distance. Pt then worked on step ups on 3in step with CGA in RLE for strengthening. Pt was also able to tolerate side step ups x 10. Pt ambulated an additional 38ft in same manner as prior towards day room. Pt transported remaining distance to day room and remainder of session participated in dance group. Dance group incorporated AROM x 4 extremities and pt was able to perform some activities in standing as well. Pt taken back to room at end of session and remained in w/c with call bell within reach and needs met.   Tx2: Pt presented in dayroom in w/c agreeable to therapy. Pt c/o UE weakness however also noted that recently participated in OT session with emphasis on reaching tasks. Pt participated in the following seated therex  activities for general strengthening.  Heel lifts 3 x 15 RLE 3lb/LLE 2.5 ankle weights LAQ 3 x 10 Seated marches 3 x 12 Hip abd/add AROM only 3 x 10  Pt then ambulated to mat and participated in Sit to stand from 23in mat without AD holding 1lb dowel for balance  challenge and increased posterior chain LE ms recruitment x 10. Pt initially requiring minA to maintain balance however improved to CGA and noted improving ankle strategy. Pt also worked on standing reaching task placing clothespins on basketball net without AD with CGA. Pt then c/o increasing shoulder neck pain therefore activity deferred. Discussed use of any pain meds/ms relaxer which pt states hadn't taken any all day. Discussed using meds as necessary to say ahead of pain when performing activities with pt verbalizing understanding, nsg notified requesting ms relaxer. Pt then ambulated back to w/c and participated in Cybex Kinetron x 6 min at 40cm/sec for general conditioning. Pt transported back to room and requesting to use bathroom. Performed ambulatory transfer to bathroom with CGA for clothing management (continent urinary void). Pt required minA to pull up shorts but was able to manage pulling over hips with CGA. Pt then ambulated back to recliner and PTA propped UEs with pillows to decrease load on upper traps. Pt left in recliner at end of session with call bell within reach and needs met.       Therapy Documentation Precautions:  Precautions Precautions: Fall Recall of Precautions/Restrictions: Intact Precaution/Restrictions Comments: cervical Restrictions Weight Bearing Restrictions Per Provider Order: No   Therapy/Group: Individual Therapy  Raiza Kiesel 02/03/2025, 12:54 PM

## 2025-02-03 NOTE — Progress Notes (Signed)
 "                                                        PROGRESS NOTE   Subjective/Complaints:  Pt reports had 4-5 large BM's yesterday with enema, however exhausted from the  enema and results. Feels like still has significant stool inside her, but is better.   Not as bloated as she was but not resolved.   Massage of shoulders was helpful by OT but asking about trp injections that OT wanted pt to discuss with me.  Still having pain in shoulders and neck B/L  Doesn't want to have another enema tonight- wants to wait a day at least.   ROS: per HPI   Pt denies SOB, abd pain, CP, N/V/ less (+)C/D, and vision changes   Weakness L>R (+) moderate improvement per pt  Objective:   DG Abd 1 View Result Date: 02/02/2025 CLINICAL DATA:  Constipation EXAM: ABDOMEN - 1 VIEW COMPARISON:  Abdominal radiograph dated 01/24/2025 FINDINGS: Nonobstructive bowel gas pattern. Persistent gas-filled dilation of the colon. Slightly decreased moderate volume stool projects over the ascending colon. Small to moderate volume stool within the transverse and descending colon. No free air or pneumatosis. No abnormal radio-opaque calculi or mass effect. No acute or substantial osseous abnormality. The sacrum and coccyx are partially obscured by overlying bowel contents. IMPRESSION: Persistent gas-filled dilation of the colon, likely ileus. Slightly decreased moderate volume stool projects over the ascending colon. Small to moderate volume stool within the transverse and descending colon. Electronically Signed   By: Limin  Xu M.D.   On: 02/02/2025 14:50        Recent Labs    02/02/25 0516  WBC 6.3  HGB 10.2*  HCT 30.6*  PLT 379    Recent Labs    02/02/25 0516  NA 138  K 4.9  CL 102  CO2 28  GLUCOSE 233*  BUN 24*  CREATININE 0.81  CALCIUM  8.6*     Intake/Output Summary (Last 24 hours) at 02/03/2025 0829 Last data filed at 02/02/2025 1800 Gross per 24 hour  Intake 590 ml  Output --  Net 590 ml           Physical Exam: Vital Signs Blood pressure (!) 121/58, pulse (!) 48, temperature 97.7 F (36.5 C), temperature source Oral, resp. rate 17, height 5' 9 (1.753 m), weight 115.2 kg, last menstrual period 10/28/2014, SpO2 95%.        General: awake, alert, appropriate, woke her up; supine in bed;  NAD HENT: conjugate gaze; oropharynx dry CV: regular rhythm pretty bradycardic rate; no JVD Pulmonary: CTA B/L; no W/R/R- some signs of hypoventilation GI: softer still slightly firm, but better; NT; less distension; hypoactive BS Psychiatric: appropriate- sleepy/tired Neurological: Ox3 Tight trp's in upper trap and esp scalenes B/L  MSK: Strength in LLE- stable from yesterday RLE- 4-4+ in all muscles in RLE  MSK: 2/4- RLE- 4 to 4+/5 in RLE   LLE- HF, KE, KF, DF and PF- all 4- to 4/5 2/3 LLE- 3+/5 in HF/KE/KF, which is slightly better, but DF/PF are still 2/5- at best.   Skin: posterior neck incision with surgical dressing c/d/I--not reassessed this morning MSK  2/2- LUE-  biceps/triceps 3+ to 4-/5;  WE 4-/5; Grip 2/5; FA 2-/5 LLE- 2/5 throughout- HF/KE/KF/DF/PF  PRIOR EXAMS:  Musculoskeletal:  Right shoulder still limited but improving IR/ER. 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 B/L Brisk Hoffman's B/L in Ue's DTR's reduced to 2+ in Ue's and LE's. Stable appearance 1/23  Assessment/Plan: 1. Functional deficits which require 3+ hours per day of interdisciplinary therapy in a comprehensive inpatient rehab setting. Physiatrist is providing close team supervision and  24 hour management of active medical problems listed below. Physiatrist and rehab team continue to assess barriers to discharge/monitor patient progress toward functional and medical goals  Care Tool:  Bathing    Body parts bathed by patient: Right arm, Left arm, Chest, Right upper leg, Left upper leg, Face   Body parts bathed by helper: Abdomen, Front perineal area, Buttocks, Right lower leg, Left lower leg     Bathing assist Assist Level: Maximal Assistance - Patient 24 - 49%     Upper Body Dressing/Undressing Upper body dressing   What is the patient wearing?: Hospital gown only    Upper body assist Assist Level: Minimal Assistance - Patient > 75%    Lower Body Dressing/Undressing Lower body dressing      What is the patient wearing?: Pants, Incontinence brief     Lower body assist Assist for lower body dressing: Contact Guard/Touching assist     Toileting Toileting    Toileting assist Assist for toileting: Supervision/Verbal cueing     Transfers Chair/bed transfer  Transfers assist     Chair/bed transfer assist level: Contact Guard/Touching assist     Locomotion Ambulation   Ambulation assist   Ambulation activity did not occur: Safety/medical concerns  Assist level: Contact Guard/Touching assist Assistive device: Walker-rolling Max distance: 130'   Walk 10 feet activity   Assist  Walk 10 feet activity did not occur: Safety/medical concerns  Assist level: Contact Guard/Touching assist Assistive device: Walker-rolling   Walk 50 feet activity   Assist Walk 50 feet with 2 turns activity did not occur: Safety/medical concerns  Assist level: Contact Guard/Touching assist Assistive device: Walker-rolling    Walk 150 feet activity   Assist Walk 150 feet activity did not occur: Safety/medical concerns         Walk 10 feet on uneven surface  activity   Assist Walk 10 feet on uneven surfaces activity did not occur: Safety/medical  concerns         Wheelchair     Assist Is the patient using a wheelchair?: Yes Type of Wheelchair: Manual    Wheelchair assist level: Supervision/Verbal cueing Max wheelchair distance: 130'    Wheelchair 50 feet with 2 turns activity    Assist        Assist Level: Supervision/Verbal cueing   Wheelchair 150 feet activity     Assist      Assist Level: Dependent - Patient 0%   Blood pressure (!) 121/58, pulse (!) 48, temperature 97.7 F (36.5 C), temperature source Oral, resp. rate 17, height 5' 9 (1.753 m), weight 115.2 kg, last menstrual period 10/28/2014, SpO2 95%.  Medical Problem List and Plan:  1. 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 Supervision to min A with RW      Extended til 2/20- but looking better strength wise today I appreciate NSU monitoring and assistance  might be able to d/c at earlier date?- No growth to date for 2 days now Con't CIR PT and OT- tired form so many stools last night and enema.  Started wean of Decadron  4 mg q8H x 3 days, then q12 hours x 3 days, then daily x3 days- then stop 2.  Antithrombotics: -DVT/anticoagulation:  Mechanical:  Antiembolism stockings, knee (TED hose) Bilateral lower extremities-- 01/14/25 DVT study neg -antiplatelet therapy:             -1/23 begin daily lovenox  40mg  daily   1/29- will need 1-2 months after d/c.   3. Pain Management: Baclofen  5 mg BID. Oxycodone  and robaxin  PRN.  -1/16- will increase Oxy to 10-15 mg q3 hours prn- since pain only went to 8/10 with pain meds- usually 9 or higher when meds wear off. -improved 1/17 -1/20 pt appears to have adhesive capsulitis of the right shoulder with referred pain. I suspect when she was working with therapy yesterday that she met resistance and pushed past it where she felt grinding or popping as well as pain. We discussed the  importance of improving her ROM and at least maintaining it.   2/2- rarely taking pain meds- only when really needs it- to help bowels  2/6- will do trP injections on Tuesday- also d/w pt about scalene pressure to relax those muscles. Demonstrated to pt.  4. Mood/Behavior/Sleep: LCSW to follow for evaluation and support when available.              -antipsychotic agents: Ativan  0.5 mg bid prn   5. Neuropsych/cognition: This patient is capable of making decisions on her own behalf.  6. Skin/Wound Care: Routine pressure relief measures. Monitor incision sites for signs of infection. Hemo Vac removed 1/15.  1/23 --postop dressing soiled, loosening. New dry dressing place. Small open area at the superior aspect of the wound -01/29/24 MRI C-spine last night without spinal cord findings, but does show seroma-- weekday team can reach out to NSG to see if they'd like to drain this area or let it reabsorb (nonurgent, no evidence of infection) 2/2- is not Compressive- but pt feeling weaker- also per therapy on Friday- wasn't lower level of function-   2/3- NSU is following- I appreciate this-- pt at lower level of function-   2/4- Pt's strength looks better today- will  come off at day 5 if everything OK  2/5- Decided to wean Decadron  starting tomorrow since looks so much better  2/6- weaning decadron  to 4 mg q8H x 3 days, then q12H x 3 days then Qday for 3 days then stop 7. Fluids/Electrolytes/Nutrition: Monitor I&O and weight. Follow up labs CBC/CMP                -Carb modified diet with supplements to continue.   8. Cervical spinal stenosis: s/p laminectomy by Dr. Joshua 1/12. NSGRY following, continue on decadron  taper.  -01/29/25 pt making me aware that she's having more weakness-- in all extremities, but LLE more-- didn't mention this yesterday.   2/3- started back on Decadron  and getting seroma aspiration hopefully today-   2/4- strength much better- got 145cc  removed from Seroma-  9. DM2: A1c  7.1. Holding home metformin , glipizide  and januvia .   -Hyperglycemia in the setting of steroids.  SSI. Novolog , Lantus  30 units daily. Novolog  7U with meals scheduled as well.  1/19 CBGs still pretty high although better this am.   increased Lantus  to 35U daily as decadron  tapers down.  1/23-decadron  1mg  every day for 3 days then off -sugars still quite high. Will bump lantus  to 38u daily. -continue same novolog  7u with meals  -01/21/25 CBGs doing well, cont regimen 1/26- BG's running 93-155- will monitor closely today since feeling ill/nauseated- don't want her dropping too low  1/27- BG's 140s-368- went up- x1- will check/monitor more closely.  1/30- back to regular diet- a little more labile, up to 197- con't to monitor/regimen -01/29/25-2/2 CBGs doing much better in last 48hrs; monitor 2/4- BG's 260's at max-  will cover with SSI- for now 2/5- will start to wean decadron  tomorrow-  2/6- Weaning as of today- should get better CBG (last 3)  Recent Labs    02/02/25 1703 02/02/25 2143 02/03/25 0612  GLUCAP 223* 286* 222*    10.CKD IIIa: Stable, baseline range 1.1-1.5.  .   1/16- Cr 0.8 and BUN 30- is dry- will push fluids- and recheck Monday 1/19 BUN up to 38---push fluids, she should be able to take in plenty on her own.  1/22 labs reviewed and consistent--f/u Monday   1/26 Cr 0.98 and BUN 21- slightly dry- will ask pt to push fluids  1/27- will order CMP tomorrow 1/28- Cr and BUN looking better- con't to monitor- will keep her on IVF's today since not drinking a lot-   1/29- BUN 12 down from 19 and Cr 0.77- will stop IVFs this AM  2/2- BUN 8 and Cr 0.74  2/3- urine tea colored this Am, but isn't dehydrated per labs  2/4- labs in AM- pt not drinking a lot- will push fluids  2/5- Will recheck Saturday since BUN up to 24 and Cr 0.81 11. Acute bronchitis: CT from 1/1 without acute intrathoracic pathology. Completed 10 day course of doxycycline  1/15.               -encourage ins spiro.    12. HLD: Lipitor  80 mg   13. HTN: Holding metoprolol , lisinopril , triamterene -HCTZ. Monitor bp per protocol for need to resume.    -1/22-24 bp controlled 1/26- Pt's BP slightly soft, will monitor esp in setting of being slightly dry 1/29- BP a little elevated- 150's to 164 systolic- likely due ot increased fluids form IVF's- will monitor, because usually low 1/30- usually running 110-s 120's except AD episode today-  -01/29/25 BPs better, no further AD noted 2/3- Episode of BP 150's systolic this AM- will d/w team  2/4- Pt hadn't voided in 12+ hours when BP was up- will d/w pt more- is back to baseline 2/5- Bladder scans no really elevated- just not drinking enough- will ask pt to push fluids more-  Vitals:   01/30/25 2044 01/31/25 0508 01/31/25 1321 01/31/25 1930  BP: (!) 130/56 (!) 150/67 (!) 133/59 130/63   02/01/25 0554 02/01/25 1517 02/01/25 1519 02/01/25 1936  BP: (!) 120/56 136/64 136/64 (!) 107/52   02/02/25 0525 02/02/25 1552 02/02/25 1952 02/03/25 0444  BP: (!) 142/70 125/68 135/71 (!) 121/58    14. Neurogenic bladder: Foley removed on 1/14  monitor PVRs d/t potential for urinary retention.  1/19 PVR's low/absent. Occasional incontinence  -timed emptying for now q4 while awake  2/4- pt not voiding regularly- will check bladder scans- she needs to be be scanned q6 hours- and cath if no void in 8 hours or volumes >350cc 15. Neurogenic bowel: Continue Senna bid.  -Will give Sorbitol  45 cc x1 this afternoon- if no BM today, will give 60cc tomorrow and SSE. -1/16- had huge BM on toilet- but still feels like could go- will increase Senna to 2 tabs BID since increasing bowel meds- and keep on Sorbitol  prn if needed- d/c'd Bowel program for now since went on toilet- but might need to come back if starts having incontinence.   2/2- small BM last night- will change suppository to Enemeez per d/w pt.   2/3- change Senna to in AM- for some reason was at 1600- also will have pt take  lactulose  if needed- didn't get Enemeez last night- if no results tonight, will have pt take the lactulose .   2/4- no results- will  give Lactulose  if no BM with bowel program- told her to stop using straws- since a lot of gas-   2/5- didn't take Lactulose  last night- ordered for this AM- no BM yet as of 12:30 pm- ordered KUB- hasn't been read, however read independently- is full of gas and stool- will give Sorbitol  60cc as well and then see if can give SMOG enema again- d/w nursing.    2/6- at least 4-5 large Bms after SMOG enema- but feels like needs to have more- will reorder Lactulose  30ml on Saturday or Sunday in early AM followed by SMOG at 6pm.  16. Nausea and reflux--improved  -added protonix  daily--increased to 40mg  bid 1/22 with improvement  -prn tums, dc'ed maalox  1/26- added Maalox back prn just in case.   17. Questionable partial small bowel obstruction 1/27- ordered another KUB because want to make sure not getting worse- concerned,because pt feeling worse- ordered Phenergan - d/w nursing to get done ASAP- and pt on liquid diet now- decided due to Sx's being significantly worse, will place NGT via Fluoro today and hopefully she will feel better.  1/28- removed NGT- since no output- is very constipated- 1 medium and 1 small BM- will do Sorbitol  60cc again and another SMOG enema to get stool that's moved down- maintain IVF's for 1 more day- - let nurse know can  do crackers but otherwise liquid diet.  1/29- changed back to regular diet by NP- still very constipated- had 1 large, 1 small BM- explained it's likely high up, so will give Mg citrate 1 full bottle over 2 times- and suppository at 6pm- since enema just came right back out  1/30- only 1 small BM yesterday- feeling much better- will add Lactulose  20cc prn daily- to take if pt needs it- d/w pt- also added Gas-x QID 80 mg -01/28/25 see #15, LBM this morning-- reports symptoms improved  2/3- LBM yesterday AM, but feeling more  distended- will d/w NP  2/5- ordered SMOG and Lactulose  AND Sorbitol -   2/6- doing much better- 4-5 large BM's per pt 18. 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   2/4- PET scan ordered for early March after leaves 19. 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 Bradycardia  2/6- wa son B blocker at home, but HR running high 40's to 50's likely due to SCI- will monitor to make sure not symptomatic- if so, will call Cards.     I spent a total of 51   minutes on total care today- >50% coordination of care- due to  D/w pt in depth about bowels, drinking; as well as trp injections will need Tuesday and demonstrated trp work to work on herself- also d/w NP and pt's nurse- reviewed B/B and vitals- esp new bradycardia  LOS: 22 days A FACE TO FACE EVALUATION WAS PERFORMED  April Warren 02/03/2025, 8:29 AM     "

## 2025-02-03 NOTE — Plan of Care (Signed)
" °  Problem: Consults Goal: RH SPINAL CORD INJURY PATIENT EDUCATION Description:  See Patient Education module for education specifics.  Outcome: Progressing   Problem: SCI BLADDER ELIMINATION Goal: RH STG MANAGE BLADDER WITH ASSISTANCE Description: STG Manage Bladder With bladder program min Assistance Outcome: Progressing   Problem: RH SKIN INTEGRITY Goal: RH STG SKIN FREE OF INFECTION/BREAKDOWN Description: Manage skin free of infection with min assistance Outcome: Progressing   Problem: RH SAFETY Goal: RH STG ADHERE TO SAFETY PRECAUTIONS W/ASSISTANCE/DEVICE Description: STG Adhere to Safety Precautions With min Assistance/Device. Outcome: Progressing   Problem: RH PAIN MANAGEMENT Goal: RH STG PAIN MANAGED AT OR BELOW PT'S PAIN GOAL Description: <4 w/ prns Outcome: Progressing   Problem: RH KNOWLEDGE DEFICIT SCI Goal: RH STG INCREASE KNOWLEDGE OF SELF CARE AFTER SCI Description: Manage increase knowledge of self care with min assistance from spouse using educational materials provided Outcome: Progressing   "

## 2025-02-03 NOTE — Progress Notes (Signed)
 Occupational Therapy Session Note  Patient Details  Name: April Warren MRN: [id number] Date of Birth: 05-12-1971  Today's Date: 02/03/2025 OT Individual Time: 1345-1430 OT Individual Time Calculation (min): 45 min    Short Term Goals: Week 3:  OT Short Term Goal 1 (Week 3): patient will complete toileting 3/3 CGA OT Short Term Goal 2 (Week 3): patient will complete LB bathing UB/LB SUP A OT Short Term Goal 3 (Week 3): Patient to increase AROM into R shoulder flexion to 150 degrees  Skilled Therapeutic Interventions/Progress Updates:  Skilled OT session completed to address IADL retraining and functional reach. Pt received seated in WC, agreeable to participate in therapy. Pt reports no pain.  Pt dependently propelled to ADL apt, pt endorsing priority to manage meals in home to assist with child care. OT provided education and demo on functional mobility in kitchen with RW emphasizing side-stepping to manage cabinets and refrigerator door. Pt returned demo with CGA for RW mgmt when opening sliding drawers and refrigerator door as pt attempts to open with RW in front of items. OT provided VC for positioning and prompting to side-step. Pt required one seated rest break, repeated trial to retrieve 5 items in kitchen, pt required VC when opening overhead cabinets to ensure RW faces counter. Pt instructed on transferring pots/pans across counter to reduce risk for burns/falls, pt verbalizing preference to carry pot with one hand and RW in other. Pt attempts trial with Min A for balance. OT currently recommending transferring across counters. OT provided RW bag to assist with transferring items in kitchen. Pt dependently propelled to main gym. Pt completed functional reach activity with reacher at mirror to retrieve 5/5 cones to increase independence with IADLs. Difficulty noted in LUE when attempting to retrieve over midline.  Pt dependently propelled to dayroom direct handoff to PT.    Therapy  Documentation Precautions:  Precautions Precautions: Fall Recall of Precautions/Restrictions: Intact Precaution/Restrictions Comments: cervical Restrictions Weight Bearing Restrictions Per Provider Order: No  Therapy/Group: Individual Therapy  Renata Gambino Woods-Chance, MS, OTR/L 02/03/2025, 8:03 AM

## 2025-02-03 NOTE — Progress Notes (Signed)
 Occupational Therapy Session Note  Patient Details  Name: April Warren MRN: [id number] Date of Birth: 1970/12/31  Today's Date: 02/03/2025 OT Individual Time: 9154-9059 OT Individual Time Calculation (min): 55 min    Short Term Goals: Week 2:  OT Short Term Goal 1 (Week 2): Patient to increase AROM into R shoulder flexion to 150 degrees OT Short Term Goal 1 - Progress (Week 2): Progressing toward goal OT Short Term Goal 2 (Week 2): patient will complete toileting 3/3 CGA OT Short Term Goal 2 - Progress (Week 2): Progressing toward goal OT Short Term Goal 3 (Week 2): patient will complete LB bathing UB/LB SUP A OT Short Term Goal 3 - Progress (Week 2): Progressing toward goal Week 3:  OT Short Term Goal 1 (Week 3): patient will complete toileting 3/3 CGA OT Short Term Goal 2 (Week 3): patient will complete LB bathing UB/LB SUP A OT Short Term Goal 3 (Week 3): Patient to increase AROM into R shoulder flexion to 150 degrees  Skilled Therapeutic Interventions/Progress Updates:    Pt on BSC at time of session finishing up with nursing. Once finished, focus of session in room wheelchair level at sink for oral hygiene and face washing with pt dropping item 1x but able to recover. OT assisting with braiding hair after pt brushing hair without assist. Pt transported room <> gym for time and in gym, focused on BUE FMC/GMC with focus on LUE for clip tree yellow - green (unable to do blue and black) on a vertical surface. Switching to seated towel slides for additional shoulder flexion, 2x10 shoulder flexion. 1# dowel anterior raises for additional ROM to tolerance and within precautions with OT providing gentle overpressure. OT also providing soft tissue massage along rhomboids/traps at points of tenderness this date - did not rate pain but described as tight. Back in room set up call bell in reach all needs met.   Therapy Documentation Precautions:  Precautions Precautions: Fall Recall of  Precautions/Restrictions: Intact Precaution/Restrictions Comments: cervical Restrictions Weight Bearing Restrictions Per Provider Order: No    Therapy/Group: Individual Therapy  Chiquita JAYSON Hopping 02/03/2025, 7:24 AM

## 2025-02-20 ENCOUNTER — Inpatient Hospital Stay
